# Patient Record
Sex: Male | Born: 1935 | Race: White | Hispanic: No | Marital: Married | Smoking: Former smoker
Health system: Southern US, Community
[De-identification: ages and names within clinical notes are randomized; demographics above are authoritative.]

## PROBLEM LIST (undated history)

## (undated) DIAGNOSIS — K219 Gastro-esophageal reflux disease without esophagitis: Secondary | ICD-10-CM

## (undated) DIAGNOSIS — H3530 Unspecified macular degeneration: Secondary | ICD-10-CM

## (undated) DIAGNOSIS — C3490 Malignant neoplasm of unspecified part of unspecified bronchus or lung: Secondary | ICD-10-CM

## (undated) DIAGNOSIS — F419 Anxiety disorder, unspecified: Secondary | ICD-10-CM

## (undated) DIAGNOSIS — IMO0001 Reserved for inherently not codable concepts without codable children: Secondary | ICD-10-CM

## (undated) DIAGNOSIS — C419 Malignant neoplasm of bone and articular cartilage, unspecified: Secondary | ICD-10-CM

## (undated) DIAGNOSIS — I10 Essential (primary) hypertension: Secondary | ICD-10-CM

## (undated) DIAGNOSIS — E119 Type 2 diabetes mellitus without complications: Secondary | ICD-10-CM

## (undated) HISTORY — DX: Essential (primary) hypertension: I10

---

## 1975-12-19 HISTORY — PX: VEIN LIGATION AND STRIPPING: SHX2653

## 2000-01-19 ENCOUNTER — Encounter: Admission: RE | Admit: 2000-01-19 | Discharge: 2000-01-19 | Payer: Self-pay | Admitting: Internal Medicine

## 2000-01-19 ENCOUNTER — Encounter: Payer: Self-pay | Admitting: Internal Medicine

## 2000-07-31 ENCOUNTER — Encounter: Admission: RE | Admit: 2000-07-31 | Discharge: 2000-07-31 | Payer: Self-pay | Admitting: Internal Medicine

## 2000-07-31 ENCOUNTER — Encounter: Payer: Self-pay | Admitting: Internal Medicine

## 2014-12-15 ENCOUNTER — Ambulatory Visit
Admission: RE | Admit: 2014-12-15 | Discharge: 2014-12-15 | Disposition: A | Discharge status: Home / Self Care | Payer: Medicare Other | Source: Ambulatory Visit | Attending: Family Medicine | Admitting: Family Medicine

## 2014-12-15 ENCOUNTER — Other Ambulatory Visit: Payer: Self-pay | Admitting: Family Medicine

## 2014-12-15 DIAGNOSIS — R918 Other nonspecific abnormal finding of lung field: Secondary | ICD-10-CM

## 2014-12-15 DIAGNOSIS — R042 Hemoptysis: Secondary | ICD-10-CM

## 2014-12-16 ENCOUNTER — Ambulatory Visit
Admission: RE | Admit: 2014-12-16 | Discharge: 2014-12-16 | Disposition: A | Discharge status: Home / Self Care | Payer: Medicare Other | Source: Ambulatory Visit | Attending: Family Medicine | Admitting: Family Medicine

## 2014-12-16 DIAGNOSIS — R918 Other nonspecific abnormal finding of lung field: Secondary | ICD-10-CM

## 2014-12-16 MED ORDER — IOHEXOL 300 MG/ML  SOLN
75.0000 mL | Freq: Once | INTRAMUSCULAR | Status: AC | PRN
Start: 2014-12-16 — End: 2014-12-16
  Administered 2014-12-16: 75 mL via INTRAVENOUS

## 2014-12-17 ENCOUNTER — Telehealth: Payer: Self-pay | Admitting: *Deleted

## 2014-12-17 ENCOUNTER — Encounter: Payer: Self-pay | Admitting: *Deleted

## 2014-12-17 NOTE — [facility] Oncology Navigator Note (Unsigned)
Received referral on Omar Peters today.  I called referring office to request notes and PET scan.  Patient is unaware of referral at this time.  I stated to receptionist that I will call patient on Monday and give their office to call him and order PET scan.  She verbalized understanding and will inform Dr. Rex Kras

## 2014-12-21 ENCOUNTER — Telehealth: Payer: Self-pay | Admitting: *Deleted

## 2014-12-21 ENCOUNTER — Encounter: Payer: Self-pay | Admitting: *Deleted

## 2014-12-21 ENCOUNTER — Other Ambulatory Visit: Payer: Self-pay | Admitting: *Deleted

## 2014-12-21 ENCOUNTER — Other Ambulatory Visit (HOSPITAL_COMMUNITY): Payer: Self-pay | Admitting: Family Medicine

## 2014-12-21 DIAGNOSIS — J984 Other disorders of lung: Secondary | ICD-10-CM | POA: Insufficient documentation

## 2014-12-21 DIAGNOSIS — C3411 Malignant neoplasm of upper lobe, right bronchus or lung: Secondary | ICD-10-CM

## 2014-12-21 NOTE — [facility] Oncology Navigator Note (Unsigned)
Called referring office again today.  I asked if they have spoken with the patient regarding referral, they have not.  I asked if PET scan was ordered.  Apparently, she fax order and percert number to our fax.  I spoke with Tiffany, she is looking for information.

## 2014-12-21 NOTE — Telephone Encounter (Signed)
Received fax from referring office for order for PET scan.  I went to radiology dept to schedule.  I was unable.  I called central scheduling to schedule.  I was able.  I faxed order to [id number] per their request.  I called patient and spoke with both Omar and [name].  I gave them appt time for PET and NPO 6 hours prior instructions.  PET arrival time is 10:45.  I also gave them an appt for the thoracic clinic on 12/31/14 arrival 12:15.  They both verbalized understanding of appt time and place.

## 2014-12-22 ENCOUNTER — Other Ambulatory Visit: Payer: Self-pay | Admitting: *Deleted

## 2014-12-22 DIAGNOSIS — J984 Other disorders of lung: Secondary | ICD-10-CM

## 2014-12-29 ENCOUNTER — Encounter: Payer: Self-pay | Admitting: *Deleted

## 2014-12-29 ENCOUNTER — Ambulatory Visit (HOSPITAL_COMMUNITY)
Admission: RE | Admit: 2014-12-29 | Discharge: 2014-12-29 | Disposition: A | Discharge status: Home / Self Care | Payer: Medicare Other | Source: Ambulatory Visit | Attending: Family Medicine | Admitting: Family Medicine

## 2014-12-29 ENCOUNTER — Telehealth: Payer: Self-pay | Admitting: *Deleted

## 2014-12-29 DIAGNOSIS — R059 Cough, unspecified: Secondary | ICD-10-CM | POA: Insufficient documentation

## 2014-12-29 DIAGNOSIS — C3411 Malignant neoplasm of upper lobe, right bronchus or lung: Secondary | ICD-10-CM

## 2014-12-29 DIAGNOSIS — R05 Cough: Secondary | ICD-10-CM | POA: Insufficient documentation

## 2014-12-29 DIAGNOSIS — C3491 Malignant neoplasm of unspecified part of right bronchus or lung: Secondary | ICD-10-CM | POA: Diagnosis not present

## 2014-12-29 DIAGNOSIS — R0602 Shortness of breath: Secondary | ICD-10-CM | POA: Insufficient documentation

## 2014-12-29 DIAGNOSIS — J90 Pleural effusion, not elsewhere classified: Secondary | ICD-10-CM | POA: Insufficient documentation

## 2014-12-29 DIAGNOSIS — C7951 Secondary malignant neoplasm of bone: Secondary | ICD-10-CM | POA: Insufficient documentation

## 2014-12-29 LAB — GLUCOSE, CAPILLARY: GLUCOSE-CAPILLARY: 214 mg/dL — AB (ref 70–99)

## 2014-12-29 MED ORDER — FLUDEOXYGLUCOSE F - 18 (FDG) INJECTION
8.3700 | Freq: Once | INTRAVENOUS | Status: AC | PRN
  Administered 2014-12-29: 8.37 via INTRAVENOUS

## 2014-12-29 NOTE — Telephone Encounter (Signed)
I had requested records form referring office.  I only obtained one sheet of information.  I called back to referring office and still have not received any more information.  I called patient to get information for abstraction.  He and his wife were very helpful.  I reminded him of appt on Thursday.  Both patient and wife verbalized understanding of appt time and place.

## 2014-12-31 ENCOUNTER — Encounter: Payer: Self-pay | Admitting: Internal Medicine

## 2014-12-31 ENCOUNTER — Encounter: Payer: Self-pay | Admitting: *Deleted

## 2014-12-31 ENCOUNTER — Ambulatory Visit
Admission: RE | Admit: 2014-12-31 | Discharge: 2014-12-31 | Disposition: A | Discharge status: Home / Self Care | Payer: Medicare Other | Source: Ambulatory Visit | Attending: Radiation Oncology | Admitting: Radiation Oncology

## 2014-12-31 ENCOUNTER — Other Ambulatory Visit (HOSPITAL_BASED_OUTPATIENT_CLINIC_OR_DEPARTMENT_OTHER): Payer: Medicare Other

## 2014-12-31 ENCOUNTER — Other Ambulatory Visit: Payer: Self-pay | Admitting: *Deleted

## 2014-12-31 ENCOUNTER — Other Ambulatory Visit: Payer: Self-pay

## 2014-12-31 ENCOUNTER — Ambulatory Visit (HOSPITAL_BASED_OUTPATIENT_CLINIC_OR_DEPARTMENT_OTHER): Payer: Medicare Other | Admitting: Internal Medicine

## 2014-12-31 ENCOUNTER — Ambulatory Visit: Payer: Medicare Other | Admitting: Physical Therapy

## 2014-12-31 ENCOUNTER — Ambulatory Visit (INDEPENDENT_AMBULATORY_CARE_PROVIDER_SITE_OTHER): Payer: Medicare Other | Admitting: Cardiothoracic Surgery

## 2014-12-31 VITALS — BP 143/63 | HR 80 | Temp 97.9°F | Resp 18 | Wt 171.1 lb

## 2014-12-31 VITALS — BP 143/63 | HR 80 | Temp 97.9°F | Resp 18 | Ht 66.0 in | Wt 171.1 lb

## 2014-12-31 DIAGNOSIS — J984 Other disorders of lung: Secondary | ICD-10-CM

## 2014-12-31 DIAGNOSIS — R918 Other nonspecific abnormal finding of lung field: Secondary | ICD-10-CM

## 2014-12-31 DIAGNOSIS — J90 Pleural effusion, not elsewhere classified: Secondary | ICD-10-CM

## 2014-12-31 DIAGNOSIS — C3411 Malignant neoplasm of upper lobe, right bronchus or lung: Secondary | ICD-10-CM

## 2014-12-31 DIAGNOSIS — C7951 Secondary malignant neoplasm of bone: Secondary | ICD-10-CM

## 2014-12-31 DIAGNOSIS — R1909 Other intra-abdominal and pelvic swelling, mass and lump: Secondary | ICD-10-CM

## 2014-12-31 DIAGNOSIS — I10 Essential (primary) hypertension: Secondary | ICD-10-CM

## 2014-12-31 LAB — COMPREHENSIVE METABOLIC PANEL (CC13)
ALT: 35 U/L (ref 0–55)
AST: 23 U/L (ref 5–34)
Albumin: 3.3 g/dL — ABNORMAL LOW (ref 3.5–5.0)
Alkaline Phosphatase: 96 U/L (ref 40–150)
Anion Gap: 9 mEq/L (ref 3–11)
BILIRUBIN TOTAL: 0.66 mg/dL (ref 0.20–1.20)
BUN: 16.4 mg/dL (ref 7.0–26.0)
CO2: 30 meq/L — AB (ref 22–29)
CREATININE: 1.1 mg/dL (ref 0.7–1.3)
Calcium: 9.1 mg/dL (ref 8.4–10.4)
Chloride: 98 mEq/L (ref 98–109)
EGFR: 65 mL/min/{1.73_m2} — ABNORMAL LOW (ref >90)
GLUCOSE: 240 mg/dL — AB (ref 70–140)
Potassium: 3.7 mEq/L (ref 3.5–5.1)
Sodium: 138 mEq/L (ref 136–145)
Total Protein: 6.9 g/dL (ref 6.4–8.3)

## 2014-12-31 LAB — CBC WITH DIFFERENTIAL/PLATELET
BASO%: 0.9 % (ref 0.0–2.0)
Basophils Absolute: 0.1 10*3/uL (ref 0.0–0.1)
EOS%: 1 % (ref 0.0–7.0)
Eosinophils Absolute: 0.1 10*3/uL (ref 0.0–0.5)
HCT: 49.9 % (ref 38.4–49.9)
HGB: 16.6 g/dL (ref 13.0–17.1)
LYMPH#: 2 10*3/uL (ref 0.9–3.3)
LYMPH%: 19 % (ref 14.0–49.0)
MCH: 34.5 pg — ABNORMAL HIGH (ref 27.2–33.4)
MCHC: 33.3 g/dL (ref 32.0–36.0)
MCV: 103.5 fL — ABNORMAL HIGH (ref 79.3–98.0)
MONO#: 1.2 10*3/uL — ABNORMAL HIGH (ref 0.1–0.9)
MONO%: 11.8 % (ref 0.0–14.0)
NEUT%: 67.3 % (ref 39.0–75.0)
NEUTROS ABS: 6.9 10*3/uL — AB (ref 1.5–6.5)
Platelets: 239 10*3/uL (ref 140–400)
RBC: 4.82 10*6/uL (ref 4.20–5.82)
RDW: 12.4 % (ref 11.0–14.6)
WBC: 10.3 10*3/uL (ref 4.0–10.3)

## 2014-12-31 NOTE — Progress Notes (Signed)
[facility] Telephone:(336) [phone]   Fax:(336) [phone] Multidisciplinary thoracic oncology clinic  CONSULT NOTE  REFERRING PHYSICIAN: Dr. Tamsen Roers.  REASON FOR CONSULTATION:  80 years old white male with questionable metastatic lung cancer  HPI Omar Peters is a 80 y.o. male with past medical history significant for hypertension and macular degeneration as well as long history of smoking but quit 3 weeks ago. The patient mentions that he started having cough for spells 3 weeks ago and pain in the chest after the cough as well as blood tinged sputum. He was seen by his primary care physician Dr. Rex Kras and chest x-ray was performed on 12/15/2014. It showed right hilar mass and/or infiltrate with possible mediastinal adenopathy.Right base atelectasis. Prominent right apical pleural thickening. These findings together are worrisome for malignancy and contrast-enhanced chest CT suggested to further evaluate. CT scan of the chest with contrast was performed on 12/16/2014 and it showed Large right upper lobe mass in the perihilar region contiguous with adjacent right hilar lymphadenopathy. This mass is difficult to discretely measure separate from the adjacent postobstructive lung consolidation/collapse, but is estimated to measure approximately 9.9 x 6.2 x 7.8 cm. there was also Right hilar lymphadenopathy contiguous with the right upper lobe mass. Right paratracheal lymphadenopathy measuring up to 12 mm in short axis in the lower right paratracheal station. Subcarinal lymphadenopathy measuring up to 12 mm in short axis. Right-sided superior mediastinal lymph node lateral to the proximal esophagus measuring 11 mm in short axis. AP window lymph node measuring 11 mm in short axis. These findings were highly concerning for primary right upper lobe bronchogenic carcinoma with right hilar and bilateral mediastinal lymphadenopathy as well as probable malignant right-sided pleural  effusion. A PET scan was performed on 12/29/2014 and it showed hypermetabolic right upper lobe mass consistent with primary bronchogenic carcinoma in addition to hypermetabolic mediastinal nodal metastasis and large right pleural effusion in addition to hypermetabolic skeletal metastases involving the right scapula and right iliac bone.  Dr. Rex Kras kindly referred the patient to me today for evaluation and recommendation regarding his condition.  When seen today he continues to have cough productive of whitish sputum as well as shortness of breath at baseline and increased with exertion. He also complains of right-sided chest pain. No significant weight loss or night sweats. The patient denied having any nausea or vomiting, diarrhea or constipation. He denied having any headache or visual changes. Family history significant for mother who died at age 27 with a stroke and father died at age 13 with pancreatic cancer. The patient had to Brother with lung cancer one died at age 85 and the other one at age 76. The patient is married and has 4 children. He was accompanied today by his wife during. 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 The patient has a history of smoking more than one pack per day for around 50 years but quit 3 weeks ago. He also drinks alcohol on daily basis. No history of drug abuse.  HPI  Past Medical History  Diagnosis Date  . Hypertension     History reviewed. No pertinent past surgical history.  Family History  Problem Relation Age of Onset  . Stroke Mother   . Cancer Father   . Cancer Brother     Social History History  Substance Use Topics  . Smoking status: Former Smoker -- 1.00 packs/day for 50 years    Quit date: 12/14/2014  .  Smokeless tobacco: Former Systems developer    Quit date: 12/14/2014  . Alcohol Use: Not on file    No Known Allergies  Current Outpatient Prescriptions  Medication Sig Dispense Refill  . amLODipine (NORVASC) 10 MG  tablet Take 10 mg by mouth daily.    Marland Kitchen aspirin 81 MG tablet Take 81 mg by mouth daily.    . cholecalciferol (VITAMIN D) 1000 UNITS tablet Take 1,000 Units by mouth daily.    Marland Kitchen dextromethorphan (DELSYM) 30 MG/5ML liquid Take 15 mg by mouth as needed for cough.    . doxycycline (ADOXA) 50 MG tablet Take 50 mg by mouth 2 (two) times daily.    Marland Kitchen losartan-hydrochlorothiazide (HYZAAR) 100-25 MG per tablet Take 1 tablet by mouth daily.    . metoprolol (LOPRESSOR) 50 MG tablet Take 50 mg by mouth 2 (two) times daily.    . Multiple Vitamins-Minerals (PRESERVISION AREDS PO) Take 1 tablet by mouth daily.    . ranitidine (ZANTAC) 150 MG tablet Take 150 mg by mouth daily.    . vitamin C (ASCORBIC ACID) 500 MG tablet Take 500 mg by mouth daily.     No current facility-administered medications for this visit.    Review of Systems  Constitutional: positive for fatigue Eyes: negative Ears, nose, mouth, throat, and face: negative Respiratory: positive for cough, dyspnea on exertion and pleurisy/chest pain Cardiovascular: negative Gastrointestinal: negative Genitourinary:negative Integument/breast: negative Hematologic/lymphatic: negative Musculoskeletal:negative Neurological: negative Behavioral/Psych: negative Endocrine: negative Allergic/Immunologic: negative  Physical Exam  [id number], healthy, no distress, well nourished, well developed and anxious SKIN: skin color, texture, turgor are normal, no rashes or significant lesions HEAD: Normocephalic, No masses, lesions, tenderness or abnormalities EYES: normal, PERRLA EARS: External ears normal, Canals clear OROPHARYNX:no exudate, no erythema and lips, buccal mucosa, and tongue normal  NECK: supple, no adenopathy, no JVD LYMPH:  no palpable lymphadenopathy, no hepatosplenomegaly LUNGS: decreased breath sounds, expiratory wheezes bilaterally HEART: regular rate & rhythm, no murmurs and no gallops ABDOMEN:abdomen soft, non-tender, normal bowel  sounds and no masses or organomegaly BACK: Back symmetric, no curvature., No CVA tenderness EXTREMITIES:no joint deformities, effusion, or inflammation, no edema, no skin discoloration  NEURO: alert & oriented x 3 with fluent speech, no focal motor/sensory deficits  PERFORMANCE STATUS: ECOG 1  LABORATORY DATA: Lab Results  Component Value Date   WBC 10.3 12/31/2014   HGB 16.6 12/31/2014   HCT 49.9 12/31/2014   MCV 103.5* 12/31/2014   PLT 239 12/31/2014      Chemistry      Component Value Date/Time   NA 138 12/31/2014 1229   K 3.7 12/31/2014 1229   CO2 30* 12/31/2014 1229   BUN 16.4 12/31/2014 1229   CREATININE 1.1 12/31/2014 1229      Component Value Date/Time   CALCIUM 9.1 12/31/2014 1229   ALKPHOS 96 12/31/2014 1229   AST 23 12/31/2014 1229   ALT 35 12/31/2014 1229   BILITOT 0.66 12/31/2014 1229       RADIOGRAPHIC STUDIES: Dg Chest 2 View  12/15/2014   CLINICAL DATA:  Shortness of breath and cough.  EXAM: CHEST  2 VIEW  COMPARISON:  None.  FINDINGS: Right perihilar mass and/or infiltrate. Associated prominence of the mediastinum suggesting adenopathy. Right base atelectasis. Right apical pleural thickening. These findings together worrisome for malignancy. Contrast-enhanced chest CT suggested to further evaluate. Left lung clear. Heart size normal. No acute bony abnormality.  IMPRESSION: Right perihilar mass and or infiltrate with possible mediastinal adenopathy. Right base atelectasis. Prominent right apical pleural  thickening. These findings together are worrisome for malignancy and contrast-enhanced chest CT suggested to further evaluate. These results will be called to the ordering clinician or representative by the Radiologist Assistant, and communication documented in the PACS or zVision Dashboard.   Electronically Signed   By: Marcello Moores  Register   On: 12/15/2014 13:20   Ct Chest W Contrast  12/16/2014   CLINICAL DATA:  80 year old male with right perihilar mass seen  on chest x-ray. Cough and hemoptysis for the past 10 days. Prior history of smoking, quit 3 weeks ago.  EXAM: CT CHEST WITH CONTRAST  TECHNIQUE: Multidetector CT imaging of the chest was performed during intravenous contrast administration.  CONTRAST:  11mL OMNIPAQUE IOHEXOL 300 MG/ML  SOLN  COMPARISON:  Chest x-ray 12/15/2014.  FINDINGS: BUN and creatinine were obtained on site at [facility] at  [address]  Results:  BUN 14 mg/dL,  Creatinine 0.8 mg/dL.  Mediastinum: Right hilar lymphadenopathy contiguous with the right upper lobe mass. Right paratracheal lymphadenopathy measuring up to 12 mm in short axis in the lower right paratracheal station. Subcarinal lymphadenopathy measuring up to 12 mm in short axis. Right-sided superior mediastinal lymph node lateral to the proximal esophagus measuring 11 mm in short axis. AP window lymph node measuring 11 mm in short axis. No left hilar adenopathy. Heart size is normal. There is no significant pericardial fluid, thickening or pericardial calcification. There is atherosclerosis of the thoracic aorta, the great vessels of the mediastinum and the coronary arteries, including calcified atherosclerotic plaque in the left anterior descending and right coronary arteries.  Lungs/Pleura: Large right upper lobe mass in the perihilar region contiguous with adjacent right hilar lymphadenopathy. This mass is difficult to discretely measure separate from the adjacent postobstructive lung consolidation/collapse, but is estimated to measure approximately 9.9 x 6.2 x 7.8 cm (images 23 of series 2 and coronal image 64). Abnormal nodal tissue is also noted around the right middle lobe bronchi, with some mild postobstructive changes in the right middle lobe and to a lesser extent in the right lower lobe. No definite direct mediastinal invasion noted at this time. Large right-sided pleural effusion, predominantly layering dependently, although there is likely a partially  loculated component in the right apex adjacent to the right upper lobe mass. Background of diffuse bronchial wall thickening with moderate centrilobular and mild paraseptal emphysema.  Upper Abdomen: Shrunken appearance and nodular contour in the liver, with heterogeneous attenuation throughout the parenchyma, compatible with cirrhosis. Atherosclerosis.  Musculoskeletal: There are no aggressive appearing lytic or blastic lesions noted in the visualized portions of the skeleton.  IMPRESSION: 1. Findings, as above, highly concerning for primary right upper lobe bronchogenic carcinoma with right hilar and bilateral mediastinal lymphadenopathy, as well as a probable malignant right-sided pleural effusion. Based on these findings, this is compatible with at least T3, N3, Mx disease (i.e., at least stage IIIB). Correlation with PET-CT and/or biopsy is recommended for further diagnostic and staging purposes. 2. Additional incidental findings, as above. These results will be called to the ordering clinician or representative by the Radiologist Assistant, and communication documented in the PACS or zVision Dashboard.   Electronically Signed   By: Vinnie Langton M.D.   On: 12/16/2014 14:28   Nm Pet Image Initial (pi) Skull Base To Thigh  12/29/2014   CLINICAL DATA:  Initial treatment strategy for right lung mass with mediastinal lymphadenopathy.  EXAM: NUCLEAR MEDICINE PET SKULL BASE TO THIGH  TECHNIQUE: 8.1 mCi F-18 FDG was injected intravenously.  Full-ring PET imaging was performed from the skull base to thigh after the radiotracer. CT data was obtained and used for attenuation correction and anatomic localization.  FASTING BLOOD GLUCOSE:  Value: 214 mg/dl  COMPARISON:  None.  FINDINGS: NECK  No hypermetabolic lymph nodes in the neck.  CHEST  Ill-defined right upper lobe pulmonary mass approximately 5.5 cm with SUV max 4.7. The mass is difficult to define on the background of collapse of the upper lobe. Additionally  there is a large right pleural effusion occupying greater than 50% of the right hemithorax volume.  Hypermetabolic mediastinal lymph nodes involving the right lower paratracheal, subcarinal, and high right paratracheal nodal stations. Example subcarinal lymph node with SUV max 4.8. Small high right paratracheal lymph node with SUV max 3.2. The left lung is clear.  ABDOMEN/PELVIS  No abnormal hypermetabolic activity within the liver, pancreas, adrenal glands, or spleen. No hypermetabolic lymph nodes in the abdomen or pelvis.  SKELETON  There is a expansile lytic lesion within the right posterior iliac bone adjacent the SI joint measuring 3.1 x 2.6 cm and with SUV max 5.4. A second hypermetabolic lytic lesion within the right scapula just posterior to the glenoid fossa (image 55).  IMPRESSION: 1. Hypermetabolic right upper lobe mass consistent with primary bronchogenic carcinoma. 2. Hypermetabolic mediastinal nodal metastasis. 3. Large right pleural effusion. 4. Hypermetabolic skeletal metastasis involving the right scapula and right iliac bone.   Electronically Signed   By: Suzy Bouchard M.D.   On: 12/29/2014 13:11    ASSESSMENT: This is a very pleasant 80 years old white male with highly suspicious metastatic lung cancer, pending tissue diagnosis presented with large right upper lobe lung mass in addition to right hilar and bilateral mediastinal lymphadenopathy as well as a right pleural effusion and metastatic bone disease.   PLAN: I had a lengthy discussion with the patient and his wife today about his current disease stage and possible treatment options. I discussed the option of obtaining tissue diagnosis for this patient including bronchoscopy versus CT-guided core biopsy by interventional radiology. The patient will be seen later today by Dr. Servando Snare for the final decision regarding the best option for his biopsy. He will also be seen by Dr. Servando Snare for management of the right pleural effusion. I  will complete the staging workup by ordering a MRI of the brain to rule out brain metastasis. I will see the patient back for follow-up visit in this in 2 weeks for reevaluation and more detailed discussion of his treatment options based on the final pathology. He will also see [name] later today for evaluation and consideration of palliative radiotherapy to the metastatic bone lesion. The patient was seen during his multidisciplinary thoracic oncology clinic today by medical oncology, radiation oncology, thoracic surgery, thoracic navigator, physical therapist as well as oncology pharmacist. For hypertension he will continue with his current medication with Hyzaar, Norvasc and Lopressor. The patient was advised to call immediately if he has any concerning symptoms in the interval.  The patient voices understanding of current disease status and treatment options and is in agreement with the current care plan.  All questions were answered. The patient knows to call the clinic with any problems, questions or concerns. We can certainly see the patient much sooner if necessary.  Thank you so much for allowing me to participate in the care of Omar Peters. I will continue to follow up the patient with you and assist in his care.  I spent 40  minutes counseling the patient face to face. The total time spent in the appointment was 60 minutes.  Disclaimer: This note was dictated with voice recognition software. Similar sounding words can inadvertently be transcribed and may not be corrected upon review.   Oluwanifemi Petitti K. 12/31/2014, 1:49 PM

## 2014-12-31 NOTE — Progress Notes (Signed)
   Thoracic Treatment Summary Name:Omar Peters Date: 12/31/2014 DOB:[birth year] Your Medical Team Medical Oncologist:Dr. Julien Nordmann Radiation Oncologist:[name] Pulmonologist:  Surgeon:Dr.Gerhardt Type and Stage of Lung Cancer  Unknown histology as of 12/31/2014  Clinical Stage: Stage IV No matching staging information was found for the patient.   Clinical stage is based on radiology exams.  Pathological stage will be determined after surgery.  Staging is based on the size of the tumor, involvement of lymph nodes or not, and whether or not the cancer center has spread. Recommendations Recommendations: Tissue diagnosis, chemo and radiation therapy  These recommendations are based on information available as of today's consult.  This is subject to change depending further testing or exams. Next Steps Next Step: Medical Oncology will set up follow up appointments  Radiation Oncology will call and set up follow up appointments Thoracic Surgery office will call and set up follow up appointments Barriers to Care What do you perceive as a potential barrier that may prevent you from receiving your treatment plan? Education lung cancer information given and explained Support resources at [employer] given and explained Estate manager/land agent spoke with patient to see if patient qualifies for services.  I help patient fill out application and faxed to lung cancer initiative.    Resources Given: NCI Booklet on [employer] at [employer].org [phone] What to expect at [facility] information   Questions Norton Blizzard, RN BSN Thoracic Oncology Nurse Navigator at [facility] is a nurse navigator that is available to assist you through your cancer journey.  She can answer your questions and/or provide resources regarding your treatment plan, emotional support, or financial concerns.

## 2014-12-31 NOTE — Progress Notes (Signed)
Spoke w/ pt with financial concerns.  I informed pt that if his ins doesn't pay 100% for chemo I will contact foundations that offer copay assistance.  I also informed him of the $400 [place] and discussed what the grant will cover.  He will bring me his bank statement to see if he will qualify for that grant.  I gave him my card for any additional billing or insurance questions or concerns.

## 2014-12-31 NOTE — Progress Notes (Signed)
Radiation Oncology         [phone] ________________________________  Multidisciplinary Thoracic Oncology Clinic [facility]) Initial Outpatient Consultation  Name: SARA SELVIDGE MRN: [id number]  Date: 12/31/2014  DOB: 04-30-1935  RS:WNIOEV,OJJKK, MD  Rigoberto Noel, MD   REFERRING PHYSICIAN: Rigoberto Noel, MD  DIAGNOSIS: The primary encounter diagnosis was Bone metastasis. A diagnosis of Cancer of upper lobe of right lung was also pertinent to this visit.    ICD-9-CM ICD-10-CM   1. Bone metastasis 198.5 C79.51   2. Cancer of upper lobe of right lung 162.3 C34.11     HISTORY OF PRESENT ILLNESS::Lamorris D Sheridan is a 80 y.o. male who presented with cough and dyspnea.  Chest x-ray on 12/15/14 showed a right perihilar mass and or infiltrate with possible mediastinal adenopathy     Chest CT on 12/16/14 showed Large right upper lobe mass in the perihilar region contiguous with adjacent right hilar lymphadenopathy. This mass is difficult to discretely measure separate from the adjacent postobstructive lung consolidation/collapse, but is estimated to measure approximately 9.9 x 6.2 x 7.8 cm     PET/CT on 12/29/14 showed a hypermetabolic right upper lobe mass, hypermetabolic mediastinal nodal metastasis, a large right pleural effusion, and hypermetabolic skeletal metastasis involving the right scapula and right iliac bone.    He was referred to the [facility] today for further recommendations.  PREVIOUS RADIATION THERAPY: No  PAST MEDICAL HISTORY:  has a past medical history of Hypertension.    PAST SURGICAL HISTORY:No past surgical history on file.  FAMILY HISTORY: family history includes Cancer in his brother and father; Stroke in his mother.  SOCIAL HISTORY:  reports that he quit smoking about 2 weeks ago. He quit smokeless tobacco use about 2 weeks ago. He reports that he does not use illicit drugs.  ALLERGIES: Review of patient's allergies indicates no known  allergies.  MEDICATIONS:  Current Outpatient Prescriptions  Medication Sig Dispense Refill  . amLODipine (NORVASC) 10 MG tablet Take 10 mg by mouth daily.    Marland Kitchen aspirin 81 MG tablet Take 81 mg by mouth daily.    . cholecalciferol (VITAMIN D) 1000 UNITS tablet Take 1,000 Units by mouth daily.    Marland Kitchen dextromethorphan (DELSYM) 30 MG/5ML liquid Take 15 mg by mouth as needed for cough.    . doxycycline (ADOXA) 50 MG tablet Take 50 mg by mouth 2 (two) times daily.    Marland Kitchen losartan-hydrochlorothiazide (HYZAAR) 100-25 MG per tablet Take 1 tablet by mouth daily.    . metoprolol (LOPRESSOR) 50 MG tablet Take 50 mg by mouth 2 (two) times daily.    . Multiple Vitamins-Minerals (PRESERVISION AREDS PO) Take 1 tablet by mouth daily.    . ranitidine (ZANTAC) 150 MG tablet Take 150 mg by mouth daily.    . vitamin C (ASCORBIC ACID) 500 MG tablet Take 500 mg by mouth daily.     No current facility-administered medications for this encounter.    REVIEW OF SYSTEMS:  A 15 point review of systems is documented in the electronic medical record. This was obtained by the nursing staff. However, I reviewed this with the patient to discuss relevant findings and make appropriate changes.  A comprehensive review of systems was negative.   PHYSICAL EXAM:  BP 143/63 mmHg  Pulse 80  Temp(Src) 97.9 F (36.6 C)  Resp 18  Wt 171 lb 1.6 oz (77.61 kg)  SpO2 94% Per thoracic surgery: General appearance: alert and cooperative Neurologic: intact Heart: regular rate and rhythm,  S1, S2 normal, no murmur, click, rub or gallop Lungs: diminished breath sounds RUL, absent breath sounds at the right base Abdomen: soft, non-tender; bowel sounds normal; no masses, no organomegaly Extremities: extremities normal, atraumatic, no cyanosis or edema and Homans sign is negative, no sign of DVT Patient has no cervical supraclavicular axillary adenopathy He has no carotid bruits , distal pulses 2 DP PT bilaterally are 2+   KPS = 80  100 -  Normal; no complaints; no evidence of disease. 90   - Able to carry on normal activity; minor signs or symptoms of disease. 80   - Normal activity with effort; some signs or symptoms of disease. 33   - Cares for self; unable to carry on normal activity or to do active work. 60   - Requires occasional assistance, but is able to care for most of his personal needs. 50   - Requires considerable assistance and frequent medical care. 49   - Disabled; requires special care and assistance. 24   - Severely disabled; hospital admission is indicated although death not imminent. 92   - Very sick; hospital admission necessary; active supportive treatment necessary. 10   - Moribund; fatal processes progressing rapidly. 0     - Dead  Karnofsky DA, Abelmann Luyando, Craver LS and Burchenal [facility] [phone]) The use of the nitrogen mustards in the palliative treatment of carcinoma: with particular reference to bronchogenic carcinoma Cancer 1 [id number]  LABORATORY DATA:  Lab Results  Component Value Date   WBC 10.3 12/31/2014   HGB 16.6 12/31/2014   HCT 49.9 12/31/2014   MCV 103.5* 12/31/2014   PLT 239 12/31/2014   Lab Results  Component Value Date   NA 138 12/31/2014   K 3.7 12/31/2014   CO2 30* 12/31/2014   Lab Results  Component Value Date   ALT 35 12/31/2014   AST 23 12/31/2014   ALKPHOS 96 12/31/2014   BILITOT 0.66 12/31/2014    PULMONARY FUNCTION TEST:  N/A   RADIOGRAPHY: Dg Chest 1 View  01/01/2015   CLINICAL DATA:  Initial encounter for right thoracentesis  EXAM: CHEST - 1 VIEW  COMPARISON:  12/15/2014  FINDINGS: Right pleural effusion is noted, potentially loculated. No evidence for pneumothorax status post thoracentesis. Collapse/consolidation noted in the right lower lung. Left lung is clear. Imaged bony structures of the thorax are intact.  IMPRESSION: No evidence for pneumothorax   Electronically Signed   By: Misty Stanley M.D.   On: 01/01/2015 13:53   Dg Chest 2 View  12/15/2014   CLINICAL  DATA:  Shortness of breath and cough.  EXAM: CHEST  2 VIEW  COMPARISON:  None.  FINDINGS: Right perihilar mass and/or infiltrate. Associated prominence of the mediastinum suggesting adenopathy. Right base atelectasis. Right apical pleural thickening. These findings together worrisome for malignancy. Contrast-enhanced chest CT suggested to further evaluate. Left lung clear. Heart size normal. No acute bony abnormality.  IMPRESSION: Right perihilar mass and or infiltrate with possible mediastinal adenopathy. Right base atelectasis. Prominent right apical pleural thickening. These findings together are worrisome for malignancy and contrast-enhanced chest CT suggested to further evaluate. These results will be called to the ordering clinician or representative by the Radiologist Assistant, and communication documented in the PACS or zVision Dashboard.   Electronically Signed   By: Marcello Moores  Register   On: 12/15/2014 13:20   Ct Chest W Contrast  12/16/2014   CLINICAL DATA:  80 year old male with right perihilar mass seen on chest x-ray. Cough and hemoptysis  for the past 10 days. Prior history of smoking, quit 3 weeks ago.  EXAM: CT CHEST WITH CONTRAST  TECHNIQUE: Multidetector CT imaging of the chest was performed during intravenous contrast administration.  CONTRAST:  68mL OMNIPAQUE IOHEXOL 300 MG/ML  SOLN  COMPARISON:  Chest x-ray 12/15/2014.  FINDINGS: BUN and creatinine were obtained on site at [facility] at  [address]  Results:  BUN 14 mg/dL,  Creatinine 0.8 mg/dL.  Mediastinum: Right hilar lymphadenopathy contiguous with the right upper lobe mass. Right paratracheal lymphadenopathy measuring up to 12 mm in short axis in the lower right paratracheal station. Subcarinal lymphadenopathy measuring up to 12 mm in short axis. Right-sided superior mediastinal lymph node lateral to the proximal esophagus measuring 11 mm in short axis. AP window lymph node measuring 11 mm in short axis. No left hilar  adenopathy. Heart size is normal. There is no significant pericardial fluid, thickening or pericardial calcification. There is atherosclerosis of the thoracic aorta, the great vessels of the mediastinum and the coronary arteries, including calcified atherosclerotic plaque in the left anterior descending and right coronary arteries.  Lungs/Pleura: Large right upper lobe mass in the perihilar region contiguous with adjacent right hilar lymphadenopathy. This mass is difficult to discretely measure separate from the adjacent postobstructive lung consolidation/collapse, but is estimated to measure approximately 9.9 x 6.2 x 7.8 cm (images 23 of series 2 and coronal image 64). Abnormal nodal tissue is also noted around the right middle lobe bronchi, with some mild postobstructive changes in the right middle lobe and to a lesser extent in the right lower lobe. No definite direct mediastinal invasion noted at this time. Large right-sided pleural effusion, predominantly layering dependently, although there is likely a partially loculated component in the right apex adjacent to the right upper lobe mass. Background of diffuse bronchial wall thickening with moderate centrilobular and mild paraseptal emphysema.  Upper Abdomen: Shrunken appearance and nodular contour in the liver, with heterogeneous attenuation throughout the parenchyma, compatible with cirrhosis. Atherosclerosis.  Musculoskeletal: There are no aggressive appearing lytic or blastic lesions noted in the visualized portions of the skeleton.  IMPRESSION: 1. Findings, as above, highly concerning for primary right upper lobe bronchogenic carcinoma with right hilar and bilateral mediastinal lymphadenopathy, as well as a probable malignant right-sided pleural effusion. Based on these findings, this is compatible with at least T3, N3, Mx disease (i.e., at least stage IIIB). Correlation with PET-CT and/or biopsy is recommended for further diagnostic and staging purposes.  2. Additional incidental findings, as above. These results will be called to the ordering clinician or representative by the Radiologist Assistant, and communication documented in the PACS or zVision Dashboard.   Electronically Signed   By: Vinnie Langton M.D.   On: 12/16/2014 14:28   Nm Pet Image Initial (pi) Skull Base To Thigh  12/29/2014   CLINICAL DATA:  Initial treatment strategy for right lung mass with mediastinal lymphadenopathy.  EXAM: NUCLEAR MEDICINE PET SKULL BASE TO THIGH  TECHNIQUE: 8.1 mCi F-18 FDG was injected intravenously. Full-ring PET imaging was performed from the skull base to thigh after the radiotracer. CT data was obtained and used for attenuation correction and anatomic localization.  FASTING BLOOD GLUCOSE:  Value: 214 mg/dl  COMPARISON:  None.  FINDINGS: NECK  No hypermetabolic lymph nodes in the neck.  CHEST  Ill-defined right upper lobe pulmonary mass approximately 5.5 cm with SUV max 4.7. The mass is difficult to define on the background of collapse of the upper lobe. Additionally  there is a large right pleural effusion occupying greater than 50% of the right hemithorax volume.  Hypermetabolic mediastinal lymph nodes involving the right lower paratracheal, subcarinal, and high right paratracheal nodal stations. Example subcarinal lymph node with SUV max 4.8. Small high right paratracheal lymph node with SUV max 3.2. The left lung is clear.  ABDOMEN/PELVIS  No abnormal hypermetabolic activity within the liver, pancreas, adrenal glands, or spleen. No hypermetabolic lymph nodes in the abdomen or pelvis.  SKELETON  There is a expansile lytic lesion within the right posterior iliac bone adjacent the SI joint measuring 3.1 x 2.6 cm and with SUV max 5.4. A second hypermetabolic lytic lesion within the right scapula just posterior to the glenoid fossa (image 55).  IMPRESSION: 1. Hypermetabolic right upper lobe mass consistent with primary bronchogenic carcinoma. 2. Hypermetabolic  mediastinal nodal metastasis. 3. Large right pleural effusion. 4. Hypermetabolic skeletal metastasis involving the right scapula and right iliac bone.   Electronically Signed   By: Suzy Bouchard M.D.   On: 12/29/2014 13:11   US Thoracentesis Asp Pleural Space W/img Guide  01/01/2015   INDICATION: Symptomatic right sided pleural effusion  EXAM: US THORACENTESIS ASP PLEURAL SPACE W/IMG GUIDE  COMPARISON:  PET 12/29/14.  MEDICATIONS: None  COMPLICATIONS: None immediate  TECHNIQUE: Informed written consent was obtained from the patient after a discussion of the risks, benefits and alternatives to treatment. A timeout was performed prior to the initiation of the procedure.  Initial ultrasound scanning demonstrates a right pleural effusion. The lower chest was prepped and draped in the usual sterile fashion. 1% lidocaine was used for local anesthesia.  Under direct ultrasound guidance, a 19 gauge, 7-cm, Yueh catheter was introduced. An ultrasound image was saved for documentation purposes. The thoracentesis was performed. The catheter was removed and a dressing was applied. The patient tolerated the procedure well without immediate post procedural complication. The patient was escorted to have an upright chest radiograph.  FINDINGS: A total of approximately 900 ml of serous fluid was removed. Requested samples were sent to the laboratory.  IMPRESSION: Successful ultrasound-guided right sided thoracentesis yielding 900 ml of pleural fluid.  Read By:  Tsosie Billing PA-C   Electronically Signed   By: Jacqulynn Cadet M.D.   On: 01/01/2015 14:23      IMPRESSION: This patient is very nice 80 year old gentleman with presumed stage IV primary lung cancer, pending tissue diagnosis. The patient may ultimately benefit from radiotherapy for his postobstructive lung consolidation or for his bone metastases.  PLAN:  The patient will need to proceed with thoracentesis as well as lung biopsy, and brain MRI. I will look  forward to following up with him once these are complete to discuss possible radiation treatment options.  Today, I talked to the patient and family about the findings and work-up thus far.  We discussed the natural history of metastatic lung cancer, in the event that the biopsy confirms this, and general treatment, highlighting the role of radiotherapy in the management.  We discussed the available radiation techniques, and focused on the details of logistics and delivery.  We reviewed the anticipated acute and late sequelae associated with radiation in this setting.  The patient was encouraged to ask questions that I answered to the best of my ability.  I filled out a patient counseling form during our discussion including treatment diagrams.  We retained a copy for our records.  The patient would like to proceed with radiation and will be scheduled for CT simulation.  I spent 30 minutes minutes face to face with the patient and more than 50% of that time was spent in counseling and/or coordination of care.   ------------------------------------------------  Sheral Apley. Tammi Klippel, M.D.

## 2015-01-01 ENCOUNTER — Telehealth: Payer: Self-pay | Admitting: Internal Medicine

## 2015-01-01 ENCOUNTER — Telehealth: Payer: Self-pay | Admitting: *Deleted

## 2015-01-01 ENCOUNTER — Encounter: Payer: Self-pay | Admitting: Cardiothoracic Surgery

## 2015-01-01 ENCOUNTER — Ambulatory Visit (HOSPITAL_COMMUNITY)
Admission: RE | Admit: 2015-01-01 | Discharge: 2015-01-01 | Disposition: A | Discharge status: Home / Self Care | Payer: Medicare Other | Source: Ambulatory Visit | Attending: Radiology | Admitting: Radiology

## 2015-01-01 ENCOUNTER — Ambulatory Visit (HOSPITAL_COMMUNITY)
Admission: RE | Admit: 2015-01-01 | Discharge: 2015-01-01 | Disposition: A | Discharge status: Home / Self Care | Payer: Medicare Other | Source: Ambulatory Visit | Attending: Cardiothoracic Surgery | Admitting: Cardiothoracic Surgery

## 2015-01-01 DIAGNOSIS — J90 Pleural effusion, not elsewhere classified: Secondary | ICD-10-CM | POA: Diagnosis present

## 2015-01-01 DIAGNOSIS — Z9889 Other specified postprocedural states: Secondary | ICD-10-CM

## 2015-01-01 NOTE — Telephone Encounter (Signed)
s.w pt wife and advised on Jan appts....Marland Kitchenok and aware

## 2015-01-01 NOTE — Progress Notes (Signed)
[address]       [place] [address]             [phone]                    Giordano D Hirschman Quentin Medical Record #[id number] Date of Birth: 05-24-1935  Referring: Curt Bears, MD Primary Care: Tamsen Roers, MD  Chief Complaint:   Lung mass   History of Present Illness:    Omar Peters 80 y.o. male is seen in the office  today for for what appears to be advanced stage IV carcinoma the lung but without definitive tissue diagnosis the patient has been a long-term smoker more than 50 years. 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 Several weeks ago he developed increasing cough with hemoptysis. This led to a chest x-ray and CT scan of the chest. A PET scan has been performed.  The patient notes that currently his breathing has improved he's had no further hemoptysis. He denies any bony pain.     Current Activity/ Functional Status:  Patient is independent with mobility/ambulation, transfers, ADL's, IADL's.   Zubrod Score: At the time of surgery this patient's most appropriate activity status/level should be described as: [x]     0    Normal activity, no symptoms []     1    Restricted in physical strenuous activity but ambulatory, able to do out light work []     2    Ambulatory and capable of self care, unable to do work activities, up and about               >50 % of waking hours                              []     3    Only limited self care, in bed greater than 50% of waking hours []     4    Completely disabled, no self care, confined to bed or chair []     5    Moribund   Past Medical History  Diagnosis Date  . Hypertension     No past surgical history on file.  Family History  Problem Relation Age of Onset  . Stroke Mother   . Cancer Father   . Cancer Brother     History   Social History  . Marital Status: Married    Spouse Name:  N/A    Number of Children: N/A  . Years of Education: N/A   Occupational History  .  work maintenance in [place] . Patient notes exposure to asbestos    Social History Main Topics  . Smoking status: Former Smoker -- 1.00 packs/day for 50 years    Quit date: 12/14/2014  . Smokeless tobacco: Former Systems developer    Quit date: 12/14/2014  . Alcohol Use: Not on file  . Drug Use: No  . Sexual Activity: Not on file     History  Smoking status  . Former Smoker -- 1.00 packs/day for 50 years  . Quit date: 12/14/2014  Smokeless tobacco  . Former Systems developer  . Quit date: 12/14/2014    History  Alcohol Use: Not on file     No Known Allergies  Current Outpatient Prescriptions  Medication Sig Dispense Refill  . amLODipine (  NORVASC) 10 MG tablet Take 10 mg by mouth daily.    Marland Kitchen aspirin 81 MG tablet Take 81 mg by mouth daily.    . cholecalciferol (VITAMIN D) 1000 UNITS tablet Take 1,000 Units by mouth daily.    Marland Kitchen dextromethorphan (DELSYM) 30 MG/5ML liquid Take 15 mg by mouth as needed for cough.    . doxycycline (ADOXA) 50 MG tablet Take 50 mg by mouth 2 (two) times daily.    Marland Kitchen losartan-hydrochlorothiazide (HYZAAR) 100-25 MG per tablet Take 1 tablet by mouth daily.    . metoprolol (LOPRESSOR) 50 MG tablet Take 50 mg by mouth 2 (two) times daily.    . Multiple Vitamins-Minerals (PRESERVISION AREDS PO) Take 1 tablet by mouth daily.    . ranitidine (ZANTAC) 150 MG tablet Take 150 mg by mouth daily.    . vitamin C (ASCORBIC ACID) 500 MG tablet Take 500 mg by mouth daily.     No current facility-administered medications for this visit.     Review of Systems:     Cardiac Review of Systems: Y or N  Chest Pain [  n]  Resting SOB [  n ] Exertional SOB  [ y ]  Orthopnea [ n ]   Pedal Edema [n  ]    Palpitations [ n ] Syncope  [  ]   Presyncope [ n  ]  General Review of Systems: [Y] = yes [  ]=no Constitional: recent weight change [n  ];  Wt loss over the last 3 months [   ]  anorexia [  ]; fatigue [ y ]; nausea [  ]; night sweats [  ]; fever [n  ]; or chills [n  ];          Dental: poor dentition[  ]; Last Dentist visit:   Eye : blurred vision [  ]; diplopia [   ]; vision changes [  ];  Amaurosis fugax[  ]; Resp: cough [ y ];  wheezing[ n ];  hemoptysis[y  ]; shortness of breath[y  ]; paroxysmal nocturnal dyspnea[ y ]; dyspnea on exertion[y  ]; or orthopnea[  ];  GI:  gallstones[  ], vomiting[n  ];  dysphagia[ n ]; melena[ n ];  hematochezia [n  ]; heartburn[  ];   Hx of  Colonoscopy[  ]; GU: kidney stones [  ]; hematuria[  ];   dysuria [  ];  nocturia[  ];  history of     obstruction [  ]; urinary frequency [ n ]             Skin: rash, swelling[  ];, hair loss[  ];  peripheral edema[  ];  or itching[  ]; Musculosketetal: myalgias[  ];  joint swelling[n  ];  joint erythema[ n ];  joint pain[ n ];  back pain[ n;  Heme/Lymph: bruising[  ];  bleeding[  ];  anemia[  ];  Neuro: TIA[  ];  headaches[  ];  stroke[  ];  vertigo[  ];  seizures[ n ];   paresthesias[  ];  difficulty walking[n  ];  Psych:depression[  ]; anxiety[  ];  Endocrine: diabetes[  ];  thyroid dysfunction[  ];  Immunizations: Flu up to date [ n]; Pneumococcal up to date [n  ];  Other:  Physical Exam: BP 143/63 mmHg  Pulse 80  Temp(Src) 97.9 F (36.6 C)  Resp 18  Wt 171 lb 1.6 oz (77.61 kg)  SpO2 94%  PHYSICAL EXAMINATION:  General appearance:  alert and cooperative Neurologic: intact Heart: regular rate and rhythm, S1, S2 normal, no murmur, click, rub or gallop Lungs: diminished breath sounds RUL, absent breath sounds at the right base Abdomen: soft, non-tender; bowel sounds normal; no masses,  no organomegaly Extremities: extremities normal, atraumatic, no cyanosis or edema and Homans sign is negative, no sign of DVT Patient has no cervical supraclavicular axillary adenopathy He has no carotid bruits , distal pulses 2 DP PT bilaterally are 2+    Diagnostic Studies & Laboratory data:      Recent Radiology Findings:   Dg Chest 2 View  12/15/2014   CLINICAL DATA:  Shortness of breath and cough.  EXAM: CHEST  2 VIEW  COMPARISON:  None.  FINDINGS: Right perihilar mass and/or infiltrate. Associated prominence of the mediastinum suggesting adenopathy. Right base atelectasis. Right apical pleural thickening. These findings together worrisome for malignancy. Contrast-enhanced chest CT suggested to further evaluate. Left lung clear. Heart size normal. No acute bony abnormality.  IMPRESSION: Right perihilar mass and or infiltrate with possible mediastinal adenopathy. Right base atelectasis. Prominent right apical pleural thickening. These findings together are worrisome for malignancy and contrast-enhanced chest CT suggested to further evaluate. These results will be called to the ordering clinician or representative by the Radiologist Assistant, and communication documented in the PACS or zVision Dashboard.   Electronically Signed   By: Marcello Moores  Register   On: 12/15/2014 13:20   Ct Chest W Contrast  12/16/2014   CLINICAL DATA:  80 year old male with right perihilar mass seen on chest x-ray. Cough and hemoptysis for the past 10 days. Prior history of smoking, quit 3 weeks ago.  EXAM: CT CHEST WITH CONTRAST  TECHNIQUE: Multidetector CT imaging of the chest was performed during intravenous contrast administration.  CONTRAST:  40mL OMNIPAQUE IOHEXOL 300 MG/ML  SOLN  COMPARISON:  Chest x-ray 12/15/2014.  FINDINGS: BUN and creatinine were obtained on site at [facility] [facility] at  [address]  Results:  BUN 14 mg/dL,  Creatinine 0.8 mg/dL.  Mediastinum: Right hilar lymphadenopathy contiguous with the right upper lobe mass. Right paratracheal lymphadenopathy measuring up to 12 mm in short axis in the lower right paratracheal station. Subcarinal lymphadenopathy measuring up to 12 mm in short axis. Right-sided superior mediastinal lymph node lateral to the proximal esophagus measuring 11 mm in short  axis. AP window lymph node measuring 11 mm in short axis. No left hilar adenopathy. Heart size is normal. There is no significant pericardial fluid, thickening or pericardial calcification. There is atherosclerosis of the thoracic aorta, the great vessels of the mediastinum and the coronary arteries, including calcified atherosclerotic plaque in the left anterior descending and right coronary arteries.  Lungs/Pleura: Large right upper lobe mass in the perihilar region contiguous with adjacent right hilar lymphadenopathy. This mass is difficult to discretely measure separate from the adjacent postobstructive lung consolidation/collapse, but is estimated to measure approximately 9.9 x 6.2 x 7.8 cm (images 23 of series 2 and coronal image 64). Abnormal nodal tissue is also noted around the right middle lobe bronchi, with some mild postobstructive changes in the right middle lobe and to a lesser extent in the right lower lobe. No definite direct mediastinal invasion noted at this time. Large right-sided pleural effusion, predominantly layering dependently, although there is likely a partially loculated component in the right apex adjacent to the right upper lobe mass. Background of diffuse bronchial wall thickening with moderate centrilobular and mild paraseptal emphysema.  Upper Abdomen: Shrunken appearance and nodular contour in  the liver, with heterogeneous attenuation throughout the parenchyma, compatible with cirrhosis. Atherosclerosis.  Musculoskeletal: There are no aggressive appearing lytic or blastic lesions noted in the visualized portions of the skeleton.  IMPRESSION: 1. Findings, as above, highly concerning for primary right upper lobe bronchogenic carcinoma with right hilar and bilateral mediastinal lymphadenopathy, as well as a probable malignant right-sided pleural effusion. Based on these findings, this is compatible with at least T3, N3, Mx disease (i.e., at least stage IIIB). Correlation with PET-CT  and/or biopsy is recommended for further diagnostic and staging purposes. 2. Additional incidental findings, as above. These results will be called to the ordering clinician or representative by the Radiologist Assistant, and communication documented in the PACS or zVision Dashboard.   Electronically Signed   By: Vinnie Langton M.D.   On: 12/16/2014 14:28   Nm Pet Image Initial (pi) Skull Base To Thigh  12/29/2014   CLINICAL DATA:  Initial treatment strategy for right lung mass with mediastinal lymphadenopathy.  EXAM: NUCLEAR MEDICINE PET SKULL BASE TO THIGH  TECHNIQUE: 8.1 mCi F-18 FDG was injected intravenously. Full-ring PET imaging was performed from the skull base to thigh after the radiotracer. CT data was obtained and used for attenuation correction and anatomic localization.  FASTING BLOOD GLUCOSE:  Value: 214 mg/dl  COMPARISON:  None.  FINDINGS: NECK  No hypermetabolic lymph nodes in the neck.  CHEST  Ill-defined right upper lobe pulmonary mass approximately 5.5 cm with SUV max 4.7. The mass is difficult to define on the background of collapse of the upper lobe. Additionally there is a large right pleural effusion occupying greater than 50% of the right hemithorax volume.  Hypermetabolic mediastinal lymph nodes involving the right lower paratracheal, subcarinal, and high right paratracheal nodal stations. Example subcarinal lymph node with SUV max 4.8. Small high right paratracheal lymph node with SUV max 3.2. The left lung is clear.  ABDOMEN/PELVIS  No abnormal hypermetabolic activity within the liver, pancreas, adrenal glands, or spleen. No hypermetabolic lymph nodes in the abdomen or pelvis.  SKELETON  There is a expansile lytic lesion within the right posterior iliac bone adjacent the SI joint measuring 3.1 x 2.6 cm and with SUV max 5.4. A second hypermetabolic lytic lesion within the right scapula just posterior to the glenoid fossa (image 55).  IMPRESSION: 1. Hypermetabolic right upper lobe mass  consistent with primary bronchogenic carcinoma. 2. Hypermetabolic mediastinal nodal metastasis. 3. Large right pleural effusion. 4. Hypermetabolic skeletal metastasis involving the right scapula and right iliac bone.   Electronically Signed   By: Suzy Bouchard M.D.   On: 12/29/2014 13:11      Recent Lab Findings: Lab Results  Component Value Date   WBC 10.3 12/31/2014   HGB 16.6 12/31/2014   HCT 49.9 12/31/2014   PLT 239 12/31/2014   GLUCOSE 240* 12/31/2014   ALT 35 12/31/2014   AST 23 12/31/2014   NA 138 12/31/2014   K 3.7 12/31/2014   CREATININE 1.1 12/31/2014   BUN 16.4 12/31/2014   CO2 30* 12/31/2014      Assessment / Plan:   Clinical stage IV carcinoma the right lung primarily involving the right upper lobe but with mediastinal involvement lung, with bony metastasis and a large right pleural effusion  Possibly malignant  I discussed with the patient and his wife the probable clinical diagnosis, I recommended that we proceed with needle core biopsy of the right iliac metastatic lesion which appears on scans to be total soft tissue replacement of the  bone and should not prove to be difficult for pathology and if appropriate genetic testing can be performed.  We'll proceed also with right thoracentesis to drain the right pleural space dry, the fluid will be sent for cytology. Patient be monitored for recurrence, if he has rapid recurrence of the right pleural fluid we can proceed with placement of Pleurx catheter.  Arrangements for needle biopsy and thoracentesis will be performed and I will see the patient back in the office next week with a follow-up chest x-ray, once the path results are confirmed the patient is ready to proceed with chemotherapy treatment as outlined by Dr. Inda Merlin.   I spent 55 minutes counseling the patient face to face. The total time spent in the appointment was 80 minutes.  Grace Isaac MD      [address] [place]  [address] Office [phone]   Beeper [phone]  01/01/2015 10:54 AM

## 2015-01-01 NOTE — Telephone Encounter (Signed)
Called to follow up from thoracic clinic yesterday.  I left my name and phone number to call if needed.

## 2015-01-01 NOTE — Telephone Encounter (Signed)
lvm for pt regarding to Jan appt....mailed pt appt sched/avs and letter

## 2015-01-01 NOTE — Procedures (Signed)
Successful US guided right thoracentesis. Yielded 900 ml of serous fluid. Pt tolerated procedure well. No immediate complications. Procedure stopped with remaining pleural fluid left behind secondary to patient's pain and coughing.  Specimen was sent for labs. CXR ordered.  Tsosie Billing D PA-C 01/01/2015 1:27 PM

## 2015-01-02 DIAGNOSIS — I10 Essential (primary) hypertension: Secondary | ICD-10-CM | POA: Insufficient documentation

## 2015-01-04 ENCOUNTER — Other Ambulatory Visit: Payer: Self-pay | Admitting: *Deleted

## 2015-01-04 ENCOUNTER — Ambulatory Visit (HOSPITAL_COMMUNITY): Payer: Medicare Other

## 2015-01-05 ENCOUNTER — Encounter (HOSPITAL_COMMUNITY): Payer: Self-pay | Admitting: Pharmacy Technician

## 2015-01-06 ENCOUNTER — Other Ambulatory Visit: Payer: Self-pay | Admitting: Radiology

## 2015-01-07 ENCOUNTER — Encounter (HOSPITAL_COMMUNITY): Payer: Self-pay

## 2015-01-07 ENCOUNTER — Ambulatory Visit (HOSPITAL_COMMUNITY)
Admission: RE | Admit: 2015-01-07 | Discharge: 2015-01-07 | Disposition: A | Discharge status: Home / Self Care | Payer: Medicare Other | Source: Ambulatory Visit | Attending: Cardiothoracic Surgery | Admitting: Cardiothoracic Surgery

## 2015-01-07 DIAGNOSIS — R1903 Right lower quadrant abdominal swelling, mass and lump: Secondary | ICD-10-CM | POA: Insufficient documentation

## 2015-01-07 DIAGNOSIS — R1909 Other intra-abdominal and pelvic swelling, mass and lump: Secondary | ICD-10-CM

## 2015-01-07 LAB — APTT: aPTT: 35 seconds (ref 24–37)

## 2015-01-07 LAB — CBC
HEMATOCRIT: 51.3 % (ref 39.0–52.0)
Hemoglobin: 18.2 g/dL — ABNORMAL HIGH (ref 13.0–17.0)
MCH: 35.4 pg — AB (ref 26.0–34.0)
MCHC: 35.5 g/dL (ref 30.0–36.0)
MCV: 99.8 fL (ref 78.0–100.0)
Platelets: 238 10*3/uL (ref 150–400)
RBC: 5.14 MIL/uL (ref 4.22–5.81)
RDW: 12.5 % (ref 11.5–15.5)
WBC: 9.8 10*3/uL (ref 4.0–10.5)

## 2015-01-07 LAB — PROTIME-INR
INR: 1.16 (ref 0.00–1.49)
Prothrombin Time: 15 seconds (ref 11.6–15.2)

## 2015-01-07 MED ORDER — FENTANYL CITRATE 0.05 MG/ML IJ SOLN
INTRAMUSCULAR | Status: AC | PRN
  Administered 2015-01-07: 50 ug via INTRAVENOUS

## 2015-01-07 MED ORDER — MIDAZOLAM HCL 2 MG/2ML IJ SOLN
INTRAMUSCULAR | Status: AC
  Filled 2015-01-07: qty 4

## 2015-01-07 MED ORDER — MIDAZOLAM HCL 2 MG/2ML IJ SOLN
INTRAMUSCULAR | Status: AC | PRN
  Administered 2015-01-07: 1 mg via INTRAVENOUS

## 2015-01-07 MED ORDER — FENTANYL CITRATE 0.05 MG/ML IJ SOLN
INTRAMUSCULAR | Status: AC
  Filled 2015-01-07: qty 4

## 2015-01-07 MED ORDER — SODIUM CHLORIDE 0.9 % IV SOLN
Freq: Once | INTRAVENOUS | Status: AC
  Administered 2015-01-07: 1000 mL via INTRAVENOUS

## 2015-01-07 MED ORDER — LIDOCAINE HCL 1 % IJ SOLN
INTRAMUSCULAR | Status: AC
  Filled 2015-01-07: qty 20

## 2015-01-07 NOTE — H&P (Signed)
Chief Complaint: New lung ca diagnosis R iliac lesion +PET  Referring Physician(s): Gerhardt,Edward B  History of Present Illness: Omar Peters is a 80 y.o. male  Long time hx smoker Developed cough and hemoptysis CXR abnormal CT scan reveals RUL mass +PET RUL mass; mediastinal LAN; R iliac lesion; R scapular lesion Thoracentesis 01/01/15 + adenocarcinoma Now scheduled for R iliac lesion for further genetic testing and tissue diagnosis   Past Medical History  Diagnosis Date  . Hypertension     History reviewed. No pertinent past surgical history.  Allergies: Review of patient's allergies indicates no known allergies.  Medications: Prior to Admission medications   Medication Sig Start Date End Date Taking? Authorizing Provider  amLODipine (NORVASC) 10 MG tablet Take 10 mg by mouth daily.   Yes Historical Provider, MD  aspirin 81 MG tablet Take 81 mg by mouth daily.   Yes Historical Provider, MD  doxycycline (ADOXA) 50 MG tablet Take 50 mg by mouth daily.    Yes Historical Provider, MD  losartan-hydrochlorothiazide (HYZAAR) 100-25 MG per tablet Take 1 tablet by mouth daily.   Yes Historical Provider, MD  metoprolol (LOPRESSOR) 50 MG tablet Take 50 mg by mouth daily.    Yes Historical Provider, MD  ranitidine (ZANTAC) 150 MG tablet Take 150 mg by mouth daily.   Yes Historical Provider, MD    Family History  Problem Relation Age of Onset  . Stroke Mother   . Cancer Father   . Cancer Brother     History   Social History  . Marital Status: Married    Spouse Name: N/A    Number of Children: N/A  . Years of Education: N/A   Social History Main Topics  . Smoking status: Former Smoker -- 1.00 packs/day for 50 years    Quit date: 12/14/2014  . Smokeless tobacco: Former Systems developer    Quit date: 12/14/2014  . Alcohol Use: None  . Drug Use: No  . Sexual Activity: None   Other Topics Concern  . None   Social History Narrative     Review of Systems: A 12 point  ROS discussed and pertinent positives are indicated in the HPI above.  All other systems are negative.  Review of Systems  Constitutional: Positive for fatigue. Negative for activity change.  Respiratory: Positive for cough and shortness of breath.   Cardiovascular: Positive for chest pain.  Gastrointestinal: Negative for abdominal pain.  Genitourinary: Negative for difficulty urinating.  Musculoskeletal:       Rt hip pain  Neurological: Positive for weakness.  Psychiatric/Behavioral: Negative for behavioral problems and confusion.     Vital Signs: BP 143/66 mmHg  Pulse 79  Temp(Src) 97.5 F (36.4 C) (Oral)  Resp 18  Ht 5\' 6"  (1.676 m)  Wt 77.111 kg (170 lb)  BMI 27.45 kg/m2  SpO2 92%  Physical Exam  Constitutional: He is oriented to person, place, and time.  Cardiovascular: Normal rate and regular rhythm.  Exam reveals no friction rub.   Pulmonary/Chest: Effort normal and breath sounds normal. He has no wheezes.  Abdominal: Soft. Bowel sounds are normal.  Musculoskeletal: Normal range of motion.  Neurological: He is alert and oriented to person, place, and time.  Skin: Skin is warm.  Psychiatric: He has a normal mood and affect. His behavior is normal. Judgment and thought content normal.    Imaging: Dg Chest 1 View  01/01/2015   CLINICAL DATA:  Initial encounter for right thoracentesis  EXAM: CHEST - 1  VIEW  COMPARISON:  12/15/2014  FINDINGS: Right pleural effusion is noted, potentially loculated. No evidence for pneumothorax status post thoracentesis. Collapse/consolidation noted in the right lower lung. Left lung is clear. Imaged bony structures of the thorax are intact.  IMPRESSION: No evidence for pneumothorax   Electronically Signed   By: Misty Stanley M.D.   On: 01/01/2015 13:53   Dg Chest 2 View  12/15/2014   CLINICAL DATA:  Shortness of breath and cough.  EXAM: CHEST  2 VIEW  COMPARISON:  None.  FINDINGS: Right perihilar mass and/or infiltrate. Associated  prominence of the mediastinum suggesting adenopathy. Right base atelectasis. Right apical pleural thickening. These findings together worrisome for malignancy. Contrast-enhanced chest CT suggested to further evaluate. Left lung clear. Heart size normal. No acute bony abnormality.  IMPRESSION: Right perihilar mass and or infiltrate with possible mediastinal adenopathy. Right base atelectasis. Prominent right apical pleural thickening. These findings together are worrisome for malignancy and contrast-enhanced chest CT suggested to further evaluate. These results will be called to the ordering clinician or representative by the Radiologist Assistant, and communication documented in the PACS or zVision Dashboard.   Electronically Signed   By: Marcello Moores  Register   On: 12/15/2014 13:20   Ct Chest W Contrast  12/16/2014   CLINICAL DATA:  80 year old male with right perihilar mass seen on chest x-ray. Cough and hemoptysis for the past 10 days. Prior history of smoking, quit 3 weeks ago.  EXAM: CT CHEST WITH CONTRAST  TECHNIQUE: Multidetector CT imaging of the chest was performed during intravenous contrast administration.  CONTRAST:  61mL OMNIPAQUE IOHEXOL 300 MG/ML  SOLN  COMPARISON:  Chest x-ray 12/15/2014.  FINDINGS: BUN and creatinine were obtained on site at [facility] at  [address]  Results:  BUN 14 mg/dL,  Creatinine 0.8 mg/dL.  Mediastinum: Right hilar lymphadenopathy contiguous with the right upper lobe mass. Right paratracheal lymphadenopathy measuring up to 12 mm in short axis in the lower right paratracheal station. Subcarinal lymphadenopathy measuring up to 12 mm in short axis. Right-sided superior mediastinal lymph node lateral to the proximal esophagus measuring 11 mm in short axis. AP window lymph node measuring 11 mm in short axis. No left hilar adenopathy. Heart size is normal. There is no significant pericardial fluid, thickening or pericardial calcification. There is atherosclerosis  of the thoracic aorta, the great vessels of the mediastinum and the coronary arteries, including calcified atherosclerotic plaque in the left anterior descending and right coronary arteries.  Lungs/Pleura: Large right upper lobe mass in the perihilar region contiguous with adjacent right hilar lymphadenopathy. This mass is difficult to discretely measure separate from the adjacent postobstructive lung consolidation/collapse, but is estimated to measure approximately 9.9 x 6.2 x 7.8 cm (images 23 of series 2 and coronal image 64). Abnormal nodal tissue is also noted around the right middle lobe bronchi, with some mild postobstructive changes in the right middle lobe and to a lesser extent in the right lower lobe. No definite direct mediastinal invasion noted at this time. Large right-sided pleural effusion, predominantly layering dependently, although there is likely a partially loculated component in the right apex adjacent to the right upper lobe mass. Background of diffuse bronchial wall thickening with moderate centrilobular and mild paraseptal emphysema.  Upper Abdomen: Shrunken appearance and nodular contour in the liver, with heterogeneous attenuation throughout the parenchyma, compatible with cirrhosis. Atherosclerosis.  Musculoskeletal: There are no aggressive appearing lytic or blastic lesions noted in the visualized portions of the skeleton.  IMPRESSION:  1. Findings, as above, highly concerning for primary right upper lobe bronchogenic carcinoma with right hilar and bilateral mediastinal lymphadenopathy, as well as a probable malignant right-sided pleural effusion. Based on these findings, this is compatible with at least T3, N3, Mx disease (i.e., at least stage IIIB). Correlation with PET-CT and/or biopsy is recommended for further diagnostic and staging purposes. 2. Additional incidental findings, as above. These results will be called to the ordering clinician or representative by the Radiologist  Assistant, and communication documented in the PACS or zVision Dashboard.   Electronically Signed   By: Vinnie Langton M.D.   On: 12/16/2014 14:28   Nm Pet Image Initial (pi) Skull Base To Thigh  12/29/2014   CLINICAL DATA:  Initial treatment strategy for right lung mass with mediastinal lymphadenopathy.  EXAM: NUCLEAR MEDICINE PET SKULL BASE TO THIGH  TECHNIQUE: 8.1 mCi F-18 FDG was injected intravenously. Full-ring PET imaging was performed from the skull base to thigh after the radiotracer. CT data was obtained and used for attenuation correction and anatomic localization.  FASTING BLOOD GLUCOSE:  Value: 214 mg/dl  COMPARISON:  None.  FINDINGS: NECK  No hypermetabolic lymph nodes in the neck.  CHEST  Ill-defined right upper lobe pulmonary mass approximately 5.5 cm with SUV max 4.7. The mass is difficult to define on the background of collapse of the upper lobe. Additionally there is a large right pleural effusion occupying greater than 50% of the right hemithorax volume.  Hypermetabolic mediastinal lymph nodes involving the right lower paratracheal, subcarinal, and high right paratracheal nodal stations. Example subcarinal lymph node with SUV max 4.8. Small high right paratracheal lymph node with SUV max 3.2. The left lung is clear.  ABDOMEN/PELVIS  No abnormal hypermetabolic activity within the liver, pancreas, adrenal glands, or spleen. No hypermetabolic lymph nodes in the abdomen or pelvis.  SKELETON  There is a expansile lytic lesion within the right posterior iliac bone adjacent the SI joint measuring 3.1 x 2.6 cm and with SUV max 5.4. A second hypermetabolic lytic lesion within the right scapula just posterior to the glenoid fossa (image 55).  IMPRESSION: 1. Hypermetabolic right upper lobe mass consistent with primary bronchogenic carcinoma. 2. Hypermetabolic mediastinal nodal metastasis. 3. Large right pleural effusion. 4. Hypermetabolic skeletal metastasis involving the right scapula and right iliac  bone.   Electronically Signed   By: Suzy Bouchard M.D.   On: 12/29/2014 13:11   US Thoracentesis Asp Pleural Space W/img Guide  01/01/2015   INDICATION: Symptomatic right sided pleural effusion  EXAM: US THORACENTESIS ASP PLEURAL SPACE W/IMG GUIDE  COMPARISON:  PET 12/29/14.  MEDICATIONS: None  COMPLICATIONS: None immediate  TECHNIQUE: Informed written consent was obtained from the patient after a discussion of the risks, benefits and alternatives to treatment. A timeout was performed prior to the initiation of the procedure.  Initial ultrasound scanning demonstrates a right pleural effusion. The lower chest was prepped and draped in the usual sterile fashion. 1% lidocaine was used for local anesthesia.  Under direct ultrasound guidance, a 19 gauge, 7-cm, Yueh catheter was introduced. An ultrasound image was saved for documentation purposes. The thoracentesis was performed. The catheter was removed and a dressing was applied. The patient tolerated the procedure well without immediate post procedural complication. The patient was escorted to have an upright chest radiograph.  FINDINGS: A total of approximately 900 ml of serous fluid was removed. Requested samples were sent to the laboratory.  IMPRESSION: Successful ultrasound-guided right sided thoracentesis yielding 900 ml of pleural  fluid.  Read By:  Tsosie Billing PA-C   Electronically Signed   By: Jacqulynn Cadet M.D.   On: 01/01/2015 14:23    Labs:  CBC:  Recent Labs  12/31/14 1229 01/07/15 0724  WBC 10.3 9.8  HGB 16.6 18.2*  HCT 49.9 51.3  PLT 239 238    COAGS:  Recent Labs  01/07/15 0724  INR 1.16  APTT 35    BMP:  Recent Labs  12/31/14 1229  NA 138  K 3.7  CO2 30*  GLUCOSE 240*  BUN 16.4  CALCIUM 9.1  CREATININE 1.1    LIVER FUNCTION TESTS:  Recent Labs  12/31/14 1229  BILITOT 0.66  AST 23  ALT 35  ALKPHOS 96  PROT 6.9  ALBUMIN 3.3*    TUMOR MARKERS: No results for input(s): AFPTM, CEA, CA199,  CHROMGRNA in the last 8760 hours.  Assessment and Plan:  New dx Lung cancer +PET R iliac lesion Now scheduled for Rt iliac lesion bx for tissue and genetic testing Pt aware of procedure benefits and risks and agreeable to proceed Consent signed andin chart   Thank you for this interesting consult.  I greatly enjoyed meeting Omar Peters and look forward to participating in their care.     I spent a total of 20 minutes face to face in clinical consultation, greater than 50% of which was counseling/coordinating care for R iliac lesion bx  Signed: Lorenz Donley A 01/07/2015, 9:01 AM

## 2015-01-07 NOTE — Procedures (Signed)
R iliac bone lesion Bx No comp

## 2015-01-07 NOTE — Discharge Instructions (Signed)
Bone Biopsy, Needle, Care After Read the instructions outlined below and refer to this sheet in the next few weeks. These discharge instructions provide you with general information on caring for yourself after you leave the hospital. Your caregiver may also give you specific instructions. While your treatment has been planned according to the most current medical practices available, unavoidable complications sometimes occur. If you have any problems or questions after discharge, call your caregiver. Finding out the results of your test Not all test results are available during your visit. If your test results are not back during the visit, make an appointment with your caregiver to find out the results. Do not assume everything is normal if you have not heard from your caregiver or the medical facility. It is important for you to follow up on all of your test results.  SEEK MEDICAL CARE IF:   You have redness, swelling, or increasing pain at the site of the biopsy.  You have pus coming from the biopsy site.  You have drainage from the biopsy site lasting longer than 1 day.  You notice a bad smell coming from the biopsy site or dressing.  You develop persistent nausea or vomiting. SEEK IMMEDIATE MEDICAL CARE IF:  You have a fever.  You develop a rash.  You have difficulty breathing.  You develop any reaction or side effects to medicines given. Document Released: 06/23/2005 Document Revised: 09/24/2013 Document Reviewed: 05/11/2009 [facility] Patient Information 2015 [place], [place]. This information is not intended to replace advice given to you by your health care provider. Make sure you discuss any questions you have with your health care provider.

## 2015-01-11 ENCOUNTER — Ambulatory Visit (HOSPITAL_COMMUNITY)
Admission: RE | Admit: 2015-01-11 | Discharge: 2015-01-11 | Disposition: A | Discharge status: Home / Self Care | Payer: Medicare Other | Source: Ambulatory Visit | Attending: Internal Medicine | Admitting: Internal Medicine

## 2015-01-11 DIAGNOSIS — C3411 Malignant neoplasm of upper lobe, right bronchus or lung: Secondary | ICD-10-CM | POA: Insufficient documentation

## 2015-01-11 MED ORDER — GADOBENATE DIMEGLUMINE 529 MG/ML IV SOLN
15.0000 mL | Freq: Once | INTRAVENOUS | Status: AC | PRN
  Administered 2015-01-11: 15 mL via INTRAVENOUS

## 2015-01-12 ENCOUNTER — Telehealth: Payer: Self-pay | Admitting: Internal Medicine

## 2015-01-12 ENCOUNTER — Other Ambulatory Visit: Payer: Medicare Other

## 2015-01-12 ENCOUNTER — Encounter: Payer: Self-pay | Admitting: Physician Assistant

## 2015-01-12 ENCOUNTER — Encounter: Payer: Self-pay | Admitting: *Deleted

## 2015-01-12 ENCOUNTER — Ambulatory Visit (HOSPITAL_BASED_OUTPATIENT_CLINIC_OR_DEPARTMENT_OTHER): Payer: Medicare Other | Admitting: Physician Assistant

## 2015-01-12 VITALS — BP 121/58 | HR 84 | Temp 97.7°F | Resp 18 | Ht 66.0 in | Wt 166.6 lb

## 2015-01-12 DIAGNOSIS — C3411 Malignant neoplasm of upper lobe, right bronchus or lung: Secondary | ICD-10-CM

## 2015-01-12 MED ORDER — CYANOCOBALAMIN 1000 MCG/ML IJ SOLN
1000.0000 ug | Freq: Once | INTRAMUSCULAR | Status: AC
Start: 2015-01-12 — End: 2015-01-12
  Administered 2015-01-12: 1000 ug via INTRAMUSCULAR

## 2015-01-12 MED ORDER — PROCHLORPERAZINE MALEATE 10 MG PO TABS: 10.0000 mg | ORAL_TABLET | Freq: Four times a day (QID) | ORAL | Status: DC | PRN

## 2015-01-12 MED ORDER — FLUCONAZOLE 100 MG PO TABS: ORAL_TABLET | ORAL | Status: DC

## 2015-01-12 MED ORDER — FOLIC ACID 1 MG PO TABS: 1.0000 mg | ORAL_TABLET | Freq: Every day | ORAL | Status: DC

## 2015-01-12 MED ORDER — CYANOCOBALAMIN 1000 MCG/ML IJ SOLN
INTRAMUSCULAR | Status: AC
  Filled 2015-01-12: qty 1

## 2015-01-12 MED ORDER — DEXAMETHASONE 4 MG PO TABS: ORAL_TABLET | ORAL | Status: DC

## 2015-01-12 NOTE — [facility] Oncology Navigator Note (Unsigned)
Spoke with patient today at [facility].  I provided more information on lung cancer.  I also helped patient fill out gas card application to [employer] program.  Patient and wife were thankful for the help.    Patient is meeting with financial advocates after seeing Adrena and Dr. Julien Nordmann to address other financial concerns .

## 2015-01-12 NOTE — Progress Notes (Addendum)
No images are attached to the encounter. No scans are attached to the encounter. No scans are attached to the encounter. [facility] NOTE  [place], [place] [address] [place] [address]  DIAGNOSIS: Cancer of upper lobe of right lung   Staging form: Lung, AJCC 7th Edition     Clinical stage from 12/31/2014: Stage IV (T3, N3, M1b) - Signed by Curt Bears, MD on 01/12/2015       Staging comments: Adenocarcinoma  PRIOR THERAPY: None  CURRENT THERAPY: Systemic chemotherapy with carboplatin for an AUC of 5 and Alimta 500 mg/m given every 3 weeks. First dose expected 01/20/2015  INTERVAL HISTORY: Omar Peters 80 y.o. male returns for a scheduled regular office visit for followup of his recently diagnosed stage IV non-small cell lung cancer adenocarcinoma.  He quit smoking in December 2015. He reports decreased appetite as well as continued shortness of breath. He has completed his staging workup with a PET scan and an MRI of his brain and presents to discuss the results as well as to discuss his treatment plan.  MEDICAL HISTORY: Past Medical History  Diagnosis Date  . Hypertension   . Cancer     ALLERGIES:  has No Known Allergies.  MEDICATIONS:  Current Outpatient Prescriptions  Medication Sig Dispense Refill  . amLODipine (NORVASC) 10 MG tablet Take 10 mg by mouth daily.    Marland Kitchen aspirin 81 MG tablet Take 81 mg by mouth daily.    Marland Kitchen doxycycline (ADOXA) 50 MG tablet Take 50 mg by mouth daily.     Marland Kitchen losartan-hydrochlorothiazide (HYZAAR) 100-25 MG per tablet Take 1 tablet by mouth daily.    . metoprolol (LOPRESSOR) 50 MG tablet Take 50 mg by mouth daily.     . ranitidine (ZANTAC) 150 MG tablet Take 150 mg by mouth daily.    Marland Kitchen dexamethasone (DECADRON) 4 MG tablet Take 1 tablet by mouth twice a day with food, the day before, day of and day after chemotherapy 40 tablet 0  . fluconazole (DIFLUCAN) 100 MG tablet Take 2 tablets by mouth on day one, then  take 1 tablet by mouth daily until completed 16 tablet 0  . folic acid (FOLVITE) 1 MG tablet Take 1 tablet (1 mg total) by mouth daily. 30 tablet 3  . prochlorperazine (COMPAZINE) 10 MG tablet Take 1 tablet (10 mg total) by mouth every 6 (six) hours as needed for nausea or vomiting. 30 tablet 0   No current facility-administered medications for this visit.    SURGICAL HISTORY: History reviewed. No pertinent past surgical history.  REVIEW OF SYSTEMS:  Review of Systems  Constitutional: Negative for fever, chills, weight loss, malaise/fatigue and diaphoresis.       Decreased appetite  HENT: Negative for congestion, ear discharge, ear pain, hearing loss, nosebleeds, sore throat and tinnitus.   Eyes: Negative for blurred vision, double vision, photophobia, pain, discharge and redness.  Respiratory: Positive for shortness of breath. Negative for cough, hemoptysis, sputum production, wheezing and stridor.   Cardiovascular: Negative for chest pain, palpitations, orthopnea, claudication, leg swelling and PND.  Gastrointestinal: Negative for heartburn, nausea, vomiting, abdominal pain, diarrhea, constipation, blood in stool and melena.  Genitourinary: Negative.   Musculoskeletal: Negative.   Skin: Negative.   Neurological: Negative for dizziness, tingling, focal weakness, seizures, weakness and headaches.  Endo/Heme/Allergies: Does not bruise/bleed easily.  Psychiatric/Behavioral: Negative for depression. The patient is not nervous/anxious and does not have insomnia.  PHYSICAL EXAMINATION: Physical Exam  Constitutional: He is oriented to person, place, and time and well-developed, well-nourished, and in no distress.  HENT:  Head: Normocephalic and atraumatic.  Mouth/Throat: Oropharynx is clear and moist. No oropharyngeal exudate.  Mouth reveals moderate thrush affecting tongue and buccal mucosa  Eyes: Pupils are equal, round, and reactive to light.  Neck: Normal range of motion. Neck  supple. No JVD present. No tracheal deviation present. No thyromegaly present.  Cardiovascular: Normal rate, regular rhythm, normal heart sounds and intact distal pulses.  Exam reveals no gallop and no friction rub.   No murmur heard. Pulmonary/Chest: Effort normal and breath sounds normal. No respiratory distress. He has no wheezes. He has no rales.  Dullness to percussion at the right base  Abdominal: Soft. Bowel sounds are normal. He exhibits no distension and no mass. There is no tenderness.  Musculoskeletal: Normal range of motion. He exhibits no edema or tenderness.  Lymphadenopathy:    He has no cervical adenopathy.  Neurological: He is alert and oriented to person, place, and time. He has normal reflexes. Gait normal.  Skin: Skin is warm and dry. No rash noted.    ECOG PERFORMANCE STATUS: 1 - Symptomatic but completely ambulatory  Blood pressure 121/58, pulse 84, temperature 97.7 F (36.5 C), temperature source Oral, resp. rate 18, height 5' 6" (1.676 m), weight 166 lb 9.6 oz (75.569 kg).  LABORATORY DATA: Lab Results  Component Value Date   WBC 9.8 01/07/2015   HGB 18.2* 01/07/2015   HCT 51.3 01/07/2015   MCV 99.8 01/07/2015   PLT 238 01/07/2015      Chemistry      Component Value Date/Time   NA 138 12/31/2014 1229   K 3.7 12/31/2014 1229   CO2 30* 12/31/2014 1229   BUN 16.4 12/31/2014 1229   CREATININE 1.1 12/31/2014 1229      Component Value Date/Time   CALCIUM 9.1 12/31/2014 1229   ALKPHOS 96 12/31/2014 1229   AST 23 12/31/2014 1229   ALT 35 12/31/2014 1229   BILITOT 0.66 12/31/2014 1229       RADIOGRAPHIC STUDIES:  Dg Chest 1 View  01/01/2015   CLINICAL DATA:  Initial encounter for right thoracentesis  EXAM: CHEST - 1 VIEW  COMPARISON:  12/15/2014  FINDINGS: Right pleural effusion is noted, potentially loculated. No evidence for pneumothorax status post thoracentesis. Collapse/consolidation noted in the right lower lung. Left lung is clear. Imaged bony  structures of the thorax are intact.  IMPRESSION: No evidence for pneumothorax   Electronically Signed   By: Misty Stanley M.D.   On: 01/01/2015 13:53   Dg Chest 2 View  12/15/2014   CLINICAL DATA:  Shortness of breath and cough.  EXAM: CHEST  2 VIEW  COMPARISON:  None.  FINDINGS: Right perihilar mass and/or infiltrate. Associated prominence of the mediastinum suggesting adenopathy. Right base atelectasis. Right apical pleural thickening. These findings together worrisome for malignancy. Contrast-enhanced chest CT suggested to further evaluate. Left lung clear. Heart size normal. No acute bony abnormality.  IMPRESSION: Right perihilar mass and or infiltrate with possible mediastinal adenopathy. Right base atelectasis. Prominent right apical pleural thickening. These findings together are worrisome for malignancy and contrast-enhanced chest CT suggested to further evaluate. These results will be called to the ordering clinician or representative by the Radiologist Assistant, and communication documented in the PACS or zVision Dashboard.   Electronically Signed   By: Hemphill   On: 12/15/2014 13:20   Ct Chest W Contrast  12/16/2014   CLINICAL DATA:  80 year old male with right perihilar mass seen on chest x-ray. Cough and hemoptysis for the past 10 days. Prior history of smoking, quit 3 weeks ago.  EXAM: CT CHEST WITH CONTRAST  TECHNIQUE: Multidetector CT imaging of the chest was performed during intravenous contrast administration.  CONTRAST:  37m OMNIPAQUE IOHEXOL 300 MG/ML  SOLN  COMPARISON:  Chest x-ray 12/15/2014.  FINDINGS: BUN and creatinine were obtained on site at [facility]  [address]  Results:  BUN 14 mg/dL,  Creatinine 0.8 mg/dL.  Mediastinum: Right hilar lymphadenopathy contiguous with the right upper lobe mass. Right paratracheal lymphadenopathy measuring up to 12 mm in short axis in the lower right paratracheal station. Subcarinal lymphadenopathy measuring up to 12  mm in short axis. Right-sided superior mediastinal lymph node lateral to the proximal esophagus measuring 11 mm in short axis. AP window lymph node measuring 11 mm in short axis. No left hilar adenopathy. Heart size is normal. There is no significant pericardial fluid, thickening or pericardial calcification. There is atherosclerosis of the thoracic aorta, the great vessels of the mediastinum and the coronary arteries, including calcified atherosclerotic plaque in the left anterior descending and right coronary arteries.  Lungs/Pleura: Large right upper lobe mass in the perihilar region contiguous with adjacent right hilar lymphadenopathy. This mass is difficult to discretely measure separate from the adjacent postobstructive lung consolidation/collapse, but is estimated to measure approximately 9.9 x 6.2 x 7.8 cm (images 23 of series 2 and coronal image 64). Abnormal nodal tissue is also noted around the right middle lobe bronchi, with some mild postobstructive changes in the right middle lobe and to a lesser extent in the right lower lobe. No definite direct mediastinal invasion noted at this time. Large right-sided pleural effusion, predominantly layering dependently, although there is likely a partially loculated component in the right apex adjacent to the right upper lobe mass. Background of diffuse bronchial wall thickening with moderate centrilobular and mild paraseptal emphysema.  Upper Abdomen: Shrunken appearance and nodular contour in the liver, with heterogeneous attenuation throughout the parenchyma, compatible with cirrhosis. Atherosclerosis.  Musculoskeletal: There are no aggressive appearing lytic or blastic lesions noted in the visualized portions of the skeleton.  IMPRESSION: 1. Findings, as above, highly concerning for primary right upper lobe bronchogenic carcinoma with right hilar and bilateral mediastinal lymphadenopathy, as well as a probable malignant right-sided pleural effusion. Based on  these findings, this is compatible with at least T3, N3, Mx disease (i.e., at least stage IIIB). Correlation with PET-CT and/or biopsy is recommended for further diagnostic and staging purposes. 2. Additional incidental findings, as above. These results will be called to the ordering clinician or representative by the Radiologist Assistant, and communication documented in the PACS or zVision Dashboard.   Electronically Signed   By: DVinnie LangtonM.D.   On: 12/16/2014 14:28   Mr BJeri CosWUTContrast  01/11/2015   CLINICAL DATA:  Newly diagnosed right upper lobe lung cancer. Staging.  EXAM: MRI HEAD WITHOUT AND WITH CONTRAST  TECHNIQUE: Multiplanar, multiecho pulse sequences of the brain and surrounding structures were obtained without and with intravenous contrast.  CONTRAST:  156mMULTIHANCE GADOBENATE DIMEGLUMINE 529 MG/ML IV SOLN  COMPARISON:  None.  FINDINGS: There is no evidence of acute infarct, intracranial hemorrhage, mass, midline shift, or extra-axial fluid collection. There is mild generalized cerebral atrophy. Patchy and confluent T2 hyperintensities in the subcortical and deep cerebral white matter are nonspecific but compatible with mild to  moderate chronic small vessel ischemic disease. There is no abnormal enhancement.  Prior right cataract extraction is noted. Focal mucosal thickening/small mucous retention cyst is noted in the left maxillary sinus. Mastoid air cells are clear. Major intracranial vascular flow voids are preserved.  IMPRESSION: 1. No evidence of intracranial metastases. 2. Mild-to-moderate chronic small vessel ischemic disease.   Electronically Signed   By: Logan Bores   On: 01/11/2015 11:32   Nm Pet Image Initial (pi) Skull Base To Thigh  12/29/2014   CLINICAL DATA:  Initial treatment strategy for right lung mass with mediastinal lymphadenopathy.  EXAM: NUCLEAR MEDICINE PET SKULL BASE TO THIGH  TECHNIQUE: 8.1 mCi F-18 FDG was injected intravenously. Full-ring PET imaging was  performed from the skull base to thigh after the radiotracer. CT data was obtained and used for attenuation correction and anatomic localization.  FASTING BLOOD GLUCOSE:  Value: 214 mg/dl  COMPARISON:  None.  FINDINGS: NECK  No hypermetabolic lymph nodes in the neck.  CHEST  Ill-defined right upper lobe pulmonary mass approximately 5.5 cm with SUV max 4.7. The mass is difficult to define on the background of collapse of the upper lobe. Additionally there is a large right pleural effusion occupying greater than 50% of the right hemithorax volume.  Hypermetabolic mediastinal lymph nodes involving the right lower paratracheal, subcarinal, and high right paratracheal nodal stations. Example subcarinal lymph node with SUV max 4.8. Small high right paratracheal lymph node with SUV max 3.2. The left lung is clear.  ABDOMEN/PELVIS  No abnormal hypermetabolic activity within the liver, pancreas, adrenal glands, or spleen. No hypermetabolic lymph nodes in the abdomen or pelvis.  SKELETON  There is a expansile lytic lesion within the right posterior iliac bone adjacent the SI joint measuring 3.1 x 2.6 cm and with SUV max 5.4. A second hypermetabolic lytic lesion within the right scapula just posterior to the glenoid fossa (image 55).  IMPRESSION: 1. Hypermetabolic right upper lobe mass consistent with primary bronchogenic carcinoma. 2. Hypermetabolic mediastinal nodal metastasis. 3. Large right pleural effusion. 4. Hypermetabolic skeletal metastasis involving the right scapula and right iliac bone.   Electronically Signed   By: Suzy Bouchard M.D.   On: 12/29/2014 13:11   Ct Biopsy  01/07/2015   CLINICAL DATA:  Right iliac bone lesion  EXAM: CT-GUIDED BIOPSY OF A RIGHT ILIAC BONE LESION.  CORE.  MEDICATIONS AND MEDICAL HISTORY: Versed 1 mg, Fentanyl 50 mcg.  Additional Medications: None.  ANESTHESIA/SEDATION: Moderate sedation time: 5 minutes  PROCEDURE: The procedure, risks, benefits, and alternatives were explained to  the patient. Questions regarding the procedure were encouraged and answered. The patient understands and consents to the procedure.  The right gluteal region was prepped with Betadine in a sterile fashion, and a sterile drape was applied covering the operative field. A sterile gown and sterile gloves were used for the procedure.  Under CT guidance, a(n) 17 gauge guide needle was advanced into the right iliac bone lesion. Subsequently 3 18 gauge core biopsies were obtained. The guide needle was removed. Final imaging was performed.  Patient tolerated the procedure well without complication. Vital sign monitoring by nursing staff during the procedure will continue as patient is in the special procedures unit for post procedure observation.  FINDINGS: The images document guide needle placement within the right iliac bone. Post biopsy images demonstrate no hemorrhage.  COMPLICATIONS: None.  IMPRESSION: Successful CT-guided core biopsy of a right iliac bone lesion.   Electronically Signed   By: Maryclare Bean  M.D.   On: 01/07/2015 12:45   US Thoracentesis Asp Pleural Space W/img Guide  01/01/2015   INDICATION: Symptomatic right sided pleural effusion  EXAM: US THORACENTESIS ASP PLEURAL SPACE W/IMG GUIDE  COMPARISON:  PET 12/29/14.  MEDICATIONS: None  COMPLICATIONS: None immediate  TECHNIQUE: Informed written consent was obtained from the patient after a discussion of the risks, benefits and alternatives to treatment. A timeout was performed prior to the initiation of the procedure.  Initial ultrasound scanning demonstrates a right pleural effusion. The lower chest was prepped and draped in the usual sterile fashion. 1% lidocaine was used for local anesthesia.  Under direct ultrasound guidance, a 19 gauge, 7-cm, Yueh catheter was introduced. An ultrasound image was saved for documentation purposes. The thoracentesis was performed. The catheter was removed and a dressing was applied. The patient tolerated the procedure well  without immediate post procedural complication. The patient was escorted to have an upright chest radiograph.  FINDINGS: A total of approximately 900 ml of serous fluid was removed. Requested samples were sent to the laboratory.  IMPRESSION: Successful ultrasound-guided right sided thoracentesis yielding 900 ml of pleural fluid.  Read By:  Tsosie Billing PA-C   Electronically Signed   By: Jacqulynn Cadet M.D.   On: 01/01/2015 14:23     ASSESSMENT/PLAN:  No problem-specific assessment & plan notes found for this encounter.  the patient is a pleasant 80 year old Caucasian male recently diagnosed with stage IV non-small cell lung cancer, adenocarcinoma. Both his bone marrow biopsy and the pleural fluid were positive for adenocarcinoma. His PET scan revealed metastatic disease with hypermetabolic  mediastinal nodal metastasis, large right pleural effusion, hypermetabolic skeletal metastasis involving the right scapula and right iliac bone in addition to hypermetabolic right upper lobe mass consistent with primary bronchogenic carcinoma.. The MRI of the brain was negative for brain metastasis. Patient was discussed with and also seen by Dr. Julien Nordmann. For the oral candidiasis a prescription for Diflucan was sent to his pharmacy of record [facility] via E scribe. The results of his PET scan and MRI were discussed with the patient and his wife. We'll plan to treat him with systemic chemotherapy in the form of carboplatin for an AUC of 5 and Alimta 500 mg/m given every 3 weeks with the first cycle of chemotherapy expected on 01/20/2015. He will receive a B-12 injection today. Prescription for folic acid 1 mg by mouth daily, dexamethasone 4 mg by mouth twice daily the day before, the day of, and the day after chemotherapy and for Compazine 10 mg by mouth every 6 hours as needed for nausea was also sent to his pharmacy of record via E scribe. He'll be seen in 2 weeks. For symptom management visit to see how he tolerated his  first cycle of systemic chemotherapy with carboplatin and Alimta.  Awilda Metro E, PA-C 01/12/2015 All questions were answered. The patient knows to call the clinic with any problems, questions or concerns. We can certainly see the patient much sooner if necessary.  ADDENDUM: Hematology/Oncology Attending: I had a face to face encounter with the patient. I recommended his care plan. This is a very pleasant 80 years old white male recently diagnosed with a stage IV non-small cell lung cancer, adenocarcinoma. The patient completed the staging workup with a PET scan that showed several other metastatic lesions including the hypermetabolic right upper lobe lung mass in addition to mediastinal lymphadenopathy and discrete L metastasis in the scapula and hip. His brain MRI showed no evidence for metastatic  disease to the brain. I had a lengthy discussion with the patient today about his current disease status and treatment options. I will send the tissue block to be tested for EGFR mutation as well as ALK gene translocation. I discussed with the patient has treatment options including palliative care and hospice referral versus consideration of systemic chemotherapy was carboplatin for AUC of 5 and Alimta 500 MG/M2 every 3 weeks. The patient would like to proceed with systemic chemotherapy. We discussed the adverse effect of this treatment with the patient and his wife. He will receive vitamin B 12 injection today. We will call his pharmacy with prescription for folic acid, Compazine as well as Decadron. The patient would have a chemotherapy education class before starting the first dose of his treatment. He is expected to start the first cycle of this treatment on 01/20/2015. He would come back for follow-up visit in 2 weeks for reevaluation and management of any adverse effect of his treatment. The patient was advised to call immediately if he has any concerning symptoms in the  interval.  Disclaimer: This note was dictated with voice recognition software. Similar sounding words can inadvertently be transcribed and may be missed upon review. Eilleen Kempf., MD 01/16/2015

## 2015-01-12 NOTE — Telephone Encounter (Signed)
gv and printed appt sched and avs for pt for Feb...s.ed added txs.

## 2015-01-12 NOTE — Patient Instructions (Addendum)
Cyanocobalamin, Vitamin B12 injection What is this medicine? CYANOCOBALAMIN (sye an oh koe BAL a min) is a man made form of vitamin B12. Vitamin B12 is used in the growth of healthy blood cells, nerve cells, and proteins in the body. It also helps with the metabolism of fats and carbohydrates. This medicine is used to treat people who can not absorb vitamin B12. This medicine may be used for other purposes; ask your health care provider or pharmacist if you have questions. COMMON BRAND NAME(S): Cyomin, LA-12, Nutri-Twelve, Primabalt What should I tell my health care provider before I take this medicine? They need to know if you have any of these conditions: -kidney disease -Leber's disease -megaloblastic anemia -an unusual or allergic reaction to cyanocobalamin, cobalt, other medicines, foods, dyes, or preservatives -pregnant or trying to get pregnant -breast-feeding How should I use this medicine? This medicine is injected into a muscle or deeply under the skin. It is usually given by a health care professional in a clinic or doctor's office. However, your doctor may teach you how to inject yourself. Follow all instructions. Talk to your pediatrician regarding the use of this medicine in children. Special care may be needed. Overdosage: If you think you have taken too much of this medicine contact a poison control center or emergency room at once. NOTE: This medicine is only for you. Do not share this medicine with others. What if I miss a dose? If you are given your dose at a clinic or doctor's office, call to reschedule your appointment. If you give your own injections and you miss a dose, take it as soon as you can. If it is almost time for your next dose, take only that dose. Do not take double or extra doses. What may interact with this medicine? -colchicine -heavy alcohol intake This list may not describe all possible interactions. Give your health care provider a list of all the  medicines, herbs, non-prescription drugs, or dietary supplements you use. Also tell them if you smoke, drink alcohol, or use illegal drugs. Some items may interact with your medicine. What should I watch for while using this medicine? Visit your doctor or health care professional regularly. You may need blood work done while you are taking this medicine. You may need to follow a special diet. Talk to your doctor. Limit your alcohol intake and avoid smoking to get the best benefit. What side effects may I notice from receiving this medicine? Side effects that you should report to your doctor or health care professional as soon as possible: -allergic reactions like skin rash, itching or hives, swelling of the face, lips, or tongue -blue tint to skin -chest tightness, pain -difficulty breathing, wheezing -dizziness -red, swollen painful area on the leg Side effects that usually do not require medical attention (report to your doctor or health care professional if they continue or are bothersome): -diarrhea -headache This list may not describe all possible side effects. Call your doctor for medical advice about side effects. You may report side effects to FDA at 1-800-FDA-1088. Where should I keep my medicine? Keep out of the reach of children. Store at room temperature between 15 and 30 degrees C (59 and 85 degrees F). Protect from light. Throw away any unused medicine after the expiration date. NOTE: This sheet is a summary. It may not cover all possible information. If you have questions about this medicine, talk to your doctor, pharmacist, or health care provider.  2015, [employer]vier/[employer] Standard. (2008-03-16 22:10:20)    Take the Diflucan as prescribed to treat the thrush in your mouth. Take the Folic Acid as prescribed daily, take the dexamethasone as prescribed the day before, the day of, and the day after chemotherapy. Take the Compazine as prescribed as needed for nausea or vomiting Return  in one week to start her first cycle of chemotherapy Follow-up in 2 weeks for symptom management visit

## 2015-01-14 ENCOUNTER — Ambulatory Visit
Admission: RE | Admit: 2015-01-14 | Discharge: 2015-01-14 | Disposition: A | Discharge status: Home / Self Care | Payer: Medicare Other | Source: Ambulatory Visit | Attending: Cardiothoracic Surgery | Admitting: Cardiothoracic Surgery

## 2015-01-14 ENCOUNTER — Ambulatory Visit (INDEPENDENT_AMBULATORY_CARE_PROVIDER_SITE_OTHER): Payer: Medicare Other | Admitting: Cardiothoracic Surgery

## 2015-01-14 ENCOUNTER — Encounter: Payer: Self-pay | Admitting: Cardiothoracic Surgery

## 2015-01-14 ENCOUNTER — Encounter (HOSPITAL_COMMUNITY): Payer: Self-pay | Admitting: *Deleted

## 2015-01-14 ENCOUNTER — Other Ambulatory Visit: Payer: Self-pay | Admitting: *Deleted

## 2015-01-14 VITALS — BP 105/69 | HR 88 | Resp 16 | Ht 66.0 in | Wt 166.0 lb

## 2015-01-14 DIAGNOSIS — J910 Malignant pleural effusion: Secondary | ICD-10-CM

## 2015-01-14 DIAGNOSIS — J948 Other specified pleural conditions: Secondary | ICD-10-CM

## 2015-01-14 DIAGNOSIS — C3411 Malignant neoplasm of upper lobe, right bronchus or lung: Secondary | ICD-10-CM

## 2015-01-14 DIAGNOSIS — J90 Pleural effusion, not elsewhere classified: Secondary | ICD-10-CM

## 2015-01-14 NOTE — Progress Notes (Signed)
   01/14/15 1744  OBSTRUCTIVE SLEEP APNEA  Have you ever been diagnosed with sleep apnea through a sleep study? No  Do you snore loudly (loud enough to be heard through closed doors)?  1  Do you often feel tired, fatigued, or sleepy during the daytime? 1  Has anyone observed you stop breathing during your sleep? 1  Do you have, or are you being treated for high blood pressure? 1  BMI more than 35 kg/m2? 0  Age over 78 years old? 1  Neck circumference greater than 40 cm/16 inches? 1 (17)  Gender: 1  Obstructive Sleep Apnea Score 7  Score 4 or greater  Results sent to PCP

## 2015-01-14 NOTE — Progress Notes (Signed)
[address]       [place] [address]             [phone]                    [facility] Medical Record #[id number] Date of Birth: 1935/05/02  Referring: Curt Bears, MD Primary Care: Tamsen Roers, MD  Chief Complaint:   Lung mass   History of Present Illness:    Omar Peters 80 y.o. male is seen in the office  today for for what appears to be advanced stage IV carcinoma the lung but without definitive tissue diagnosis the patient has been a long-term smoker more than 50 years. 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 Several weeks ago he developed increasing cough with hemoptysis. This led to a chest x-ray and CT scan of the chest. A PET scan has been performed.  The patient notes that currently his breathing has improved he's had no further hemoptysis. He denies any bony pain.   Thoracentesis and needle bx of pelvis confirms none small cell cancer.  Patient called the office today and noted he thought pleural fluid was increasing and he noted increased sob. He came to office today with chest xray.  Cancer of upper lobe of right lung   Staging form: Lung, AJCC 7th Edition     Clinical stage from 12/31/2014: Stage IV (T3, N3, M1b) - Signed by Curt Bears, MD on 01/12/2015  Current Activity/ Functional Status:  Patient is independent with mobility/ambulation, transfers, ADL's, IADL's.   Zubrod Score: At the time of surgery this patient's most appropriate activity status/level should be described as: []     0    Normal activity, no symptoms [x]     1    Restricted in physical strenuous activity but ambulatory, able to do out light work []     2    Ambulatory and capable of self care, unable to do work activities, up and about               >50 % of waking hours                              []     3    Only limited self care, in bed greater  than 50% of waking hours []     4    Completely disabled, no self care, confined to bed or chair []     5    Moribund   Past Medical History  Diagnosis Date  . Hypertension   . Cancer     Lung  . GERD (gastroesophageal reflux disease)   . Shortness of breath dyspnea     01/14/15 at night and has to sit up  . Anxiety     due to diagnosis of Stage 4 Lung Cancer  . Macular degeneration of right eye     Past Surgical History  Procedure Laterality Date  . Vein ligation and stripping  1977    Family History  Problem Relation Age of Onset  . Stroke Mother   . Cancer Father   . Cancer Brother     History   Social History  . Marital Status: Married    Spouse Name: N/A    Number of Children: N/A  . Years of Education: N/A  Occupational History  .  work maintenance in [place] . Patient notes exposure to asbestos    Social History Main Topics  . Smoking status: Former Smoker -- 1.00 packs/day for 50 years    Quit date: 12/14/2014  . Smokeless tobacco: Former Systems developer    Quit date: 12/14/2014  . Alcohol Use: Not on file  . Drug Use: No  . Sexual Activity: Not on file     History  Smoking status  . Former Smoker -- 1.00 packs/day for 50 years  . Quit date: 12/14/2014  Smokeless tobacco  . Former Systems developer  . Quit date: 12/14/2014    History  Alcohol Use  . 4.2 oz/week  . 7 Shots of liquor per week     No Known Allergies  Current Outpatient Prescriptions  Medication Sig Dispense Refill  . amLODipine (NORVASC) 10 MG tablet Take 10 mg by mouth daily.    Marland Kitchen aspirin 81 MG tablet Take 81 mg by mouth daily.    Marland Kitchen doxycycline (ADOXA) 50 MG tablet Take 50 mg by mouth daily.     . fluconazole (DIFLUCAN) 100 MG tablet Take 2 tablets by mouth on day one, then take 1 tablet by mouth daily until completed 16 tablet 0  . folic acid (FOLVITE) 1 MG tablet Take 1 tablet (1 mg total) by mouth daily. 30 tablet 3  . losartan-hydrochlorothiazide (HYZAAR) 100-25 MG per  tablet Take 1 tablet by mouth daily.    . metoprolol (LOPRESSOR) 50 MG tablet Take 50 mg by mouth daily.     . ranitidine (ZANTAC) 150 MG tablet Take 150 mg by mouth daily.    Marland Kitchen dexamethasone (DECADRON) 4 MG tablet Take 1 tablet by mouth twice a day with food, the day before, day of and day after chemotherapy 40 tablet 0  . prochlorperazine (COMPAZINE) 10 MG tablet Take 1 tablet (10 mg total) by mouth every 6 (six) hours as needed for nausea or vomiting. 30 tablet 0   No current facility-administered medications for this visit.     Review of Systems:     Cardiac Review of Systems: Y or N  Chest Pain [  n]  Resting SOB [  y ] Exertional SOB  [ y ]  Orthopnea [ n ]   Pedal Edema [n  ]    Palpitations [ n ] Syncope  [  ]   Presyncope [ n  ]  General Review of Systems: [Y] = yes [  ]=no Constitional: recent weight change [n  ];  Wt loss over the last 3 months [   ] anorexia [  ]; fatigue [ y ]; nausea [  ]; night sweats [  ]; fever [n  ]; or chills [n  ];          Dental: poor dentition[  ]; Last Dentist visit:   Eye : blurred vision [  ]; diplopia [   ]; vision changes [  ];  Amaurosis fugax[  ]; Resp: cough [ y ];  wheezing[ n ];  hemoptysis[y  ]; shortness of breath[y  ]; paroxysmal nocturnal dyspnea[ y ]; dyspnea on exertion[y  ]; or orthopnea[  ];  GI:  gallstones[  ], vomiting[n  ];  dysphagia[ n ]; melena[ n ];  hematochezia [n  ]; heartburn[  ];   Hx of  Colonoscopy[  ]; GU: kidney stones [  ]; hematuria[  ];   dysuria [  ];  nocturia[  ];  history of     obstruction [  ]; urinary frequency [ n ]             Skin: rash, swelling[  ];, hair loss[  ];  peripheral edema[  ];  or itching[  ]; Musculosketetal: myalgias[  ];  joint swelling[n  ];  joint erythema[ n ];  joint pain[ n ];  back pain[ n;  Heme/Lymph: bruising[  ];  bleeding[  ];  anemia[  ];  Neuro: TIA[  ];  headaches[  ];  stroke[  ];  vertigo[  ];  seizures[ n ];   paresthesias[  ];  difficulty walking[n  ];  Psych:depression[   ]; anxiety[  ];  Endocrine: diabetes[  ];  thyroid dysfunction[  ];  Immunizations: Flu up to date [ n]; Pneumococcal up to date [n  ];  Other:  Physical Exam: BP 105/69 mmHg  Pulse 88  Resp 16  Ht 5\' 6"  (1.676 m)  Wt 166 lb (75.297 kg)  BMI 26.81 kg/m2  SpO2 95%  PHYSICAL EXAMINATION:  General appearance: alert and cooperative Neurologic: intact Heart: regular rate and rhythm, S1, S2 normal, no murmur, click, rub or gallop Lungs: diminished breath sounds RUL, absent breath sounds at the right base Abdomen: soft, non-tender; bowel sounds normal; no masses,  no organomegaly Extremities: extremities normal, atraumatic, no cyanosis or edema and Homans sign is negative, no sign of DVT Patient has no cervical supraclavicular axillary adenopathy He has no carotid bruits , distal pulses 2 DP PT bilaterally are 2+    Diagnostic Studies & Laboratory data:     Recent Radiology Findings Dg Chest 1 View  01/01/2015   CLINICAL DATA:  Initial encounter for right thoracentesis  EXAM: CHEST - 1 VIEW  COMPARISON:  12/15/2014  FINDINGS: Right pleural effusion is noted, potentially loculated. No evidence for pneumothorax status post thoracentesis. Collapse/consolidation noted in the right lower lung. Left lung is clear. Imaged bony structures of the thorax are intact.  IMPRESSION: No evidence for pneumothorax   Electronically Signed   By: Misty Stanley M.D.   On: 01/01/2015 13:53   Dg Chest 2 View  01/14/2015   CLINICAL DATA:  Shortness of breath for 2 weeks.  EXAM: CHEST  2 VIEW  COMPARISON:  01/01/2015.  PET-CT 12/29/2014  FINDINGS: Opacification of the right hemi thorax is again noted consistent with right pleural effusion and underlying right lung atelectasis. There is mild shift of the mediastinum to the right. The distal right mainstem bronchus is not identified. Left lung is clear. Heart size normal. No acute osseous abnormality.  IMPRESSION: Opacification of the right hemithorax is again noted  consistent right pleural effusion and underlying atelectasis. Right mainstem bronchus appears occluded suggesting a centrally obstructing lesion. Reference made to PET-CT report of 12/29/2014.   Electronically Signed   By: Marcello Moores  Register   On: 01/14/2015 11:07   Ct Chest W Contrast  12/16/2014   CLINICAL DATA:  80 year old male with right perihilar mass seen on chest x-ray. Cough and hemoptysis for the past 10 days. Prior history of smoking, quit 3 weeks ago.  EXAM: CT CHEST WITH CONTRAST  TECHNIQUE: Multidetector CT imaging of the chest was performed during intravenous contrast administration.  CONTRAST:  59mL OMNIPAQUE IOHEXOL 300 MG/ML  SOLN  COMPARISON:  Chest x-ray 12/15/2014.  FINDINGS: BUN and creatinine were obtained on site at [facility] at  [address]  Results:  BUN 14 mg/dL,  Creatinine 0.8 mg/dL.  Mediastinum:  Right hilar lymphadenopathy contiguous with the right upper lobe mass. Right paratracheal lymphadenopathy measuring up to 12 mm in short axis in the lower right paratracheal station. Subcarinal lymphadenopathy measuring up to 12 mm in short axis. Right-sided superior mediastinal lymph node lateral to the proximal esophagus measuring 11 mm in short axis. AP window lymph node measuring 11 mm in short axis. No left hilar adenopathy. Heart size is normal. There is no significant pericardial fluid, thickening or pericardial calcification. There is atherosclerosis of the thoracic aorta, the great vessels of the mediastinum and the coronary arteries, including calcified atherosclerotic plaque in the left anterior descending and right coronary arteries.  Lungs/Pleura: Large right upper lobe mass in the perihilar region contiguous with adjacent right hilar lymphadenopathy. This mass is difficult to discretely measure separate from the adjacent postobstructive lung consolidation/collapse, but is estimated to measure approximately 9.9 x 6.2 x 7.8 cm (images 23 of series 2 and coronal  image 64). Abnormal nodal tissue is also noted around the right middle lobe bronchi, with some mild postobstructive changes in the right middle lobe and to a lesser extent in the right lower lobe. No definite direct mediastinal invasion noted at this time. Large right-sided pleural effusion, predominantly layering dependently, although there is likely a partially loculated component in the right apex adjacent to the right upper lobe mass. Background of diffuse bronchial wall thickening with moderate centrilobular and mild paraseptal emphysema.  Upper Abdomen: Shrunken appearance and nodular contour in the liver, with heterogeneous attenuation throughout the parenchyma, compatible with cirrhosis. Atherosclerosis.  Musculoskeletal: There are no aggressive appearing lytic or blastic lesions noted in the visualized portions of the skeleton.  IMPRESSION: 1. Findings, as above, highly concerning for primary right upper lobe bronchogenic carcinoma with right hilar and bilateral mediastinal lymphadenopathy, as well as a probable malignant right-sided pleural effusion. Based on these findings, this is compatible with at least T3, N3, Mx disease (i.e., at least stage IIIB). Correlation with PET-CT and/or biopsy is recommended for further diagnostic and staging purposes. 2. Additional incidental findings, as above. These results will be called to the ordering clinician or representative by the Radiologist Assistant, and communication documented in the PACS or zVision Dashboard.   Electronically Signed   By: Vinnie Langton M.D.   On: 12/16/2014 14:28   Mr Jeri Cos DG Contrast  01/11/2015   CLINICAL DATA:  Newly diagnosed right upper lobe lung cancer. Staging.  EXAM: MRI HEAD WITHOUT AND WITH CONTRAST  TECHNIQUE: Multiplanar, multiecho pulse sequences of the brain and surrounding structures were obtained without and with intravenous contrast.  CONTRAST:  68mL MULTIHANCE GADOBENATE DIMEGLUMINE 529 MG/ML IV SOLN  COMPARISON:   None.  FINDINGS: There is no evidence of acute infarct, intracranial hemorrhage, mass, midline shift, or extra-axial fluid collection. There is mild generalized cerebral atrophy. Patchy and confluent T2 hyperintensities in the subcortical and deep cerebral white matter are nonspecific but compatible with mild to moderate chronic small vessel ischemic disease. There is no abnormal enhancement.  Prior right cataract extraction is noted. Focal mucosal thickening/small mucous retention cyst is noted in the left maxillary sinus. Mastoid air cells are clear. Major intracranial vascular flow voids are preserved.  IMPRESSION: 1. No evidence of intracranial metastases. 2. Mild-to-moderate chronic small vessel ischemic disease.   Electronically Signed   By: Logan Bores   On: 01/11/2015 11:32   Nm Pet Image Initial (pi) Skull Base To Thigh  12/29/2014   CLINICAL DATA:  Initial treatment strategy for right lung mass  with mediastinal lymphadenopathy.  EXAM: NUCLEAR MEDICINE PET SKULL BASE TO THIGH  TECHNIQUE: 8.1 mCi F-18 FDG was injected intravenously. Full-ring PET imaging was performed from the skull base to thigh after the radiotracer. CT data was obtained and used for attenuation correction and anatomic localization.  FASTING BLOOD GLUCOSE:  Value: 214 mg/dl  COMPARISON:  None.  FINDINGS: NECK  No hypermetabolic lymph nodes in the neck.  CHEST  Ill-defined right upper lobe pulmonary mass approximately 5.5 cm with SUV max 4.7. The mass is difficult to define on the background of collapse of the upper lobe. Additionally there is a large right pleural effusion occupying greater than 50% of the right hemithorax volume.  Hypermetabolic mediastinal lymph nodes involving the right lower paratracheal, subcarinal, and high right paratracheal nodal stations. Example subcarinal lymph node with SUV max 4.8. Small high right paratracheal lymph node with SUV max 3.2. The left lung is clear.  ABDOMEN/PELVIS  No abnormal hypermetabolic  activity within the liver, pancreas, adrenal glands, or spleen. No hypermetabolic lymph nodes in the abdomen or pelvis.  SKELETON  There is a expansile lytic lesion within the right posterior iliac bone adjacent the SI joint measuring 3.1 x 2.6 cm and with SUV max 5.4. A second hypermetabolic lytic lesion within the right scapula just posterior to the glenoid fossa (image 55).  IMPRESSION: 1. Hypermetabolic right upper lobe mass consistent with primary bronchogenic carcinoma. 2. Hypermetabolic mediastinal nodal metastasis. 3. Large right pleural effusion. 4. Hypermetabolic skeletal metastasis involving the right scapula and right iliac bone.   Electronically Signed   By: Suzy Bouchard M.D.   On: 12/29/2014 13:11   Ct Biopsy  01/07/2015   CLINICAL DATA:  Right iliac bone lesion  EXAM: CT-GUIDED BIOPSY OF A RIGHT ILIAC BONE LESION.  CORE.  MEDICATIONS AND MEDICAL HISTORY: Versed 1 mg, Fentanyl 50 mcg.  Additional Medications: None.  ANESTHESIA/SEDATION: Moderate sedation time: 5 minutes  PROCEDURE: The procedure, risks, benefits, and alternatives were explained to the patient. Questions regarding the procedure were encouraged and answered. The patient understands and consents to the procedure.  The right gluteal region was prepped with Betadine in a sterile fashion, and a sterile drape was applied covering the operative field. A sterile gown and sterile gloves were used for the procedure.  Under CT guidance, a(n) 17 gauge guide needle was advanced into the right iliac bone lesion. Subsequently 3 18 gauge core biopsies were obtained. The guide needle was removed. Final imaging was performed.  Patient tolerated the procedure well without complication. Vital sign monitoring by nursing staff during the procedure will continue as patient is in the special procedures unit for post procedure observation.  FINDINGS: The images document guide needle placement within the right iliac bone. Post biopsy images demonstrate no  hemorrhage.  COMPLICATIONS: None.  IMPRESSION: Successful CT-guided core biopsy of a right iliac bone lesion.   Electronically Signed   By: Maryclare Bean M.D.   On: 01/07/2015 12:45   US Thoracentesis Asp Pleural Space W/img Guide  01/01/2015   INDICATION: Symptomatic right sided pleural effusion  EXAM: US THORACENTESIS ASP PLEURAL SPACE W/IMG GUIDE  COMPARISON:  PET 12/29/14.  MEDICATIONS: None  COMPLICATIONS: None immediate  TECHNIQUE: Informed written consent was obtained from the patient after a discussion of the risks, benefits and alternatives to treatment. A timeout was performed prior to the initiation of the procedure.  Initial ultrasound scanning demonstrates a right pleural effusion. The lower chest was prepped and draped in the usual sterile  fashion. 1% lidocaine was used for local anesthesia.  Under direct ultrasound guidance, a 19 gauge, 7-cm, Yueh catheter was introduced. An ultrasound image was saved for documentation purposes. The thoracentesis was performed. The catheter was removed and a dressing was applied. The patient tolerated the procedure well without immediate post procedural complication. The patient was escorted to have an upright chest radiograph.  FINDINGS: A total of approximately 900 ml of serous fluid was removed. Requested samples were sent to the laboratory.  IMPRESSION: Successful ultrasound-guided right sided thoracentesis yielding 900 ml of pleural fluid.  Read By:  Tsosie Billing PA-C   Electronically Signed   By: Jacqulynn Cadet M.D.   On: 01/01/2015 14:23     I have independently reviewed the above radiology studies  and reviewed the findings with the patient.      Recent Lab Findings: Lab Results  Component Value Date   WBC 9.8 01/07/2015   HGB 18.2* 01/07/2015   HCT 51.3 01/07/2015   PLT 238 01/07/2015   GLUCOSE 240* 12/31/2014   ALT 35 12/31/2014   AST 23 12/31/2014   NA 138 12/31/2014   K 3.7 12/31/2014   CREATININE 1.1 12/31/2014   BUN 16.4 12/31/2014     CO2 30* 12/31/2014   INR 1.16 01/07/2015      Assessment / Plan:   Clinical stage IV carcinoma the right lung primarily involving the right upper lobe but with mediastinal involvement lung, with bony metastasis and a large right pleural effusion  Confirmed by histology   With   rapid recurrence of the right pleural fluid I have discussed with the patient placement of right pleural drain  Tomorrow. Risks and options for treatment discussed . Patient agreeable to  Proceeding  with placement of Pleurx catheter tomorrow.   I spent 30 minutes counseling the patient face to face. The total time spent in the appointment was 45 minutes.  Grace Isaac MD      [address] [place] [address] Office [phone]   Beeper [phone]  01/14/2015 7:23 PM

## 2015-01-15 ENCOUNTER — Ambulatory Visit (HOSPITAL_COMMUNITY): Payer: Medicare Other | Admitting: Anesthesiology

## 2015-01-15 ENCOUNTER — Ambulatory Visit (HOSPITAL_COMMUNITY)
Admission: RE | Admit: 2015-01-15 | Discharge: 2015-01-15 | Disposition: A | Discharge status: Home / Self Care | Payer: Medicare Other | Source: Ambulatory Visit | Attending: Cardiothoracic Surgery | Admitting: Cardiothoracic Surgery

## 2015-01-15 ENCOUNTER — Encounter (HOSPITAL_COMMUNITY): Payer: Self-pay | Admitting: *Deleted

## 2015-01-15 ENCOUNTER — Ambulatory Visit (HOSPITAL_COMMUNITY): Payer: Medicare Other

## 2015-01-15 ENCOUNTER — Encounter (HOSPITAL_COMMUNITY)
Admission: RE | Disposition: A | Discharge status: Home / Self Care | Payer: Self-pay | Source: Ambulatory Visit | Attending: Cardiothoracic Surgery

## 2015-01-15 DIAGNOSIS — I10 Essential (primary) hypertension: Secondary | ICD-10-CM | POA: Diagnosis not present

## 2015-01-15 DIAGNOSIS — C3490 Malignant neoplasm of unspecified part of unspecified bronchus or lung: Secondary | ICD-10-CM | POA: Insufficient documentation

## 2015-01-15 DIAGNOSIS — J910 Malignant pleural effusion: Secondary | ICD-10-CM | POA: Insufficient documentation

## 2015-01-15 DIAGNOSIS — F419 Anxiety disorder, unspecified: Secondary | ICD-10-CM | POA: Diagnosis not present

## 2015-01-15 DIAGNOSIS — Z87891 Personal history of nicotine dependence: Secondary | ICD-10-CM | POA: Diagnosis not present

## 2015-01-15 DIAGNOSIS — Z7982 Long term (current) use of aspirin: Secondary | ICD-10-CM | POA: Diagnosis not present

## 2015-01-15 DIAGNOSIS — C7951 Secondary malignant neoplasm of bone: Secondary | ICD-10-CM | POA: Diagnosis not present

## 2015-01-15 DIAGNOSIS — K219 Gastro-esophageal reflux disease without esophagitis: Secondary | ICD-10-CM | POA: Insufficient documentation

## 2015-01-15 DIAGNOSIS — Z85118 Personal history of other malignant neoplasm of bronchus and lung: Secondary | ICD-10-CM | POA: Diagnosis not present

## 2015-01-15 DIAGNOSIS — J90 Pleural effusion, not elsewhere classified: Secondary | ICD-10-CM

## 2015-01-15 HISTORY — PX: CHEST TUBE INSERTION: SHX231

## 2015-01-15 HISTORY — DX: Reserved for inherently not codable concepts without codable children: IMO0001

## 2015-01-15 HISTORY — DX: Gastro-esophageal reflux disease without esophagitis: K21.9

## 2015-01-15 HISTORY — DX: Anxiety disorder, unspecified: F41.9

## 2015-01-15 HISTORY — DX: Unspecified macular degeneration: H35.30

## 2015-01-15 LAB — COMPREHENSIVE METABOLIC PANEL
ALT: 38 U/L (ref 0–53)
AST: 42 U/L — ABNORMAL HIGH (ref 0–37)
Albumin: 3.5 g/dL (ref 3.5–5.2)
Alkaline Phosphatase: 102 U/L (ref 39–117)
Anion gap: 7 (ref 5–15)
BUN: 13 mg/dL (ref 6–23)
CO2: 34 mmol/L — ABNORMAL HIGH (ref 19–32)
Calcium: 9.4 mg/dL (ref 8.4–10.5)
Chloride: 97 mmol/L (ref 96–112)
Creatinine, Ser: 0.96 mg/dL (ref 0.50–1.35)
GFR calc Af Amer: >90 mL/min (ref >90)
GFR calc non Af Amer: 78 mL/min — ABNORMAL LOW (ref >90)
Glucose, Bld: 139 mg/dL — ABNORMAL HIGH (ref 70–99)
Potassium: 3.5 mmol/L (ref 3.5–5.1)
Sodium: 138 mmol/L (ref 135–145)
Total Bilirubin: 0.8 mg/dL (ref 0.3–1.2)
Total Protein: 7.5 g/dL (ref 6.0–8.3)

## 2015-01-15 LAB — CBC
HCT: 50.4 % (ref 39.0–52.0)
Hemoglobin: 17.9 g/dL — ABNORMAL HIGH (ref 13.0–17.0)
MCH: 35.2 pg — ABNORMAL HIGH (ref 26.0–34.0)
MCHC: 35.5 g/dL (ref 30.0–36.0)
MCV: 99 fL (ref 78.0–100.0)
Platelets: 233 10*3/uL (ref 150–400)
RBC: 5.09 MIL/uL (ref 4.22–5.81)
RDW: 12.3 % (ref 11.5–15.5)
WBC: 8.9 10*3/uL (ref 4.0–10.5)

## 2015-01-15 LAB — PROTIME-INR
INR: 1.11 (ref 0.00–1.49)
Prothrombin Time: 14.5 seconds (ref 11.6–15.2)

## 2015-01-15 LAB — APTT: aPTT: 35 seconds (ref 24–37)

## 2015-01-15 SURGERY — INSERTION, PLEURAL DRAINAGE CATHETER
Anesthesia: Monitor Anesthesia Care | Site: Chest | Laterality: Right

## 2015-01-15 MED ORDER — MIDAZOLAM HCL 5 MG/5ML IJ SOLN
INTRAMUSCULAR | Status: DC | PRN
  Administered 2015-01-15: 1 mg via INTRAVENOUS

## 2015-01-15 MED ORDER — HYDROCODONE-ACETAMINOPHEN 5-325 MG PO TABS: 1.0000 | ORAL_TABLET | Freq: Four times a day (QID) | ORAL | Status: AC | PRN

## 2015-01-15 MED ORDER — LACTATED RINGERS IV SOLN
INTRAVENOUS | Status: DC | PRN
  Administered 2015-01-15: 07:00:00 via INTRAVENOUS

## 2015-01-15 MED ORDER — LIDOCAINE HCL 1 % IJ SOLN
INTRAMUSCULAR | Status: DC | PRN
  Administered 2015-01-15: 10 mL

## 2015-01-15 MED ORDER — DEXTROSE 5 % IV SOLN
1.5000 g | INTRAVENOUS | Status: AC
  Administered 2015-01-15: 1.5 g via INTRAVENOUS
  Filled 2015-01-15: qty 1.5

## 2015-01-15 MED ORDER — 0.9 % SODIUM CHLORIDE (POUR BTL) OPTIME
TOPICAL | Status: DC | PRN
  Administered 2015-01-15: 1000 mL

## 2015-01-15 MED ORDER — MIDAZOLAM HCL 2 MG/2ML IJ SOLN
INTRAMUSCULAR | Status: AC
  Filled 2015-01-15: qty 2

## 2015-01-15 MED ORDER — PROPOFOL 10 MG/ML IV BOLUS
INTRAVENOUS | Status: DC | PRN
  Administered 2015-01-15 (×2): 20 mg via INTRAVENOUS

## 2015-01-15 MED ORDER — FENTANYL CITRATE 0.05 MG/ML IJ SOLN
INTRAMUSCULAR | Status: AC
  Filled 2015-01-15: qty 5

## 2015-01-15 MED ORDER — FENTANYL CITRATE 0.05 MG/ML IJ SOLN
INTRAMUSCULAR | Status: DC | PRN
  Administered 2015-01-15: 25 ug via INTRAVENOUS

## 2015-01-15 SURGICAL SUPPLY — 27 items
ADH SKN CLS APL DERMABOND .7 (GAUZE/BANDAGES/DRESSINGS) ×1
BRUSH SCRUB EZ PLAIN DRY (MISCELLANEOUS) ×4 IMPLANT
CANISTER SUCTION 2500CC (MISCELLANEOUS) ×2 IMPLANT
COVER SURGICAL LIGHT HANDLE (MISCELLANEOUS) ×2 IMPLANT
COVER TRANSDUCER ULTRASND GEL (DRAPE) ×2 IMPLANT
DERMABOND ADVANCED (GAUZE/BANDAGES/DRESSINGS) ×1
DERMABOND ADVANCED .7 DNX12 (GAUZE/BANDAGES/DRESSINGS) ×1 IMPLANT
DRAPE C-ARM 42X72 X-RAY (DRAPES) ×2 IMPLANT
DRAPE LAPAROSCOPIC ABDOMINAL (DRAPES) ×2 IMPLANT
GLOVE BIO SURGEON STRL SZ 6.5 (GLOVE) ×3 IMPLANT
GLOVE BIOGEL PI IND STRL 6 (GLOVE) IMPLANT
GLOVE BIOGEL PI INDICATOR 6 (GLOVE) ×2
GOWN STRL REUS W/ TWL LRG LVL3 (GOWN DISPOSABLE) ×2 IMPLANT
GOWN STRL REUS W/TWL LRG LVL3 (GOWN DISPOSABLE) ×4
KIT BASIN OR (CUSTOM PROCEDURE TRAY) ×2 IMPLANT
KIT PLEURX DRAIN CATH 1000ML (MISCELLANEOUS) ×2 IMPLANT
KIT PLEURX DRAIN CATH 15.5FR (DRAIN) ×2 IMPLANT
KIT ROOM TURNOVER OR (KITS) ×2 IMPLANT
NS IRRIG 1000ML POUR BTL (IV SOLUTION) ×2 IMPLANT
PACK GENERAL/GYN (CUSTOM PROCEDURE TRAY) ×2 IMPLANT
PAD ARMBOARD 7.5X6 YLW CONV (MISCELLANEOUS) ×4 IMPLANT
SET DRAINAGE LINE (MISCELLANEOUS) IMPLANT
SUT ETHILON 3 0 FSL (SUTURE) ×2 IMPLANT
SUT VIC AB 3-0 X1 27 (SUTURE) ×2 IMPLANT
TOWEL OR 17X24 6PK STRL BLUE (TOWEL DISPOSABLE) ×2 IMPLANT
TOWEL OR 17X26 10 PK STRL BLUE (TOWEL DISPOSABLE) ×2 IMPLANT
WATER STERILE IRR 1000ML POUR (IV SOLUTION) ×2 IMPLANT

## 2015-01-15 NOTE — Brief Op Note (Signed)
      [address]       [place] [address]             [phone]      01/15/2015  8:11 AM  PATIENT:  Omar Peters  80 y.o. male  PRE-OPERATIVE DIAGNOSIS:  RIGHT PLEURAL EFFUSION- malignanat  POST-OPERATIVE DIAGNOSIS:  same  PROCEDURE:  Procedure(s): INSERTION PLEURAL DRAINAGE CATHETER WITH ULTRASOUND AND FLURO GUIDANCE (Right)  SURGEON:  Surgeon(s) and Role:    * Grace Isaac, MD - Primary    ANESTHESIA:   MAC  EBL:  Total I/O In: 400 [I.V.:400] Out: -  1200 ml pleurqal fluid  BLOOD ADMINISTERED:none  DRAINS: right pleurix cath   LOCAL MEDICATIONS USED:  LIDOCAINE  and Amount: 10 ml  SPECIMEN:  No Specimen  DISPOSITION OF SPECIMEN:  N/A  COUNTS:  YES  DICTATION: .Dragon Dictation  PLAN OF CARE: Discharge to home after PACU  PATIENT DISPOSITION:  PACU - hemodynamically stable.   Delay start of Pharmacological VTE agent (>24hrs) due to surgical blood loss or risk of bleeding: yes

## 2015-01-15 NOTE — Transfer of Care (Signed)
Immediate Anesthesia Transfer of Care Note  Patient: Omar Peters  Procedure(s) Performed: Procedure(s): INSERTION PLEURAL DRAINAGE CATHETER WITH ULTRASOUND AND FLURO GUIDANCE (Right)  Patient Location: PACU  Anesthesia Type:MAC  Level of Consciousness: awake, alert  and oriented  Airway & Oxygen Therapy: Patient Spontanous Breathing and Patient connected to face mask oxygen  Post-op Assessment: Report given to RN, Post -op Vital signs reviewed and stable and Patient moving all extremities  Post vital signs: Reviewed and stable  Last Vitals:  Filed Vitals:   01/15/15 0619  BP: 131/67  Pulse: 71  Temp: 36.9 C  Resp: 16    Complications: No apparent anesthesia complications

## 2015-01-15 NOTE — Anesthesia Preprocedure Evaluation (Addendum)
Anesthesia Evaluation  Patient identified by MRN, date of birth, ID band Patient awake    Reviewed: Allergy & Precautions, NPO status , Patient's Chart, lab work & pertinent test results, reviewed documented beta blocker date and time   Airway Mallampati: II  TM Distance: >3 FB Neck ROM: Full    Dental  (+) Edentulous Upper, Edentulous Lower   Pulmonary shortness of breath and lying, former smoker,  H/O lung cancer         Cardiovascular hypertension, Pt. on home beta blockers     Neuro/Psych    GI/Hepatic GERD-  Controlled and Medicated,  Endo/Other    Renal/GU      Musculoskeletal   Abdominal   Peds  Hematology   Anesthesia Other Findings   Reproductive/Obstetrics                          Anesthesia Physical Anesthesia Plan  ASA: III  Anesthesia Plan: MAC   Post-op Pain Management:    Induction: Intravenous  Airway Management Planned: Natural Airway  Additional Equipment:   Intra-op Plan:   Post-operative Plan:   Informed Consent: I have reviewed the patients History and Physical, chart, labs and discussed the procedure including the risks, benefits and alternatives for the proposed anesthesia with the patient or authorized representative who has indicated his/her understanding and acceptance.     Plan Discussed with: CRNA and Surgeon  Anesthesia Plan Comments:         Anesthesia Quick Evaluation

## 2015-01-15 NOTE — Care Management Note (Signed)
    Page 1 of 2   01/15/2015     10:48:33 AM CARE MANAGEMENT NOTE 01/15/2015  Patient:  Omar Peters, Omar Peters   Account Number:  [id number]  Date Initiated:  01/15/2015  Documentation initiated by:  [facility]  Subjective/Objective Assessment:   80 y.o. male is seen in the office  today for for what appears to be advanced stage IV carcinoma the lung but without definitive tissue DX.  Pt has been a long-term smoker more than 50 years.//Home with spouse     Action/Plan:   Planned surgical procedure placement of right pleural drain.//Home with [place].   Anticipated DC Date:  01/15/2015   Anticipated DC Plan:  [place]  CM consult      [facility] Choice  HOME HEALTH   Choice offered to / List presented to:  C-3 Spouse   DME arranged  CHEST TUBE - PLUEREX        HH arranged  HH-1 RN      [place].   Status of service:  Completed, signed off Medicare Important Message given?   (If response is "NO", the following Medicare IM given date fields will be blank) Date Medicare IM given:   Medicare IM given by:   Date Additional Medicare IM given:   Additional Medicare IM given by:    Discharge Disposition:  [place]  Per UR Regulation:    If discussed at Long Length of Stay Meetings, dates discussed:    Comments:  Payten Beaumier J. Clydene Laming, RN, BSN, [employer] [phone] Spoke with pt at bedside regarding discharge planning for [facility]. Offered pt list of home health agencies to choose from.  Pt chose [facility] to render services. Colletta [place] of [facility] notified. DME needs identified at this time include plureX catheters- case sent home with pt at discharge.

## 2015-01-15 NOTE — Anesthesia Procedure Notes (Signed)
Procedure Name: MAC Date/Time: 01/15/2015 7:33 AM Performed by: Kyung Rudd Pre-anesthesia Checklist: Patient identified, Emergency Drugs available, Suction available, Patient being monitored and Timeout performed Patient Re-evaluated:Patient Re-evaluated prior to inductionOxygen Delivery Method: Simple face mask Intubation Type: IV induction Placement Confirmation: positive ETCO2

## 2015-01-15 NOTE — Anesthesia Postprocedure Evaluation (Signed)
Anesthesia Post Note  Patient: Omar Peters  Procedure(s) Performed: Procedure(s) (LRB): INSERTION PLEURAL DRAINAGE CATHETER WITH ULTRASOUND AND FLURO GUIDANCE (Right)  Anesthesia type: general  Patient location: PACU  Post pain: Pain level controlled  Post assessment: Patient's Cardiovascular Status Stable  Last Vitals:  Filed Vitals:   01/15/15 0918  BP: 111/50  Pulse: 67  Temp:   Resp: 18    Post vital signs: Reviewed and stable  Level of consciousness: sedated  Complications: No apparent anesthesia complications

## 2015-01-15 NOTE — H&P (Signed)
[address]       [place] [address]             [phone]                    [name] [place] Medical Record #[id number] Date of Birth: 1935-07-14  Referring: Dr  Julien Nordmann Primary Care: Tamsen Roers, MD  Chief Complaint:   Lung mass   History of Present Illness:    Omar Peters 80 y.o. male is seen in the office  today for for what appears to be advanced stage IV carcinoma the lung but without definitive tissue diagnosis the patient has been a long-term smoker more than 50 years. 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 Several weeks ago he developed increasing cough with hemoptysis. This led to a chest x-ray and CT scan of the chest. A PET scan has been performed.  The patient notes that currently his breathing has improved he's had no further hemoptysis. He denies any bony pain.   Thoracentesis and needle bx of pelvis confirms none small cell cancer.  Patient called the office today and noted he thought pleural fluid was increasing and he noted increased sob. He came to office today with chest xray.  Cancer of upper lobe of right lung   Staging form: Lung, AJCC 7th Edition     Clinical stage from 12/31/2014: Stage IV (T3, N3, M1b) - Signed by Curt Bears, MD on 01/12/2015  Current Activity/ Functional Status:  Patient is independent with mobility/ambulation, transfers, ADL's, IADL's.   Zubrod Score: At the time of surgery this patient's most appropriate activity status/level should be described as: []     0    Normal activity, no symptoms [x]     1    Restricted in physical strenuous activity but ambulatory, able to do out light work []     2    Ambulatory and capable of self care, unable to do work activities, up and about               >50 % of waking hours                              []     3    Only limited self care, in bed greater than 50%  of waking hours []     4    Completely disabled, no self care, confined to bed or chair []     5    Moribund   Past Medical History  Diagnosis Date  . Hypertension   . Cancer     Lung  . GERD (gastroesophageal reflux disease)   . Shortness of breath dyspnea     01/14/15 at night and has to sit up  . Anxiety     due to diagnosis of Stage 4 Lung Cancer  . Macular degeneration of right eye     Past Surgical History  Procedure Laterality Date  . Vein ligation and stripping  1977    Family History  Problem Relation Age of Onset  . Stroke Mother   . Cancer Father   . Cancer Brother     History   Social History  . Marital Status: Married    Spouse Name: N/A    Number of Children: N/A  . Years of Education: N/A  Occupational History  .  work maintenance in [place] . Patient notes exposure to asbestos    Social History Main Topics  . Smoking status: Former Smoker -- 1.00 packs/day for 50 years    Quit date: 12/14/2014  . Smokeless tobacco: Former Systems developer    Quit date: 12/14/2014  . Alcohol Use: Not on file  . Drug Use: No  . Sexual Activity: Not on file     History  Smoking status  . Former Smoker -- 1.00 packs/day for 50 years  . Quit date: 12/14/2014  Smokeless tobacco  . Former Systems developer  . Quit date: 12/14/2014    History  Alcohol Use  . 4.2 oz/week  . 7 Shots of liquor per week     No Known Allergies  Current Facility-Administered Medications  Medication Dose Route Frequency Provider Last Rate Last Dose  . cefUROXime (ZINACEF) 1.5 g in dextrose 5 % 50 mL IVPB  1.5 g Intravenous 60 min Pre-Op Grace Isaac, MD         Review of Systems:     Cardiac Review of Systems: Y or N  Chest Pain [  n]  Resting SOB [  y ] Exertional SOB  [ y ]  Orthopnea [ n ]   Pedal Edema [n  ]    Palpitations [ n ] Syncope  [  ]   Presyncope [ n  ]  General Review of Systems: [Y] = yes [  ]=no Constitional: recent weight change [n  ];  Wt loss over the  last 3 months [   ] anorexia [  ]; fatigue [ y ]; nausea [  ]; night sweats [  ]; fever [n  ]; or chills [n  ];          Dental: poor dentition[  ]; Last Dentist visit:   Eye : blurred vision [  ]; diplopia [   ]; vision changes [  ];  Amaurosis fugax[  ]; Resp: cough [ y ];  wheezing[ n ];  hemoptysis[y  ]; shortness of breath[y  ]; paroxysmal nocturnal dyspnea[ y ]; dyspnea on exertion[y  ]; or orthopnea[  ];  GI:  gallstones[  ], vomiting[n  ];  dysphagia[ n ]; melena[ n ];  hematochezia [n  ]; heartburn[  ];   Hx of  Colonoscopy[  ]; GU: kidney stones [  ]; hematuria[  ];   dysuria [  ];  nocturia[  ];  history of     obstruction [  ]; urinary frequency [ n ]             Skin: rash, swelling[  ];, hair loss[  ];  peripheral edema[  ];  or itching[  ]; Musculosketetal: myalgias[  ];  joint swelling[n  ];  joint erythema[ n ];  joint pain[ n ];  back pain[ n;  Heme/Lymph: bruising[  ];  bleeding[  ];  anemia[  ];  Neuro: TIA[  ];  headaches[  ];  stroke[  ];  vertigo[  ];  seizures[ n ];   paresthesias[  ];  difficulty walking[n  ];  Psych:depression[  ]; anxiety[  ];  Endocrine: diabetes[  ];  thyroid dysfunction[  ];  Immunizations: Flu up to date [ n]; Pneumococcal up to date [n  ];  Other:  Physical Exam: BP 131/67 mmHg  Pulse 71  Temp(Src) 98.5 F (36.9 C) (Oral)  Resp 16  Ht 5\' 6"  (1.676 m)  Wt 166 lb (75.297 kg)  BMI 26.81 kg/m2  SpO2 96%  PHYSICAL EXAMINATION:  General appearance: alert and cooperative Neurologic: intact Heart: regular rate and rhythm, S1, S2 normal, no murmur, click, rub or gallop Lungs: diminished breath sounds RUL and RLL, absent breath sounds at the right base Abdomen: soft, non-tender; bowel sounds normal; no masses,  no organomegaly Extremities: extremities normal, atraumatic, no cyanosis or edema and Homans sign is negative, no sign of DVT Patient has no cervical supraclavicular axillary adenopathy He has no carotid bruits , distal pulses 2 DP PT  bilaterally are 2+    Diagnostic Studies & Laboratory data:     Recent Radiology Findings Dg Chest 1 View  01/01/2015   CLINICAL DATA:  Initial encounter for right thoracentesis  EXAM: CHEST - 1 VIEW  COMPARISON:  12/15/2014  FINDINGS: Right pleural effusion is noted, potentially loculated. No evidence for pneumothorax status post thoracentesis. Collapse/consolidation noted in the right lower lung. Left lung is clear. Imaged bony structures of the thorax are intact.  IMPRESSION: No evidence for pneumothorax   Electronically Signed   By: Misty Stanley M.D.   On: 01/01/2015 13:53   Dg Chest 2 View  01/14/2015   CLINICAL DATA:  Shortness of breath for 2 weeks.  EXAM: CHEST  2 VIEW  COMPARISON:  01/01/2015.  PET-CT 12/29/2014  FINDINGS: Opacification of the right hemi thorax is again noted consistent with right pleural effusion and underlying right lung atelectasis. There is mild shift of the mediastinum to the right. The distal right mainstem bronchus is not identified. Left lung is clear. Heart size normal. No acute osseous abnormality.  IMPRESSION: Opacification of the right hemithorax is again noted consistent right pleural effusion and underlying atelectasis. Right mainstem bronchus appears occluded suggesting a centrally obstructing lesion. Reference made to PET-CT report of 12/29/2014.   Electronically Signed   By: Marcello Moores  Register   On: 01/14/2015 11:07   Ct Chest W Contrast  12/16/2014   CLINICAL DATA:  80 year old male with right perihilar mass seen on chest x-ray. Cough and hemoptysis for the past 10 days. Prior history of smoking, quit 3 weeks ago.  EXAM: CT CHEST WITH CONTRAST  TECHNIQUE: Multidetector CT imaging of the chest was performed during intravenous contrast administration.  CONTRAST:  1mL OMNIPAQUE IOHEXOL 300 MG/ML  SOLN  COMPARISON:  Chest x-ray 12/15/2014.  FINDINGS: BUN and creatinine were obtained on site at [facility] at  [address]  Results:  BUN 14 mg/dL,   Creatinine 0.8 mg/dL.  Mediastinum: Right hilar lymphadenopathy contiguous with the right upper lobe mass. Right paratracheal lymphadenopathy measuring up to 12 mm in short axis in the lower right paratracheal station. Subcarinal lymphadenopathy measuring up to 12 mm in short axis. Right-sided superior mediastinal lymph node lateral to the proximal esophagus measuring 11 mm in short axis. AP window lymph node measuring 11 mm in short axis. No left hilar adenopathy. Heart size is normal. There is no significant pericardial fluid, thickening or pericardial calcification. There is atherosclerosis of the thoracic aorta, the great vessels of the mediastinum and the coronary arteries, including calcified atherosclerotic plaque in the left anterior descending and right coronary arteries.  Lungs/Pleura: Large right upper lobe mass in the perihilar region contiguous with adjacent right hilar lymphadenopathy. This mass is difficult to discretely measure separate from the adjacent postobstructive lung consolidation/collapse, but is estimated to measure approximately 9.9 x 6.2 x 7.8 cm (images 23 of series 2 and coronal image 64). Abnormal  nodal tissue is also noted around the right middle lobe bronchi, with some mild postobstructive changes in the right middle lobe and to a lesser extent in the right lower lobe. No definite direct mediastinal invasion noted at this time. Large right-sided pleural effusion, predominantly layering dependently, although there is likely a partially loculated component in the right apex adjacent to the right upper lobe mass. Background of diffuse bronchial wall thickening with moderate centrilobular and mild paraseptal emphysema.  Upper Abdomen: Shrunken appearance and nodular contour in the liver, with heterogeneous attenuation throughout the parenchyma, compatible with cirrhosis. Atherosclerosis.  Musculoskeletal: There are no aggressive appearing lytic or blastic lesions noted in the visualized  portions of the skeleton.  IMPRESSION: 1. Findings, as above, highly concerning for primary right upper lobe bronchogenic carcinoma with right hilar and bilateral mediastinal lymphadenopathy, as well as a probable malignant right-sided pleural effusion. Based on these findings, this is compatible with at least T3, N3, Mx disease (i.e., at least stage IIIB). Correlation with PET-CT and/or biopsy is recommended for further diagnostic and staging purposes. 2. Additional incidental findings, as above. These results will be called to the ordering clinician or representative by the Radiologist Assistant, and communication documented in the PACS or zVision Dashboard.   Electronically Signed   By: Vinnie Langton M.D.   On: 12/16/2014 14:28   Mr Jeri Cos FB Contrast  01/11/2015   CLINICAL DATA:  Newly diagnosed right upper lobe lung cancer. Staging.  EXAM: MRI HEAD WITHOUT AND WITH CONTRAST  TECHNIQUE: Multiplanar, multiecho pulse sequences of the brain and surrounding structures were obtained without and with intravenous contrast.  CONTRAST:  43mL MULTIHANCE GADOBENATE DIMEGLUMINE 529 MG/ML IV SOLN  COMPARISON:  None.  FINDINGS: There is no evidence of acute infarct, intracranial hemorrhage, mass, midline shift, or extra-axial fluid collection. There is mild generalized cerebral atrophy. Patchy and confluent T2 hyperintensities in the subcortical and deep cerebral white matter are nonspecific but compatible with mild to moderate chronic small vessel ischemic disease. There is no abnormal enhancement.  Prior right cataract extraction is noted. Focal mucosal thickening/small mucous retention cyst is noted in the left maxillary sinus. Mastoid air cells are clear. Major intracranial vascular flow voids are preserved.  IMPRESSION: 1. No evidence of intracranial metastases. 2. Mild-to-moderate chronic small vessel ischemic disease.   Electronically Signed   By: Logan Bores   On: 01/11/2015 11:32   Nm Pet Image Initial (pi)  Skull Base To Thigh  12/29/2014   CLINICAL DATA:  Initial treatment strategy for right lung mass with mediastinal lymphadenopathy.  EXAM: NUCLEAR MEDICINE PET SKULL BASE TO THIGH  TECHNIQUE: 8.1 mCi F-18 FDG was injected intravenously. Full-ring PET imaging was performed from the skull base to thigh after the radiotracer. CT data was obtained and used for attenuation correction and anatomic localization.  FASTING BLOOD GLUCOSE:  Value: 214 mg/dl  COMPARISON:  None.  FINDINGS: NECK  No hypermetabolic lymph nodes in the neck.  CHEST  Ill-defined right upper lobe pulmonary mass approximately 5.5 cm with SUV max 4.7. The mass is difficult to define on the background of collapse of the upper lobe. Additionally there is a large right pleural effusion occupying greater than 50% of the right hemithorax volume.  Hypermetabolic mediastinal lymph nodes involving the right lower paratracheal, subcarinal, and high right paratracheal nodal stations. Example subcarinal lymph node with SUV max 4.8. Small high right paratracheal lymph node with SUV max 3.2. The left lung is clear.  ABDOMEN/PELVIS  No abnormal hypermetabolic  activity within the liver, pancreas, adrenal glands, or spleen. No hypermetabolic lymph nodes in the abdomen or pelvis.  SKELETON  There is a expansile lytic lesion within the right posterior iliac bone adjacent the SI joint measuring 3.1 x 2.6 cm and with SUV max 5.4. A second hypermetabolic lytic lesion within the right scapula just posterior to the glenoid fossa (image 55).  IMPRESSION: 1. Hypermetabolic right upper lobe mass consistent with primary bronchogenic carcinoma. 2. Hypermetabolic mediastinal nodal metastasis. 3. Large right pleural effusion. 4. Hypermetabolic skeletal metastasis involving the right scapula and right iliac bone.   Electronically Signed   By: Suzy Bouchard M.D.   On: 12/29/2014 13:11   Ct Biopsy  01/07/2015   CLINICAL DATA:  Right iliac bone lesion  EXAM: CT-GUIDED BIOPSY OF A  RIGHT ILIAC BONE LESION.  CORE.  MEDICATIONS AND MEDICAL HISTORY: Versed 1 mg, Fentanyl 50 mcg.  Additional Medications: None.  ANESTHESIA/SEDATION: Moderate sedation time: 5 minutes  PROCEDURE: The procedure, risks, benefits, and alternatives were explained to the patient. Questions regarding the procedure were encouraged and answered. The patient understands and consents to the procedure.  The right gluteal region was prepped with Betadine in a sterile fashion, and a sterile drape was applied covering the operative field. A sterile gown and sterile gloves were used for the procedure.  Under CT guidance, a(n) 17 gauge guide needle was advanced into the right iliac bone lesion. Subsequently 3 18 gauge core biopsies were obtained. The guide needle was removed. Final imaging was performed.  Patient tolerated the procedure well without complication. Vital sign monitoring by nursing staff during the procedure will continue as patient is in the special procedures unit for post procedure observation.  FINDINGS: The images document guide needle placement within the right iliac bone. Post biopsy images demonstrate no hemorrhage.  COMPLICATIONS: None.  IMPRESSION: Successful CT-guided core biopsy of a right iliac bone lesion.   Electronically Signed   By: Maryclare Bean M.D.   On: 01/07/2015 12:45   US Thoracentesis Asp Pleural Space W/img Guide  01/01/2015   INDICATION: Symptomatic right sided pleural effusion  EXAM: US THORACENTESIS ASP PLEURAL SPACE W/IMG GUIDE  COMPARISON:  PET 12/29/14.  MEDICATIONS: None  COMPLICATIONS: None immediate  TECHNIQUE: Informed written consent was obtained from the patient after a discussion of the risks, benefits and alternatives to treatment. A timeout was performed prior to the initiation of the procedure.  Initial ultrasound scanning demonstrates a right pleural effusion. The lower chest was prepped and draped in the usual sterile fashion. 1% lidocaine was used for local anesthesia.  Under  direct ultrasound guidance, a 19 gauge, 7-cm, Yueh catheter was introduced. An ultrasound image was saved for documentation purposes. The thoracentesis was performed. The catheter was removed and a dressing was applied. The patient tolerated the procedure well without immediate post procedural complication. The patient was escorted to have an upright chest radiograph.  FINDINGS: A total of approximately 900 ml of serous fluid was removed. Requested samples were sent to the laboratory.  IMPRESSION: Successful ultrasound-guided right sided thoracentesis yielding 900 ml of pleural fluid.  Read By:  Tsosie Billing PA-C   Electronically Signed   By: Jacqulynn Cadet M.D.   On: 01/01/2015 14:23     I have independently reviewed the above radiology studies  and reviewed the findings with the patient.      Recent Lab Findings: Lab Results  Component Value Date   WBC 8.9 01/15/2015   HGB 17.9* 01/15/2015  HCT 50.4 01/15/2015   PLT 233 01/15/2015   GLUCOSE 240* 12/31/2014   ALT 35 12/31/2014   AST 23 12/31/2014   NA 138 12/31/2014   K 3.7 12/31/2014   CREATININE 1.1 12/31/2014   BUN 16.4 12/31/2014   CO2 30* 12/31/2014   INR 1.11 01/15/2015      Assessment / Plan:   Clinical stage IV carcinoma the right lung primarily involving the right upper lobe but with mediastinal involvement lung, with bony metastasis and a large right pleural effusion  Confirmed by histology   With   rapid recurrence of the right pleural fluid I have discussed with the patient placement of right pleural drain  Tomorrow. Risks and options for treatment discussed . Patient agreeable to  Proceeding  with placement of Pleurx catheter tomorrow.  The goals risks and alternatives of the planned surgical procedure placement of right pleural drain have been discussed with the patient in detail. The risks of the procedure including death, infection, stroke, myocardial infarction, bleeding, blood transfusion have all been  discussed specifically.  I have quoted Clint Guy a 2% of perioperative mortality and a complication rate as high as 20 %. The patient's questions have been answered.OLSEN MCCUTCHAN is willing  to proceed with the planned procedure.   Grace Isaac MD      [address] [place] [address] Office [phone]   Beeper [phone]  01/15/2015 7:06 AM

## 2015-01-18 ENCOUNTER — Encounter (HOSPITAL_COMMUNITY): Payer: Self-pay | Admitting: Cardiothoracic Surgery

## 2015-01-18 NOTE — Op Note (Signed)
Omar Peters, Omar Peters NO.:  [id number]  MEDICAL RECORD NO.:  [id number]  LOCATION:  [facility]                         FACILITY:  [facility]  PHYSICIAN:  Lanelle Bal, MD    DATE OF BIRTH:  [birth year]  DATE OF PROCEDURE:  01/15/2015 DATE OF DISCHARGE:  01/15/2015                              OPERATIVE REPORT  PREOPERATIVE DIAGNOSIS:  Recurrent large right pleural effusion with right hilar lung mass biopsy proven to be carcinoma of the lung with known malignant pleural effusion.  POSTOPERATIVE DIAGNOSIS:  Recurrent large right pleural effusion with right hilar lung mass biopsy proven to be carcinoma of the lung with known malignant pleural effusion.  SURGICAL PROCEDURE:  Placement of right PleurX catheter with ultrasound and fluro guidance.  SURGEON:  Lanelle Bal, MD  BRIEF HISTORY:  The patient is a 80 year old male with a right hilar lung mass who presented with a moderate-size right pleural effusion and right hilar lung mass.  The mass was previously biopsied and a thoracentesis had been performed confirming malignant pleural effusion. The patient had been referred to the [facility] and is to start treatment, however, on followup after his initial course he had reaccumulation of fluid with widening of his right lung.  Because of this, it was recommended to him that we proceed with placement of PleurX catheter rather than repeat thoracentesis.  The patient agreed and signed informed consent.  DESCRIPTION OF PROCEDURE:  With MAC anesthesia and under light intravenous sedation, the patient's right chest was prepped with Betadine and draped in sterile manner.  Appropriate timeout was performed and the previously marked side was confirmed.  Using SonoSite ultrasound, the fluid area was easily identified and 1% lidocaine was infiltrated and a 16-gauge needle Angiocath was introduced into the right chest with easy removal of straw-colored fluid.  The needle  was removed.  Fluoroscopically, the guidewire was placed into the right chest.  More anterior counter incision was made and the PleurX catheter was tunneled between the 2 incisions.  A three-way sheath was introduced over the guidewire, and through this, the PleurX catheter was placed. The incisions were then closed with interrupted 3-0 Vicryl and 4-0 subcuticular stitch.  Dermabond was applied.  We drained approximately 1200-1400 mL of straw-colored fluid, and then rather than draining the patient completely dry had arranged for daily drainage for the next week and monitor output.  The patient tolerated the procedure without difficulty, and was transferred to the recovery room for further postoperative care.     Lanelle Bal, MD     EG/MEDQ  D:  01/18/2015  T:  01/18/2015  Job:  [id number]

## 2015-01-19 ENCOUNTER — Encounter (HOSPITAL_COMMUNITY): Payer: Self-pay

## 2015-01-19 ENCOUNTER — Other Ambulatory Visit: Payer: Self-pay | Admitting: *Deleted

## 2015-01-19 DIAGNOSIS — J984 Other disorders of lung: Secondary | ICD-10-CM

## 2015-01-20 ENCOUNTER — Encounter: Payer: Self-pay | Admitting: *Deleted

## 2015-01-20 ENCOUNTER — Other Ambulatory Visit (HOSPITAL_BASED_OUTPATIENT_CLINIC_OR_DEPARTMENT_OTHER): Payer: Medicare Other

## 2015-01-20 ENCOUNTER — Ambulatory Visit (HOSPITAL_BASED_OUTPATIENT_CLINIC_OR_DEPARTMENT_OTHER): Payer: Medicare Other

## 2015-01-20 ENCOUNTER — Other Ambulatory Visit: Payer: Medicare Other

## 2015-01-20 DIAGNOSIS — C7951 Secondary malignant neoplasm of bone: Secondary | ICD-10-CM

## 2015-01-20 DIAGNOSIS — C3411 Malignant neoplasm of upper lobe, right bronchus or lung: Secondary | ICD-10-CM

## 2015-01-20 DIAGNOSIS — Z5111 Encounter for antineoplastic chemotherapy: Secondary | ICD-10-CM

## 2015-01-20 DIAGNOSIS — J984 Other disorders of lung: Secondary | ICD-10-CM

## 2015-01-20 LAB — COMPREHENSIVE METABOLIC PANEL (CC13)
ALBUMIN: 2.9 g/dL — AB (ref 3.5–5.0)
ALT: 30 U/L (ref 0–55)
AST: 24 U/L (ref 5–34)
Alkaline Phosphatase: 97 U/L (ref 40–150)
Anion Gap: 10 mEq/L (ref 3–11)
BUN: 24 mg/dL (ref 7.0–26.0)
CO2: 24 meq/L (ref 22–29)
CREATININE: 1.1 mg/dL (ref 0.7–1.3)
Calcium: 9.3 mg/dL (ref 8.4–10.4)
Chloride: 104 mEq/L (ref 98–109)
EGFR: 62 mL/min/{1.73_m2} — ABNORMAL LOW (ref >90)
GLUCOSE: 192 mg/dL — AB (ref 70–140)
Potassium: 3.9 mEq/L (ref 3.5–5.1)
Sodium: 138 mEq/L (ref 136–145)
TOTAL PROTEIN: 6.8 g/dL (ref 6.4–8.3)
Total Bilirubin: 0.44 mg/dL (ref 0.20–1.20)

## 2015-01-20 LAB — CBC WITH DIFFERENTIAL/PLATELET
BASO%: 0.8 % (ref 0.0–2.0)
Basophils Absolute: 0.1 10*3/uL (ref 0.0–0.1)
EOS%: 0.4 % (ref 0.0–7.0)
Eosinophils Absolute: 0.1 10*3/uL (ref 0.0–0.5)
HCT: 50.4 % — ABNORMAL HIGH (ref 38.4–49.9)
HGB: 16.6 g/dL (ref 13.0–17.1)
LYMPH%: 6.3 % — ABNORMAL LOW (ref 14.0–49.0)
MCH: 33.3 pg (ref 27.2–33.4)
MCHC: 33 g/dL (ref 32.0–36.0)
MCV: 100.7 fL — ABNORMAL HIGH (ref 79.3–98.0)
MONO#: 0.7 10*3/uL (ref 0.1–0.9)
MONO%: 3.8 % (ref 0.0–14.0)
NEUT#: 16.9 10*3/uL — ABNORMAL HIGH (ref 1.5–6.5)
NEUT%: 88.7 % — AB (ref 39.0–75.0)
PLATELETS: 291 10*3/uL (ref 140–400)
RBC: 5 10*6/uL (ref 4.20–5.82)
RDW: 12 % (ref 11.0–14.6)
WBC: 19.1 10*3/uL — ABNORMAL HIGH (ref 4.0–10.3)
lymph#: 1.2 10*3/uL (ref 0.9–3.3)

## 2015-01-20 MED ORDER — DEXAMETHASONE SODIUM PHOSPHATE 20 MG/5ML IJ SOLN
INTRAMUSCULAR | Status: AC
  Filled 2015-01-20: qty 5

## 2015-01-20 MED ORDER — PEMETREXED DISODIUM CHEMO INJECTION 500 MG
500.0000 mg/m2 | Freq: Once | INTRAVENOUS | Status: AC
  Administered 2015-01-20: 950 mg via INTRAVENOUS
  Filled 2015-01-20: qty 38

## 2015-01-20 MED ORDER — CARBOPLATIN CHEMO INJECTION 600 MG/60ML
425.5000 mg | Freq: Once | INTRAVENOUS | Status: AC
  Administered 2015-01-20: 430 mg via INTRAVENOUS
  Filled 2015-01-20: qty 43

## 2015-01-20 MED ORDER — DEXAMETHASONE SODIUM PHOSPHATE 20 MG/5ML IJ SOLN
20.0000 mg | Freq: Once | INTRAMUSCULAR | Status: AC
  Administered 2015-01-20: 20 mg via INTRAVENOUS

## 2015-01-20 MED ORDER — ONDANSETRON 16 MG/50ML IVPB (CHCC)
16.0000 mg | Freq: Once | INTRAVENOUS | Status: AC
  Administered 2015-01-20: 16 mg via INTRAVENOUS

## 2015-01-20 MED ORDER — ONDANSETRON 16 MG/50ML IVPB (CHCC)
INTRAVENOUS | Status: AC
  Filled 2015-01-20: qty 16

## 2015-01-20 MED ORDER — SODIUM CHLORIDE 0.9 % IV SOLN
Freq: Once | INTRAVENOUS | Status: AC
  Administered 2015-01-20: 14:00:00 via INTRAVENOUS

## 2015-01-20 NOTE — Progress Notes (Signed)
[facility] Psychosocial Distress Screening Clinical Social Work  Clinical Social Work was referred by distress screening protocol.  The patient scored a 9 on the Psychosocial Distress Thermometer which indicates severe distress. Clinical Social Worker met with patient in [place] office to assess for distress and other psychosocial needs. Mr. Vandeberg does not anticipate any needs at this time.  Screen may possibly been completed by patient's spouse.  Patient's spouse reports feeling overwhelmed regarding not knowing what to expect before his first treatment, but feels the issues previously checked to do not apply at this time.  CSW strongly encouraged patient/spouse to follow up with CSW for support.  ONCBCN DISTRESS SCREENING 01/12/2015  Screening Type Initial Screening  Distress experienced in past week (1-10) 9  Practical problem type Housing;Transportation  Emotional problem type Depression;Adjusting to illness  Spiritual/Religous concerns type Relating to God;Facing my mortality  Information Concerns Type Lack of info about diagnosis;Lack of info about treatment;Lack of info about complementary therapy choices;Lack of info about maintaining fitness  Physical Problem type Breathing;Loss of appetitie;Constipation/diarrhea  Physician notified of physical symptoms Yes  Referral to clinical psychology No  Referral to clinical social work Yes  Referral to dietition Yes  Referral to financial advocate No  Referral to support programs Yes  Referral to palliative care No     Clinical Social Work met with patient/spouse  to review and complete healthcare advance directives.  The patient designated spouse Caryl Asp as their primary healthcare agent and daughter Olin Hauser as their secondary agent.  Patient also completed healthcare living will.    Clinical Social Worker notarized documents and made copies for patient/family. Clinical Social Worker will send documents to medical records to be scanned into patient's  chart. Clinical Social Worker encouraged patient/family to contact with any additional questions or concerns.  Polo Riley, MSW, [place] Worker [facility] [phone]

## 2015-01-20 NOTE — Patient Instructions (Signed)
[facility] Discharge Instructions for Patients Receiving Chemotherapy  Today you received the following chemotherapy agents Alimta/Carboplatin.   To help prevent nausea and vomiting after your treatment, we encourage you to take your nausea medication as directed.    If you develop nausea and vomiting that is not controlled by your nausea medication, call the clinic.   BELOW ARE SYMPTOMS THAT SHOULD BE REPORTED IMMEDIATELY:  *FEVER GREATER THAN 100.5 F  *CHILLS WITH OR WITHOUT FEVER  NAUSEA AND VOMITING THAT IS NOT CONTROLLED WITH YOUR NAUSEA MEDICATION  *UNUSUAL SHORTNESS OF BREATH  *UNUSUAL BRUISING OR BLEEDING  TENDERNESS IN MOUTH AND THROAT WITH OR WITHOUT PRESENCE OF ULCERS  *URINARY PROBLEMS  *BOWEL PROBLEMS  UNUSUAL RASH Items with * indicate a potential emergency and should be followed up as soon as possible.  Feel free to call the clinic you have any questions or concerns. The clinic phone number is [phone].

## 2015-01-20 NOTE — Progress Notes (Signed)
Discharged at 1545 in no distress, ambulatory with wife.

## 2015-01-21 ENCOUNTER — Telehealth: Payer: Self-pay | Admitting: *Deleted

## 2015-01-21 NOTE — Telephone Encounter (Signed)
Pt contacted for f/u after 1st Alimta tx: pt reports "I feel good, no problems at all"  Denies N/V/fever; eating/drinking well and verbalized understanding to call [phone] if any problems.

## 2015-01-27 ENCOUNTER — Other Ambulatory Visit (HOSPITAL_BASED_OUTPATIENT_CLINIC_OR_DEPARTMENT_OTHER): Payer: Medicare Other

## 2015-01-27 ENCOUNTER — Telehealth: Payer: Self-pay | Admitting: Internal Medicine

## 2015-01-27 ENCOUNTER — Ambulatory Visit: Payer: Medicare Other | Admitting: Physician Assistant

## 2015-01-27 ENCOUNTER — Ambulatory Visit (HOSPITAL_BASED_OUTPATIENT_CLINIC_OR_DEPARTMENT_OTHER): Payer: Medicare Other | Admitting: Internal Medicine

## 2015-01-27 ENCOUNTER — Encounter: Payer: Self-pay | Admitting: Internal Medicine

## 2015-01-27 VITALS — BP 144/71 | HR 124 | Temp 98.1°F | Resp 18 | Ht 66.0 in | Wt 158.8 lb

## 2015-01-27 DIAGNOSIS — C781 Secondary malignant neoplasm of mediastinum: Secondary | ICD-10-CM

## 2015-01-27 DIAGNOSIS — C7951 Secondary malignant neoplasm of bone: Secondary | ICD-10-CM

## 2015-01-27 DIAGNOSIS — J918 Pleural effusion in other conditions classified elsewhere: Secondary | ICD-10-CM

## 2015-01-27 DIAGNOSIS — C3411 Malignant neoplasm of upper lobe, right bronchus or lung: Secondary | ICD-10-CM

## 2015-01-27 LAB — CBC WITH DIFFERENTIAL/PLATELET
BASO%: 0.6 % (ref 0.0–2.0)
BASOS ABS: 0 10*3/uL (ref 0.0–0.1)
EOS%: 6.3 % (ref 0.0–7.0)
Eosinophils Absolute: 0.4 10*3/uL (ref 0.0–0.5)
HCT: 48.3 % (ref 38.4–49.9)
HEMOGLOBIN: 16.4 g/dL (ref 13.0–17.1)
LYMPH#: 1.8 10*3/uL (ref 0.9–3.3)
LYMPH%: 32.5 % (ref 14.0–49.0)
MCH: 34.2 pg — ABNORMAL HIGH (ref 27.2–33.4)
MCHC: 33.9 g/dL (ref 32.0–36.0)
MCV: 100.7 fL — AB (ref 79.3–98.0)
MONO#: 0.5 10*3/uL (ref 0.1–0.9)
MONO%: 8.6 % (ref 0.0–14.0)
NEUT#: 2.9 10*3/uL (ref 1.5–6.5)
NEUT%: 52 % (ref 39.0–75.0)
Platelets: 147 10*3/uL (ref 140–400)
RBC: 4.8 10*6/uL (ref 4.20–5.82)
RDW: 11.8 % (ref 11.0–14.6)
WBC: 5.6 10*3/uL (ref 4.0–10.3)

## 2015-01-27 LAB — COMPREHENSIVE METABOLIC PANEL (CC13)
ALBUMIN: 2.7 g/dL — AB (ref 3.5–5.0)
ALT: 69 U/L — AB (ref 0–55)
ANION GAP: 12 meq/L — AB (ref 3–11)
AST: 38 U/L — ABNORMAL HIGH (ref 5–34)
Alkaline Phosphatase: 99 U/L (ref 40–150)
BUN: 21.6 mg/dL (ref 7.0–26.0)
CO2: 23 mEq/L (ref 22–29)
Calcium: 8.9 mg/dL (ref 8.4–10.4)
Chloride: 103 mEq/L (ref 98–109)
Creatinine: 0.7 mg/dL (ref 0.7–1.3)
EGFR: 90 mL/min/{1.73_m2} — AB (ref >90)
GLUCOSE: 125 mg/dL (ref 70–140)
Potassium: 3.8 mEq/L (ref 3.5–5.1)
SODIUM: 137 meq/L (ref 136–145)
TOTAL PROTEIN: 6.1 g/dL — AB (ref 6.4–8.3)
Total Bilirubin: 0.62 mg/dL (ref 0.20–1.20)

## 2015-01-27 NOTE — Telephone Encounter (Signed)
Gave avs & calendar for February & March. Sent message to verify appointment/ Sent message to schedule treatmeant

## 2015-01-27 NOTE — Progress Notes (Signed)
[facility] [phone]   Fax:([phone]) [place], [place] [address] [place] [address]  DIAGNOSIS: Stage IV (T3, N3, M1b) non-small cell lung cancer, adenocarcinoma diagnosed in January 2016 presented with right upper lobe lung mass in addition to mediastinal lymphadenopathy and large right pleural effusion as well as bone metastasis.  PRIOR THERAPY: Status post Pleurx catheter placement by Dr. Servando Snare for drainage of the right pleural effusion  CURRENT THERAPY: Systemic chemotherapy with carboplatin for AUC of 5 and Alimta 500 MG/M2 every 3 weeks. Status post one cycle.  INTERVAL HISTORY: Omar Peters 80 y.o. male returns to the clinic today for follow-up visit accompanied by his wife. The patient is feeling fine today was no specific complaints. He tolerated the first week of his chemotherapy fairly well. He denied having any significant nausea or vomiting, no fever or chills. He denied having any significant chest pain but has shortness breath with exertion with no cough or hemoptysis. He had Pleurx catheter placed by Dr. Madolyn Frieze and he currently drain around 300 ML of pleural fluid every other day. The patient is here today for evaluation and management any adverse effect of his treatment.  MEDICAL HISTORY: Past Medical History  Diagnosis Date  . Hypertension   . Cancer     Lung  . GERD (gastroesophageal reflux disease)   . Shortness of breath dyspnea     01/14/15 at night and has to sit up  . Anxiety     due to diagnosis of Stage 4 Lung Cancer  . Macular degeneration of right eye     ALLERGIES:  has No Known Allergies.  MEDICATIONS:  Current Outpatient Prescriptions  Medication Sig Dispense Refill  . amLODipine (NORVASC) 10 MG tablet Take 10 mg by mouth daily.    Marland Kitchen aspirin 81 MG tablet Take 81 mg by mouth daily.    Marland Kitchen dexamethasone (DECADRON) 4 MG tablet Take 1 tablet by mouth twice a day with food, the day  before, day of and day after chemotherapy 40 tablet 0  . doxycycline (ADOXA) 50 MG tablet Take 50 mg by mouth daily.     . fluconazole (DIFLUCAN) 100 MG tablet Take 2 tablets by mouth on day one, then take 1 tablet by mouth daily until completed 16 tablet 0  . folic acid (FOLVITE) 1 MG tablet Take 1 tablet (1 mg total) by mouth daily. 30 tablet 3  . HYDROcodone-acetaminophen (NORCO) 5-325 MG per tablet Take 1 tablet by mouth every 6 (six) hours as needed for moderate pain. 30 tablet 0  . losartan-hydrochlorothiazide (HYZAAR) 100-25 MG per tablet Take 1 tablet by mouth daily.    . metoprolol (LOPRESSOR) 50 MG tablet Take 50 mg by mouth daily.     . ranitidine (ZANTAC) 150 MG tablet Take 150 mg by mouth daily.    . prochlorperazine (COMPAZINE) 10 MG tablet Take 1 tablet (10 mg total) by mouth every 6 (six) hours as needed for nausea or vomiting. (Patient not taking: Reported on 01/27/2015) 30 tablet 0   No current facility-administered medications for this visit.    SURGICAL HISTORY:  Past Surgical History  Procedure Laterality Date  . Vein ligation and stripping  1977  . Chest tube insertion Right 01/15/2015    Procedure: INSERTION PLEURAL DRAINAGE CATHETER WITH ULTRASOUND AND FLURO GUIDANCE;  Surgeon: Grace Isaac, MD;  Location: [facility];  Service: Thoracic;  Laterality: Right;  REVIEW OF SYSTEMS:  A comprehensive review of systems was negative except for: Respiratory: positive for dyspnea on exertion   PHYSICAL EXAMINATION: General appearance: alert, cooperative and no distress Head: Normocephalic, without obvious abnormality, atraumatic Neck: no adenopathy, no JVD, supple, symmetrical, trachea midline and thyroid not enlarged, symmetric, no tenderness/mass/nodules Lymph nodes: Cervical, supraclavicular, and axillary nodes normal. Resp: clear to auscultation bilaterally Back: symmetric, no curvature. ROM normal. No CVA tenderness. Cardio: regular rate and rhythm, S1, S2 normal, no  murmur, click, rub or gallop GI: soft, non-tender; bowel sounds normal; no masses,  no organomegaly Extremities: extremities normal, atraumatic, no cyanosis or edema  ECOG PERFORMANCE STATUS: 1 - Symptomatic but completely ambulatory  Blood pressure 144/71, pulse 124, temperature 98.1 F (36.7 C), temperature source Oral, resp. rate 18, height 5\' 6"  (1.676 m), weight 158 lb 12.8 oz (72.031 kg), SpO2 98 %.  LABORATORY DATA: Lab Results  Component Value Date   WBC 5.6 01/27/2015   HGB 16.4 01/27/2015   HCT 48.3 01/27/2015   MCV 100.7* 01/27/2015   PLT 147 01/27/2015      Chemistry      Component Value Date/Time   NA 137 01/27/2015 1322   NA 138 01/15/2015 0608   K 3.8 01/27/2015 1322   K 3.5 01/15/2015 0608   CL 97 01/15/2015 0608   CO2 23 01/27/2015 1322   CO2 34* 01/15/2015 0608   BUN 21.6 01/27/2015 1322   BUN 13 01/15/2015 0608   CREATININE 0.7 01/27/2015 1322   CREATININE 0.96 01/15/2015 0608      Component Value Date/Time   CALCIUM 8.9 01/27/2015 1322   CALCIUM 9.4 01/15/2015 0608   ALKPHOS 99 01/27/2015 1322   ALKPHOS 102 01/15/2015 0608   AST 38* 01/27/2015 1322   AST 42* 01/15/2015 0608   ALT 69* 01/27/2015 1322   ALT 38 01/15/2015 0608   BILITOT 0.62 01/27/2015 1322   BILITOT 0.8 01/15/2015 0608       RADIOGRAPHIC STUDIES: Dg Chest 1 View  01/01/2015   CLINICAL DATA:  Initial encounter for right thoracentesis  EXAM: CHEST - 1 VIEW  COMPARISON:  12/15/2014  FINDINGS: Right pleural effusion is noted, potentially loculated. No evidence for pneumothorax status post thoracentesis. Collapse/consolidation noted in the right lower lung. Left lung is clear. Imaged bony structures of the thorax are intact.  IMPRESSION: No evidence for pneumothorax   Electronically Signed   By: Misty Stanley M.D.   On: 01/01/2015 13:53   Dg Chest 2 View  01/14/2015   CLINICAL DATA:  Shortness of breath for 2 weeks.  EXAM: CHEST  2 VIEW  COMPARISON:  01/01/2015.  PET-CT 12/29/2014   FINDINGS: Opacification of the right hemi thorax is again noted consistent with right pleural effusion and underlying right lung atelectasis. There is mild shift of the mediastinum to the right. The distal right mainstem bronchus is not identified. Left lung is clear. Heart size normal. No acute osseous abnormality.  IMPRESSION: Opacification of the right hemithorax is again noted consistent right pleural effusion and underlying atelectasis. Right mainstem bronchus appears occluded suggesting a centrally obstructing lesion. Reference made to PET-CT report of 12/29/2014.   Electronically Signed   By: Marcello Moores  Register   On: 01/14/2015 11:07   Mr Jeri Cos Wo Contrast  01/11/2015   CLINICAL DATA:  Newly diagnosed right upper lobe lung cancer. Staging.  EXAM: MRI HEAD WITHOUT AND WITH CONTRAST  TECHNIQUE: Multiplanar, multiecho pulse sequences of the brain and surrounding structures were obtained without  and with intravenous contrast.  CONTRAST:  23mL MULTIHANCE GADOBENATE DIMEGLUMINE 529 MG/ML IV SOLN  COMPARISON:  None.  FINDINGS: There is no evidence of acute infarct, intracranial hemorrhage, mass, midline shift, or extra-axial fluid collection. There is mild generalized cerebral atrophy. Patchy and confluent T2 hyperintensities in the subcortical and deep cerebral white matter are nonspecific but compatible with mild to moderate chronic small vessel ischemic disease. There is no abnormal enhancement.  Prior right cataract extraction is noted. Focal mucosal thickening/small mucous retention cyst is noted in the left maxillary sinus. Mastoid air cells are clear. Major intracranial vascular flow voids are preserved.  IMPRESSION: 1. No evidence of intracranial metastases. 2. Mild-to-moderate chronic small vessel ischemic disease.   Electronically Signed   By: Logan Bores   On: 01/11/2015 11:32   Nm Pet Image Initial (pi) Skull Base To Thigh  12/29/2014   CLINICAL DATA:  Initial treatment strategy for right lung  mass with mediastinal lymphadenopathy.  EXAM: NUCLEAR MEDICINE PET SKULL BASE TO THIGH  TECHNIQUE: 8.1 mCi F-18 FDG was injected intravenously. Full-ring PET imaging was performed from the skull base to thigh after the radiotracer. CT data was obtained and used for attenuation correction and anatomic localization.  FASTING BLOOD GLUCOSE:  Value: 214 mg/dl  COMPARISON:  None.  FINDINGS: NECK  No hypermetabolic lymph nodes in the neck.  CHEST  Ill-defined right upper lobe pulmonary mass approximately 5.5 cm with SUV max 4.7. The mass is difficult to define on the background of collapse of the upper lobe. Additionally there is a large right pleural effusion occupying greater than 50% of the right hemithorax volume.  Hypermetabolic mediastinal lymph nodes involving the right lower paratracheal, subcarinal, and high right paratracheal nodal stations. Example subcarinal lymph node with SUV max 4.8. Small high right paratracheal lymph node with SUV max 3.2. The left lung is clear.  ABDOMEN/PELVIS  No abnormal hypermetabolic activity within the liver, pancreas, adrenal glands, or spleen. No hypermetabolic lymph nodes in the abdomen or pelvis.  SKELETON  There is a expansile lytic lesion within the right posterior iliac bone adjacent the SI joint measuring 3.1 x 2.6 cm and with SUV max 5.4. A second hypermetabolic lytic lesion within the right scapula just posterior to the glenoid fossa (image 55).  IMPRESSION: 1. Hypermetabolic right upper lobe mass consistent with primary bronchogenic carcinoma. 2. Hypermetabolic mediastinal nodal metastasis. 3. Large right pleural effusion. 4. Hypermetabolic skeletal metastasis involving the right scapula and right iliac bone.   Electronically Signed   By: Suzy Bouchard M.D.   On: 12/29/2014 13:11   Ct Biopsy  01/07/2015   CLINICAL DATA:  Right iliac bone lesion  EXAM: CT-GUIDED BIOPSY OF A RIGHT ILIAC BONE LESION.  CORE.  MEDICATIONS AND MEDICAL HISTORY: Versed 1 mg, Fentanyl 50  mcg.  Additional Medications: None.  ANESTHESIA/SEDATION: Moderate sedation time: 5 minutes  PROCEDURE: The procedure, risks, benefits, and alternatives were explained to the patient. Questions regarding the procedure were encouraged and answered. The patient understands and consents to the procedure.  The right gluteal region was prepped with Betadine in a sterile fashion, and a sterile drape was applied covering the operative field. A sterile gown and sterile gloves were used for the procedure.  Under CT guidance, a(n) 17 gauge guide needle was advanced into the right iliac bone lesion. Subsequently 3 18 gauge core biopsies were obtained. The guide needle was removed. Final imaging was performed.  Patient tolerated the procedure well without complication. Vital sign monitoring by  nursing staff during the procedure will continue as patient is in the special procedures unit for post procedure observation.  FINDINGS: The images document guide needle placement within the right iliac bone. Post biopsy images demonstrate no hemorrhage.  COMPLICATIONS: None.  IMPRESSION: Successful CT-guided core biopsy of a right iliac bone lesion.   Electronically Signed   By: Maryclare Bean M.D.   On: 01/07/2015 12:45   Dg Chest Port 1 View  01/15/2015   CLINICAL DATA:  History of lung cancer and placement of pleural drainage catheter.  EXAM: PORTABLE CHEST - 1 VIEW  COMPARISON:  01/14/2017  FINDINGS: There continues to be complete opacification of the right hemithorax. A pleural drain was placed in the right lower chest. No significant pleural air. Trachea remains midline. Heart size is grossly stable. Left lung is clear.  IMPRESSION: Persistent opacification of the right hemithorax related to a large pleural effusion and volume loss.  Interval placement of a right pleural drain.   Electronically Signed   By: Markus Daft M.D.   On: 01/15/2015 09:06   Dg C-arm 1-60 Min-no Report  01/21/2015   : Fluoroscopy was utilized by the  requesting physician. No radiographic interpretation.   Electronically Signed   By: Porfirio Mylar   On: 01/21/2015 07:57   US Thoracentesis Asp Pleural Space W/img Guide  01/01/2015   INDICATION: Symptomatic right sided pleural effusion  EXAM: US THORACENTESIS ASP PLEURAL SPACE W/IMG GUIDE  COMPARISON:  PET 12/29/14.  MEDICATIONS: None  COMPLICATIONS: None immediate  TECHNIQUE: Informed written consent was obtained from the patient after a discussion of the risks, benefits and alternatives to treatment. A timeout was performed prior to the initiation of the procedure.  Initial ultrasound scanning demonstrates a right pleural effusion. The lower chest was prepped and draped in the usual sterile fashion. 1% lidocaine was used for local anesthesia.  Under direct ultrasound guidance, a 19 gauge, 7-cm, Yueh catheter was introduced. An ultrasound image was saved for documentation purposes. The thoracentesis was performed. The catheter was removed and a dressing was applied. The patient tolerated the procedure well without immediate post procedural complication. The patient was escorted to have an upright chest radiograph.  FINDINGS: A total of approximately 900 ml of serous fluid was removed. Requested samples were sent to the laboratory.  IMPRESSION: Successful ultrasound-guided right sided thoracentesis yielding 900 ml of pleural fluid.  Read By:  Tsosie Billing PA-C   Electronically Signed   By: Jacqulynn Cadet M.D.   On: 01/01/2015 14:23    ASSESSMENT AND PLAN: This is a very pleasant 80 years old white male recently diagnosed with a stage IV non-small cell lung cancer, adenocarcinoma and currently undergoing systemic chemotherapy with carboplatin and Alimta status post 1 cycle. He is tolerating his treatment fairly well with no significant adverse effects. I recommended for the patient to continue his chemotherapy as scheduled. He would come back for follow-up visit in 2 weeks with the next cycle of his  chemotherapy. For the right sided pleural effusion, he will continue with drainage of the pleural fluid via the Pleurx catheter. The patient was advised to call immediately if he has any concerning symptoms in the interval. The patient voices understanding of current disease status and treatment options and is in agreement with the current care plan.  All questions were answered. The patient knows to call the clinic with any problems, questions or concerns. We can certainly see the patient much sooner if necessary.  Disclaimer: This note  was dictated with voice recognition software. Similar sounding words can inadvertently be transcribed and may not be corrected upon review.

## 2015-01-29 ENCOUNTER — Telehealth: Payer: Self-pay | Admitting: Oncology

## 2015-01-29 NOTE — Telephone Encounter (Signed)
Confirm appointment for 02/22 & 02/24

## 2015-02-02 DIAGNOSIS — J910 Malignant pleural effusion: Secondary | ICD-10-CM

## 2015-02-02 DIAGNOSIS — C3490 Malignant neoplasm of unspecified part of unspecified bronchus or lung: Secondary | ICD-10-CM

## 2015-02-03 ENCOUNTER — Other Ambulatory Visit (HOSPITAL_BASED_OUTPATIENT_CLINIC_OR_DEPARTMENT_OTHER): Payer: Medicare Other

## 2015-02-03 DIAGNOSIS — C3411 Malignant neoplasm of upper lobe, right bronchus or lung: Secondary | ICD-10-CM

## 2015-02-03 LAB — COMPREHENSIVE METABOLIC PANEL (CC13)
ALBUMIN: 2.8 g/dL — AB (ref 3.5–5.0)
ALK PHOS: 113 U/L (ref 40–150)
ALT: 69 U/L — ABNORMAL HIGH (ref 0–55)
ANION GAP: 9 meq/L (ref 3–11)
AST: 38 U/L — AB (ref 5–34)
BUN: 12.9 mg/dL (ref 7.0–26.0)
CO2: 27 mEq/L (ref 22–29)
CREATININE: 0.8 mg/dL (ref 0.7–1.3)
Calcium: 8.8 mg/dL (ref 8.4–10.4)
Chloride: 104 mEq/L (ref 98–109)
EGFR: 86 mL/min/{1.73_m2} — AB (ref >90)
Glucose: 221 mg/dl — ABNORMAL HIGH (ref 70–140)
Potassium: 3.6 mEq/L (ref 3.5–5.1)
Sodium: 140 mEq/L (ref 136–145)
Total Bilirubin: 0.48 mg/dL (ref 0.20–1.20)
Total Protein: 6 g/dL — ABNORMAL LOW (ref 6.4–8.3)

## 2015-02-03 LAB — CBC WITH DIFFERENTIAL/PLATELET
BASO%: 0.9 % (ref 0.0–2.0)
Basophils Absolute: 0 10*3/uL (ref 0.0–0.1)
EOS%: 5.4 % (ref 0.0–7.0)
Eosinophils Absolute: 0.3 10*3/uL (ref 0.0–0.5)
HCT: 47.1 % (ref 38.4–49.9)
HGB: 15.6 g/dL (ref 13.0–17.1)
LYMPH#: 1.6 10*3/uL (ref 0.9–3.3)
LYMPH%: 31.5 % (ref 14.0–49.0)
MCH: 33.2 pg (ref 27.2–33.4)
MCHC: 33.1 g/dL (ref 32.0–36.0)
MCV: 100.3 fL — ABNORMAL HIGH (ref 79.3–98.0)
MONO#: 0.8 10*3/uL (ref 0.1–0.9)
MONO%: 16 % — ABNORMAL HIGH (ref 0.0–14.0)
NEUT%: 46.2 % (ref 39.0–75.0)
NEUTROS ABS: 2.4 10*3/uL (ref 1.5–6.5)
Platelets: 167 10*3/uL (ref 140–400)
RBC: 4.7 10*6/uL (ref 4.20–5.82)
RDW: 11.8 % (ref 11.0–14.6)
WBC: 5.2 10*3/uL (ref 4.0–10.3)

## 2015-02-08 ENCOUNTER — Other Ambulatory Visit (HOSPITAL_BASED_OUTPATIENT_CLINIC_OR_DEPARTMENT_OTHER): Payer: Medicare Other

## 2015-02-08 ENCOUNTER — Ambulatory Visit (HOSPITAL_BASED_OUTPATIENT_CLINIC_OR_DEPARTMENT_OTHER): Payer: Medicare Other | Admitting: Oncology

## 2015-02-08 ENCOUNTER — Encounter: Payer: Self-pay | Admitting: Oncology

## 2015-02-08 VITALS — BP 106/47 | HR 78 | Temp 98.5°F | Resp 18 | Ht 66.0 in | Wt 158.5 lb

## 2015-02-08 DIAGNOSIS — C3411 Malignant neoplasm of upper lobe, right bronchus or lung: Secondary | ICD-10-CM | POA: Diagnosis not present

## 2015-02-08 DIAGNOSIS — C7951 Secondary malignant neoplasm of bone: Secondary | ICD-10-CM | POA: Diagnosis not present

## 2015-02-08 DIAGNOSIS — J90 Pleural effusion, not elsewhere classified: Secondary | ICD-10-CM | POA: Diagnosis not present

## 2015-02-08 LAB — COMPREHENSIVE METABOLIC PANEL (CC13)
ALT: 49 U/L (ref 0–55)
AST: 32 U/L (ref 5–34)
Albumin: 2.7 g/dL — ABNORMAL LOW (ref 3.5–5.0)
Alkaline Phosphatase: 112 U/L (ref 40–150)
Anion Gap: 8 mEq/L (ref 3–11)
BUN: 12.8 mg/dL (ref 7.0–26.0)
CO2: 29 mEq/L (ref 22–29)
Calcium: 9 mg/dL (ref 8.4–10.4)
Chloride: 102 mEq/L (ref 98–109)
Creatinine: 0.9 mg/dL (ref 0.7–1.3)
EGFR: 84 mL/min/{1.73_m2} — AB (ref >90)
GLUCOSE: 170 mg/dL — AB (ref 70–140)
Potassium: 3.7 mEq/L (ref 3.5–5.1)
Sodium: 139 mEq/L (ref 136–145)
Total Bilirubin: 0.37 mg/dL (ref 0.20–1.20)
Total Protein: 6 g/dL — ABNORMAL LOW (ref 6.4–8.3)

## 2015-02-08 LAB — CBC WITH DIFFERENTIAL/PLATELET
BASO%: 0.4 % (ref 0.0–2.0)
Basophils Absolute: 0 10*3/uL (ref 0.0–0.1)
EOS ABS: 0.3 10*3/uL (ref 0.0–0.5)
EOS%: 6.3 % (ref 0.0–7.0)
HCT: 43.3 % (ref 38.4–49.9)
HGB: 15.1 g/dL (ref 13.0–17.1)
LYMPH%: 38.3 % (ref 14.0–49.0)
MCH: 34.5 pg — ABNORMAL HIGH (ref 27.2–33.4)
MCHC: 34.9 g/dL (ref 32.0–36.0)
MCV: 98.9 fL — ABNORMAL HIGH (ref 79.3–98.0)
MONO#: 1 10*3/uL — ABNORMAL HIGH (ref 0.1–0.9)
MONO%: 20.5 % — AB (ref 0.0–14.0)
NEUT#: 1.7 10*3/uL (ref 1.5–6.5)
NEUT%: 34.5 % — ABNORMAL LOW (ref 39.0–75.0)
Platelets: 238 10*3/uL (ref 140–400)
RBC: 4.38 10*6/uL (ref 4.20–5.82)
RDW: 12.3 % (ref 11.0–14.6)
WBC: 4.8 10*3/uL (ref 4.0–10.3)
lymph#: 1.8 10*3/uL (ref 0.9–3.3)

## 2015-02-08 NOTE — Progress Notes (Signed)
[facility] [phone]   Fax:(336) [place], MD [address] [place] [address]  DIAGNOSIS: Stage IV (T3, N3, M1b) non-small cell lung cancer, adenocarcinoma diagnosed in January 2016 presented with right upper lobe lung mass in addition to mediastinal lymphadenopathy and large right pleural effusion as well as bone metastasis.  PRIOR THERAPY: Status post Pleurx catheter placement by Dr. Servando Snare for drainage of the right pleural effusion  CURRENT THERAPY: Systemic chemotherapy with carboplatin for AUC of 5 and Alimta 500 MG/M2 every 3 weeks. Status post one cycle.  INTERVAL HISTORY: Omar Peters 80 y.o. male returns to the clinic today for follow-up visit accompanied by his wife. The patient is feeling fine today. His only complaint is of constipation. Started taking stool softeners yesterday. He tolerated the first cycle of his chemotherapy fairly well. He denied having any significant nausea or vomiting, no fever or chills. He denied having any significant chest pain but has shortness breath with exertion with no cough or hemoptysis. He had Pleurx catheter placed by Dr. Madolyn Frieze and he currently drain around 450-500 ML of pleural fluid every other day. He is due for cycle 2 on 02/10/2015.  MEDICAL HISTORY: Past Medical History  Diagnosis Date  . Hypertension   . Cancer     Lung  . GERD (gastroesophageal reflux disease)   . Shortness of breath dyspnea     01/14/15 at night and has to sit up  . Anxiety     due to diagnosis of Stage 4 Lung Cancer  . Macular degeneration of right eye     ALLERGIES:  has No Known Allergies.  MEDICATIONS:  Current Outpatient Prescriptions  Medication Sig Dispense Refill  . amLODipine (NORVASC) 10 MG tablet Take 10 mg by mouth daily.    Marland Kitchen aspirin 81 MG tablet Take 81 mg by mouth daily.    Marland Kitchen dexamethasone (DECADRON) 4 MG tablet Take 1 tablet by mouth twice a day with food, the  day before, day of and day after chemotherapy 40 tablet 0  . folic acid (FOLVITE) 1 MG tablet Take 1 tablet (1 mg total) by mouth daily. 30 tablet 3  . HYDROcodone-acetaminophen (NORCO) 5-325 MG per tablet Take 1 tablet by mouth every 6 (six) hours as needed for moderate pain. 30 tablet 0  . losartan-hydrochlorothiazide (HYZAAR) 100-25 MG per tablet Take 1 tablet by mouth daily.    . metoprolol (LOPRESSOR) 50 MG tablet Take 50 mg by mouth daily.     . prochlorperazine (COMPAZINE) 10 MG tablet Take 1 tablet (10 mg total) by mouth every 6 (six) hours as needed for nausea or vomiting. (Patient not taking: Reported on 01/27/2015) 30 tablet 0  . ranitidine (ZANTAC) 150 MG tablet Take 150 mg by mouth daily.     No current facility-administered medications for this visit.    SURGICAL HISTORY:  Past Surgical History  Procedure Laterality Date  . Vein ligation and stripping  1977  . Chest tube insertion Right 01/15/2015    Procedure: INSERTION PLEURAL DRAINAGE CATHETER WITH ULTRASOUND AND FLURO GUIDANCE;  Surgeon: Grace Isaac, MD;  Location: [facility];  Service: Thoracic;  Laterality: Right;    REVIEW OF SYSTEMS:  A comprehensive review of systems was negative except for: Respiratory: positive for dyspnea on exertion   PHYSICAL EXAMINATION: General appearance: alert, cooperative and no distress Head: Normocephalic, without obvious abnormality, atraumatic Neck: no adenopathy, no JVD, supple, symmetrical,  trachea midline and thyroid not enlarged, symmetric, no tenderness/mass/nodules Lymph nodes: Cervical, supraclavicular, and axillary nodes normal. Resp: clear to auscultation bilaterally Back: symmetric, no curvature. ROM normal. No CVA tenderness. Cardio: regular rate and rhythm, S1, S2 normal, no murmur, click, rub or gallop GI: soft, non-tender; bowel sounds normal; no masses,  no organomegaly Extremities: extremities normal, atraumatic, no cyanosis or edema  ECOG PERFORMANCE STATUS: 1 -  Symptomatic but completely ambulatory  Blood pressure 106/47, pulse 78, temperature 98.5 F (36.9 C), temperature source Oral, resp. rate 18, height 5\' 6"  (1.676 m), weight 158 lb 8 oz (71.895 kg), SpO2 97 %.  LABORATORY DATA: Lab Results  Component Value Date   WBC 4.8 02/08/2015   HGB 15.1 02/08/2015   HCT 43.3 02/08/2015   MCV 98.9* 02/08/2015   PLT 238 02/08/2015      Chemistry      Component Value Date/Time   NA 139 02/08/2015 1421   NA 138 01/15/2015 0608   K 3.7 02/08/2015 1421   K 3.5 01/15/2015 0608   CL 97 01/15/2015 0608   CO2 29 02/08/2015 1421   CO2 34* 01/15/2015 0608   BUN 12.8 02/08/2015 1421   BUN 13 01/15/2015 0608   CREATININE 0.9 02/08/2015 1421   CREATININE 0.96 01/15/2015 0608      Component Value Date/Time   CALCIUM 9.0 02/08/2015 1421   CALCIUM 9.4 01/15/2015 0608   ALKPHOS 112 02/08/2015 1421   ALKPHOS 102 01/15/2015 0608   AST 32 02/08/2015 1421   AST 42* 01/15/2015 0608   ALT 49 02/08/2015 1421   ALT 38 01/15/2015 0608   BILITOT 0.37 02/08/2015 1421   BILITOT 0.8 01/15/2015 0608       RADIOGRAPHIC STUDIES: Dg Chest 2 View  01/14/2015   CLINICAL DATA:  Shortness of breath for 2 weeks.  EXAM: CHEST  2 VIEW  COMPARISON:  01/01/2015.  PET-CT 12/29/2014  FINDINGS: Opacification of the right hemi thorax is again noted consistent with right pleural effusion and underlying right lung atelectasis. There is mild shift of the mediastinum to the right. The distal right mainstem bronchus is not identified. Left lung is clear. Heart size normal. No acute osseous abnormality.  IMPRESSION: Opacification of the right hemithorax is again noted consistent right pleural effusion and underlying atelectasis. Right mainstem bronchus appears occluded suggesting a centrally obstructing lesion. Reference made to PET-CT report of 12/29/2014.   Electronically Signed   By: Marcello Moores  Register   On: 01/14/2015 11:07   Mr Jeri Cos Wo Contrast  01/11/2015   CLINICAL DATA:   Newly diagnosed right upper lobe lung cancer. Staging.  EXAM: MRI HEAD WITHOUT AND WITH CONTRAST  TECHNIQUE: Multiplanar, multiecho pulse sequences of the brain and surrounding structures were obtained without and with intravenous contrast.  CONTRAST:  59mL MULTIHANCE GADOBENATE DIMEGLUMINE 529 MG/ML IV SOLN  COMPARISON:  None.  FINDINGS: There is no evidence of acute infarct, intracranial hemorrhage, mass, midline shift, or extra-axial fluid collection. There is mild generalized cerebral atrophy. Patchy and confluent T2 hyperintensities in the subcortical and deep cerebral white matter are nonspecific but compatible with mild to moderate chronic small vessel ischemic disease. There is no abnormal enhancement.  Prior right cataract extraction is noted. Focal mucosal thickening/small mucous retention cyst is noted in the left maxillary sinus. Mastoid air cells are clear. Major intracranial vascular flow voids are preserved.  IMPRESSION: 1. No evidence of intracranial metastases. 2. Mild-to-moderate chronic small vessel ischemic disease.   Electronically Signed   By:  Logan Bores   On: 01/11/2015 11:32   Dg Chest Port 1 View  01/15/2015   CLINICAL DATA:  History of lung cancer and placement of pleural drainage catheter.  EXAM: PORTABLE CHEST - 1 VIEW  COMPARISON:  01/14/2017  FINDINGS: There continues to be complete opacification of the right hemithorax. A pleural drain was placed in the right lower chest. No significant pleural air. Trachea remains midline. Heart size is grossly stable. Left lung is clear.  IMPRESSION: Persistent opacification of the right hemithorax related to a large pleural effusion and volume loss.  Interval placement of a right pleural drain.   Electronically Signed   By: Markus Daft M.D.   On: 01/15/2015 09:06   Dg C-arm 1-60 Min-no Report  01/21/2015   : Fluoroscopy was utilized by the requesting physician. No radiographic interpretation.   Electronically Signed   By: Porfirio Mylar   On:  01/21/2015 07:57    ASSESSMENT AND PLAN: This is a very pleasant 80 years old white male recently diagnosed with a stage IV non-small cell lung cancer, adenocarcinoma and currently undergoing systemic chemotherapy with carboplatin and Alimta status post 1 cycle.  He is tolerating his treatment fairly well with no significant adverse effects. Recommend that he proceed with cycle 2 of his chemotherapy on 02/10/2015 as scheduled. He will continue to have weekly labs. He will have a return visit in 3 weeks.  For the right sided pleural effusion, he will continue with drainage of the pleural fluid via the Pleurx catheter.  He was instructed to use a stool softener twice a day and add MiraLAX daily for his constipation.  Of note, his blood pressure is on the low side today at 106/47. He is on a number of blood pressure medications but is asymptomatic with this low blood pressure. We have advised him to follow-up with his primary care provider to discuss blood pressure medication management.  The patient was advised to call immediately if he has any concerning symptoms in the interval. The patient voices understanding of current disease status and treatment options and is in agreement with the current care plan.  All questions were answered. The patient knows to call the clinic with any problems, questions or concerns. We can certainly see the patient much sooner if necessary.  The patient was seen and examined with Dr. Julien Nordmann.  Mikey Bussing, DNP, AGPCNP-BC, AOCNP  ADDENDUM: Hematology/Oncology Attending: I had a face to face encounter with the patient. I recommended his care plan. This is a very pleasant 80 years old white male with a stage IV non-small cell lung cancer, adenocarcinoma. He is currently undergoing systemic chemotherapy was carboplatin and Alimta status post 1 cycle. The patient tolerated the first cycle of his treatment fairly well was no significant adverse effects. He continues  to have drainage of the right pleural effusion around 500 ML every other day. He denied having any significant shortness of breath except with exertion. We will proceed with cycle #2 as a scheduled in 2 days. The patient would come back for follow-up visit in 3 weeks with the start of cycle #3. He was advised to call immediately if he has any concerning symptoms in the interval.  Disclaimer: This note was dictated with voice recognition software. Similar sounding words can inadvertently be transcribed and may be missed upon review. Eilleen Kempf., MD 02/08/2015

## 2015-02-08 NOTE — Patient Instructions (Signed)
Constipation:  Use a stool softener twice a day. Add MiraLax 1 capful mixed in water or juice daily.

## 2015-02-10 ENCOUNTER — Other Ambulatory Visit: Payer: Self-pay | Admitting: Internal Medicine

## 2015-02-10 ENCOUNTER — Ambulatory Visit: Payer: Medicare Other | Admitting: Nutrition

## 2015-02-10 ENCOUNTER — Ambulatory Visit (HOSPITAL_BASED_OUTPATIENT_CLINIC_OR_DEPARTMENT_OTHER): Payer: Medicare Other

## 2015-02-10 ENCOUNTER — Other Ambulatory Visit: Payer: Medicare Other

## 2015-02-10 DIAGNOSIS — C7951 Secondary malignant neoplasm of bone: Secondary | ICD-10-CM

## 2015-02-10 DIAGNOSIS — Z5111 Encounter for antineoplastic chemotherapy: Secondary | ICD-10-CM

## 2015-02-10 DIAGNOSIS — C3411 Malignant neoplasm of upper lobe, right bronchus or lung: Secondary | ICD-10-CM

## 2015-02-10 MED ORDER — PEMETREXED DISODIUM CHEMO INJECTION 500 MG
500.0000 mg/m2 | Freq: Once | INTRAVENOUS | Status: AC
  Administered 2015-02-10: 950 mg via INTRAVENOUS
  Filled 2015-02-10: qty 38

## 2015-02-10 MED ORDER — ONDANSETRON 16 MG/50ML IVPB (CHCC)
16.0000 mg | Freq: Once | INTRAVENOUS | Status: AC
  Administered 2015-02-10: 16 mg via INTRAVENOUS

## 2015-02-10 MED ORDER — DEXAMETHASONE SODIUM PHOSPHATE 20 MG/5ML IJ SOLN
20.0000 mg | Freq: Once | INTRAMUSCULAR | Status: AC
Start: 2015-02-10 — End: 2015-02-10
  Administered 2015-02-10: 20 mg via INTRAVENOUS

## 2015-02-10 MED ORDER — CARBOPLATIN CHEMO INJECTION 600 MG/60ML
456.0000 mg | Freq: Once | INTRAVENOUS | Status: AC
  Administered 2015-02-10: 460 mg via INTRAVENOUS
  Filled 2015-02-10: qty 46

## 2015-02-10 MED ORDER — DEXAMETHASONE SODIUM PHOSPHATE 20 MG/5ML IJ SOLN
INTRAMUSCULAR | Status: AC
  Filled 2015-02-10: qty 5

## 2015-02-10 MED ORDER — SODIUM CHLORIDE 0.9 % IV SOLN
Freq: Once | INTRAVENOUS | Status: AC
  Administered 2015-02-10: 11:00:00 via INTRAVENOUS

## 2015-02-10 MED ORDER — ONDANSETRON 16 MG/50ML IVPB (CHCC)
INTRAVENOUS | Status: AC
  Filled 2015-02-10: qty 16

## 2015-02-10 NOTE — Patient Instructions (Signed)
[facility] Discharge Instructions for Patients Receiving Chemotherapy  Today you received the following chemotherapy agents Alimta/Carboplatin.   To help prevent nausea and vomiting after your treatment, we encourage you to take your nausea medication as directed.    If you develop nausea and vomiting that is not controlled by your nausea medication, call the clinic.   BELOW ARE SYMPTOMS THAT SHOULD BE REPORTED IMMEDIATELY:  *FEVER GREATER THAN 100.5 F  *CHILLS WITH OR WITHOUT FEVER  NAUSEA AND VOMITING THAT IS NOT CONTROLLED WITH YOUR NAUSEA MEDICATION  *UNUSUAL SHORTNESS OF BREATH  *UNUSUAL BRUISING OR BLEEDING  TENDERNESS IN MOUTH AND THROAT WITH OR WITHOUT PRESENCE OF ULCERS  *URINARY PROBLEMS  *BOWEL PROBLEMS  UNUSUAL RASH Items with * indicate a potential emergency and should be followed up as soon as possible.  Feel free to call the clinic you have any questions or concerns. The clinic phone number is [phone].

## 2015-02-10 NOTE — Progress Notes (Signed)
Patient identified to be at risk for malnutrition on the MST secondary to poor appetite and weight loss.  80 year old male diagnosed with non-small cell lung cancer.  He is a patient of Dr. Earlie Server.  Past medical history includes hypertension, GERD, anxiety, and macular degeneration.  Medications include Decadron, Diflucan, and Compazine.  Labs include glucose 221, and albumin 2.8 on February 17.  Height: 66 inches. Weight: 158.8 pounds. Usual body weight: 171 pounds January 2016. BMI: 25.64.  Patient reports appetite is fair to good. Patient has increased oral intake over the last several weeks. Patient does endorse weight loss recently. He is drinking one bottle of Premier protein daily.   Patient denies nutrition impact symptoms.  Nutrition diagnosis: Inadequate oral intake related to diagnosis of lung cancer as evidenced by 7% weight loss from usual body weight.  Intervention: Patient was educated to consume small meals and snacks containing high-protein foods. Educated patient on sources of protein.  Provided fact sheet on increasing calories and protein. Patient should continue to drink one premier protein daily. Questions were answered.  Teach back method used.  Monitoring, evaluation, goals: Patient will tolerate adequate calories and protein to minimize further weight loss.  Next visit: To be followed as needed.  **Disclaimer: This note was dictated with voice recognition software. Similar sounding words can inadvertently be transcribed and this note may contain transcription errors which may not have been corrected upon publication of note.**

## 2015-02-17 ENCOUNTER — Other Ambulatory Visit (HOSPITAL_BASED_OUTPATIENT_CLINIC_OR_DEPARTMENT_OTHER): Payer: Medicare Other

## 2015-02-17 DIAGNOSIS — C3411 Malignant neoplasm of upper lobe, right bronchus or lung: Secondary | ICD-10-CM

## 2015-02-17 DIAGNOSIS — C7951 Secondary malignant neoplasm of bone: Secondary | ICD-10-CM

## 2015-02-17 LAB — COMPREHENSIVE METABOLIC PANEL (CC13)
ALBUMIN: 2.7 g/dL — AB (ref 3.5–5.0)
ALT: 56 U/L — AB (ref 0–55)
ANION GAP: 11 meq/L (ref 3–11)
AST: 38 U/L — AB (ref 5–34)
Alkaline Phosphatase: 111 U/L (ref 40–150)
BUN: 13.9 mg/dL (ref 7.0–26.0)
CALCIUM: 9.2 mg/dL (ref 8.4–10.4)
CO2: 28 meq/L (ref 22–29)
Chloride: 100 mEq/L (ref 98–109)
Creatinine: 0.8 mg/dL (ref 0.7–1.3)
EGFR: 86 mL/min/{1.73_m2} — ABNORMAL LOW (ref >90)
Glucose: 202 mg/dl — ABNORMAL HIGH (ref 70–140)
POTASSIUM: 4.3 meq/L (ref 3.5–5.1)
Sodium: 139 mEq/L (ref 136–145)
Total Bilirubin: 0.87 mg/dL (ref 0.20–1.20)
Total Protein: 6.3 g/dL — ABNORMAL LOW (ref 6.4–8.3)

## 2015-02-17 LAB — CBC WITH DIFFERENTIAL/PLATELET
BASO%: 0.3 % (ref 0.0–2.0)
BASOS ABS: 0 10*3/uL (ref 0.0–0.1)
EOS ABS: 0 10*3/uL (ref 0.0–0.5)
EOS%: 1.3 % (ref 0.0–7.0)
HEMATOCRIT: 43.8 % (ref 38.4–49.9)
HEMOGLOBIN: 15.2 g/dL (ref 13.0–17.1)
LYMPH#: 1.6 10*3/uL (ref 0.9–3.3)
LYMPH%: 51.7 % — ABNORMAL HIGH (ref 14.0–49.0)
MCH: 34.5 pg — AB (ref 27.2–33.4)
MCHC: 34.7 g/dL (ref 32.0–36.0)
MCV: 99.3 fL — ABNORMAL HIGH (ref 79.3–98.0)
MONO#: 0.5 10*3/uL (ref 0.1–0.9)
MONO%: 14.8 % — ABNORMAL HIGH (ref 0.0–14.0)
NEUT%: 31.9 % — AB (ref 39.0–75.0)
NEUTROS ABS: 1 10*3/uL — AB (ref 1.5–6.5)
Platelets: 136 10*3/uL — ABNORMAL LOW (ref 140–400)
RBC: 4.41 10*6/uL (ref 4.20–5.82)
RDW: 12.4 % (ref 11.0–14.6)
WBC: 3.2 10*3/uL — ABNORMAL LOW (ref 4.0–10.3)

## 2015-02-24 ENCOUNTER — Other Ambulatory Visit (HOSPITAL_BASED_OUTPATIENT_CLINIC_OR_DEPARTMENT_OTHER): Payer: Medicare Other

## 2015-02-24 DIAGNOSIS — C3411 Malignant neoplasm of upper lobe, right bronchus or lung: Secondary | ICD-10-CM

## 2015-02-24 LAB — COMPREHENSIVE METABOLIC PANEL (CC13)
ALT: 51 U/L (ref 0–55)
AST: 46 U/L — ABNORMAL HIGH (ref 5–34)
Albumin: 2.7 g/dL — ABNORMAL LOW (ref 3.5–5.0)
Alkaline Phosphatase: 119 U/L (ref 40–150)
Anion Gap: 12 mEq/L — ABNORMAL HIGH (ref 3–11)
BUN: 8.7 mg/dL (ref 7.0–26.0)
CALCIUM: 8.9 mg/dL (ref 8.4–10.4)
CO2: 27 mEq/L (ref 22–29)
Chloride: 102 mEq/L (ref 98–109)
Creatinine: 0.8 mg/dL (ref 0.7–1.3)
EGFR: 87 mL/min/{1.73_m2} — AB (ref >90)
GLUCOSE: 223 mg/dL — AB (ref 70–140)
POTASSIUM: 3.8 meq/L (ref 3.5–5.1)
Sodium: 140 mEq/L (ref 136–145)
TOTAL PROTEIN: 6.2 g/dL — AB (ref 6.4–8.3)
Total Bilirubin: 0.33 mg/dL (ref 0.20–1.20)

## 2015-02-24 LAB — CBC WITH DIFFERENTIAL/PLATELET
BASO%: 0.2 % (ref 0.0–2.0)
Basophils Absolute: 0 10*3/uL (ref 0.0–0.1)
EOS ABS: 0 10*3/uL (ref 0.0–0.5)
EOS%: 1 % (ref 0.0–7.0)
HCT: 38.7 % (ref 38.4–49.9)
HEMOGLOBIN: 13.8 g/dL (ref 13.0–17.1)
LYMPH%: 34.8 % (ref 14.0–49.0)
MCH: 34.8 pg — AB (ref 27.2–33.4)
MCHC: 35.7 g/dL (ref 32.0–36.0)
MCV: 97.7 fL (ref 79.3–98.0)
MONO#: 0.7 10*3/uL (ref 0.1–0.9)
MONO%: 18.3 % — ABNORMAL HIGH (ref 0.0–14.0)
NEUT#: 1.9 10*3/uL (ref 1.5–6.5)
NEUT%: 45.7 % (ref 39.0–75.0)
Platelets: 137 10*3/uL — ABNORMAL LOW (ref 140–400)
RBC: 3.96 10*6/uL — ABNORMAL LOW (ref 4.20–5.82)
RDW: 12.4 % (ref 11.0–14.6)
WBC: 4.1 10*3/uL (ref 4.0–10.3)
lymph#: 1.4 10*3/uL (ref 0.9–3.3)

## 2015-03-03 ENCOUNTER — Other Ambulatory Visit: Payer: Self-pay | Admitting: *Deleted

## 2015-03-03 ENCOUNTER — Ambulatory Visit (HOSPITAL_BASED_OUTPATIENT_CLINIC_OR_DEPARTMENT_OTHER): Payer: Medicare Other | Admitting: Nurse Practitioner

## 2015-03-03 ENCOUNTER — Encounter: Payer: Self-pay | Admitting: Nurse Practitioner

## 2015-03-03 ENCOUNTER — Ambulatory Visit (HOSPITAL_BASED_OUTPATIENT_CLINIC_OR_DEPARTMENT_OTHER): Payer: Medicare Other

## 2015-03-03 ENCOUNTER — Ambulatory Visit: Payer: Medicare Other

## 2015-03-03 ENCOUNTER — Telehealth: Payer: Self-pay | Admitting: Nurse Practitioner

## 2015-03-03 VITALS — BP 128/52 | HR 103 | Temp 98.3°F | Resp 18 | Ht 66.0 in | Wt 162.5 lb

## 2015-03-03 DIAGNOSIS — C3411 Malignant neoplasm of upper lobe, right bronchus or lung: Secondary | ICD-10-CM

## 2015-03-03 DIAGNOSIS — R739 Hyperglycemia, unspecified: Secondary | ICD-10-CM

## 2015-03-03 DIAGNOSIS — C7951 Secondary malignant neoplasm of bone: Secondary | ICD-10-CM

## 2015-03-03 DIAGNOSIS — Z5111 Encounter for antineoplastic chemotherapy: Secondary | ICD-10-CM

## 2015-03-03 LAB — COMPREHENSIVE METABOLIC PANEL (CC13)
ALBUMIN: 2.6 g/dL — AB (ref 3.5–5.0)
ALK PHOS: 115 U/L (ref 40–150)
ALT: 29 U/L (ref 0–55)
AST: 16 U/L (ref 5–34)
Anion Gap: 12 mEq/L — ABNORMAL HIGH (ref 3–11)
BILIRUBIN TOTAL: 0.28 mg/dL (ref 0.20–1.20)
BUN: 13.1 mg/dL (ref 7.0–26.0)
CO2: 26 mEq/L (ref 22–29)
Calcium: 9.3 mg/dL (ref 8.4–10.4)
Chloride: 99 mEq/L (ref 98–109)
Creatinine: 1 mg/dL (ref 0.7–1.3)
EGFR: 71 mL/min/{1.73_m2} — AB (ref >90)
Glucose: 470 mg/dl — ABNORMAL HIGH (ref 70–140)
POTASSIUM: 4.1 meq/L (ref 3.5–5.1)
Sodium: 136 mEq/L (ref 136–145)
Total Protein: 6.3 g/dL — ABNORMAL LOW (ref 6.4–8.3)

## 2015-03-03 LAB — CBC WITH DIFFERENTIAL/PLATELET
BASO%: 0.1 % (ref 0.0–2.0)
BASOS ABS: 0 10*3/uL (ref 0.0–0.1)
EOS%: 0.1 % (ref 0.0–7.0)
Eosinophils Absolute: 0 10*3/uL (ref 0.0–0.5)
HCT: 38 % — ABNORMAL LOW (ref 38.4–49.9)
HGB: 13.4 g/dL (ref 13.0–17.1)
LYMPH#: 1.4 10*3/uL (ref 0.9–3.3)
LYMPH%: 16.4 % (ref 14.0–49.0)
MCH: 34.6 pg — AB (ref 27.2–33.4)
MCHC: 35.3 g/dL (ref 32.0–36.0)
MCV: 98.2 fL — ABNORMAL HIGH (ref 79.3–98.0)
MONO#: 1.3 10*3/uL — ABNORMAL HIGH (ref 0.1–0.9)
MONO%: 15.4 % — ABNORMAL HIGH (ref 0.0–14.0)
NEUT#: 5.9 10*3/uL (ref 1.5–6.5)
NEUT%: 68 % (ref 39.0–75.0)
Platelets: 330 10*3/uL (ref 140–400)
RBC: 3.87 10*6/uL — ABNORMAL LOW (ref 4.20–5.82)
RDW: 13.6 % (ref 11.0–14.6)
WBC: 8.7 10*3/uL (ref 4.0–10.3)

## 2015-03-03 MED ORDER — SODIUM CHLORIDE 0.9 % IV SOLN
Freq: Once | INTRAVENOUS | Status: AC
  Administered 2015-03-03: 12:00:00 via INTRAVENOUS
  Filled 2015-03-03: qty 8

## 2015-03-03 MED ORDER — CYANOCOBALAMIN 1000 MCG/ML IJ SOLN
INTRAMUSCULAR | Status: AC
  Filled 2015-03-03: qty 1

## 2015-03-03 MED ORDER — SODIUM CHLORIDE 0.9 % IV SOLN: Freq: Once | INTRAVENOUS | Status: DC

## 2015-03-03 MED ORDER — SODIUM CHLORIDE 0.9 % IJ SOLN
10.0000 mL | INTRAMUSCULAR | Status: DC | PRN
  Filled 2015-03-03: qty 10

## 2015-03-03 MED ORDER — CYANOCOBALAMIN 1000 MCG/ML IJ SOLN
1000.0000 ug | Freq: Once | INTRAMUSCULAR | Status: AC
  Administered 2015-03-03: 1000 ug via INTRAMUSCULAR

## 2015-03-03 MED ORDER — SODIUM CHLORIDE 0.9 % IV SOLN
Freq: Once | INTRAVENOUS | Status: AC
  Administered 2015-03-03: 12:00:00 via INTRAVENOUS

## 2015-03-03 MED ORDER — SODIUM CHLORIDE 0.9 % IV SOLN
485.0000 mg/m2 | Freq: Once | INTRAVENOUS | Status: AC
  Administered 2015-03-03: 900 mg via INTRAVENOUS
  Filled 2015-03-03: qty 36

## 2015-03-03 MED ORDER — SODIUM CHLORIDE 0.9 % IV SOLN
456.0000 mg | Freq: Once | INTRAVENOUS | Status: AC
  Administered 2015-03-03: 460 mg via INTRAVENOUS
  Filled 2015-03-03: qty 46

## 2015-03-03 MED ORDER — HEPARIN SOD (PORK) LOCK FLUSH 100 UNIT/ML IV SOLN
500.0000 [IU] | Freq: Once | INTRAVENOUS | Status: DC | PRN
  Filled 2015-03-03: qty 5

## 2015-03-03 MED ORDER — INSULIN REGULAR HUMAN 100 UNIT/ML IJ SOLN
10.0000 [IU] | Freq: Once | INTRAMUSCULAR | Status: AC
  Administered 2015-03-03: 10 [IU] via SUBCUTANEOUS
  Filled 2015-03-03: qty 0.1

## 2015-03-03 NOTE — Patient Instructions (Signed)
[facility] Discharge Instructions for Patients Receiving Chemotherapy  Today you received the following chemotherapy agents Alimta and Carboplatin.  To help prevent nausea and vomiting after your treatment, we encourage you to take your nausea medication as prescribed.   If you develop nausea and vomiting that is not controlled by your nausea medication, call the clinic.   BELOW ARE SYMPTOMS THAT SHOULD BE REPORTED IMMEDIATELY:  *FEVER GREATER THAN 100.5 F  *CHILLS WITH OR WITHOUT FEVER  NAUSEA AND VOMITING THAT IS NOT CONTROLLED WITH YOUR NAUSEA MEDICATION  *UNUSUAL SHORTNESS OF BREATH  *UNUSUAL BRUISING OR BLEEDING  TENDERNESS IN MOUTH AND THROAT WITH OR WITHOUT PRESENCE OF ULCERS  *URINARY PROBLEMS  *BOWEL PROBLEMS  UNUSUAL RASH Items with * indicate a potential emergency and should be followed up as soon as possible.  Feel free to call the clinic you have any questions or concerns. The clinic phone number is [phone].  Please show the [facility] at check to the Emergency Department and triage nurse.

## 2015-03-03 NOTE — Progress Notes (Signed)
[facility] [phone]   Fax:(336) [place], MD [address] [place] [address]  DIAGNOSIS: Stage IV (T3, N3, M1b) non-small cell lung cancer, adenocarcinoma diagnosed in January 2016 presented with right upper lobe lung mass in addition to mediastinal lymphadenopathy and large right pleural effusion as well as bone metastasis.  PRIOR THERAPY: Status post Pleurx catheter placement by Dr. Servando Snare for drainage of the right pleural effusion  CURRENT THERAPY: Systemic chemotherapy with carboplatin for AUC of 5 and Alimta 500 MG/M2 every 3 weeks. Status post two cycles.  INTERVAL HISTORY: Omar Peters 80 y.o. male returns to the clinic today for follow-up visit accompanied by his wife. He is due for cycle 3 of treatment today. Overall he is tolerating treatments well. His main complaint is his lack of appetite. He is using Boost and Premier Protein shakes to supplement his diet. He has also tried splitting his day into 4-5 small meals daily. His constipation has improved with miralax and stool softeners. He denies fevers, chills, nausea, or vomiting. He has some shortness of breath with exertion but denies cough or hemoptysis. He has no pain. He continues to have his pleurex drained every other day. This week there has been about 400cc drains every time.    MEDICAL HISTORY: Past Medical History  Diagnosis Date  . Hypertension   . Cancer     Lung  . GERD (gastroesophageal reflux disease)   . Shortness of breath dyspnea     01/14/15 at night and has to sit up  . Anxiety     due to diagnosis of Stage 4 Lung Cancer  . Macular degeneration of right eye     ALLERGIES:  has No Known Allergies.  MEDICATIONS:  Current Outpatient Prescriptions  Medication Sig Dispense Refill  . amLODipine (NORVASC) 10 MG tablet Take 10 mg by mouth daily.    Marland Kitchen aspirin 81 MG tablet Take 81 mg by mouth daily.    Marland Kitchen dexamethasone  (DECADRON) 4 MG tablet Take 1 tablet by mouth twice a day with food, the day before, day of and day after chemotherapy 40 tablet 0  . folic acid (FOLVITE) 1 MG tablet Take 1 tablet (1 mg total) by mouth daily. 30 tablet 3  . losartan-hydrochlorothiazide (HYZAAR) 100-25 MG per tablet Take 1 tablet by mouth daily.    . metoprolol (LOPRESSOR) 50 MG tablet Take 50 mg by mouth daily.     . ranitidine (ZANTAC) 150 MG tablet Take 150 mg by mouth daily.    Marland Kitchen HYDROcodone-acetaminophen (NORCO) 5-325 MG per tablet Take 1 tablet by mouth every 6 (six) hours as needed for moderate pain. (Patient not taking: Reported on 03/03/2015) 30 tablet 0  . prochlorperazine (COMPAZINE) 10 MG tablet Take 1 tablet (10 mg total) by mouth every 6 (six) hours as needed for nausea or vomiting. (Patient not taking: Reported on 01/27/2015) 30 tablet 0   No current facility-administered medications for this visit.    SURGICAL HISTORY:  Past Surgical History  Procedure Laterality Date  . Vein ligation and stripping  1977  . Chest tube insertion Right 01/15/2015    Procedure: INSERTION PLEURAL DRAINAGE CATHETER WITH ULTRASOUND AND FLURO GUIDANCE;  Surgeon: Grace Isaac, MD;  Location: [facility];  Service: Thoracic;  Laterality: Right;    REVIEW OF SYSTEMS:  A comprehensive review of systems was negative except for: Respiratory: positive for dyspnea on exertion Gastrointestinal: positive  for constipation and lack of appetite.    PHYSICAL EXAMINATION: General appearance: alert, cooperative and no distress Head: Normocephalic, without obvious abnormality, atraumatic Neck: no adenopathy, no JVD, supple, symmetrical, trachea midline and thyroid not enlarged, symmetric, no tenderness/mass/nodules Lymph nodes: Cervical, supraclavicular, and axillary nodes normal. Resp: clear to auscultation bilaterally Back: symmetric, no curvature. ROM normal. No CVA tenderness. Cardio: regular rate and rhythm, S1, S2 normal, no murmur, click, rub  or gallop GI: soft, non-tender; bowel sounds normal; no masses,  no organomegaly Extremities: extremities normal, atraumatic, no cyanosis or edema  ECOG PERFORMANCE STATUS: 1 - Symptomatic but completely ambulatory  Blood pressure 128/52, pulse 103, temperature 98.3 F (36.8 C), temperature source Oral, resp. rate 18, height 5\' 6"  (1.676 m), weight 162 lb 8 oz (73.71 kg).  LABORATORY DATA: Lab Results  Component Value Date   WBC 8.7 03/03/2015   HGB 13.4 03/03/2015   HCT 38.0* 03/03/2015   MCV 98.2* 03/03/2015   PLT 330 03/03/2015      Chemistry      Component Value Date/Time   NA 136 03/03/2015 1006   NA 138 01/15/2015 0608   K 4.1 03/03/2015 1006   K 3.5 01/15/2015 0608   CL 97 01/15/2015 0608   CO2 26 03/03/2015 1006   CO2 34* 01/15/2015 0608   BUN 13.1 03/03/2015 1006   BUN 13 01/15/2015 0608   CREATININE 1.0 03/03/2015 1006   CREATININE 0.96 01/15/2015 0608      Component Value Date/Time   CALCIUM 9.3 03/03/2015 1006   CALCIUM 9.4 01/15/2015 0608   ALKPHOS 115 03/03/2015 1006   ALKPHOS 102 01/15/2015 0608   AST 16 03/03/2015 1006   AST 42* 01/15/2015 0608   ALT 29 03/03/2015 1006   ALT 38 01/15/2015 0608   BILITOT 0.28 03/03/2015 1006   BILITOT 0.8 01/15/2015 0608       RADIOGRAPHIC STUDIES: No results found.  ASSESSMENT AND PLAN: This is a very pleasant 80 years old white male recently diagnosed with a stage IV non-small cell lung cancer, adenocarcinoma and currently undergoing systemic chemotherapy with carboplatin and Alimta status post 2 cycles.  Omar Peters is doing well today. He will proceed with cycle 3 of treatment as planned today.   The labs were reviewed in detail and his blood sugar is elevated to 470 today. I consulted with Dr. Mckinley Jewel who shared the visit.  He advised the patient to avoid high carb/high sugar foods, and to eliminate the oral dexamethasone from his regimen today and tomorrow, and the IV dex contained in his premeds.  The  patient should also plan to visit his PCP, Dr. Rex Kras to address his hyperglycemia, as his sugars are elevated even when off of the steroids.  I advised the patient to stick to the Premier Protein shakes as they have significantly less added sugar than the Boost supplements. I have also placed orders for 10 units humolog to be given when the patient arrives in the treatment room.   Therron will have a repeat CT of the chest, abdomen, and pelvis 2 days before his next visit here.  He will continue weekly labs. His next office visit will be in 3 weeks, prior to the start of cycle 4. At this time he will review the results of his scans.   He understands and agrees with this plan. He has been encouraged to call with any issues that might arise before his next visit here.   Laurie Panda, NP 03/03/2015  ADDENDUM: Hematology/Oncology Attending:  I had a face to face encounter with the patient. I recommended his care plan. This is a very pleasant 80 years old white male with metastatic non-small cell lung cancer, adenocarcinoma was currently undergoing systemic chemotherapy with carboplatin and Alimta status post 2 cycles. The patient is doing fine and tolerating his treatment fairly well. He continues to have drainage from the right Pleurx catheter around 400 mL every other day.  I recommended for the patient to proceed with cycle #3 of his chemotherapy today as scheduled. He has hyperglycemia with blood sugar of 470 today. I will give the patient a dose of 10 units regular insulin today. He was also advised to contact his primary care physician for evaluation and management of his hyperglycemia. I also advised the patient to discontinue the Decadron premedication. He would come back for follow-up visit in 3 weeks for evaluation after repeating CT scan of the chest, abdomen and pelvis for restaging of his disease. He was advised to call immediately if he has any concerning symptoms in the  interval.  Disclaimer: This note was dictated with voice recognition software. Similar sounding words can inadvertently be transcribed and may be missed upon review. Eilleen Kempf., MD 03/03/2015

## 2015-03-03 NOTE — Telephone Encounter (Signed)
appts made and avs printed for pt,contrast given

## 2015-03-10 ENCOUNTER — Other Ambulatory Visit (HOSPITAL_BASED_OUTPATIENT_CLINIC_OR_DEPARTMENT_OTHER): Payer: Medicare Other

## 2015-03-10 ENCOUNTER — Ambulatory Visit (HOSPITAL_BASED_OUTPATIENT_CLINIC_OR_DEPARTMENT_OTHER): Payer: Medicare Other | Admitting: Nurse Practitioner

## 2015-03-10 ENCOUNTER — Telehealth: Payer: Self-pay | Admitting: *Deleted

## 2015-03-10 ENCOUNTER — Encounter: Payer: Self-pay | Admitting: Nurse Practitioner

## 2015-03-10 ENCOUNTER — Ambulatory Visit: Payer: Medicare Other

## 2015-03-10 DIAGNOSIS — C7951 Secondary malignant neoplasm of bone: Secondary | ICD-10-CM

## 2015-03-10 DIAGNOSIS — K5900 Constipation, unspecified: Secondary | ICD-10-CM

## 2015-03-10 DIAGNOSIS — R432 Parageusia: Secondary | ICD-10-CM

## 2015-03-10 DIAGNOSIS — R110 Nausea: Secondary | ICD-10-CM | POA: Diagnosis not present

## 2015-03-10 DIAGNOSIS — R531 Weakness: Secondary | ICD-10-CM

## 2015-03-10 DIAGNOSIS — C3411 Malignant neoplasm of upper lobe, right bronchus or lung: Secondary | ICD-10-CM

## 2015-03-10 DIAGNOSIS — W19XXXA Unspecified fall, initial encounter: Secondary | ICD-10-CM | POA: Insufficient documentation

## 2015-03-10 DIAGNOSIS — E860 Dehydration: Secondary | ICD-10-CM

## 2015-03-10 DIAGNOSIS — E8809 Other disorders of plasma-protein metabolism, not elsewhere classified: Secondary | ICD-10-CM

## 2015-03-10 DIAGNOSIS — J90 Pleural effusion, not elsewhere classified: Secondary | ICD-10-CM | POA: Diagnosis not present

## 2015-03-10 DIAGNOSIS — R739 Hyperglycemia, unspecified: Secondary | ICD-10-CM | POA: Diagnosis not present

## 2015-03-10 DIAGNOSIS — R630 Anorexia: Secondary | ICD-10-CM

## 2015-03-10 DIAGNOSIS — R112 Nausea with vomiting, unspecified: Secondary | ICD-10-CM

## 2015-03-10 LAB — COMPREHENSIVE METABOLIC PANEL (CC13)
ALBUMIN: 2.4 g/dL — AB (ref 3.5–5.0)
ALK PHOS: 79 U/L (ref 40–150)
ALT: 44 U/L (ref 0–55)
AST: 24 U/L (ref 5–34)
Anion Gap: 8 mEq/L (ref 3–11)
BUN: 22.1 mg/dL (ref 7.0–26.0)
CALCIUM: 9.4 mg/dL (ref 8.4–10.4)
CO2: 27 mEq/L (ref 22–29)
CREATININE: 1 mg/dL (ref 0.7–1.3)
Chloride: 95 mEq/L — ABNORMAL LOW (ref 98–109)
EGFR: 73 mL/min/{1.73_m2} — ABNORMAL LOW (ref >90)
Glucose: 434 mg/dl — ABNORMAL HIGH (ref 70–140)
POTASSIUM: 4.3 meq/L (ref 3.5–5.1)
Sodium: 131 mEq/L — ABNORMAL LOW (ref 136–145)
Total Bilirubin: 0.86 mg/dL (ref 0.20–1.20)
Total Protein: 5.8 g/dL — ABNORMAL LOW (ref 6.4–8.3)

## 2015-03-10 LAB — CBC WITH DIFFERENTIAL/PLATELET
BASO%: 0.2 % (ref 0.0–2.0)
BASOS ABS: 0 10*3/uL (ref 0.0–0.1)
EOS ABS: 0 10*3/uL (ref 0.0–0.5)
EOS%: 0 % (ref 0.0–7.0)
HCT: 36.8 % — ABNORMAL LOW (ref 38.4–49.9)
HGB: 12.9 g/dL — ABNORMAL LOW (ref 13.0–17.1)
LYMPH%: 20.1 % (ref 14.0–49.0)
MCH: 34.2 pg — ABNORMAL HIGH (ref 27.2–33.4)
MCHC: 35.1 g/dL (ref 32.0–36.0)
MCV: 97.6 fL (ref 79.3–98.0)
MONO#: 0.9 10*3/uL (ref 0.1–0.9)
MONO%: 19.5 % — AB (ref 0.0–14.0)
NEUT%: 60.2 % (ref 39.0–75.0)
NEUTROS ABS: 2.8 10*3/uL (ref 1.5–6.5)
NRBC: 0 % (ref 0–0)
PLATELETS: 130 10*3/uL — AB (ref 140–400)
RBC: 3.77 10*6/uL — AB (ref 4.20–5.82)
RDW: 13.7 % (ref 11.0–14.6)
WBC: 4.7 10*3/uL (ref 4.0–10.3)
lymph#: 0.9 10*3/uL (ref 0.9–3.3)

## 2015-03-10 LAB — WHOLE BLOOD GLUCOSE
Glucose: 231 mg/dL — ABNORMAL HIGH (ref 70–100)
HRS PC: 1 h

## 2015-03-10 MED ORDER — ONDANSETRON HCL 40 MG/20ML IJ SOLN
Freq: Once | INTRAMUSCULAR | Status: AC
  Administered 2015-03-10: 13:00:00 via INTRAVENOUS
  Filled 2015-03-10: qty 4

## 2015-03-10 MED ORDER — SODIUM CHLORIDE 0.9 % IV SOLN
INTRAVENOUS | Status: AC
  Administered 2015-03-10: 13:00:00 via INTRAVENOUS

## 2015-03-10 MED ORDER — INSULIN REGULAR HUMAN 100 UNIT/ML IJ SOLN
20.0000 [IU] | Freq: Once | INTRAMUSCULAR | Status: AC
  Administered 2015-03-10: 20 [IU] via SUBCUTANEOUS
  Filled 2015-03-10: qty 0.2

## 2015-03-10 NOTE — Assessment & Plan Note (Signed)
C/o increased fatigue and weakness. Most likely, secondary to both chemo and dehydration. Pt received IV fluid rehydration today;and stated that he felt much better.

## 2015-03-10 NOTE — Assessment & Plan Note (Signed)
Pt c/o nausea and 1 episode of vomiting last nite. Took compazine with only minimal effectiveness. Feels dehydrated today. Pt given zofran iv and iv fluid rehydration while at cancer center today. He was also encouraged to ps uh fluids as much as possible. Pt was able to drink and eat a small snack prior to discharge today.

## 2015-03-10 NOTE — Patient Instructions (Signed)
Dehydration, Adult Dehydration means your body does not have as much fluid as it needs. Your kidneys, brain, and heart will not work properly without the right amount of fluids and salt.  HOME CARE  Ask your doctor how to replace body fluid losses (rehydrate).  Drink enough fluids to keep your pee (urine) clear or pale yellow.  Drink small amounts of fluids often if you feel sick to your stomach (nauseous) or throw up (vomit).  Eat like you normally do.  Avoid:  Foods or drinks high in sugar.  Bubbly (carbonated) drinks.  Juice.  Very hot or cold fluids.  Drinks with caffeine.  Fatty, greasy foods.  Alcohol.  Tobacco.  Eating too much.  Gelatin desserts.  Wash your hands to avoid spreading germs (bacteria, viruses).  Only take medicine as told by your doctor.  Keep all doctor visits as told. GET HELP RIGHT AWAY IF:   You cannot drink something without throwing up.  You get worse even with treatment.  Your vomit has blood in it or looks greenish.  Your poop (stool) has blood in it or looks black and tarry.  You have not peed in 6 to 8 hours.  You pee a small amount of very dark pee.  You have a fever.  You pass out (faint).  You have belly (abdominal) pain that gets worse or stays in one spot (localizes).  You have a rash, stiff neck, or bad headache.  You get easily annoyed, sleepy, or are hard to wake up.  You feel weak, dizzy, or very thirsty. MAKE SURE YOU:   Understand these instructions.  Will watch your condition.  Will get help right away if you are not doing well or get worse. Document Released: 09/30/2009 Document Revised: 02/26/2012 Document Reviewed: 07/24/2011 [facility] Patient Information 2015 [place], [place]. This information is not intended to replace advice given to you by your health care provider. Make sure you discuss any questions you have with your health care provider.   Hold Losartan-HCTZ Decrease Metoprolol to 25mg   (1/2 tablet) per day Continue Amlodipine as before Remember to take Compazine for nausea as needed

## 2015-03-10 NOTE — Assessment & Plan Note (Signed)
Pt c/o loss of taste; and very poor appetite secondary to chemo. He feels dehydrated today.  On exam- pt appears fatigued and slightly weak. Pt will receive 1 liter normal saline IV fluids today.   He was also encouraged to push fluids as much as possible. Marland Kitchen

## 2015-03-10 NOTE — Telephone Encounter (Signed)
Patient wife called to say Mr. Rondon was very sick during the night.  She reported he had nausea all day yesterday, vomiting X 2, dizziness and chills.  They have a lab appointment at 10:45 and would like to see someone today.  Will schedule with [facility] for 11:15.  POF done.  He is having a CBC and CMET done.

## 2015-03-10 NOTE — Assessment & Plan Note (Signed)
Pt has a hx of right pleural effusion; and has right pleurex cath intact.  Pt drains catheter every other day. Denies any resp issues today.

## 2015-03-10 NOTE — Assessment & Plan Note (Signed)
Albumin down to 2.4.  Pt encouraged to push protein as much as possible.

## 2015-03-10 NOTE — Assessment & Plan Note (Signed)
Pt c/o loss of taste; and very poor appetite secondary to chemo. Advised pt to eat multiple small meals throughout the day; and push fluids.

## 2015-03-10 NOTE — Progress Notes (Signed)
SYMPTOM MANAGEMENT CLINIC   HPI: Omar Peters 80 y.o. male diagnosed with lung cancer; with bone metastasis.  Currently undergoing carboplatin/Alimta chemotherapy regimen.  Patient got the cancer Center today requesting urgent care visit.  Patient received cycle 3 of his chemotherapy last week on 03/03/2015.  Since that time-he has been suffering with chronic nausea.  He vomited one time last night.  He is also had some chronic constipation as well; with his last bowel movement approximately 3 days ago.  He has been taking some intermittent stool softeners and Leda Gauze X-but not on a regular basis.  He has had poor appetite; and states that he has altered taste sensation now.  He feels fairly dehydrated and weak today.  He denies any recent fevers or chills.  Also, patient has a right chronic pleural effusion; and has a Pleurx catheter intact which he drinks on an every other day basis.  Patient has developed hyperglycemia/diabetes since initiating chemotherapy; and takes metformin extended release 500 mg tablet every evening.  However, last night he vomited right after taking his metformin.  Today's blood sugar was elevated to 434.  Patient states that he has been instructed recently to hold all oral steroids due to issues with hyperglycemia.  Patient also reports that he became dizzy while in the bathroom and fell last night.  He denies hitting his head or any loss of consciousness.  He denies any other injuries from fall.  He states that he has held all of his blood pressure medications to just today.   HPI  ROS  Past Medical History  Diagnosis Date  . Hypertension   . Cancer     Lung  . GERD (gastroesophageal reflux disease)   . Shortness of breath dyspnea     01/14/15 at night and has to sit up  . Anxiety     due to diagnosis of Stage 4 Lung Cancer  . Macular degeneration of right eye     Past Surgical History  Procedure Laterality Date  . Vein ligation and stripping  1977   . Chest tube insertion Right 01/15/2015    Procedure: INSERTION PLEURAL DRAINAGE CATHETER WITH ULTRASOUND AND FLURO GUIDANCE;  Surgeon: Grace Isaac, MD;  Location: [facility];  Service: Thoracic;  Laterality: Right;    has Cavitating mass in right upper lung lobe; Cough; Shortness of breath; Bone metastasis; Cancer of upper lobe of right lung; Hypertension; Hyperglycemia; Anorexia; Dysgeusia; Nausea with vomiting; Constipation; Pleural effusion; Fall; Dehydration; Weakness; and Hypoalbuminemia on his problem list.    has No Known Allergies.    Medication List       This list is accurate as of: 03/10/15  3:51 PM.  Always use your most recent med list.               amLODipine 10 MG tablet  Commonly known as:  NORVASC  Take 10 mg by mouth daily.     aspirin 81 MG tablet  Take 81 mg by mouth daily.     folic acid 1 MG tablet  Commonly known as:  FOLVITE  Take 1 tablet (1 mg total) by mouth daily.     HYDROcodone-acetaminophen 5-325 MG per tablet  Commonly known as:  NORCO  Take 1 tablet by mouth every 6 (six) hours as needed for moderate pain.     losartan-hydrochlorothiazide 100-25 MG per tablet  Commonly known as:  HYZAAR  Take 1 tablet by mouth daily.     metFORMIN 500 MG  24 hr tablet  Commonly known as:  GLUCOPHAGE-XR  Take 500 mg by mouth daily with breakfast.     metoprolol 50 MG tablet  Commonly known as:  LOPRESSOR  Take 25 mg by mouth daily.     prochlorperazine 10 MG tablet  Commonly known as:  COMPAZINE  Take 1 tablet (10 mg total) by mouth every 6 (six) hours as needed for nausea or vomiting.     ranitidine 150 MG tablet  Commonly known as:  ZANTAC  Take 150 mg by mouth daily.         PHYSICAL EXAMINATION  Oncology Vitals 03/03/2015 02/10/2015 02/08/2015 01/27/2015 01/20/2015 01/15/2015 01/15/2015  Height 168 cm - 168 cm 168 cm - - -  Weight 73.71 kg - 71.895 kg 72.031 kg - - -  Weight (lbs) 162 lbs 8 oz - 158 lbs 8 oz 158 lbs 13 oz - - -  BMI (kg/m2)  26.23 kg/m2 - 25.58 kg/m2 25.63 kg/m2 - - -  Temp 98.3 97.9 98.5 98.1 97.7 - -  Pulse 103 100 78 124 77 70 63  Resp 18 20 18 18 18  - 14  SpO2 - 100 97 98 99 93 96  BSA (m2) 1.85 m2 - 1.83 m2 1.83 m2 - - -   BP Readings from Last 3 Encounters:  03/03/15 128/52  02/10/15 146/68  02/08/15 106/47    Physical Exam  Constitutional: He is oriented to person, place, and time. Vital signs are normal. He appears dehydrated. He appears unhealthy.  HENT:  Head: Normocephalic and atraumatic.  Mouth/Throat: Oropharynx is clear and moist.  Eyes: Conjunctivae and EOM are normal. Pupils are equal, round, and reactive to light. Right eye exhibits no discharge. Left eye exhibits no discharge. No scleral icterus.  Neck: Normal range of motion. Neck supple. No JVD present. No tracheal deviation present. No thyromegaly present.  Cardiovascular: Normal rate, regular rhythm, normal heart sounds and intact distal pulses.   Pulmonary/Chest: Effort normal and breath sounds normal. No respiratory distress. He has no wheezes. He has no rales. He exhibits no tenderness.  Right chest Pleurx catheter intact.  Abdominal: Soft. Bowel sounds are normal. He exhibits no distension and no mass. There is no tenderness. There is no rebound and no guarding.  Musculoskeletal: Normal range of motion. He exhibits no edema or tenderness.  Lymphadenopathy:    He has no cervical adenopathy.  Neurological: He is alert and oriented to person, place, and time. Gait normal.  Skin: Skin is warm and dry. No rash noted. No erythema. No pallor.  Psychiatric: Affect normal.  Nursing note and vitals reviewed.   LABORATORY DATA:. Appointment on 03/10/2015  Component Date Value Ref Range Status  . WBC 03/10/2015 4.7  4.0 - 10.3 10e3/uL Final  . NEUT# 03/10/2015 2.8  1.5 - 6.5 10e3/uL Final  . HGB 03/10/2015 12.9* 13.0 - 17.1 g/dL Final  . HCT 03/10/2015 36.8* 38.4 - 49.9 % Final  . Platelets 03/10/2015 130* 140 - 400 10e3/uL Final  .  MCV 03/10/2015 97.6  79.3 - 98.0 fL Final  . MCH 03/10/2015 34.2* 27.2 - 33.4 pg Final  . MCHC 03/10/2015 35.1  32.0 - 36.0 g/dL Final  . RBC 03/10/2015 3.77* 4.20 - 5.82 10e6/uL Final  . RDW 03/10/2015 13.7  11.0 - 14.6 % Final  . lymph# 03/10/2015 0.9  0.9 - 3.3 10e3/uL Final  . MONO# 03/10/2015 0.9  0.1 - 0.9 10e3/uL Final  . Eosinophils Absolute 03/10/2015 0.0  0.0 -  0.5 10e3/uL Final  . Basophils Absolute 03/10/2015 0.0  0.0 - 0.1 10e3/uL Final  . NEUT% 03/10/2015 60.2  39.0 - 75.0 % Final  . LYMPH% 03/10/2015 20.1  14.0 - 49.0 % Final  . MONO% 03/10/2015 19.5* 0.0 - 14.0 % Final  . EOS% 03/10/2015 0.0  0.0 - 7.0 % Final  . BASO% 03/10/2015 0.2  0.0 - 2.0 % Final  . nRBC 03/10/2015 0  0 - 0 % Final  . Sodium 03/10/2015 131* 136 - 145 mEq/L Final  . Potassium 03/10/2015 4.3  3.5 - 5.1 mEq/L Final  . Chloride 03/10/2015 95* 98 - 109 mEq/L Final  . CO2 03/10/2015 27  22 - 29 mEq/L Final  . Glucose 03/10/2015 434* 70 - 140 mg/dl Final  . BUN 03/10/2015 22.1  7.0 - 26.0 mg/dL Final  . Creatinine 03/10/2015 1.0  0.7 - 1.3 mg/dL Final  . Total Bilirubin 03/10/2015 0.86  0.20 - 1.20 mg/dL Final  . Alkaline Phosphatase 03/10/2015 79  40 - 150 U/L Final  . AST 03/10/2015 24  5 - 34 U/L Final  . ALT 03/10/2015 44  0 - 55 U/L Final  . Total Protein 03/10/2015 5.8* 6.4 - 8.3 g/dL Final  . Albumin 03/10/2015 2.4* 3.5 - 5.0 g/dL Final  . Calcium 03/10/2015 9.4  8.4 - 10.4 mg/dL Final  . Anion Gap 03/10/2015 8  3 - 11 mEq/L Final  . EGFR 03/10/2015 73* >90 ml/min/1.73 m2 Final   eGFR is calculated using the CKD-EPI Creatinine Equation (2009)     RADIOGRAPHIC STUDIES: No results found.  ASSESSMENT/PLAN:    Anorexia Pt c/o loss of taste; and very poor appetite secondary to chemo. Advised pt to eat multiple small meals throughout the day; and push fluids.    Cancer of upper lobe of right lung Pt received cycle 3 of his carbo/alimta chemo regimen on 03/03/15.  He is scheduled for a  restaging CT and his next chemo on 03/24/15.    Of note- pt has been advised to hold all further oral dexamethasone due to chronic issues with hyperglycemia.    Constipation C/o chronic constipation.  Has tried occasional stool softeners; and Miralax- but not consistently. On exam- abd soft and nontender. Bowel sounds +.  Advised that pt use Miralax every 4-6 hours until constipation clears.    Dehydration Pt c/o loss of taste; and very poor appetite secondary to chemo. He feels dehydrated today.  On exam- pt appears fatigued and slightly weak. Pt will receive 1 liter normal saline IV fluids today.   He was also encouraged to push fluids as much as possible. .    Dysgeusia Pt c/o loss of taste; and very poor appetite secondary to chemo. Advised pt to eat multiple small meals throughout the day; and push fluids.    Fall Pt reports a fall last nite; but denies hitting his head or LOC. Denies any other injuries from fall.  Pt c/o mild dizziness prior to fall.  Denies any specific dizziness assoc with sudden position changes.  However- pt currently is taking 3 different BP meds; and has been holding all 3 since yesterday.  BP today was 133/68.    Fall could also be related to recent dehydration as well.    Pt instructed to hold Losartan completely, decrease metoprolol to 25 mg QD; and continue amlodipine as before. Cleared this medication plan with pt's PCP as well today. Pt has appt with PCP for follow up on Monday 03/15/15.  Hyperglycemia Pt with new onset hyperglycemia since pt began chemo.  Pt typically takes Metformin ER 500 QD. He vomited right after taking his Metformin last nite. Blood sugar today was 434. Pt was given regular insulin 20 units SQ while in the cancer center.   Recheck of blood sugar prior to discharge was 231.    Hypoalbuminemia Albumin down to 2.4.  Pt encouraged to push protein as much as possible.    Nausea with vomiting Pt c/o nausea and 1 episode of  vomiting last nite. Took compazine with only minimal effectiveness. Feels dehydrated today. Pt given zofran iv and iv fluid rehydration while at cancer center today. He was also encouraged to ps uh fluids as much as possible. Pt was able to drink and eat a small snack prior to discharge today.    Pleural effusion Pt has a hx of right pleural effusion; and has right pleurex cath intact.  Pt drains catheter every other day. Denies any resp issues today.    Weakness C/o increased fatigue and weakness. Most likely, secondary to both chemo and dehydration. Pt received IV fluid rehydration today;and stated that he felt much better.    Patient stated understanding of all instructions; and was in agreement with this plan of care. The patient knows to call the clinic with any problems, questions or concerns.   Review/collaboration with Dr. Julien Nordmann regarding all aspects of patient's visit today.   Total time spent with patient was 40 minutes;  with greater than 75 percent of that time spent in face to face counseling regarding patient's symptoms,  and coordination of care and follow up.  Disclaimer: This note was dictated with voice recognition software. Similar sounding words can inadvertently be transcribed and may not be corrected upon review.   Drue Second, NP 03/10/2015

## 2015-03-10 NOTE — Assessment & Plan Note (Addendum)
Pt reports a fall last nite; but denies hitting his head or LOC. Denies any other injuries from fall.  Pt c/o mild dizziness prior to fall.  Denies any specific dizziness assoc with sudden position changes.  However- pt currently is taking 3 different BP meds; and has been holding all 3 since yesterday.  BP today was 133/68.    Fall could also be related to recent dehydration as well.    Pt instructed to hold Losartan completely, decrease metoprolol to 25 mg QD; and continue amlodipine as before. Cleared this medication plan with pt's PCP as well today. Pt has appt with PCP for follow up on Monday 03/15/15.

## 2015-03-10 NOTE — Assessment & Plan Note (Addendum)
Pt with new onset hyperglycemia since pt began chemo.  Pt typically takes Metformin ER 500 QD. He vomited right after taking his Metformin last nite. Blood sugar today was 434. Pt was given regular insulin 20 units SQ while in the cancer center.   Recheck of blood sugar prior to discharge was 231.

## 2015-03-10 NOTE — Assessment & Plan Note (Signed)
Pt received cycle 3 of his carbo/alimta chemo regimen on 03/03/15.  He is scheduled for a restaging CT and his next chemo on 03/24/15.    Of note- pt has been advised to hold all further oral dexamethasone due to chronic issues with hyperglycemia.

## 2015-03-10 NOTE — Assessment & Plan Note (Signed)
C/o chronic constipation.  Has tried occasional stool softeners; and Miralax- but not consistently. On exam- abd soft and nontender. Bowel sounds +.  Advised that pt use Miralax every 4-6 hours until constipation clears.

## 2015-03-12 ENCOUNTER — Telehealth: Payer: Self-pay | Admitting: *Deleted

## 2015-03-12 NOTE — Telephone Encounter (Signed)
Called to follow up with patient.  Patient was seen by symptom management of this week and I wanted to see if there was anything I could do to help.  I spoke with wife due to patient taking a nap. I listened as she explained what was going on with patient.  She stated he is doing much better today than he was on Wednesday.  She was thankful he seemed to be doing better and that home health is coming to see patient today and check on him.  She stated there is nothing I could do to help at this time.  I asked that she call the cancer center if she needed.

## 2015-03-17 ENCOUNTER — Other Ambulatory Visit (HOSPITAL_BASED_OUTPATIENT_CLINIC_OR_DEPARTMENT_OTHER): Payer: Medicare Other

## 2015-03-17 DIAGNOSIS — C3411 Malignant neoplasm of upper lobe, right bronchus or lung: Secondary | ICD-10-CM | POA: Diagnosis not present

## 2015-03-17 LAB — CBC WITH DIFFERENTIAL/PLATELET
BASO%: 1.2 % (ref 0.0–2.0)
BASOS ABS: 0.1 10*3/uL (ref 0.0–0.1)
EOS ABS: 0 10*3/uL (ref 0.0–0.5)
EOS%: 0.3 % (ref 0.0–7.0)
HCT: 30.3 % — ABNORMAL LOW (ref 38.4–49.9)
HGB: 10.3 g/dL — ABNORMAL LOW (ref 13.0–17.1)
LYMPH%: 18.9 % (ref 14.0–49.0)
MCH: 34.4 pg — ABNORMAL HIGH (ref 27.2–33.4)
MCHC: 33.8 g/dL (ref 32.0–36.0)
MCV: 101.6 fL — ABNORMAL HIGH (ref 79.3–98.0)
MONO#: 1.3 10*3/uL — AB (ref 0.1–0.9)
MONO%: 28.1 % — ABNORMAL HIGH (ref 0.0–14.0)
NEUT#: 2.3 10*3/uL (ref 1.5–6.5)
NEUT%: 51.5 % (ref 39.0–75.0)
Platelets: 229 10*3/uL (ref 140–400)
RBC: 2.98 10*6/uL — ABNORMAL LOW (ref 4.20–5.82)
RDW: 14.2 % (ref 11.0–14.6)
WBC: 4.5 10*3/uL (ref 4.0–10.3)
lymph#: 0.8 10*3/uL — ABNORMAL LOW (ref 0.9–3.3)

## 2015-03-17 LAB — COMPREHENSIVE METABOLIC PANEL (CC13)
ALT: 26 U/L (ref 0–55)
AST: 21 U/L (ref 5–34)
Albumin: 1.8 g/dL — ABNORMAL LOW (ref 3.5–5.0)
Alkaline Phosphatase: 105 U/L (ref 40–150)
Anion Gap: 12 mEq/L — ABNORMAL HIGH (ref 3–11)
BUN: 13 mg/dL (ref 7.0–26.0)
CO2: 26 meq/L (ref 22–29)
Calcium: 8.4 mg/dL (ref 8.4–10.4)
Chloride: 98 mEq/L (ref 98–109)
Creatinine: 0.7 mg/dL (ref 0.7–1.3)
GLUCOSE: 168 mg/dL — AB (ref 70–140)
POTASSIUM: 3.6 meq/L (ref 3.5–5.1)
Sodium: 136 mEq/L (ref 136–145)
Total Bilirubin: 0.77 mg/dL (ref 0.20–1.20)
Total Protein: 5.9 g/dL — ABNORMAL LOW (ref 6.4–8.3)

## 2015-03-18 ENCOUNTER — Telehealth: Payer: Self-pay | Admitting: *Deleted

## 2015-03-18 NOTE — Telephone Encounter (Signed)
Patient's wife called to say he was given two bottle contrast but instructions on how to take it.  Advised her of NPO x 4 hour prior, one bottle 2 hours before and one bottle 1 hour before exam.

## 2015-03-24 ENCOUNTER — Ambulatory Visit (HOSPITAL_BASED_OUTPATIENT_CLINIC_OR_DEPARTMENT_OTHER): Payer: Medicare Other | Admitting: Physician Assistant

## 2015-03-24 ENCOUNTER — Encounter: Payer: Self-pay | Admitting: Physician Assistant

## 2015-03-24 ENCOUNTER — Ambulatory Visit (HOSPITAL_COMMUNITY)
Admission: RE | Admit: 2015-03-24 | Discharge: 2015-03-24 | Disposition: A | Discharge status: Home / Self Care | Payer: Medicare Other | Source: Ambulatory Visit | Attending: Nurse Practitioner | Admitting: Nurse Practitioner

## 2015-03-24 ENCOUNTER — Other Ambulatory Visit (HOSPITAL_BASED_OUTPATIENT_CLINIC_OR_DEPARTMENT_OTHER): Payer: Medicare Other

## 2015-03-24 ENCOUNTER — Telehealth: Payer: Self-pay | Admitting: Internal Medicine

## 2015-03-24 ENCOUNTER — Encounter (HOSPITAL_COMMUNITY): Payer: Self-pay

## 2015-03-24 ENCOUNTER — Ambulatory Visit (HOSPITAL_BASED_OUTPATIENT_CLINIC_OR_DEPARTMENT_OTHER): Payer: Medicare Other

## 2015-03-24 VITALS — BP 132/66 | HR 129 | Temp 97.6°F | Resp 18 | Ht 66.0 in | Wt 155.1 lb

## 2015-03-24 DIAGNOSIS — C7951 Secondary malignant neoplasm of bone: Secondary | ICD-10-CM | POA: Diagnosis not present

## 2015-03-24 DIAGNOSIS — J439 Emphysema, unspecified: Secondary | ICD-10-CM | POA: Insufficient documentation

## 2015-03-24 DIAGNOSIS — R0602 Shortness of breath: Secondary | ICD-10-CM

## 2015-03-24 DIAGNOSIS — E041 Nontoxic single thyroid nodule: Secondary | ICD-10-CM | POA: Diagnosis not present

## 2015-03-24 DIAGNOSIS — J90 Pleural effusion, not elsewhere classified: Secondary | ICD-10-CM

## 2015-03-24 DIAGNOSIS — C3411 Malignant neoplasm of upper lobe, right bronchus or lung: Secondary | ICD-10-CM | POA: Diagnosis not present

## 2015-03-24 DIAGNOSIS — Z5111 Encounter for antineoplastic chemotherapy: Secondary | ICD-10-CM

## 2015-03-24 DIAGNOSIS — R630 Anorexia: Secondary | ICD-10-CM

## 2015-03-24 LAB — COMPREHENSIVE METABOLIC PANEL (CC13)
ALT: 24 U/L (ref 0–55)
AST: 23 U/L (ref 5–34)
Albumin: 2.2 g/dL — ABNORMAL LOW (ref 3.5–5.0)
Alkaline Phosphatase: 110 U/L (ref 40–150)
Anion Gap: 11 mEq/L (ref 3–11)
BUN: 9 mg/dL (ref 7.0–26.0)
CO2: 26 mEq/L (ref 22–29)
Calcium: 8.7 mg/dL (ref 8.4–10.4)
Chloride: 98 mEq/L (ref 98–109)
Creatinine: 0.7 mg/dL (ref 0.7–1.3)
EGFR: >90 mL/min/{1.73_m2} (ref >90)
Glucose: 128 mg/dl (ref 70–140)
Potassium: 3.2 mEq/L — ABNORMAL LOW (ref 3.5–5.1)
Sodium: 136 mEq/L (ref 136–145)
Total Bilirubin: 0.49 mg/dL (ref 0.20–1.20)
Total Protein: 7 g/dL (ref 6.4–8.3)

## 2015-03-24 LAB — CBC WITH DIFFERENTIAL/PLATELET
BASO%: 1 % (ref 0.0–2.0)
Basophils Absolute: 0.1 10*3/uL (ref 0.0–0.1)
EOS%: 0.5 % (ref 0.0–7.0)
Eosinophils Absolute: 0 10*3/uL (ref 0.0–0.5)
HCT: 34.8 % — ABNORMAL LOW (ref 38.4–49.9)
HGB: 11.6 g/dL — ABNORMAL LOW (ref 13.0–17.1)
LYMPH#: 1.7 10*3/uL (ref 0.9–3.3)
LYMPH%: 19 % (ref 14.0–49.0)
MCH: 33.8 pg — ABNORMAL HIGH (ref 27.2–33.4)
MCHC: 33.3 g/dL (ref 32.0–36.0)
MCV: 101.6 fL — ABNORMAL HIGH (ref 79.3–98.0)
MONO#: 1.6 10*3/uL — AB (ref 0.1–0.9)
MONO%: 17.3 % — AB (ref 0.0–14.0)
NEUT#: 5.7 10*3/uL (ref 1.5–6.5)
NEUT%: 62.2 % (ref 39.0–75.0)
Platelets: 467 10*3/uL — ABNORMAL HIGH (ref 140–400)
RBC: 3.42 10*6/uL — AB (ref 4.20–5.82)
RDW: 14.9 % — ABNORMAL HIGH (ref 11.0–14.6)
WBC: 9.2 10*3/uL (ref 4.0–10.3)

## 2015-03-24 MED ORDER — SODIUM CHLORIDE 0.9 % IV SOLN
480.0000 mg/m2 | Freq: Once | INTRAVENOUS | Status: AC
  Administered 2015-03-24: 900 mg via INTRAVENOUS
  Filled 2015-03-24: qty 36

## 2015-03-24 MED ORDER — SODIUM CHLORIDE 0.9 % IV SOLN
Freq: Once | INTRAVENOUS | Status: AC
  Administered 2015-03-24: 13:00:00 via INTRAVENOUS

## 2015-03-24 MED ORDER — CARBOPLATIN CHEMO INJECTION 600 MG/60ML
456.0000 mg | Freq: Once | INTRAVENOUS | Status: AC
  Administered 2015-03-24: 460 mg via INTRAVENOUS
  Filled 2015-03-24: qty 46

## 2015-03-24 MED ORDER — SODIUM CHLORIDE 0.9 % IV SOLN
Freq: Once | INTRAVENOUS | Status: AC
  Administered 2015-03-24: 13:00:00 via INTRAVENOUS
  Filled 2015-03-24: qty 8

## 2015-03-24 MED ORDER — IOHEXOL 300 MG/ML  SOLN
100.0000 mL | Freq: Once | INTRAMUSCULAR | Status: AC | PRN
  Administered 2015-03-24: 100 mL via INTRAVENOUS

## 2015-03-24 NOTE — Progress Notes (Signed)
Quick Note:  Call patient with the result and encourage potassium-rich diet ______

## 2015-03-24 NOTE — Progress Notes (Addendum)
Omar Peters [phone]   Fax:([phone]) [place], [place] [address] [place] [address]  DIAGNOSIS: Stage IV (T3, N3, M1b) non-small cell lung cancer, adenocarcinoma diagnosed in January 2016 presented with right upper lobe lung mass in addition to mediastinal lymphadenopathy and large right pleural effusion as well as bone metastasis.  PRIOR THERAPY: Status post Pleurx catheter placement by Dr. Servando Snare for drainage of the right pleural effusion  CURRENT THERAPY: Systemic chemotherapy with carboplatin for AUC of 5 and Alimta 500 MG/M2 every 3 weeks. Status post 3 cycles.  INTERVAL HISTORY: Omar Peters 80 y.o. male returns to the clinic today for follow-up visit accompanied by his wife. He is due for cycle 4 of treatment today. Overall he is tolerating treatments well. His appetite remains decreased and he has lost a few more pounds. He is drinking 1 - 2 Boost supplements daily.  His constipation has improved with miralax and stool softeners. He denies fevers, chills, nausea, or vomiting. He has some shortness of breath with exertion but denies cough or hemoptysis. He has no pain. He continues to have his pleurex drained every other day. He continues to have less than 500 ccs each time. He recently had a restaging CT scan of his chest, abdomen and pelvis and presents to discuss the results.   MEDICAL HISTORY: Past Medical History  Diagnosis Date  . Hypertension   . Cancer     Lung  . GERD (gastroesophageal reflux disease)   . Shortness of breath dyspnea     01/14/15 at night and has to sit up  . Anxiety     due to diagnosis of Stage 4 Lung Cancer  . Macular degeneration of right eye     ALLERGIES:  has No Known Allergies.  MEDICATIONS:  Current Outpatient Prescriptions  Medication Sig Dispense Refill  . amLODipine (NORVASC) 10 MG tablet Take 10 mg by mouth daily.    Marland Kitchen aspirin 81 MG tablet Take 81 mg by mouth  daily.    . folic acid (FOLVITE) 1 MG tablet Take 1 tablet (1 mg total) by mouth daily. 30 tablet 3  . HYDROcodone-acetaminophen (NORCO) 5-325 MG per tablet Take 1 tablet by mouth every 6 (six) hours as needed for moderate pain. 30 tablet 0  . losartan-hydrochlorothiazide (HYZAAR) 100-25 MG per tablet Take 1 tablet by mouth daily.    . metFORMIN (GLUCOPHAGE-XR) 500 MG 24 hr tablet Take 500 mg by mouth daily with breakfast.    . metoprolol (LOPRESSOR) 50 MG tablet Take 25 mg by mouth daily.     . prochlorperazine (COMPAZINE) 10 MG tablet Take 1 tablet (10 mg total) by mouth every 6 (six) hours as needed for nausea or vomiting. 30 tablet 0  . ranitidine (ZANTAC) 150 MG tablet Take 150 mg by mouth daily.     No current facility-administered medications for this visit.    SURGICAL HISTORY:  Past Surgical History  Procedure Laterality Date  . Vein ligation and stripping  1977  . Chest tube insertion Right 01/15/2015    Procedure: INSERTION PLEURAL DRAINAGE CATHETER WITH ULTRASOUND AND FLURO GUIDANCE;  Surgeon: Grace Isaac, MD;  Location: [facility];  Service: Thoracic;  Laterality: Right;    REVIEW OF SYSTEMS:  A comprehensive review of systems was negative except for: Respiratory: positive for dyspnea on exertion Gastrointestinal: positive for constipation and lack of appetite.    PHYSICAL EXAMINATION: General appearance: alert,  cooperative and no distress Head: Normocephalic, without obvious abnormality, atraumatic Neck: no adenopathy, no JVD, supple, symmetrical, trachea midline and thyroid not enlarged, symmetric, no tenderness/mass/nodules Lymph nodes: Cervical, supraclavicular, and axillary nodes normal. Resp: clear to auscultation bilaterally Back: symmetric, no curvature. ROM normal. No CVA tenderness. Cardio: regular rate and rhythm, S1, S2 normal, no murmur, click, rub or gallop GI: soft, non-tender; bowel sounds normal; no masses,  no organomegaly Extremities: extremities normal,  atraumatic, no cyanosis or edema  ECOG PERFORMANCE STATUS: 1 - Symptomatic but completely ambulatory  Blood pressure 132/66, pulse 129, temperature 97.6 F (36.4 C), temperature source Oral, resp. rate 18, height 5\' 6"  (1.676 m), weight 155 lb 1.6 oz (70.353 kg), SpO2 100 %.  LABORATORY DATA: Lab Results  Component Value Date   WBC 9.2 03/24/2015   HGB 11.6* 03/24/2015   HCT 34.8* 03/24/2015   MCV 101.6* 03/24/2015   PLT 467* 03/24/2015      Chemistry      Component Value Date/Time   NA 136 03/24/2015 1207   NA 138 01/15/2015 0608   K 3.2* 03/24/2015 1207   K 3.5 01/15/2015 0608   CL 97 01/15/2015 0608   CO2 26 03/24/2015 1207   CO2 34* 01/15/2015 0608   BUN 9.0 03/24/2015 1207   BUN 13 01/15/2015 0608   CREATININE 0.7 03/24/2015 1207   CREATININE 0.96 01/15/2015 0608   GLU 231* 03/10/2015 1539      Component Value Date/Time   CALCIUM 8.7 03/24/2015 1207   CALCIUM 9.4 01/15/2015 0608   ALKPHOS 110 03/24/2015 1207   ALKPHOS 102 01/15/2015 0608   AST 23 03/24/2015 1207   AST 42* 01/15/2015 0608   ALT 24 03/24/2015 1207   ALT 38 01/15/2015 0608   BILITOT 0.49 03/24/2015 1207   BILITOT 0.8 01/15/2015 0608       RADIOGRAPHIC STUDIES: Ct Chest W Contrast  03/24/2015   CLINICAL DATA:  Restaging lung cancer diagnosed in January 2016. Undergoing chemotherapy treatment.  EXAM: CT CHEST, ABDOMEN, AND PELVIS WITH CONTRAST  TECHNIQUE: Multidetector CT imaging of the chest, abdomen and pelvis was performed following the standard protocol during bolus administration of intravenous contrast.  CONTRAST:  145mL OMNIPAQUE IOHEXOL 300 MG/ML  SOLN  COMPARISON:  Chest CT 12/16/2014 and PET-CT 12/29/2014  FINDINGS: CT CHEST FINDINGS  Chest wall: No chest wall mass, supraclavicular or axillary adenopathy. There are few small stable supraclavicular lymph nodes bilaterally. A small stable left thyroid nodule is noted. The bony thorax is intact. Stable right scapular lesion. There is a small  sclerotic lesion involving T11 which I do not identify on the prior study and could represent a new metastasis.  Mediastinum: The heart is normal in size. No pericardial effusion. The aorta is normal in caliber. Stable atherosclerotic calcifications. Stable coronary artery calcifications. The mediastinal lymph nodes are slightly smaller.  The right paratracheal node on image 13 measures 9 mm and previously measured 11 mm.  Precarinal lymph node on image number 23 measures 13 mm and previously measured 16 mm.  Aorticopulmonary window node on image number 24 measures 10 mm and previously measured 11 mm.  Subcarinal lymph node on image number 28 measures 7 mm and previously measured 10 mm.  Lungs/ pleura: The right upper lung mass measures approximately 7 x 5 cm and previously measured 10 x 6 cm. It is difficult to assess tumor versus drowned/obstructed lung. There is a rim enhancing pleural fluid collection around the mass at the lung apex and a  right basilar pleural effusion with overlying atelectasis and a PleurX drainage catheter in place. Overall early pleural effusion is much smaller. There is moderate interstitial thickening in the lungs and around the mass. Could not exclude interstitial spread of tumor.  No new pulmonary nodules to suggest metastatic disease.  Stable emphysematous changes in the left lung. No left lung nodules.  CT ABDOMEN AND PELVIS FINDINGS  Hepatobiliary: Suspect cirrhotic changes involving the liver with an irregular contour and heterogeneous attenuation. No focal hepatic lesions or intrahepatic biliary dilatation. The gallbladder wall all demonstrates mild enhancement and possible mild wall thickening. This could be due to the cirrhosis. The common bile duct is within normal limits for age.  Pancreas:  Normal.  Spleen:  Normal.  Adrenal glands/kidneys:  No significant findings.  Stomach and bowel: The stomach, duodenum, small bowel and colon are unremarkable. No inflammatory changes, mass  lesions or obstructive findings. The terminal ileum is normal. The appendix is normal.  Vascular/lymphatic: Stable advanced atherosclerotic changes involving the aorta and branch vessel ostia. No focal aneurysm or dissection. Small scattered mesenteric and retroperitoneal lymph nodes but no mass or adenopathy.  Pelvis: The bladder, prostate gland and seminal vesicles are unremarkable. No pelvic mass or adenopathy. No free pelvic fluid collections. No inguinal mass or adenopathy.  Musculoskeletal: Stable large lytic lesion involving the right iliac bone which was previously biopsy. New small rounded sclerotic lesion near the S1 nerve foramen is likely an new metastasis.  IMPRESSION: 1. Slight interval decrease in size of the right upper lobe lung mass although it is difficult to tell tumor from drowned/obstructed lung. 2. PleurX drainage catheter in place on the right side with decrease in right-sided pleural effusion. 3. Slightly smaller mediastinal lymph nodes as detailed above. 4. Stable bone lesions involving the right scapula and right iliac crest. Suspect new small sclerotic metastasis involving T11 and S1. 5. No findings for metastatic disease involving the abdomen/pelvis.   Electronically Signed   By: Marijo Sanes M.D.   On: 03/24/2015 14:16   Ct Abdomen Pelvis W Contrast  03/24/2015   CLINICAL DATA:  Restaging lung cancer diagnosed in January 2016. Undergoing chemotherapy treatment.  EXAM: CT CHEST, ABDOMEN, AND PELVIS WITH CONTRAST  TECHNIQUE: Multidetector CT imaging of the chest, abdomen and pelvis was performed following the standard protocol during bolus administration of intravenous contrast.  CONTRAST:  173mL OMNIPAQUE IOHEXOL 300 MG/ML  SOLN  COMPARISON:  Chest CT 12/16/2014 and PET-CT 12/29/2014  FINDINGS: CT CHEST FINDINGS  Chest wall: No chest wall mass, supraclavicular or axillary adenopathy. There are few small stable supraclavicular lymph nodes bilaterally. A small stable left thyroid nodule  is noted. The bony thorax is intact. Stable right scapular lesion. There is a small sclerotic lesion involving T11 which I do not identify on the prior study and could represent a new metastasis.  Mediastinum: The heart is normal in size. No pericardial effusion. The aorta is normal in caliber. Stable atherosclerotic calcifications. Stable coronary artery calcifications. The mediastinal lymph nodes are slightly smaller.  The right paratracheal node on image 13 measures 9 mm and previously measured 11 mm.  Precarinal lymph node on image number 23 measures 13 mm and previously measured 16 mm.  Aorticopulmonary window node on image number 24 measures 10 mm and previously measured 11 mm.  Subcarinal lymph node on image number 28 measures 7 mm and previously measured 10 mm.  Lungs/ pleura: The right upper lung mass measures approximately 7 x 5 cm and previously measured  10 x 6 cm. It is difficult to assess tumor versus drowned/obstructed lung. There is a rim enhancing pleural fluid collection around the mass at the lung apex and a right basilar pleural effusion with overlying atelectasis and a PleurX drainage catheter in place. Overall early pleural effusion is much smaller. There is moderate interstitial thickening in the lungs and around the mass. Could not exclude interstitial spread of tumor.  No new pulmonary nodules to suggest metastatic disease.  Stable emphysematous changes in the left lung. No left lung nodules.  CT ABDOMEN AND PELVIS FINDINGS  Hepatobiliary: Suspect cirrhotic changes involving the liver with an irregular contour and heterogeneous attenuation. No focal hepatic lesions or intrahepatic biliary dilatation. The gallbladder wall all demonstrates mild enhancement and possible mild wall thickening. This could be due to the cirrhosis. The common bile duct is within normal limits for age.  Pancreas:  Normal.  Spleen:  Normal.  Adrenal glands/kidneys:  No significant findings.  Stomach and bowel: The  stomach, duodenum, small bowel and colon are unremarkable. No inflammatory changes, mass lesions or obstructive findings. The terminal ileum is normal. The appendix is normal.  Vascular/lymphatic: Stable advanced atherosclerotic changes involving the aorta and branch vessel ostia. No focal aneurysm or dissection. Small scattered mesenteric and retroperitoneal lymph nodes but no mass or adenopathy.  Pelvis: The bladder, prostate gland and seminal vesicles are unremarkable. No pelvic mass or adenopathy. No free pelvic fluid collections. No inguinal mass or adenopathy.  Musculoskeletal: Stable large lytic lesion involving the right iliac bone which was previously biopsy. New small rounded sclerotic lesion near the S1 nerve foramen is likely an new metastasis.  IMPRESSION: 1. Slight interval decrease in size of the right upper lobe lung mass although it is difficult to tell tumor from drowned/obstructed lung. 2. PleurX drainage catheter in place on the right side with decrease in right-sided pleural effusion. 3. Slightly smaller mediastinal lymph nodes as detailed above. 4. Stable bone lesions involving the right scapula and right iliac crest. Suspect new small sclerotic metastasis involving T11 and S1. 5. No findings for metastatic disease involving the abdomen/pelvis.   Electronically Signed   By: Marijo Sanes M.D.   On: 03/24/2015 14:16    ASSESSMENT AND PLAN: This is a very pleasant 80 years old white male recently diagnosed with a stage IV non-small cell lung cancer, adenocarcinoma and currently undergoing systemic chemotherapy with carboplatin and Alimta status post 3 cycles.  Overall Omar Peters is tolerating his chemotherapy relatively well . He is encouraged to increase his by mouth intake of food and fluids. The patient was discussed with and also seen by Dr. Julien Nordmann. His recent restaging CT scan showed improvement in his disease. He will proceed with cycle #4 today as scheduled. He will continue with  weekly labs as scheduled. He will follow up in 3 weeks prior to the start of cycle #5.  He understands and agrees with this plan. He has been encouraged to call with any issues that might arise before his next visit here.   Carlton Adam, PA-C 03/24/2015   ADDENDUM: Hematology/Oncology Attending: I had a face to face encounter with the patient. I recommended his care plan. This is a very pleasant 80 years old white male with a stage IV non-small cell lung cancer, status adenocarcinoma and currently undergoing systemic chemotherapy was carboplatin and Alimta status post 3 cycles. He is tolerating his treatment fairly well except for lack of appetite and he lost a few more pounds. The patient  continues to have drainage from the right Pleurx catheter and around 500 ML few times a week. His recent CT scan of the chest, abdomen and pelvis showed improvement in his disease. I discussed the scan results with the patient and his wife. I recommended for him to continue his current systemic chemotherapy with carbo platinum and Alimta as a scheduled. The patient will proceed with cycle #4 today. He would come back for follow-up visit in 3 weeks with the next cycle of his treatment. He was advised to call immediately if he has any concerning symptoms in the interval.  Disclaimer: This note was dictated with voice recognition software. Similar sounding words can inadvertently be transcribed and may be missed upon review. Eilleen Kempf., MD 03/29/2015

## 2015-03-24 NOTE — Patient Instructions (Signed)
[facility] Discharge Instructions for Patients Receiving Chemotherapy  Today you received the following chemotherapy agents: Alimta, Carboplatin  To help prevent nausea and vomiting after your treatment, we encourage you to take your nausea medication as prescribed by your physician.   If you develop nausea and vomiting that is not controlled by your nausea medication, call the clinic.   BELOW ARE SYMPTOMS THAT SHOULD BE REPORTED IMMEDIATELY:  *FEVER GREATER THAN 100.5 F  *CHILLS WITH OR WITHOUT FEVER  NAUSEA AND VOMITING THAT IS NOT CONTROLLED WITH YOUR NAUSEA MEDICATION  *UNUSUAL SHORTNESS OF BREATH  *UNUSUAL BRUISING OR BLEEDING  TENDERNESS IN MOUTH AND THROAT WITH OR WITHOUT PRESENCE OF ULCERS  *URINARY PROBLEMS  *BOWEL PROBLEMS  UNUSUAL RASH Items with * indicate a potential emergency and should be followed up as soon as possible.  Feel free to call the clinic you have any questions or concerns. The clinic phone number is [phone].  Please show the [facility] at check-in to the Emergency Department and triage nurse.

## 2015-03-24 NOTE — Telephone Encounter (Signed)
gave and printed appt sched and avs fo rpt for April and May....sed added tx.

## 2015-03-25 ENCOUNTER — Other Ambulatory Visit: Payer: Self-pay | Admitting: *Deleted

## 2015-03-25 ENCOUNTER — Telehealth: Payer: Self-pay | Admitting: *Deleted

## 2015-03-25 DIAGNOSIS — C3411 Malignant neoplasm of upper lobe, right bronchus or lung: Secondary | ICD-10-CM

## 2015-03-25 DIAGNOSIS — J90 Pleural effusion, not elsewhere classified: Secondary | ICD-10-CM

## 2015-03-25 DIAGNOSIS — C7951 Secondary malignant neoplasm of bone: Secondary | ICD-10-CM

## 2015-03-25 DIAGNOSIS — J984 Other disorders of lung: Secondary | ICD-10-CM

## 2015-03-25 DIAGNOSIS — R630 Anorexia: Secondary | ICD-10-CM

## 2015-03-25 MED ORDER — FOLIC ACID 1 MG PO TABS: 1.0000 mg | ORAL_TABLET | Freq: Every day | ORAL | Status: DC

## 2015-03-25 NOTE — Telephone Encounter (Signed)
Per MD notified pt Potassium 3.2 encouraged potassium rich diet. Gave pt some exampled of such foods. No further concerns.

## 2015-03-25 NOTE — Telephone Encounter (Signed)
-----   Message from Curt Bears, MD sent at 03/24/2015  7:00 PM EDT ----- Call patient with the result and encourage potassium-rich diet

## 2015-03-26 ENCOUNTER — Encounter (HOSPITAL_COMMUNITY)
Admission: RE | Disposition: A | Discharge status: Home / Self Care | Payer: Self-pay | Source: Ambulatory Visit | Attending: Cardiothoracic Surgery

## 2015-03-26 ENCOUNTER — Ambulatory Visit (HOSPITAL_COMMUNITY)
Admission: RE | Admit: 2015-03-26 | Discharge: 2015-03-26 | Disposition: A | Discharge status: Home / Self Care | Payer: Medicare Other | Source: Ambulatory Visit | Attending: Cardiothoracic Surgery | Admitting: Cardiothoracic Surgery

## 2015-03-26 DIAGNOSIS — Z85118 Personal history of other malignant neoplasm of bronchus and lung: Secondary | ICD-10-CM | POA: Insufficient documentation

## 2015-03-26 DIAGNOSIS — C7951 Secondary malignant neoplasm of bone: Secondary | ICD-10-CM | POA: Diagnosis not present

## 2015-03-26 DIAGNOSIS — Z4589 Encounter for adjustment and management of other implanted devices: Secondary | ICD-10-CM | POA: Insufficient documentation

## 2015-03-26 DIAGNOSIS — J90 Pleural effusion, not elsewhere classified: Secondary | ICD-10-CM

## 2015-03-26 HISTORY — PX: REMOVAL OF PLEURAL DRAINAGE CATHETER: SHX5080

## 2015-03-26 SURGERY — REMOVAL, CLOSED DRAINAGE CATHETER SYSTEM, PLEURAL
Anesthesia: Monitor Anesthesia Care | Laterality: Right

## 2015-03-26 MED ORDER — LIDOCAINE HCL (PF) 1 % IJ SOLN
INTRAMUSCULAR | Status: AC
  Filled 2015-03-26: qty 5

## 2015-03-26 NOTE — Progress Notes (Signed)
Patient presents today for right Pleur X removal. According to the patient, there has been no drainage from it since Easter. The right Pleur X catheter and surrounding area was prepped and draped in a sterile fashion using Betadine. 1 % xylocaine was used to achieve local anesthetic. Suture was removed. Pleur X cuff was easy to located. It was carefully dissected out. Right Pleur X catheter was removed. There was a strand of tissue and it was clogged. 2 Nylon sutures were placed with 4x4's and tape. Patient tolerated procedure well.

## 2015-03-26 NOTE — Discharge Instructions (Signed)
Pt. & wife handed written instructions with f/u appt. Instructions

## 2015-03-28 NOTE — Patient Instructions (Signed)
Your recent CT scan showed some improvement in your disease Continue weekly labs as scheduled Follow up in 3 weeks

## 2015-03-30 ENCOUNTER — Encounter (HOSPITAL_COMMUNITY): Payer: Self-pay | Admitting: Cardiothoracic Surgery

## 2015-03-31 ENCOUNTER — Other Ambulatory Visit (HOSPITAL_BASED_OUTPATIENT_CLINIC_OR_DEPARTMENT_OTHER): Payer: Medicare Other

## 2015-03-31 DIAGNOSIS — C3411 Malignant neoplasm of upper lobe, right bronchus or lung: Secondary | ICD-10-CM | POA: Diagnosis not present

## 2015-03-31 DIAGNOSIS — C7951 Secondary malignant neoplasm of bone: Secondary | ICD-10-CM

## 2015-03-31 LAB — CBC WITH DIFFERENTIAL/PLATELET
BASO%: 1 % (ref 0.0–2.0)
Basophils Absolute: 0 10*3/uL (ref 0.0–0.1)
EOS%: 0.3 % (ref 0.0–7.0)
Eosinophils Absolute: 0 10*3/uL (ref 0.0–0.5)
HEMATOCRIT: 31.5 % — AB (ref 38.4–49.9)
HGB: 10.5 g/dL — ABNORMAL LOW (ref 13.0–17.1)
LYMPH%: 32.1 % (ref 14.0–49.0)
MCH: 33.7 pg — AB (ref 27.2–33.4)
MCHC: 33.3 g/dL (ref 32.0–36.0)
MCV: 101.2 fL — ABNORMAL HIGH (ref 79.3–98.0)
MONO#: 0.6 10*3/uL (ref 0.1–0.9)
MONO%: 14.9 % — AB (ref 0.0–14.0)
NEUT#: 2.1 10*3/uL (ref 1.5–6.5)
NEUT%: 51.7 % (ref 39.0–75.0)
PLATELETS: 260 10*3/uL (ref 140–400)
RBC: 3.11 10*6/uL — ABNORMAL LOW (ref 4.20–5.82)
RDW: 15 % — ABNORMAL HIGH (ref 11.0–14.6)
WBC: 4 10*3/uL (ref 4.0–10.3)
lymph#: 1.3 10*3/uL (ref 0.9–3.3)

## 2015-03-31 LAB — COMPREHENSIVE METABOLIC PANEL (CC13)
ALK PHOS: 98 U/L (ref 40–150)
ALT: 29 U/L (ref 0–55)
ANION GAP: 10 meq/L (ref 3–11)
AST: 19 U/L (ref 5–34)
Albumin: 2.1 g/dL — ABNORMAL LOW (ref 3.5–5.0)
BUN: 15.3 mg/dL (ref 7.0–26.0)
CALCIUM: 8.7 mg/dL (ref 8.4–10.4)
CHLORIDE: 99 meq/L (ref 98–109)
CO2: 29 meq/L (ref 22–29)
Creatinine: 0.8 mg/dL (ref 0.7–1.3)
EGFR: 88 mL/min/{1.73_m2} — ABNORMAL LOW (ref >90)
Glucose: 258 mg/dl — ABNORMAL HIGH (ref 70–140)
POTASSIUM: 3.6 meq/L (ref 3.5–5.1)
SODIUM: 139 meq/L (ref 136–145)
TOTAL PROTEIN: 6.5 g/dL (ref 6.4–8.3)
Total Bilirubin: 0.47 mg/dL (ref 0.20–1.20)

## 2015-04-02 ENCOUNTER — Encounter: Payer: Self-pay | Admitting: Internal Medicine

## 2015-04-02 NOTE — Progress Notes (Signed)
I placed the physician form on the desk of nurse for dr. Julien Nordmann

## 2015-04-05 ENCOUNTER — Telehealth: Payer: Self-pay | Admitting: *Deleted

## 2015-04-05 ENCOUNTER — Telehealth: Payer: Self-pay | Admitting: Internal Medicine

## 2015-04-05 NOTE — Telephone Encounter (Signed)
TC from pt's wife Joy. She states her husband is feeling increasingly weak, fatigued to the point where he does not want to get up. Oral intake has decrerased. Also c/o some constipation for which he is taking sennakot and miralax. He had not had a bowel movement in 5 days until this am. He had a medium soft stool this am.  Wife thinks he may be dehydrated. Last chemo (Alimta and carboplatin) was on 03/24/15. Wife states he has lab appt. On Wednesday (04/07/15) and would like him to be seen in clinic that day. Offered earlier visit but wife and pt prefer to wait until Wednesday.  Will schedule appt. With Selena Lesser NP for 04/07/15 after labs. Instructed Mrs. Klemmer to push fluids with her husband and to call back sooner if he feels worse in the next day or two.  Mrs. Norgaard voiced understanding.

## 2015-04-05 NOTE — Telephone Encounter (Signed)
pt wife called she thinks her husband is dehydrated....transferred to triage

## 2015-04-07 ENCOUNTER — Ambulatory Visit (HOSPITAL_BASED_OUTPATIENT_CLINIC_OR_DEPARTMENT_OTHER): Payer: Medicare Other | Admitting: Nurse Practitioner

## 2015-04-07 ENCOUNTER — Encounter: Payer: Self-pay | Admitting: Nurse Practitioner

## 2015-04-07 ENCOUNTER — Encounter: Payer: Self-pay | Admitting: Internal Medicine

## 2015-04-07 ENCOUNTER — Ambulatory Visit: Payer: Medicare Other

## 2015-04-07 ENCOUNTER — Other Ambulatory Visit (HOSPITAL_BASED_OUTPATIENT_CLINIC_OR_DEPARTMENT_OTHER): Payer: Medicare Other

## 2015-04-07 ENCOUNTER — Ambulatory Visit (HOSPITAL_COMMUNITY)
Admission: RE | Admit: 2015-04-07 | Discharge: 2015-04-07 | Disposition: A | Discharge status: Home / Self Care | Payer: Medicare Other | Source: Ambulatory Visit | Attending: Nurse Practitioner | Admitting: Nurse Practitioner

## 2015-04-07 ENCOUNTER — Telehealth: Payer: Self-pay | Admitting: Nurse Practitioner

## 2015-04-07 VITALS — BP 97/50 | HR 124 | Temp 98.1°F | Resp 20 | Wt 150.8 lb

## 2015-04-07 DIAGNOSIS — D6481 Anemia due to antineoplastic chemotherapy: Secondary | ICD-10-CM

## 2015-04-07 DIAGNOSIS — R404 Transient alteration of awareness: Secondary | ICD-10-CM | POA: Diagnosis not present

## 2015-04-07 DIAGNOSIS — C7951 Secondary malignant neoplasm of bone: Secondary | ICD-10-CM

## 2015-04-07 DIAGNOSIS — E860 Dehydration: Secondary | ICD-10-CM | POA: Diagnosis not present

## 2015-04-07 DIAGNOSIS — C3411 Malignant neoplasm of upper lobe, right bronchus or lung: Secondary | ICD-10-CM

## 2015-04-07 DIAGNOSIS — J90 Pleural effusion, not elsewhere classified: Secondary | ICD-10-CM

## 2015-04-07 DIAGNOSIS — T451X5A Adverse effect of antineoplastic and immunosuppressive drugs, initial encounter: Secondary | ICD-10-CM

## 2015-04-07 DIAGNOSIS — R531 Weakness: Secondary | ICD-10-CM

## 2015-04-07 DIAGNOSIS — R630 Anorexia: Secondary | ICD-10-CM

## 2015-04-07 DIAGNOSIS — E8809 Other disorders of plasma-protein metabolism, not elsewhere classified: Secondary | ICD-10-CM

## 2015-04-07 DIAGNOSIS — R410 Disorientation, unspecified: Secondary | ICD-10-CM

## 2015-04-07 DIAGNOSIS — I951 Orthostatic hypotension: Secondary | ICD-10-CM

## 2015-04-07 DIAGNOSIS — R634 Abnormal weight loss: Secondary | ICD-10-CM

## 2015-04-07 DIAGNOSIS — R432 Parageusia: Secondary | ICD-10-CM

## 2015-04-07 DIAGNOSIS — K5900 Constipation, unspecified: Secondary | ICD-10-CM

## 2015-04-07 LAB — CBC WITH DIFFERENTIAL/PLATELET
BASO%: 0.4 % (ref 0.0–2.0)
BASOS ABS: 0 10*3/uL (ref 0.0–0.1)
EOS%: 0.2 % (ref 0.0–7.0)
Eosinophils Absolute: 0 10*3/uL (ref 0.0–0.5)
HEMATOCRIT: 27 % — AB (ref 38.4–49.9)
HEMOGLOBIN: 9 g/dL — AB (ref 13.0–17.1)
LYMPH%: 16.4 % (ref 14.0–49.0)
MCH: 33.9 pg — AB (ref 27.2–33.4)
MCHC: 33.2 g/dL (ref 32.0–36.0)
MCV: 102 fL — AB (ref 79.3–98.0)
MONO#: 0.8 10*3/uL (ref 0.1–0.9)
MONO%: 14.4 % — ABNORMAL HIGH (ref 0.0–14.0)
NEUT%: 68.6 % (ref 39.0–75.0)
NEUTROS ABS: 3.8 10*3/uL (ref 1.5–6.5)
Platelets: 168 10*3/uL (ref 140–400)
RBC: 2.65 10*6/uL — ABNORMAL LOW (ref 4.20–5.82)
RDW: 15.9 % — AB (ref 11.0–14.6)
WBC: 5.5 10*3/uL (ref 4.0–10.3)
lymph#: 0.9 10*3/uL (ref 0.9–3.3)

## 2015-04-07 LAB — COMPREHENSIVE METABOLIC PANEL (CC13)
ALK PHOS: 101 U/L (ref 40–150)
ALT: 19 U/L (ref 0–55)
AST: 17 U/L (ref 5–34)
Albumin: 2.1 g/dL — ABNORMAL LOW (ref 3.5–5.0)
Anion Gap: 15 mEq/L — ABNORMAL HIGH (ref 3–11)
BILIRUBIN TOTAL: 0.48 mg/dL (ref 0.20–1.20)
BUN: 8 mg/dL (ref 7.0–26.0)
CO2: 26 mEq/L (ref 22–29)
Calcium: 8.4 mg/dL (ref 8.4–10.4)
Chloride: 96 mEq/L — ABNORMAL LOW (ref 98–109)
Creatinine: 0.8 mg/dL (ref 0.7–1.3)
EGFR: 88 mL/min/{1.73_m2} — AB (ref >90)
GLUCOSE: 282 mg/dL — AB (ref 70–140)
Potassium: 3.6 mEq/L (ref 3.5–5.1)
Sodium: 137 mEq/L (ref 136–145)
Total Protein: 6.1 g/dL — ABNORMAL LOW (ref 6.4–8.3)

## 2015-04-07 MED ORDER — SODIUM CHLORIDE 0.9 % IV SOLN
INTRAVENOUS | Status: AC
  Administered 2015-04-07: 1500 mL via INTRAVENOUS

## 2015-04-07 MED ORDER — DRONABINOL 2.5 MG PO CAPS: 2.5000 mg | ORAL_CAPSULE | Freq: Two times a day (BID) | ORAL | Status: DC

## 2015-04-07 MED ORDER — GADOBENATE DIMEGLUMINE 529 MG/ML IV SOLN
14.0000 mL | Freq: Once | INTRAVENOUS | Status: AC | PRN
  Administered 2015-04-07: 14 mL via INTRAVENOUS

## 2015-04-07 NOTE — Assessment & Plan Note (Signed)
Albumin remains low at 2.1.  Patient was encouraged to push protein in his diet is much as possible.

## 2015-04-07 NOTE — Assessment & Plan Note (Signed)
Patient is complaining of minimal appetite and poor oral intake for the past week.  He has lost approximately 5 pounds since last weight check.  He feels dehydrated today.  He is complaining of increased fatigue and generalized weakness as well.  Patient will receive IV fluid rehydration today.  Most likely, increased fatigue/weakness is secondary to chemotherapy side effect; as well as dehydration.

## 2015-04-07 NOTE — Assessment & Plan Note (Signed)
Patient is complaining of minimal appetite and poor oral intake for the past week.  He has lost approximately 5 pounds since last weight check.  He feels dehydrated today.  Patient was encouraged to eat multiple small meals throughout the day; and to push fluids.  Also, patient will receive IV fluid rehydration today as well.

## 2015-04-07 NOTE — Assessment & Plan Note (Addendum)
Patient is complaining of minimal appetite and poor oral intake for the past week.  He has lost approximately 5 pounds since last weight check.  He feels dehydrated today.  Patient was encouraged to eat multiple small meals throughout the day; and to push fluids.  Also, patient will receive IV fluid rehydration today as well.  Will prescribe Marinol for the patient to try as an appetite stimulant as well.

## 2015-04-07 NOTE — Assessment & Plan Note (Signed)
Patient with a resolving right pleural effusion.  Patient had his Pleurx catheter pulled per Dr. Servando Snare recently; and still has the sutures intact from the previous Pleurx catheter insertion site.  Patient's wife states that patient is scheduled to return to Dr. Everrett Coombe office this coming Monday, 04/12/2015 to have his sutures removed.  Suture site appears to be slowly healing; with some very mild, trace erythema surrounding the site.  There is no edema, warmth, or tenderness to the site.  Also, no red streaks noted.

## 2015-04-07 NOTE — Telephone Encounter (Signed)
Mri appointment made and calendar printed for patient

## 2015-04-07 NOTE — Assessment & Plan Note (Signed)
Patient is complaining of minimal appetite, poor oral intake, weight loss, and dehydration.  He denies any specific dizziness with position changes.  Orthostatic blood pressures revealed a lying blood pressure of 124/67 with a heart rate of 122, a sitting blood pressure 116/67 with a heart rate of 121, and a standing blood pressure of 97/56 with a heart rate of 124.  Patient has a history of hypertension; and was previously on 3 different blood pressure medications.  Patient's wife reports the patient has been holding his losartan/HCTZ altogether; and has decreased his metoprolol from 50 mg down to 25 mg daily.  He continues to take the Norvasc 10 mg as previously directed.  Patient's orthostatic hypotension could be secondary to dehydration; but will advise patient to discontinue the metoprolol altogether for the time being as well.  Patient will continue to take the Norvasc 10 mg on a daily basis as previously directed.  Advised both patient and his wife to check his blood pressure at home; and let us know if he blood pressure is either too low or too high.  Also advised patient/wife to follow-up with his primary care provider as well regarding orthostatic hypotension and blood pressure medication management.

## 2015-04-07 NOTE — Assessment & Plan Note (Addendum)
Patient received his last cycle of carboplatin/Alimta chemotherapy on 03/24/2015.  He is scheduled for his next cycle of chemotherapy on 04/15/2015.  Restaging MRI of the brain obtained later this afternoon was negative for brain metastasis.

## 2015-04-07 NOTE — Assessment & Plan Note (Addendum)
Patient wife reports the patient has appeared confused at times recently.  He has been asking questions that are out of character for him; and appears to be hallucinating-speaking to someone who isn't actually there on occasion.  On exam-patient appears intact; and will answer all questions and follow all commands appropriately.  Patient's last brain MRI for restaging purposes was obtained January 2016 with no evidence of metastasis.  Restaging brain MRI obtained at this afternoon was negative for brain metastasis.  Confusion could be secondary to dehydration.

## 2015-04-07 NOTE — Assessment & Plan Note (Signed)
Patient is also complaining of some constipation issues.  Patient states he has been using stool softeners and may relax on a regular basis.  He states he had a very large bowel movement on Monday, 04/05/2015.    On exam-abdomen soft, bowel sounds positive in all 4 quads, and nontender.  Patient was encouraged to continue with stool softeners twice daily; and he use the Mira lax as often as every 6 hours to keep bowels regular.

## 2015-04-07 NOTE — Assessment & Plan Note (Signed)
Chemotherapy-induced anemia with hemoglobin down from 10.5-9.0 today.  Patient is complaining of increased fatigue/weakness in the colon but denies any worsening shortness of breath with exertion.  Will continue to monitor closely.

## 2015-04-07 NOTE — Progress Notes (Signed)
SYMPTOM MANAGEMENT CLINIC   HPI: Omar Peters 80 y.o. male diagnosed with lung cancer.  Currently undergoing carboplatin/Alimta chemotherapy regimen.  Patient is complaining of minimal appetite, poor oral intake, weight loss, and dehydration.  He denies any specific dizziness with position changes.  Orthostatic blood pressures revealed a lying blood pressure of 124/67 with a heart rate of 122, a sitting blood pressure 116/67 with a heart rate of 121, and a standing blood pressure of 97/56 with a heart rate of 124.  Patient has a history of hypertension; and was previously on 3 different blood pressure medications.  Patient's wife reports the patient has been holding his losartan/HCTZ altogether; and has decreased his metoprolol from 50 mg down to 25 mg daily.  He continues to take the Norvasc 10 mg as previously directed.  Patient's wife reports the patient has been acting strangely recently.  He is saying things out of character for him; and acting confused at times.  He also appears to be hallucinating-speaking to people that aren't actually there.  Patient has a history of a right pleural effusion; and recently had his right Pleurx catheter pulled.  He has the sutures intact from the previous Pleurx catheter insertion site.  Patient has an appointment with Dr. Servando Snare this coming Monday, 04/12/2015 to have the sutures removed.  Patient denies any recent fevers or chills.  HPI  ROS  Past Medical History  Diagnosis Date  . Hypertension   . Cancer     Lung  . GERD (gastroesophageal reflux disease)   . Shortness of breath dyspnea     01/14/15 at night and has to sit up  . Anxiety     due to diagnosis of Stage 4 Lung Cancer  . Macular degeneration of right eye     Past Surgical History  Procedure Laterality Date  . Vein ligation and stripping  1977  . Chest tube insertion Right 01/15/2015    Procedure: INSERTION PLEURAL DRAINAGE CATHETER WITH ULTRASOUND AND FLURO GUIDANCE;   Surgeon: Grace Isaac, MD;  Location: [facility] [facility];  Service: Thoracic;  Laterality: Right;  . Removal of pleural drainage catheter Right 03/26/2015    Procedure: REMOVAL OF PLEURAL DRAINAGE CATHETER;  Surgeon: Grace Isaac, MD;  Location: [place];  Service: Thoracic;  Laterality: Right;    has Cavitating mass in right upper lung lobe; Cough; Shortness of breath; Bone metastasis; Cancer of upper lobe of right lung; Hypertension; Hyperglycemia; Anorexia; Nausea with vomiting; Constipation; Pleural effusion; Fall; Dehydration; Weakness; Hypoalbuminemia; Weight loss; Altered level of consciousness; Orthostatic hypotension; and Antineoplastic chemotherapy induced anemia on his problem list.    has No Known Allergies.    Medication List       This list is accurate as of: 04/07/15  7:05 PM.  Always use your most recent med list.               amitriptyline 10 MG tablet  Commonly known as:  ELAVIL  Take 10 mg by mouth at bedtime.     amLODipine 10 MG tablet  Commonly known as:  NORVASC  Take 10 mg by mouth daily.     aspirin 81 MG tablet  Take 81 mg by mouth daily.     dronabinol 2.5 MG capsule  Commonly known as:  MARINOL  Take 1 capsule (2.5 mg total) by mouth 2 (two) times daily before a meal.     folic acid 1 MG tablet  Commonly known as:  FOLVITE  Take  1 tablet (1 mg total) by mouth daily.     HYDROcodone-acetaminophen 5-325 MG per tablet  Commonly known as:  NORCO  Take 1 tablet by mouth every 6 (six) hours as needed for moderate pain.     metFORMIN 500 MG 24 hr tablet  Commonly known as:  GLUCOPHAGE-XR  Take 500 mg by mouth daily with breakfast.     prochlorperazine 10 MG tablet  Commonly known as:  COMPAZINE  Take 1 tablet (10 mg total) by mouth every 6 (six) hours as needed for nausea or vomiting.     ranitidine 150 MG tablet  Commonly known as:  ZANTAC  Take 150 mg by mouth daily.     senna 8.6 MG Tabs tablet  Commonly known as:  SENOKOT  Take 1 tablet by  mouth at bedtime.         PHYSICAL EXAMINATION  Oncology Vitals 04/07/2015 04/07/2015 04/07/2015 04/07/2015 03/26/2015 03/24/2015 03/03/2015  Height - - - - 168 cm 168 cm 168 cm  Weight - - - 68.402 kg 70.308 kg 70.353 kg 73.71 kg  Weight (lbs) - - - 150 lbs 13 oz 155 lbs 155 lbs 2 oz 162 lbs 8 oz  BMI (kg/m2) - - - - 25.02 kg/m2 25.03 kg/m2 26.23 kg/m2  Temp - - - 98.1 97 97.6 98.3  Pulse 124 121 122 124 83 129 103  Resp 20 20 20 22  - 18 18  SpO2 - - - 98 98 100 -  BSA (m2) - - - - 1.81 m2 1.81 m2 1.85 m2   BP Readings from Last 3 Encounters:  04/07/15 97/50  03/26/15 115/51  03/24/15 132/66    Physical Exam  Constitutional: He is oriented to person, place, and time. He appears dehydrated. He appears unhealthy.  Patient appears fatigued, weak, and chronically ill.  HENT:  Head: Normocephalic and atraumatic.  Mouth/Throat: Oropharynx is clear and moist.  Eyes: Conjunctivae and EOM are normal. Pupils are equal, round, and reactive to light. Right eye exhibits no discharge. Left eye exhibits no discharge. No scleral icterus.  Neck: Normal range of motion. Neck supple. No JVD present. No tracheal deviation present. No thyromegaly present.  Cardiovascular: Normal heart sounds and intact distal pulses.   Tachycardic rate  Pulmonary/Chest: Effort normal and breath sounds normal. No respiratory distress. He has no wheezes. He has no rales. He exhibits no tenderness.  Abdominal: Soft. Bowel sounds are normal. He exhibits no distension and no mass. There is no tenderness. There is no rebound and no guarding.  Musculoskeletal: Normal range of motion. He exhibits no edema or tenderness.  Lymphadenopathy:    He has no cervical adenopathy.  Neurological: He is alert and oriented to person, place, and time.  Skin: Skin is warm and dry. No rash noted. No erythema. There is pallor.  Psychiatric: Affect normal.  Nursing note and vitals reviewed.   LABORATORY DATA:. Appointment on 04/07/2015    Component Date Value Ref Range Status  . WBC 04/07/2015 5.5  4.0 - 10.3 10e3/uL Final  . NEUT# 04/07/2015 3.8  1.5 - 6.5 10e3/uL Final  . HGB 04/07/2015 9.0* 13.0 - 17.1 g/dL Final  . HCT 04/07/2015 27.0* 38.4 - 49.9 % Final  . Platelets 04/07/2015 168  140 - 400 10e3/uL Final  . MCV 04/07/2015 102.0* 79.3 - 98.0 fL Final  . MCH 04/07/2015 33.9* 27.2 - 33.4 pg Final  . MCHC 04/07/2015 33.2  32.0 - 36.0 g/dL Final  . RBC 04/07/2015 2.65* 4.20 -  5.82 10e6/uL Final  . RDW 2015/04/13 15.9* 11.0 - 14.6 % Final  . lymph# April 13, 2015 0.9  0.9 - 3.3 10e3/uL Final  . MONO# 04-13-15 0.8  0.1 - 0.9 10e3/uL Final  . Eosinophils Absolute 04-13-2015 0.0  0.0 - 0.5 10e3/uL Final  . Basophils Absolute 13-Apr-2015 0.0  0.0 - 0.1 10e3/uL Final  . NEUT% 2015/04/13 68.6  39.0 - 75.0 % Final  . LYMPH% Apr 13, 2015 16.4  14.0 - 49.0 % Final  . MONO% 2015-04-13 14.4* 0.0 - 14.0 % Final  . EOS% Apr 13, 2015 0.2  0.0 - 7.0 % Final  . BASO% 2015/04/13 0.4  0.0 - 2.0 % Final  . Sodium April 13, 2015 137  136 - 145 mEq/L Final  . Potassium Apr 13, 2015 3.6  3.5 - 5.1 mEq/L Final  . Chloride Apr 13, 2015 96* 98 - 109 mEq/L Final  . CO2 April 13, 2015 26  22 - 29 mEq/L Final  . Glucose 2015/04/13 282* 70 - 140 mg/dl Final  . BUN 13-Apr-2015 8.0  7.0 - 26.0 mg/dL Final  . Creatinine 04-13-2015 0.8  0.7 - 1.3 mg/dL Final  . Total Bilirubin 04/13/2015 0.48  0.20 - 1.20 mg/dL Final  . Alkaline Phosphatase 04-13-2015 101  40 - 150 U/L Final  . AST 04-13-2015 17  5 - 34 U/L Final  . ALT 04-13-15 19  0 - 55 U/L Final  . Total Protein Apr 13, 2015 6.1* 6.4 - 8.3 g/dL Final  . Albumin April 13, 2015 2.1* 3.5 - 5.0 g/dL Final  . Calcium 04/13/2015 8.4  8.4 - 10.4 mg/dL Final  . Anion Gap 13-Apr-2015 15* 3 - 11 mEq/L Final  . EGFR 04/13/2015 88* >90 ml/min/1.73 m2 Final   eGFR is calculated using the CKD-EPI Creatinine Equation (2009)     RADIOGRAPHIC STUDIES: Mr Jeri Cos Wo Contrast  2015/04/13   CLINICAL DATA:  Right upper lobe lung  cancer. Altered level of consciousness. Mental status changes.  EXAM: MRI HEAD WITHOUT AND WITH CONTRAST  TECHNIQUE: Multiplanar, multiecho pulse sequences of the brain and surrounding structures were obtained without and with intravenous contrast.  CONTRAST:  101m MULTIHANCE GADOBENATE DIMEGLUMINE 529 MG/ML IV SOLN  COMPARISON:  MRI brain 01/11/2015  FINDINGS: Moderate atrophy and diffuse white matter disease is similar to the prior exam. The postcontrast images demonstrate no pathologic enhancement to suggest metastatic disease of the brain or meninges.  No acute infarct or hemorrhage is present.  The ventricles are proportionate to the degree of atrophy. Insert pass fluid  Flow is present in the major intracranial arteries. A right lens replacement is noted. The globes and orbits are otherwise intact. The paranasal sinuses and mastoid air cells are clear.  IMPRESSION: 1. Stable atrophy and white matter disease. This likely reflects the sequela of chronic microvascular ischemia. 2. No pathologic enhancement or evidence for metastatic disease to the brain.   Electronically Signed   By: CSan MorelleM.D.   On: [id number]    ASSESSMENT/PLAN:    Cancer of upper lobe of right lung Patient received his last cycle of carboplatin/Alimta chemotherapy on 03/24/2015.  He is scheduled for his next cycle of chemotherapy on 04/15/2015.  Restaging MRI of the brain obtained later this afternoon was negative for brain metastasis.   Anorexia Patient is complaining of minimal appetite and poor oral intake for the past week.  He has lost approximately 5 pounds since last weight check.  He feels dehydrated today.  Patient was encouraged to eat multiple small meals throughout the day; and to push  fluids.  Also, patient will receive IV fluid rehydration today as well.  Will prescribe Marinol for the patient to try as an appetite stimulant as well.   Constipation Patient is also complaining of some  constipation issues.  Patient states he has been using stool softeners and may relax on a regular basis.  He states he had a very large bowel movement on Monday, 04/05/2015.    On exam-abdomen soft, bowel sounds positive in all 4 quads, and nontender.  Patient was encouraged to continue with stool softeners twice daily; and he use the Mira lax as often as every 6 hours to keep bowels regular.   Pleural effusion Patient with a resolving right pleural effusion.  Patient had his Pleurx catheter pulled per Dr. Servando Snare recently; and still has the sutures intact from the previous Pleurx catheter insertion site.  Patient's wife states that patient is scheduled to return to Dr. Everrett Coombe office this coming Monday, 04/12/2015 to have his sutures removed.  Suture site appears to be slowly healing; with some very mild, trace erythema surrounding the site.  There is no edema, warmth, or tenderness to the site.  Also, no red streaks noted.   Dehydration Patient is complaining of minimal appetite and poor oral intake for the past week.  He has lost approximately 5 pounds since last weight check.  He feels dehydrated today.  Patient was encouraged to eat multiple small meals throughout the day; and to push fluids.  Also, patient will receive IV fluid rehydration today as well.    Weakness Patient is complaining of minimal appetite and poor oral intake for the past week.  He has lost approximately 5 pounds since last weight check.  He feels dehydrated today.  He is complaining of increased fatigue and generalized weakness as well.  Patient will receive IV fluid rehydration today.  Most likely, increased fatigue/weakness is secondary to chemotherapy side effect; as well as dehydration.      Hypoalbuminemia Albumin remains low at 2.1.  Patient was encouraged to push protein in his diet is much as possible.   Weight loss Patient is complaining of minimal appetite and poor oral intake for the past week.   He has lost approximately 5 pounds since last weight check.  He feels dehydrated today.  Patient was encouraged to eat multiple small meals throughout the day; and to push fluids.  Also, patient will receive IV fluid rehydration today as well.    Altered level of consciousness Patient wife reports the patient has appeared confused at times recently.  He has been asking questions that are out of character for him; and appears to be hallucinating-speaking to someone who isn't actually there on occasion.  On exam-patient appears intact; and will answer all questions and follow all commands appropriately.  Patient's last brain MRI for restaging purposes was obtained January 2016 with no evidence of metastasis.  Restaging brain MRI obtained at this afternoon was negative for brain metastasis.  Confusion could be secondary to dehydration.   Orthostatic hypotension Patient is complaining of minimal appetite, poor oral intake, weight loss, and dehydration.  He denies any specific dizziness with position changes.  Orthostatic blood pressures revealed a lying blood pressure of 124/67 with a heart rate of 122, a sitting blood pressure 116/67 with a heart rate of 121, and a standing blood pressure of 97/56 with a heart rate of 124.  Patient has a history of hypertension; and was previously on 3 different blood pressure medications.  Patient's wife  reports the patient has been holding his losartan/HCTZ altogether; and has decreased his metoprolol from 50 mg down to 25 mg daily.  He continues to take the Norvasc 10 mg as previously directed.  Patient's orthostatic hypotension could be secondary to dehydration; but will advise patient to discontinue the metoprolol altogether for the time being as well.  Patient will continue to take the Norvasc 10 mg on a daily basis as previously directed.  Advised both patient and his wife to check his blood pressure at home; and let us know if he blood pressure is either too  low or too high.  Also advised patient/wife to follow-up with his primary care provider as well regarding orthostatic hypotension and blood pressure medication management.   Antineoplastic chemotherapy induced anemia Chemotherapy-induced anemia with hemoglobin down from 10.5-9.0 today.  Patient is complaining of increased fatigue/weakness in the colon but denies any worsening shortness of breath with exertion.  Will continue to monitor closely.    Patient stated understanding of all instructions; and was in agreement with this plan of care. The patient knows to call the clinic with any problems, questions or concerns.   This was a shared visit with Dr. Julien Nordmann today.  Total time spent with patient was 40 minutes;  with greater than 75 percent of that time spent in face to face counseling regarding patient's symptoms,  and coordination of care and follow up.  Disclaimer: This note was dictated with voice recognition software. Similar sounding words can inadvertently be transcribed and may not be corrected upon review.   Drue Second, NP 04/07/2015   ADDENDUM: Hematology/Oncology Attending: I had a face to face encounter with the patient. I recommended her care plan. This is a very pleasant 80 years old white male with metastatic non-small cell lung cancer, adenocarcinoma is currently undergoing systemic chemotherapy with carboplatin and Alimta status post 4 cycles and was tolerating his treatment fairly well. Over the last few days the patient is not feeling well and he is not drinking or eating enough. He also has moments of confusion and generalized weakness. He was orthostatic today and the clinic. His antihypertensive medication were decreased recently because of his condition. I recommended for the patient to have repeat MRI of the brain to rule out any metastatic disease to the brain at this point. We will arrange for the patient to receive IV hydration today. The patient was also  encouraged to increase his by mouth intake. He would come back for follow-up visit as scheduled next week for evaluation before starting cycle #5 of his systemic chemotherapy. The patient was advised to call immediately if he has any concerning symptoms in the interval.  Disclaimer: This note was dictated with voice recognition software. Similar sounding words can inadvertently be transcribed and may be missed upon review. Eilleen Kempf., MD 04/10/2015

## 2015-04-07 NOTE — Progress Notes (Signed)
I will place completed form up front with ms. Wilma for patient to pick up today. This will be the original.

## 2015-04-08 ENCOUNTER — Other Ambulatory Visit: Payer: Self-pay | Admitting: Cardiothoracic Surgery

## 2015-04-08 ENCOUNTER — Encounter: Payer: Self-pay | Admitting: Internal Medicine

## 2015-04-08 ENCOUNTER — Telehealth: Payer: Self-pay | Admitting: *Deleted

## 2015-04-08 DIAGNOSIS — J984 Other disorders of lung: Secondary | ICD-10-CM

## 2015-04-08 NOTE — Progress Notes (Signed)
I called and left a message for Omar Peters  [phone] to let me know if she wants me to mail or come here and pick up form --physician stmt.

## 2015-04-08 NOTE — Telephone Encounter (Signed)
Spoke to pt and pt wife. Concern that insurance does not cover new medication. They did purchase it with a coupon but concerned about refills.  Pt slept well last night and is doing better today than in the recent past. Understands to call if any concerns arise or has any questions.

## 2015-04-09 ENCOUNTER — Encounter: Payer: Self-pay | Admitting: Internal Medicine

## 2015-04-09 NOTE — Progress Notes (Signed)
Wife joyce left message they will pick up form at visit on 4.28.16. I have left at front desk with ms. Wilma for the patient/wife to pick up.

## 2015-04-12 ENCOUNTER — Ambulatory Visit (INDEPENDENT_AMBULATORY_CARE_PROVIDER_SITE_OTHER): Payer: Medicare Other | Admitting: Surgical

## 2015-04-12 ENCOUNTER — Ambulatory Visit
Admission: RE | Admit: 2015-04-12 | Discharge: 2015-04-12 | Disposition: A | Discharge status: Home / Self Care | Payer: Medicare Other | Source: Ambulatory Visit | Attending: Cardiothoracic Surgery | Admitting: Cardiothoracic Surgery

## 2015-04-12 VITALS — BP 105/64 | HR 125 | Resp 20 | Ht 66.0 in | Wt 150.0 lb

## 2015-04-12 DIAGNOSIS — J948 Other specified pleural conditions: Secondary | ICD-10-CM | POA: Diagnosis not present

## 2015-04-12 DIAGNOSIS — J984 Other disorders of lung: Secondary | ICD-10-CM

## 2015-04-12 DIAGNOSIS — J90 Pleural effusion, not elsewhere classified: Secondary | ICD-10-CM

## 2015-04-12 DIAGNOSIS — C3411 Malignant neoplasm of upper lobe, right bronchus or lung: Secondary | ICD-10-CM

## 2015-04-12 NOTE — Progress Notes (Signed)
      [address]       [place] [address]             [phone]       Here for suture removal from previous pleurx catheter Tolerated well CXR is stable in appearance  GOLD,WAYNE E, PA-C

## 2015-04-12 NOTE — Patient Instructions (Signed)
none

## 2015-04-15 ENCOUNTER — Telehealth: Payer: Self-pay | Admitting: Physician Assistant

## 2015-04-15 ENCOUNTER — Ambulatory Visit (HOSPITAL_BASED_OUTPATIENT_CLINIC_OR_DEPARTMENT_OTHER): Payer: Medicare Other | Admitting: Physician Assistant

## 2015-04-15 ENCOUNTER — Encounter: Payer: Self-pay | Admitting: Physician Assistant

## 2015-04-15 ENCOUNTER — Encounter: Payer: Self-pay | Admitting: Internal Medicine

## 2015-04-15 ENCOUNTER — Other Ambulatory Visit (HOSPITAL_BASED_OUTPATIENT_CLINIC_OR_DEPARTMENT_OTHER): Payer: Medicare Other

## 2015-04-15 ENCOUNTER — Ambulatory Visit (HOSPITAL_BASED_OUTPATIENT_CLINIC_OR_DEPARTMENT_OTHER): Payer: Medicare Other

## 2015-04-15 VITALS — BP 131/64 | HR 116 | Temp 98.0°F | Resp 19 | Ht 66.0 in | Wt 147.8 lb

## 2015-04-15 DIAGNOSIS — C7951 Secondary malignant neoplasm of bone: Secondary | ICD-10-CM | POA: Diagnosis not present

## 2015-04-15 DIAGNOSIS — C3411 Malignant neoplasm of upper lobe, right bronchus or lung: Secondary | ICD-10-CM

## 2015-04-15 DIAGNOSIS — Z5111 Encounter for antineoplastic chemotherapy: Secondary | ICD-10-CM

## 2015-04-15 LAB — COMPREHENSIVE METABOLIC PANEL (CC13)
ALK PHOS: 96 U/L (ref 40–150)
ALT: 16 U/L (ref 0–55)
ANION GAP: 14 meq/L — AB (ref 3–11)
AST: 19 U/L (ref 5–34)
Albumin: 2.3 g/dL — ABNORMAL LOW (ref 3.5–5.0)
BILIRUBIN TOTAL: 0.34 mg/dL (ref 0.20–1.20)
BUN: 7.4 mg/dL (ref 7.0–26.0)
CO2: 25 meq/L (ref 22–29)
Calcium: 8.8 mg/dL (ref 8.4–10.4)
Chloride: 98 mEq/L (ref 98–109)
Creatinine: 0.9 mg/dL (ref 0.7–1.3)
EGFR: 84 mL/min/{1.73_m2} — ABNORMAL LOW (ref >90)
Glucose: 247 mg/dl — ABNORMAL HIGH (ref 70–140)
Potassium: 3.3 mEq/L — ABNORMAL LOW (ref 3.5–5.1)
SODIUM: 137 meq/L (ref 136–145)
TOTAL PROTEIN: 7.1 g/dL (ref 6.4–8.3)

## 2015-04-15 LAB — CBC WITH DIFFERENTIAL/PLATELET
BASO%: 0.3 % (ref 0.0–2.0)
BASOS ABS: 0 10*3/uL (ref 0.0–0.1)
EOS ABS: 0.1 10*3/uL (ref 0.0–0.5)
EOS%: 0.8 % (ref 0.0–7.0)
HCT: 31.6 % — ABNORMAL LOW (ref 38.4–49.9)
HEMOGLOBIN: 10.7 g/dL — AB (ref 13.0–17.1)
LYMPH#: 1.3 10*3/uL (ref 0.9–3.3)
LYMPH%: 20.8 % (ref 14.0–49.0)
MCH: 34.6 pg — ABNORMAL HIGH (ref 27.2–33.4)
MCHC: 33.9 g/dL (ref 32.0–36.0)
MCV: 102.3 fL — AB (ref 79.3–98.0)
MONO#: 1.2 10*3/uL — ABNORMAL HIGH (ref 0.1–0.9)
MONO%: 20.3 % — ABNORMAL HIGH (ref 0.0–14.0)
NEUT#: 3.5 10*3/uL (ref 1.5–6.5)
NEUT%: 57.8 % (ref 39.0–75.0)
Platelets: 360 10*3/uL (ref 140–400)
RBC: 3.09 10*6/uL — ABNORMAL LOW (ref 4.20–5.82)
RDW: 15.6 % — AB (ref 11.0–14.6)
WBC: 6.1 10*3/uL (ref 4.0–10.3)

## 2015-04-15 MED ORDER — SODIUM CHLORIDE 0.9 % IV SOLN
475.0000 mg/m2 | Freq: Once | INTRAVENOUS | Status: AC
  Administered 2015-04-15: 900 mg via INTRAVENOUS
  Filled 2015-04-15: qty 36

## 2015-04-15 MED ORDER — SODIUM CHLORIDE 0.9 % IV SOLN
Freq: Once | INTRAVENOUS | Status: AC
  Administered 2015-04-15: 14:00:00 via INTRAVENOUS
  Filled 2015-04-15: qty 8

## 2015-04-15 MED ORDER — SODIUM CHLORIDE 0.9 % IV SOLN
456.0000 mg | Freq: Once | INTRAVENOUS | Status: AC
  Administered 2015-04-15: 460 mg via INTRAVENOUS
  Filled 2015-04-15: qty 46

## 2015-04-15 MED ORDER — SODIUM CHLORIDE 0.9 % IV SOLN
Freq: Once | INTRAVENOUS | Status: AC
  Administered 2015-04-15: 13:00:00 via INTRAVENOUS

## 2015-04-15 NOTE — Telephone Encounter (Signed)
Gave avs & calendar for May. Sent message to schedule treatment. °

## 2015-04-15 NOTE — Progress Notes (Signed)
[facility] [phone]   Fax:([phone]) [place], MD [address] [place] [address]  DIAGNOSIS: Stage IV (T3, N3, M1b) non-small cell lung cancer, adenocarcinoma diagnosed in January 2016 presented with right upper lobe lung mass in addition to mediastinal lymphadenopathy and large right pleural effusion as well as bone metastasis.  PRIOR THERAPY: Status post Pleurx catheter placement by Dr. Servando Snare for drainage of the right pleural effusion  CURRENT THERAPY: Systemic chemotherapy with carboplatin for AUC of 5 and Alimta 500 MG/M2 every 3 weeks. Status post 4 cycles.  INTERVAL HISTORY: Omar Peters 80 y.o. male returns to the clinic today for follow-up visit accompanied by his wife. He is due for cycle 4 of treatment today. Overall he is tolerating treatments well. His appetite remains decreased and he has lost a few more pounds. He is drinking 1 - 2 Boost supplements daily.  His constipation has improved with miralax and stool softeners. He denies fevers, chills, nausea, or vomiting. He has some shortness of breath with exertion but denies cough or hemoptysis. He has no pain. He continues to have his pleurex drained every other day. He continues to have less than 500 ccs each time. He reports some issues with constipation but this has improved with the use of Senokot S and stool softeners.   MEDICAL HISTORY: Past Medical History  Diagnosis Date  . Hypertension   . Cancer     Lung  . GERD (gastroesophageal reflux disease)   . Shortness of breath dyspnea     01/14/15 at night and has to sit up  . Anxiety     due to diagnosis of Stage 4 Lung Cancer  . Macular degeneration of right eye     ALLERGIES:  has No Known Allergies.  MEDICATIONS:  Current Outpatient Prescriptions  Medication Sig Dispense Refill  . amitriptyline (ELAVIL) 10 MG tablet Take 10 mg by mouth at bedtime.    Marland Kitchen amLODipine (NORVASC) 10 MG tablet  Take 10 mg by mouth daily.    Marland Kitchen aspirin 81 MG tablet Take 81 mg by mouth daily.    Marland Kitchen dronabinol (MARINOL) 2.5 MG capsule Take 1 capsule (2.5 mg total) by mouth 2 (two) times daily before a meal. 30 capsule 0  . folic acid (FOLVITE) 1 MG tablet Take 1 tablet (1 mg total) by mouth daily. 90 tablet 0  . metFORMIN (GLUCOPHAGE-XR) 500 MG 24 hr tablet Take 500 mg by mouth daily with breakfast.    . prochlorperazine (COMPAZINE) 10 MG tablet Take 1 tablet (10 mg total) by mouth every 6 (six) hours as needed for nausea or vomiting. 30 tablet 0  . ranitidine (ZANTAC) 150 MG tablet Take 150 mg by mouth daily.    Marland Kitchen senna (SENOKOT) 8.6 MG TABS tablet Take 1 tablet by mouth at bedtime.    Marland Kitchen HYDROcodone-acetaminophen (NORCO) 5-325 MG per tablet Take 1 tablet by mouth every 6 (six) hours as needed for moderate pain. (Patient not taking: Reported on 04/15/2015) 30 tablet 0   No current facility-administered medications for this visit.    SURGICAL HISTORY:  Past Surgical History  Procedure Laterality Date  . Vein ligation and stripping  1977  . Chest tube insertion Right 01/15/2015    Procedure: INSERTION PLEURAL DRAINAGE CATHETER WITH ULTRASOUND AND FLURO GUIDANCE;  Surgeon: Grace Isaac, MD;  Location: [facility];  Service: Thoracic;  Laterality: Right;  . Removal of pleural drainage  catheter Right 03/26/2015    Procedure: REMOVAL OF PLEURAL DRAINAGE CATHETER;  Surgeon: Grace Isaac, MD;  Location: [facility];  Service: Thoracic;  Laterality: Right;    REVIEW OF SYSTEMS:  A comprehensive review of systems was negative except for: Respiratory: positive for dyspnea on exertion Gastrointestinal: positive for constipation and lack of appetite.    PHYSICAL EXAMINATION: General appearance: alert, cooperative and no distress Head: Normocephalic, without obvious abnormality, atraumatic Neck: no adenopathy, no JVD, supple, symmetrical, trachea midline and thyroid not enlarged, symmetric, no  tenderness/mass/nodules Lymph nodes: Cervical, supraclavicular, and axillary nodes normal. Resp: clear to auscultation bilaterally Back: symmetric, no curvature. ROM normal. No CVA tenderness. Cardio: regular rate and rhythm, S1, S2 normal, no murmur, click, rub or gallop GI: soft, non-tender; bowel sounds normal; no masses,  no organomegaly Extremities: extremities normal, atraumatic, no cyanosis or edema  ECOG PERFORMANCE STATUS: 1 - Symptomatic but completely ambulatory  Blood pressure 131/64, pulse 116, temperature 98 F (36.7 C), temperature source Oral, resp. rate 19, height '5\' 6"'$  (1.676 m), weight 147 lb 12.8 oz (67.042 kg), SpO2 100 %.  LABORATORY DATA: Lab Results  Component Value Date   WBC 6.1 04/15/2015   HGB 10.7* 04/15/2015   HCT 31.6* 04/15/2015   MCV 102.3* 04/15/2015   PLT 360 04/15/2015      Chemistry      Component Value Date/Time   NA 137 04/15/2015 1018   NA 138 01/15/2015 0608   K 3.3* 04/15/2015 1018   K 3.5 01/15/2015 0608   CL 97 01/15/2015 0608   CO2 25 04/15/2015 1018   CO2 34* 01/15/2015 0608   BUN 7.4 04/15/2015 1018   BUN 13 01/15/2015 0608   CREATININE 0.9 04/15/2015 1018   CREATININE 0.96 01/15/2015 0608   GLU 231* 03/10/2015 1539      Component Value Date/Time   CALCIUM 8.8 04/15/2015 1018   CALCIUM 9.4 01/15/2015 0608   ALKPHOS 96 04/15/2015 1018   ALKPHOS 102 01/15/2015 0608   AST 19 04/15/2015 1018   AST 42* 01/15/2015 0608   ALT 16 04/15/2015 1018   ALT 38 01/15/2015 0608   BILITOT 0.34 04/15/2015 1018   BILITOT 0.8 01/15/2015 0608       RADIOGRAPHIC STUDIES: Dg Chest 2 View  04/12/2015   CLINICAL DATA:  Lung cancer.  Pleural effusion.  EXAM: CHEST  2 VIEW  COMPARISON:  CT 03/23/2016.  Chest x-ray 01/16/2016.  FINDINGS: Interval removal of right PleurX catheter. Mediastinum hilar structures are stable. Persist atelectasis and consolidation right upper lobe. Stable right pleural effusion. Heart size stable. No acute bony  abnormality .  IMPRESSION: 1. Interim removal right PleurX catheter. 2. Persistent atelectasis and consolidation right upper lobe. Persistent right pleural effusion and pleural thickening.   Electronically Signed   By: Marcello Moores  Register   On: 04/12/2015 13:24   Ct Chest W Contrast  03/24/2015   CLINICAL DATA:  Restaging lung cancer diagnosed in January 2016. Undergoing chemotherapy treatment.  EXAM: CT CHEST, ABDOMEN, AND PELVIS WITH CONTRAST  TECHNIQUE: Multidetector CT imaging of the chest, abdomen and pelvis was performed following the standard protocol during bolus administration of intravenous contrast.  CONTRAST:  142m OMNIPAQUE IOHEXOL 300 MG/ML  SOLN  COMPARISON:  Chest CT 12/16/2014 and PET-CT 12/29/2014  FINDINGS: CT CHEST FINDINGS  Chest wall: No chest wall mass, supraclavicular or axillary adenopathy. There are few small stable supraclavicular lymph nodes bilaterally. A small stable left thyroid nodule is noted. The bony thorax is intact.  Stable right scapular lesion. There is a small sclerotic lesion involving T11 which I do not identify on the prior study and could represent a new metastasis.  Mediastinum: The heart is normal in size. No pericardial effusion. The aorta is normal in caliber. Stable atherosclerotic calcifications. Stable coronary artery calcifications. The mediastinal lymph nodes are slightly smaller.  The right paratracheal node on image 13 measures 9 mm and previously measured 11 mm.  Precarinal lymph node on image number 23 measures 13 mm and previously measured 16 mm.  Aorticopulmonary window node on image number 24 measures 10 mm and previously measured 11 mm.  Subcarinal lymph node on image number 28 measures 7 mm and previously measured 10 mm.  Lungs/ pleura: The right upper lung mass measures approximately 7 x 5 cm and previously measured 10 x 6 cm. It is difficult to assess tumor versus drowned/obstructed lung. There is a rim enhancing pleural fluid collection around the mass  at the lung apex and a right basilar pleural effusion with overlying atelectasis and a PleurX drainage catheter in place. Overall early pleural effusion is much smaller. There is moderate interstitial thickening in the lungs and around the mass. Could not exclude interstitial spread of tumor.  No new pulmonary nodules to suggest metastatic disease.  Stable emphysematous changes in the left lung. No left lung nodules.  CT ABDOMEN AND PELVIS FINDINGS  Hepatobiliary: Suspect cirrhotic changes involving the liver with an irregular contour and heterogeneous attenuation. No focal hepatic lesions or intrahepatic biliary dilatation. The gallbladder wall all demonstrates mild enhancement and possible mild wall thickening. This could be due to the cirrhosis. The common bile duct is within normal limits for age.  Pancreas:  Normal.  Spleen:  Normal.  Adrenal glands/kidneys:  No significant findings.  Stomach and bowel: The stomach, duodenum, small bowel and colon are unremarkable. No inflammatory changes, mass lesions or obstructive findings. The terminal ileum is normal. The appendix is normal.  Vascular/lymphatic: Stable advanced atherosclerotic changes involving the aorta and branch vessel ostia. No focal aneurysm or dissection. Small scattered mesenteric and retroperitoneal lymph nodes but no mass or adenopathy.  Pelvis: The bladder, prostate gland and seminal vesicles are unremarkable. No pelvic mass or adenopathy. No free pelvic fluid collections. No inguinal mass or adenopathy.  Musculoskeletal: Stable large lytic lesion involving the right iliac bone which was previously biopsy. New small rounded sclerotic lesion near the S1 nerve foramen is likely an new metastasis.  IMPRESSION: 1. Slight interval decrease in size of the right upper lobe lung mass although it is difficult to tell tumor from drowned/obstructed lung. 2. PleurX drainage catheter in place on the right side with decrease in right-sided pleural effusion. 3.  Slightly smaller mediastinal lymph nodes as detailed above. 4. Stable bone lesions involving the right scapula and right iliac crest. Suspect new small sclerotic metastasis involving T11 and S1. 5. No findings for metastatic disease involving the abdomen/pelvis.   Electronically Signed   By: Marijo Sanes M.D.   On: 03/24/2015 14:16   Mr Jeri Cos AT Contrast  04/07/2015   CLINICAL DATA:  Right upper lobe lung cancer. Altered level of consciousness. Mental status changes.  EXAM: MRI HEAD WITHOUT AND WITH CONTRAST  TECHNIQUE: Multiplanar, multiecho pulse sequences of the brain and surrounding structures were obtained without and with intravenous contrast.  CONTRAST:  57m MULTIHANCE GADOBENATE DIMEGLUMINE 529 MG/ML IV SOLN  COMPARISON:  MRI brain 01/11/2015  FINDINGS: Moderate atrophy and diffuse white matter disease is similar to the  prior exam. The postcontrast images demonstrate no pathologic enhancement to suggest metastatic disease of the brain or meninges.  No acute infarct or hemorrhage is present.  The ventricles are proportionate to the degree of atrophy. Insert pass fluid  Flow is present in the major intracranial arteries. A right lens replacement is noted. The globes and orbits are otherwise intact. The paranasal sinuses and mastoid air cells are clear.  IMPRESSION: 1. Stable atrophy and white matter disease. This likely reflects the sequela of chronic microvascular ischemia. 2. No pathologic enhancement or evidence for metastatic disease to the brain.   Electronically Signed   By: San Morelle M.D.   On: 04/07/2015 16:18   Ct Abdomen Pelvis W Contrast  03/24/2015   CLINICAL DATA:  Restaging lung cancer diagnosed in January 2016. Undergoing chemotherapy treatment.  EXAM: CT CHEST, ABDOMEN, AND PELVIS WITH CONTRAST  TECHNIQUE: Multidetector CT imaging of the chest, abdomen and pelvis was performed following the standard protocol during bolus administration of intravenous contrast.  CONTRAST:   168m OMNIPAQUE IOHEXOL 300 MG/ML  SOLN  COMPARISON:  Chest CT 12/16/2014 and PET-CT 12/29/2014  FINDINGS: CT CHEST FINDINGS  Chest wall: No chest wall mass, supraclavicular or axillary adenopathy. There are few small stable supraclavicular lymph nodes bilaterally. A small stable left thyroid nodule is noted. The bony thorax is intact. Stable right scapular lesion. There is a small sclerotic lesion involving T11 which I do not identify on the prior study and could represent a new metastasis.  Mediastinum: The heart is normal in size. No pericardial effusion. The aorta is normal in caliber. Stable atherosclerotic calcifications. Stable coronary artery calcifications. The mediastinal lymph nodes are slightly smaller.  The right paratracheal node on image 13 measures 9 mm and previously measured 11 mm.  Precarinal lymph node on image number 23 measures 13 mm and previously measured 16 mm.  Aorticopulmonary window node on image number 24 measures 10 mm and previously measured 11 mm.  Subcarinal lymph node on image number 28 measures 7 mm and previously measured 10 mm.  Lungs/ pleura: The right upper lung mass measures approximately 7 x 5 cm and previously measured 10 x 6 cm. It is difficult to assess tumor versus drowned/obstructed lung. There is a rim enhancing pleural fluid collection around the mass at the lung apex and a right basilar pleural effusion with overlying atelectasis and a PleurX drainage catheter in place. Overall early pleural effusion is much smaller. There is moderate interstitial thickening in the lungs and around the mass. Could not exclude interstitial spread of tumor.  No new pulmonary nodules to suggest metastatic disease.  Stable emphysematous changes in the left lung. No left lung nodules.  CT ABDOMEN AND PELVIS FINDINGS  Hepatobiliary: Suspect cirrhotic changes involving the liver with an irregular contour and heterogeneous attenuation. No focal hepatic lesions or intrahepatic biliary  dilatation. The gallbladder wall all demonstrates mild enhancement and possible mild wall thickening. This could be due to the cirrhosis. The common bile duct is within normal limits for age.  Pancreas:  Normal.  Spleen:  Normal.  Adrenal glands/kidneys:  No significant findings.  Stomach and bowel: The stomach, duodenum, small bowel and colon are unremarkable. No inflammatory changes, mass lesions or obstructive findings. The terminal ileum is normal. The appendix is normal.  Vascular/lymphatic: Stable advanced atherosclerotic changes involving the aorta and branch vessel ostia. No focal aneurysm or dissection. Small scattered mesenteric and retroperitoneal lymph nodes but no mass or adenopathy.  Pelvis: The bladder, prostate gland  and seminal vesicles are unremarkable. No pelvic mass or adenopathy. No free pelvic fluid collections. No inguinal mass or adenopathy.  Musculoskeletal: Stable large lytic lesion involving the right iliac bone which was previously biopsy. New small rounded sclerotic lesion near the S1 nerve foramen is likely an new metastasis.  IMPRESSION: 1. Slight interval decrease in size of the right upper lobe lung mass although it is difficult to tell tumor from drowned/obstructed lung. 2. PleurX drainage catheter in place on the right side with decrease in right-sided pleural effusion. 3. Slightly smaller mediastinal lymph nodes as detailed above. 4. Stable bone lesions involving the right scapula and right iliac crest. Suspect new small sclerotic metastasis involving T11 and S1. 5. No findings for metastatic disease involving the abdomen/pelvis.   Electronically Signed   By: Marijo Sanes M.D.   On: 03/24/2015 14:16    ASSESSMENT AND PLAN: This is a very pleasant 80 years old white male recently diagnosed with a stage IV non-small cell lung cancer, adenocarcinoma and currently undergoing systemic chemotherapy with carboplatin and Alimta status post 4 cycles.  Overall Mr. Reish is tolerating  his chemotherapy relatively well . He is again encouraged to increase his by mouth intake of food and fluids. The patient was discussed with and also seen by Dr. Julien Nordmann. His recent restaging CT scan showed improvement in his disease. He will proceed with cycle #5 today as scheduled. He will continue with weekly labs as scheduled. He will follow up in 3 weeks prior to the start of cycle #6.  He understands and agrees with this plan. He has been encouraged to call with any issues that might arise before his next visit here.   Carlton Adam, PA-C 04/15/2015

## 2015-04-15 NOTE — Progress Notes (Signed)
Pt's spouse inquired about finding assistance for Alimta.  I reached out to [employer] and as of right now they are not accepting applications for pt's Dx.  I will revisit the site the 1st week in May to see if funds have opened up.

## 2015-04-15 NOTE — Patient Instructions (Signed)
[facility] Discharge Instructions for Patients Receiving Chemotherapy  Today you received the following chemotherapy agents carboplatin, alimta  To help prevent nausea and vomiting after your treatment, we encourage you to take your nausea medication as prescribed   If you develop nausea and vomiting that is not controlled by your nausea medication, call the clinic.   BELOW ARE SYMPTOMS THAT SHOULD BE REPORTED IMMEDIATELY:  *FEVER GREATER THAN 100.5 F  *CHILLS WITH OR WITHOUT FEVER  NAUSEA AND VOMITING THAT IS NOT CONTROLLED WITH YOUR NAUSEA MEDICATION  *UNUSUAL SHORTNESS OF BREATH  *UNUSUAL BRUISING OR BLEEDING  TENDERNESS IN MOUTH AND THROAT WITH OR WITHOUT PRESENCE OF ULCERS  *URINARY PROBLEMS  *BOWEL PROBLEMS  UNUSUAL RASH Items with * indicate a potential emergency and should be followed up as soon as possible.  Feel free to call the clinic you have any questions or concerns. The clinic phone number is [phone].  Please show the [facility] at check-in to the Emergency Department and triage nurse.

## 2015-04-17 NOTE — Patient Instructions (Signed)
Continue weekly labs as scheduled. Follow up in 3 weeks, prior to your next scheduled cycle of chemotherapy 

## 2015-04-21 ENCOUNTER — Other Ambulatory Visit (HOSPITAL_BASED_OUTPATIENT_CLINIC_OR_DEPARTMENT_OTHER): Payer: Medicare Other

## 2015-04-21 DIAGNOSIS — C7951 Secondary malignant neoplasm of bone: Secondary | ICD-10-CM

## 2015-04-21 DIAGNOSIS — C3411 Malignant neoplasm of upper lobe, right bronchus or lung: Secondary | ICD-10-CM | POA: Diagnosis not present

## 2015-04-21 LAB — COMPREHENSIVE METABOLIC PANEL (CC13)
ALK PHOS: 100 U/L (ref 40–150)
ALT: 45 U/L (ref 0–55)
ANION GAP: 13 meq/L — AB (ref 3–11)
AST: 31 U/L (ref 5–34)
Albumin: 2.5 g/dL — ABNORMAL LOW (ref 3.5–5.0)
BUN: 14.4 mg/dL (ref 7.0–26.0)
CO2: 27 meq/L (ref 22–29)
CREATININE: 0.8 mg/dL (ref 0.7–1.3)
Calcium: 9.2 mg/dL (ref 8.4–10.4)
Chloride: 98 mEq/L (ref 98–109)
EGFR: 85 mL/min/{1.73_m2} — AB (ref >90)
Glucose: 257 mg/dl — ABNORMAL HIGH (ref 70–140)
Potassium: 3.5 mEq/L (ref 3.5–5.1)
Sodium: 138 mEq/L (ref 136–145)
Total Bilirubin: 0.6 mg/dL (ref 0.20–1.20)
Total Protein: 6.9 g/dL (ref 6.4–8.3)

## 2015-04-21 LAB — CBC WITH DIFFERENTIAL/PLATELET
BASO%: 1.4 % (ref 0.0–2.0)
BASOS ABS: 0.1 10*3/uL (ref 0.0–0.1)
EOS ABS: 0 10*3/uL (ref 0.0–0.5)
EOS%: 0.3 % (ref 0.0–7.0)
HEMATOCRIT: 30.5 % — AB (ref 38.4–49.9)
HGB: 10.3 g/dL — ABNORMAL LOW (ref 13.0–17.1)
LYMPH#: 1 10*3/uL (ref 0.9–3.3)
LYMPH%: 25.2 % (ref 14.0–49.0)
MCH: 34.4 pg — AB (ref 27.2–33.4)
MCHC: 33.9 g/dL (ref 32.0–36.0)
MCV: 101.4 fL — ABNORMAL HIGH (ref 79.3–98.0)
MONO#: 0.1 10*3/uL (ref 0.1–0.9)
MONO%: 3.3 % (ref 0.0–14.0)
NEUT#: 2.8 10*3/uL (ref 1.5–6.5)
NEUT%: 69.8 % (ref 39.0–75.0)
Platelets: 242 10*3/uL (ref 140–400)
RBC: 3.01 10*6/uL — ABNORMAL LOW (ref 4.20–5.82)
RDW: 15.8 % — ABNORMAL HIGH (ref 11.0–14.6)
WBC: 4 10*3/uL (ref 4.0–10.3)

## 2015-04-28 ENCOUNTER — Ambulatory Visit (HOSPITAL_BASED_OUTPATIENT_CLINIC_OR_DEPARTMENT_OTHER): Payer: Medicare Other | Admitting: Nurse Practitioner

## 2015-04-28 ENCOUNTER — Other Ambulatory Visit (HOSPITAL_BASED_OUTPATIENT_CLINIC_OR_DEPARTMENT_OTHER): Payer: Medicare Other

## 2015-04-28 ENCOUNTER — Encounter: Payer: Self-pay | Admitting: Nurse Practitioner

## 2015-04-28 ENCOUNTER — Other Ambulatory Visit: Payer: Self-pay

## 2015-04-28 VITALS — BP 89/49 | HR 110 | Temp 97.8°F | Resp 16 | Wt 146.1 lb

## 2015-04-28 DIAGNOSIS — E860 Dehydration: Secondary | ICD-10-CM

## 2015-04-28 DIAGNOSIS — E8809 Other disorders of plasma-protein metabolism, not elsewhere classified: Secondary | ICD-10-CM

## 2015-04-28 DIAGNOSIS — C3411 Malignant neoplasm of upper lobe, right bronchus or lung: Secondary | ICD-10-CM

## 2015-04-28 DIAGNOSIS — K5900 Constipation, unspecified: Secondary | ICD-10-CM

## 2015-04-28 DIAGNOSIS — R630 Anorexia: Secondary | ICD-10-CM

## 2015-04-28 DIAGNOSIS — R531 Weakness: Secondary | ICD-10-CM

## 2015-04-28 DIAGNOSIS — R634 Abnormal weight loss: Secondary | ICD-10-CM

## 2015-04-28 DIAGNOSIS — D6481 Anemia due to antineoplastic chemotherapy: Secondary | ICD-10-CM

## 2015-04-28 DIAGNOSIS — I951 Orthostatic hypotension: Secondary | ICD-10-CM

## 2015-04-28 DIAGNOSIS — T451X5A Adverse effect of antineoplastic and immunosuppressive drugs, initial encounter: Secondary | ICD-10-CM

## 2015-04-28 LAB — CBC WITH DIFFERENTIAL/PLATELET
BASO%: 0.2 % (ref 0.0–2.0)
BASOS ABS: 0 10*3/uL (ref 0.0–0.1)
EOS%: 0.1 % (ref 0.0–7.0)
Eosinophils Absolute: 0 10*3/uL (ref 0.0–0.5)
HEMATOCRIT: 28.3 % — AB (ref 38.4–49.9)
HEMOGLOBIN: 9.6 g/dL — AB (ref 13.0–17.1)
LYMPH#: 0.7 10*3/uL — AB (ref 0.9–3.3)
LYMPH%: 17 % (ref 14.0–49.0)
MCH: 34.5 pg — ABNORMAL HIGH (ref 27.2–33.4)
MCHC: 34 g/dL (ref 32.0–36.0)
MCV: 101.5 fL — ABNORMAL HIGH (ref 79.3–98.0)
MONO#: 0.5 10*3/uL (ref 0.1–0.9)
MONO%: 12.4 % (ref 0.0–14.0)
NEUT#: 2.9 10*3/uL (ref 1.5–6.5)
NEUT%: 70.3 % (ref 39.0–75.0)
Platelets: 113 10*3/uL — ABNORMAL LOW (ref 140–400)
RBC: 2.79 10*6/uL — ABNORMAL LOW (ref 4.20–5.82)
RDW: 15.3 % — ABNORMAL HIGH (ref 11.0–14.6)
WBC: 4.1 10*3/uL (ref 4.0–10.3)

## 2015-04-28 LAB — COMPREHENSIVE METABOLIC PANEL (CC13)
ALBUMIN: 2.6 g/dL — AB (ref 3.5–5.0)
ALT: 30 U/L (ref 0–55)
ANION GAP: 14 meq/L — AB (ref 3–11)
AST: 22 U/L (ref 5–34)
Alkaline Phosphatase: 111 U/L (ref 40–150)
BUN: 9.6 mg/dL (ref 7.0–26.0)
CALCIUM: 9.3 mg/dL (ref 8.4–10.4)
CHLORIDE: 96 meq/L — AB (ref 98–109)
CO2: 28 meq/L (ref 22–29)
Creatinine: 0.9 mg/dL (ref 0.7–1.3)
EGFR: 80 mL/min/{1.73_m2} — ABNORMAL LOW (ref >90)
Glucose: 224 mg/dl — ABNORMAL HIGH (ref 70–140)
POTASSIUM: 3.5 meq/L (ref 3.5–5.1)
Sodium: 138 mEq/L (ref 136–145)
Total Bilirubin: 0.53 mg/dL (ref 0.20–1.20)
Total Protein: 7.2 g/dL (ref 6.4–8.3)

## 2015-04-28 MED ORDER — SODIUM CHLORIDE 0.9 % IV SOLN
INTRAVENOUS | Status: AC
  Administered 2015-04-28: 12:00:00 via INTRAVENOUS

## 2015-04-28 MED ORDER — SODIUM CHLORIDE 0.9 % IV SOLN
Freq: Once | INTRAVENOUS | Status: AC
  Administered 2015-04-28: 12:00:00 via INTRAVENOUS
  Filled 2015-04-28: qty 4

## 2015-04-28 NOTE — Assessment & Plan Note (Addendum)
Patient has history of chronic constipation.  He states that he uses stool softeners on a daily basis; as well as milk of magnesia.  Has also tried milk of magnesia recently with fairly good results.

## 2015-04-28 NOTE — Patient Instructions (Signed)

## 2015-04-28 NOTE — Assessment & Plan Note (Signed)
Patient is complaining of minimal appetite, poor oral intake, dehydration, and progressive weakness.  Patient was also orthostatic; with standing blood pressure 89/49.  Patient states that he took his Norvasc 10 mg earlier today.  As directed.  Advised patient to hold all blood pressure medication for the time being.  Patient was given IV fluid rehydration while cancer Center today.  He was also encouraged to multiple small meals throughout the day and to push fluids.  If at all possible.

## 2015-04-28 NOTE — Assessment & Plan Note (Signed)
Patient continues to complain of minimal appetite and very poor oral intake.  He feels dehydrated today.  Patient will receive 1 L normal saline IV fluid rehydration while at the cancer Center today.  He was also encouraged to push fluids as much as possible.

## 2015-04-28 NOTE — Assessment & Plan Note (Signed)
Lying blood pressure was 119/59, sitting blood pressure was 115/62, and standing blood pressure was 89/49 today.  Confirmed the patient did take his Norvasc 10 mg this morning as previously directed.    Most likely, orthostatic hypotension is secondary to both chemotherapy and dehydration.    Patient was advised to hold all blood pressure medication for the time being.  He was also given IV fluid rehydration while cancer Center today.  He was encouraged to push fluids is much as possible at home.  Also, patient was advised to follow-up with his primary care physician to let him know that we are currently holding all blood pressure medication due to orthostatic hypotension.

## 2015-04-28 NOTE — Assessment & Plan Note (Signed)
Chemotherapy therapy-induced anemia; hemoglobin 9.6  .  Will continue to monitor closely.

## 2015-04-28 NOTE — Progress Notes (Signed)
SYMPTOM MANAGEMENT CLINIC   HPI: Omar Peters 80 y.o. male diagnosed with lung cancer.  Currently undergoing carboplatin/Alimta chemotherapy regimen.  Patient received his last cycle of chemotherapy on 04/07/2015.  He presented to the [facility] today with complaint of minimal appetite, poor oral intake, dehydration, and progressive weakness.  He reports feeling dizzy when he makes sudden position changes.  He complains of some chronic nausea; but no vomiting.  He denies any diarrhea.  He has had some chronic issues with constipation; but states he uses stool softeners, [place], and occasional milk of magnesia.  He denies any recent fevers or chills.  Lying blood pressure was 119/59, sitting blood pressure was 115/62, and standing blood pressure was 89/49 today.  Confirmed the patient did take his Norvasc 10 mg this morning as previously directed.   HPI  ROS  Past Medical History  Diagnosis Date  . Hypertension   . Cancer     Lung  . GERD (gastroesophageal reflux disease)   . Shortness of breath dyspnea     01/14/15 at night and has to sit up  . Anxiety     due to diagnosis of Stage 4 Lung Cancer  . Macular degeneration of right eye     Past Surgical History  Procedure Laterality Date  . Vein ligation and stripping  1977  . Chest tube insertion Right 01/15/2015    Procedure: INSERTION PLEURAL DRAINAGE CATHETER WITH ULTRASOUND AND FLURO GUIDANCE;  Surgeon: Grace Isaac, MD;  Location: [facility];  Service: Thoracic;  Laterality: Right;  . Removal of pleural drainage catheter Right 03/26/2015    Procedure: REMOVAL OF PLEURAL DRAINAGE CATHETER;  Surgeon: Grace Isaac, MD;  Location: [facility];  Service: Thoracic;  Laterality: Right;    has Cavitating mass in right upper lung lobe; Cough; Shortness of breath; Bone metastasis; Cancer of upper lobe of right lung; Hypertension; Hyperglycemia; Anorexia; Nausea with vomiting; Constipation; Pleural effusion; Fall; Dehydration;  Weakness; Hypoalbuminemia; Weight loss; Altered level of consciousness; Orthostatic hypotension; and Antineoplastic chemotherapy induced anemia on his problem list.    is allergic to marinol.    Medication List       This list is accurate as of: 04/28/15  5:16 PM.  Always use your most recent med list.               amitriptyline 10 MG tablet  Commonly known as:  ELAVIL  Take 10 mg by mouth at bedtime.     aspirin 81 MG tablet  Take 81 mg by mouth daily.     folic acid 1 MG tablet  Commonly known as:  FOLVITE  Take 1 tablet (1 mg total) by mouth daily.     HYDROcodone-acetaminophen 5-325 MG per tablet  Commonly known as:  NORCO  Take 1 tablet by mouth every 6 (six) hours as needed for moderate pain.     metFORMIN 500 MG 24 hr tablet  Commonly known as:  GLUCOPHAGE-XR  Take 500 mg by mouth daily with breakfast.     prochlorperazine 10 MG tablet  Commonly known as:  COMPAZINE  Take 1 tablet (10 mg total) by mouth every 6 (six) hours as needed for nausea or vomiting.     ranitidine 150 MG tablet  Commonly known as:  ZANTAC  Take 150 mg by mouth daily.     senna 8.6 MG Tabs tablet  Commonly known as:  SENOKOT  Take 1 tablet by mouth at bedtime.  PHYSICAL EXAMINATION  Oncology Vitals 04/28/2015 04/28/2015 04/28/2015 04/28/2015 04/15/2015 04/15/2015 04/12/2015  Height - - - - - 168 cm 168 cm  Weight - - - 66.271 kg - 67.042 kg 68.04 kg  Weight (lbs) - - - 146 lbs 2 oz - 147 lbs 13 oz 150 lbs  BMI (kg/m2) - - - - - 23.86 kg/m2 24.21 kg/m2  Temp - - - 97.8 - 98 -  Pulse 110 103 101 90 116 120 125  Resp - - - 16 - 19 20  SpO2 - - - - 100 99 94  BSA (m2) - - - - - 1.77 m2 1.78 m2   BP Readings from Last 3 Encounters:  04/28/15 89/49  04/15/15 131/64  04/12/15 105/64    Physical Exam  Constitutional: He is oriented to person, place, and time.  Patient appears fatigued, weak, frail, and chronically ill.  HENT:  Head: Normocephalic and atraumatic.    Mouth/Throat: Oropharynx is clear and moist.  Eyes: Conjunctivae and EOM are normal. Pupils are equal, round, and reactive to light. Right eye exhibits no discharge. Left eye exhibits no discharge. No scleral icterus.  Neck: Normal range of motion. Neck supple. No JVD present. No tracheal deviation present. No thyromegaly present.  Cardiovascular: Normal rate, regular rhythm, normal heart sounds and intact distal pulses.   Pulmonary/Chest: Effort normal and breath sounds normal. No respiratory distress. He has no wheezes. He has no rales. He exhibits no tenderness.  Abdominal: Soft. Bowel sounds are normal. He exhibits no distension and no mass. There is no tenderness. There is no rebound and no guarding.  Musculoskeletal: Normal range of motion. He exhibits no edema or tenderness.  Lymphadenopathy:    He has no cervical adenopathy.  Neurological: He is alert and oriented to person, place, and time.  Patient appears progressively weak; and needed assistance ambulating to and from wheelchair.  Skin: Skin is warm and dry. No rash noted. No erythema. There is pallor.  Psychiatric: Affect normal.  Nursing note and vitals reviewed.   LABORATORY DATA:. Appointment on 04/28/2015  Component Date Value Ref Range Status  . WBC 04/28/2015 4.1  4.0 - 10.3 10e3/uL Final  . NEUT# 04/28/2015 2.9  1.5 - 6.5 10e3/uL Final  . HGB 04/28/2015 9.6* 13.0 - 17.1 g/dL Final  . HCT 04/28/2015 28.3* 38.4 - 49.9 % Final  . Platelets 04/28/2015 113* 140 - 400 10e3/uL Final  . MCV 04/28/2015 101.5* 79.3 - 98.0 fL Final  . MCH 04/28/2015 34.5* 27.2 - 33.4 pg Final  . MCHC 04/28/2015 34.0  32.0 - 36.0 g/dL Final  . RBC 04/28/2015 2.79* 4.20 - 5.82 10e6/uL Final  . RDW 04/28/2015 15.3* 11.0 - 14.6 % Final  . lymph# 04/28/2015 0.7* 0.9 - 3.3 10e3/uL Final  . MONO# 04/28/2015 0.5  0.1 - 0.9 10e3/uL Final  . Eosinophils Absolute 04/28/2015 0.0  0.0 - 0.5 10e3/uL Final  . Basophils Absolute 04/28/2015 0.0  0.0 - 0.1  10e3/uL Final  . NEUT% 04/28/2015 70.3  39.0 - 75.0 % Final  . LYMPH% 04/28/2015 17.0  14.0 - 49.0 % Final  . MONO% 04/28/2015 12.4  0.0 - 14.0 % Final  . EOS% 04/28/2015 0.1  0.0 - 7.0 % Final  . BASO% 04/28/2015 0.2  0.0 - 2.0 % Final  . Sodium 04/28/2015 138  136 - 145 mEq/L Final  . Potassium 04/28/2015 3.5  3.5 - 5.1 mEq/L Final  . Chloride 04/28/2015 96* 98 - 109 mEq/L Final  .  CO2 04/28/2015 28  22 - 29 mEq/L Final  . Glucose 04/28/2015 224* 70 - 140 mg/dl Final  . BUN 04/28/2015 9.6  7.0 - 26.0 mg/dL Final  . Creatinine 04/28/2015 0.9  0.7 - 1.3 mg/dL Final  . Total Bilirubin 04/28/2015 0.53  0.20 - 1.20 mg/dL Final  . Alkaline Phosphatase 04/28/2015 111  40 - 150 U/L Final  . AST 04/28/2015 22  5 - 34 U/L Final  . ALT 04/28/2015 30  0 - 55 U/L Final  . Total Protein 04/28/2015 7.2  6.4 - 8.3 g/dL Final  . Albumin 04/28/2015 2.6* 3.5 - 5.0 g/dL Final  . Calcium 04/28/2015 9.3  8.4 - 10.4 mg/dL Final  . Anion Gap 04/28/2015 14* 3 - 11 mEq/L Final  . EGFR 04/28/2015 80* >90 ml/min/1.73 m2 Final   eGFR is calculated using the CKD-EPI Creatinine Equation (2009)     RADIOGRAPHIC STUDIES: No results found.  ASSESSMENT/PLAN:    Anorexia Patient reports chronic loss of appetite; he continues to lose weight.  Most likely, this is a chemotherapy side effect.  Patient was encouraged to eat multiple small meals throughout the day; and to continue pushing fluids.   Constipation Patient has history of chronic constipation.  He states that he uses stool softeners on a daily basis; as well as milk of magnesia.  Has also tried milk of magnesia recently with fairly good results.   Dehydration Patient continues to complain of minimal appetite and very poor oral intake.  He feels dehydrated today.  Patient will receive 1 L normal saline IV fluid rehydration while at the cancer Center today.  He was also encouraged to push fluids as much as possible.   Weakness Patient is complaining of  minimal appetite, poor oral intake, dehydration, and progressive weakness.  Patient was also orthostatic; with standing blood pressure 89/49.  Patient states that he took his Norvasc 10 mg earlier today.  As directed.  Advised patient to hold all blood pressure medication for the time being.  Patient was given IV fluid rehydration while cancer Center today.  He was also encouraged to multiple small meals throughout the day and to push fluids.  If at all possible.   Hypoalbuminemia Albumin low at 2.6.  Patient was encouraged to push protein in his diet in the form of protein shakes or protein bars if at all possible.   Weight loss Patient continues with very poor appetite and minimal oral intake.  Patient has lost a good deal of weight recently.  Patient was encouraged to push protein in his diet and eat multiple small meals throughout the day.  Possible.   Orthostatic hypotension Lying blood pressure was 119/59, sitting blood pressure was 115/62, and standing blood pressure was 89/49 today.  Confirmed the patient did take his Norvasc 10 mg this morning as previously directed.    Most likely, orthostatic hypotension is secondary to both chemotherapy and dehydration.    Patient was advised to hold all blood pressure medication for the time being.  He was also given IV fluid rehydration while cancer Center today.  He was encouraged to push fluids is much as possible at home.  Also, patient was advised to follow-up with his primary care physician to let him know that we are currently holding all blood pressure medication due to orthostatic hypotension.   Antineoplastic chemotherapy induced anemia Chemotherapy therapy-induced anemia; hemoglobin 9.6  .  Will continue to monitor closely.   Cancer of upper lobe of right lung  Patient received his last cycle of carboplatin/Alimta chemotherapy on 04/07/2015.  Patient is scheduled to return for his last and final chemotherapy on 05/06/2015.  Will  most likely need to consider reducing the dose of his chemotherapy due to poor tolerability with this last cycle.    Patient stated understanding of all instructions; and was in agreement with this plan of care. The patient knows to call the clinic with any problems, questions or concerns.   This was a shared visit with Dr. Julien Nordmann today.  Total time spent with patient was 25 minutes;  with greater than 75 percent of that time spent in face to face counseling regarding patient's symptoms,  and coordination of care and follow up.  Disclaimer: This note was dictated with voice recognition software. Similar sounding words can inadvertently be transcribed and may not be corrected upon review.   Drue Second, NP 04/28/2015   ADDENDUM: Hematology/Oncology Attending: I had a face to face encounter with the patient. I recommended his care plan. This is a very pleasant 80 years old white male with a stage IV non-small cell lung cancer who is currently undergoing systemic chemotherapy was carboplatin and Alimta status post 5 cycles. The patient is tolerating the treatment well except for increasing fatigue and weakness as well as lack of appetite with the last 2 cycles of his chemotherapy. He did not have enough hydration over the last few days. Is feeling weak and fatigue and presented to the symptom management clinic for evaluation. We will arrange for the patient to receive 1 L of normal saline with IV Zofran at the chemotherapy treatment area today. He would come back for follow-up visit next week as a scheduled for evaluation before starting cycle #6. I may consider reducing his dose of carboplatin and Alimta with cycle #6 because of the intolerance. He was advised to call immediately if he has any concerning symptoms in the interval.  Disclaimer: This note was dictated with voice recognition software. Similar sounding words can inadvertently be transcribed and may be missed upon  review. Eilleen Kempf., MD 04/28/2015

## 2015-04-28 NOTE — Assessment & Plan Note (Signed)
Albumin low at 2.6.  Patient was encouraged to push protein in his diet in the form of protein shakes or protein bars if at all possible.

## 2015-04-28 NOTE — Assessment & Plan Note (Signed)
Patient received his last cycle of carboplatin/Alimta chemotherapy on 04/07/2015.  Patient is scheduled to return for his last and final chemotherapy on 05/06/2015.  Will most likely need to consider reducing the dose of his chemotherapy due to poor tolerability with this last cycle.

## 2015-04-28 NOTE — Assessment & Plan Note (Signed)
Patient continues with very poor appetite and minimal oral intake.  Patient has lost a good deal of weight recently.  Patient was encouraged to push protein in his diet and eat multiple small meals throughout the day.  Possible.

## 2015-04-28 NOTE — Assessment & Plan Note (Signed)
Patient reports chronic loss of appetite; he continues to lose weight.  Most likely, this is a chemotherapy side effect.  Patient was encouraged to eat multiple small meals throughout the day; and to continue pushing fluids.

## 2015-05-05 ENCOUNTER — Ambulatory Visit: Payer: Medicare Other

## 2015-05-05 ENCOUNTER — Other Ambulatory Visit: Payer: Medicare Other

## 2015-05-05 ENCOUNTER — Telehealth: Payer: Self-pay | Admitting: Internal Medicine

## 2015-05-05 NOTE — Telephone Encounter (Signed)
s.w pt wife and confirmed appts

## 2015-05-06 ENCOUNTER — Encounter: Payer: Self-pay | Admitting: Physician Assistant

## 2015-05-06 ENCOUNTER — Other Ambulatory Visit (HOSPITAL_BASED_OUTPATIENT_CLINIC_OR_DEPARTMENT_OTHER): Payer: Medicare Other

## 2015-05-06 ENCOUNTER — Ambulatory Visit (HOSPITAL_BASED_OUTPATIENT_CLINIC_OR_DEPARTMENT_OTHER): Payer: Medicare Other

## 2015-05-06 ENCOUNTER — Ambulatory Visit (HOSPITAL_BASED_OUTPATIENT_CLINIC_OR_DEPARTMENT_OTHER): Payer: Medicare Other | Admitting: Physician Assistant

## 2015-05-06 ENCOUNTER — Telehealth: Payer: Self-pay | Admitting: Internal Medicine

## 2015-05-06 VITALS — BP 140/78 | HR 107 | Temp 97.7°F | Resp 18 | Ht 66.0 in | Wt 145.5 lb

## 2015-05-06 DIAGNOSIS — Z5111 Encounter for antineoplastic chemotherapy: Secondary | ICD-10-CM

## 2015-05-06 DIAGNOSIS — C3411 Malignant neoplasm of upper lobe, right bronchus or lung: Secondary | ICD-10-CM

## 2015-05-06 DIAGNOSIS — C7951 Secondary malignant neoplasm of bone: Secondary | ICD-10-CM | POA: Diagnosis not present

## 2015-05-06 DIAGNOSIS — K5900 Constipation, unspecified: Secondary | ICD-10-CM

## 2015-05-06 LAB — COMPREHENSIVE METABOLIC PANEL (CC13)
ALT: 17 U/L (ref 0–55)
AST: 18 U/L (ref 5–34)
Albumin: 2.5 g/dL — ABNORMAL LOW (ref 3.5–5.0)
Alkaline Phosphatase: 103 U/L (ref 40–150)
Anion Gap: 14 mEq/L — ABNORMAL HIGH (ref 3–11)
BILIRUBIN TOTAL: 0.27 mg/dL (ref 0.20–1.20)
BUN: 8.1 mg/dL (ref 7.0–26.0)
CALCIUM: 9 mg/dL (ref 8.4–10.4)
CHLORIDE: 98 meq/L (ref 98–109)
CO2: 28 mEq/L (ref 22–29)
CREATININE: 0.9 mg/dL (ref 0.7–1.3)
EGFR: 81 mL/min/{1.73_m2} — ABNORMAL LOW (ref >90)
Glucose: 230 mg/dl — ABNORMAL HIGH (ref 70–140)
Potassium: 3.3 mEq/L — ABNORMAL LOW (ref 3.5–5.1)
Sodium: 139 mEq/L (ref 136–145)
Total Protein: 7.2 g/dL (ref 6.4–8.3)

## 2015-05-06 LAB — CBC WITH DIFFERENTIAL/PLATELET
BASO%: 1 % (ref 0.0–2.0)
BASOS ABS: 0 10*3/uL (ref 0.0–0.1)
EOS ABS: 0 10*3/uL (ref 0.0–0.5)
EOS%: 0.8 % (ref 0.0–7.0)
HCT: 31.7 % — ABNORMAL LOW (ref 38.4–49.9)
HEMOGLOBIN: 10.5 g/dL — AB (ref 13.0–17.1)
LYMPH%: 24.4 % (ref 14.0–49.0)
MCH: 33.9 pg — ABNORMAL HIGH (ref 27.2–33.4)
MCHC: 33 g/dL (ref 32.0–36.0)
MCV: 102.7 fL — AB (ref 79.3–98.0)
MONO#: 1 10*3/uL — ABNORMAL HIGH (ref 0.1–0.9)
MONO%: 21.5 % — ABNORMAL HIGH (ref 0.0–14.0)
NEUT%: 52.3 % (ref 39.0–75.0)
NEUTROS ABS: 2.5 10*3/uL (ref 1.5–6.5)
Platelets: 414 10*3/uL — ABNORMAL HIGH (ref 140–400)
RBC: 3.09 10*6/uL — ABNORMAL LOW (ref 4.20–5.82)
RDW: 16.6 % — ABNORMAL HIGH (ref 11.0–14.6)
WBC: 4.8 10*3/uL (ref 4.0–10.3)
lymph#: 1.2 10*3/uL (ref 0.9–3.3)

## 2015-05-06 MED ORDER — SODIUM CHLORIDE 0.9 % IV SOLN
364.8000 mg | Freq: Once | INTRAVENOUS | Status: AC
  Administered 2015-05-06: 360 mg via INTRAVENOUS
  Filled 2015-05-06: qty 36

## 2015-05-06 MED ORDER — CYANOCOBALAMIN 1000 MCG/ML IJ SOLN
1000.0000 ug | Freq: Once | INTRAMUSCULAR | Status: AC
  Administered 2015-05-06: 1000 ug via INTRAMUSCULAR

## 2015-05-06 MED ORDER — HEPARIN SOD (PORK) LOCK FLUSH 100 UNIT/ML IV SOLN
500.0000 [IU] | Freq: Once | INTRAVENOUS | Status: DC | PRN
  Filled 2015-05-06: qty 5

## 2015-05-06 MED ORDER — CYANOCOBALAMIN 1000 MCG/ML IJ SOLN
INTRAMUSCULAR | Status: AC
  Filled 2015-05-06: qty 1

## 2015-05-06 MED ORDER — SODIUM CHLORIDE 0.9 % IV SOLN
Freq: Once | INTRAVENOUS | Status: AC
  Administered 2015-05-06: 13:00:00 via INTRAVENOUS
  Filled 2015-05-06: qty 8

## 2015-05-06 MED ORDER — SODIUM CHLORIDE 0.9 % IJ SOLN
10.0000 mL | INTRAMUSCULAR | Status: DC | PRN
  Filled 2015-05-06: qty 10

## 2015-05-06 MED ORDER — SODIUM CHLORIDE 0.9 % IV SOLN
700.0000 mg | Freq: Once | INTRAVENOUS | Status: AC
  Administered 2015-05-06: 700 mg via INTRAVENOUS
  Filled 2015-05-06: qty 28

## 2015-05-06 MED ORDER — SODIUM CHLORIDE 0.9 % IV SOLN
Freq: Once | INTRAVENOUS | Status: AC
  Administered 2015-05-06: 13:00:00 via INTRAVENOUS

## 2015-05-06 NOTE — Patient Instructions (Signed)
[facility] Discharge Instructions for Patients Receiving Chemotherapy  Today you received the following chemotherapy agents carboplatin, alimta  To help prevent nausea and vomiting after your treatment, we encourage you to take your nausea medication as prescribed   If you develop nausea and vomiting that is not controlled by your nausea medication, call the clinic.   BELOW ARE SYMPTOMS THAT SHOULD BE REPORTED IMMEDIATELY:  *FEVER GREATER THAN 100.5 F  *CHILLS WITH OR WITHOUT FEVER  NAUSEA AND VOMITING THAT IS NOT CONTROLLED WITH YOUR NAUSEA MEDICATION  *UNUSUAL SHORTNESS OF BREATH  *UNUSUAL BRUISING OR BLEEDING  TENDERNESS IN MOUTH AND THROAT WITH OR WITHOUT PRESENCE OF ULCERS  *URINARY PROBLEMS  *BOWEL PROBLEMS  UNUSUAL RASH Items with * indicate a potential emergency and should be followed up as soon as possible.  Feel free to call the clinic you have any questions or concerns. The clinic phone number is [phone].  Please show the [facility] at check-in to the Emergency Department and triage nurse.

## 2015-05-06 NOTE — Telephone Encounter (Signed)
Gave patient/wife avs report and appointments for June. [facility] will call re ct - patient/wife aware.

## 2015-05-06 NOTE — Progress Notes (Signed)
[facility] Telephone:([phone]) [phone]   Fax:([phone]) [place], MD [address] [place] [address]  DIAGNOSIS: Stage IV (T3, N3, M1b) non-small cell lung cancer, adenocarcinoma diagnosed in January 2016 presented with right upper lobe lung mass in addition to mediastinal lymphadenopathy and large right pleural effusion as well as bone metastasis.  PRIOR THERAPY: Status post Pleurx catheter placement by Dr. Servando Snare for drainage of the right pleural effusion  CURRENT THERAPY: Systemic chemotherapy with carboplatin for AUC of 5 and Alimta 500 MG/M2 every 3 weeks. Status post 4 cycles.  INTERVAL HISTORY: Omar Peters 80 y.o. male returns to the clinic today for follow-up visit accompanied by his wife. He is due for cycle 4 of treatment today. Overall he is tolerating treatments well. His appetite remains decreased and he reports increased weakness as well as continued weight loss. Complains of nausea and constipation. He reports that he is having a bowel movement approximately once every 5 days. He has tried several over-the-counter preparations for constipation without significant relief. He denies fevers, chills, nausea, or vomiting. He has some shortness of breath with exertion but denies cough or hemoptysis. He has no pain. His Pleurx catheter has been removed.   MEDICAL HISTORY: Past Medical History  Diagnosis Date  . Hypertension   . Cancer     Lung  . GERD (gastroesophageal reflux disease)   . Shortness of breath dyspnea     01/14/15 at night and has to sit up  . Anxiety     due to diagnosis of Stage 4 Lung Cancer  . Macular degeneration of right eye     ALLERGIES:  is allergic to marinol.  MEDICATIONS:  Current Outpatient Prescriptions  Medication Sig Dispense Refill  . amitriptyline (ELAVIL) 10 MG tablet Take 10 mg by mouth at bedtime.    Marland Kitchen aspirin 81 MG tablet Take 81 mg by mouth daily.    . folic acid (FOLVITE) 1 MG  tablet Take 1 tablet (1 mg total) by mouth daily. 90 tablet 0  . metFORMIN (GLUCOPHAGE-XR) 500 MG 24 hr tablet Take 500 mg by mouth daily with breakfast.    . prochlorperazine (COMPAZINE) 10 MG tablet Take 1 tablet (10 mg total) by mouth every 6 (six) hours as needed for nausea or vomiting. 30 tablet 0  . ranitidine (ZANTAC) 150 MG tablet Take 150 mg by mouth daily.    Marland Kitchen senna (SENOKOT) 8.6 MG TABS tablet Take 1 tablet by mouth at bedtime.    Marland Kitchen HYDROcodone-acetaminophen (NORCO) 5-325 MG per tablet Take 1 tablet by mouth every 6 (six) hours as needed for moderate pain. (Patient not taking: Reported on 05/06/2015) 30 tablet 0   No current facility-administered medications for this visit.   Facility-Administered Medications Ordered in Other Visits  Medication Dose Route Frequency Provider Last Rate Last Dose  . heparin lock flush 100 unit/mL  500 Units Intracatheter Once PRN Curt Bears, MD      . sodium chloride 0.9 % injection 10 mL  10 mL Intracatheter PRN Curt Bears, MD        SURGICAL HISTORY:  Past Surgical History  Procedure Laterality Date  . Vein ligation and stripping  1977  . Chest tube insertion Right 01/15/2015    Procedure: INSERTION PLEURAL DRAINAGE CATHETER WITH ULTRASOUND AND FLURO GUIDANCE;  Surgeon: Grace Isaac, MD;  Location: [facility];  Service: Thoracic;  Laterality: Right;  . Removal of pleural drainage  catheter Right 03/26/2015    Procedure: REMOVAL OF PLEURAL DRAINAGE CATHETER;  Surgeon: Grace Isaac, MD;  Location: [facility];  Service: Thoracic;  Laterality: Right;    REVIEW OF SYSTEMS:  A comprehensive review of systems was negative except for: Constitutional: positive for malaise and Generalized weakness Respiratory: positive for dyspnea on exertion Gastrointestinal: positive for constipation and lack of appetite.    PHYSICAL EXAMINATION: General appearance: alert, cooperative and no distress Head: Normocephalic, without obvious abnormality,  atraumatic Neck: no adenopathy, no JVD, supple, symmetrical, trachea midline and thyroid not enlarged, symmetric, no tenderness/mass/nodules Lymph nodes: Cervical, supraclavicular, and axillary nodes normal. Resp: clear to auscultation bilaterally Back: symmetric, no curvature. ROM normal. No CVA tenderness. Cardio: regular rate and rhythm, S1, S2 normal, no murmur, click, rub or gallop GI: soft, non-tender; bowel sounds normal; no masses,  no organomegaly Extremities: extremities normal, atraumatic, no cyanosis or edema  ECOG PERFORMANCE STATUS: 1 - Symptomatic but completely ambulatory  Blood pressure 140/78, pulse 107, temperature 97.7 F (36.5 C), temperature source Oral, resp. rate 18, height '5\' 6"'$  (1.676 m), weight 145 lb 8 oz (65.998 kg), SpO2 99 %.  LABORATORY DATA: Lab Results  Component Value Date   WBC 4.8 05/06/2015   HGB 10.5* 05/06/2015   HCT 31.7* 05/06/2015   MCV 102.7* 05/06/2015   PLT 414* 05/06/2015      Chemistry      Component Value Date/Time   NA 139 05/06/2015 1051   NA 138 01/15/2015 0608   K 3.3* 05/06/2015 1051   K 3.5 01/15/2015 0608   CL 97 01/15/2015 0608   CO2 28 05/06/2015 1051   CO2 34* 01/15/2015 0608   BUN 8.1 05/06/2015 1051   BUN 13 01/15/2015 0608   CREATININE 0.9 05/06/2015 1051   CREATININE 0.96 01/15/2015 0608   GLU 231* 03/10/2015 1539      Component Value Date/Time   CALCIUM 9.0 05/06/2015 1051   CALCIUM 9.4 01/15/2015 0608   ALKPHOS 103 05/06/2015 1051   ALKPHOS 102 01/15/2015 0608   AST 18 05/06/2015 1051   AST 42* 01/15/2015 0608   ALT 17 05/06/2015 1051   ALT 38 01/15/2015 0608   BILITOT 0.27 05/06/2015 1051   BILITOT 0.8 01/15/2015 0608       RADIOGRAPHIC STUDIES: Dg Chest 2 View  04/12/2015   CLINICAL DATA:  Lung cancer.  Pleural effusion.  EXAM: CHEST  2 VIEW  COMPARISON:  CT 03/23/2016.  Chest x-ray 01/16/2016.  FINDINGS: Interval removal of right PleurX catheter. Mediastinum hilar structures are stable.  Persist atelectasis and consolidation right upper lobe. Stable right pleural effusion. Heart size stable. No acute bony abnormality .  IMPRESSION: 1. Interim removal right PleurX catheter. 2. Persistent atelectasis and consolidation right upper lobe. Persistent right pleural effusion and pleural thickening.   Electronically Signed   By: Marcello Moores  Register   On: 04/12/2015 13:24   Mr Jeri Cos Wo Contrast  04/07/2015   CLINICAL DATA:  Right upper lobe lung cancer. Altered level of consciousness. Mental status changes.  EXAM: MRI HEAD WITHOUT AND WITH CONTRAST  TECHNIQUE: Multiplanar, multiecho pulse sequences of the brain and surrounding structures were obtained without and with intravenous contrast.  CONTRAST:  53m MULTIHANCE GADOBENATE DIMEGLUMINE 529 MG/ML IV SOLN  COMPARISON:  MRI brain 01/11/2015  FINDINGS: Moderate atrophy and diffuse white matter disease is similar to the prior exam. The postcontrast images demonstrate no pathologic enhancement to suggest metastatic disease of the brain or meninges.  No acute infarct  or hemorrhage is present.  The ventricles are proportionate to the degree of atrophy. Insert pass fluid  Flow is present in the major intracranial arteries. A right lens replacement is noted. The globes and orbits are otherwise intact. The paranasal sinuses and mastoid air cells are clear.  IMPRESSION: 1. Stable atrophy and white matter disease. This likely reflects the sequela of chronic microvascular ischemia. 2. No pathologic enhancement or evidence for metastatic disease to the brain.   Electronically Signed   By: San Morelle M.D.   On: 04/07/2015 16:18    ASSESSMENT AND PLAN: This is a very pleasant 80 years old white male recently diagnosed with a stage IV non-small cell lung cancer, adenocarcinoma and currently undergoing systemic chemotherapy with carboplatin and Alimta status post 5 cycles.  Overall Mr. Straka is tolerating his chemotherapy relatively well . He is again  encouraged to increase his by mouth intake of food and fluids. The patient was discussed with and also seen by Dr. Julien Nordmann. His last restaging CT scan showed improvement in his disease. He will proceed with cycle #5 today as scheduled. Chris constipation patient was advised to drink a bottle of magnesium citrate. Once he is "cleaned out" he is to take merely asked the morning and 2 Senokot S before bedtime. Patient and his wife voiced understanding. He'll proceed with cycle #6 today except with a dose reduction with the carboplatin for an AUC of 4 and Alimta at 400 mg/m. He will continue with weekly labs as previously scheduled. He'll follow-up in 3 weeks with a restaging CT scan of his chest, abdomen and pelvis with contrast to reevaluate his disease. He returns in 3 weeks pending the outcome of the restaging CT scan further treatment options including taking a break versus maintenance chemotherapy will be discussed.   He understands and agrees with this plan. He has been encouraged to call with any issues that might arise before his next visit here.   Carlton Adam, PA-C 05/06/2015   ADDENDUM: Hematology/Oncology Attending: I had a face to face encounter with the patient. I recommended his care plan. This is a very pleasant 80 years old white male with metastatic non-small cell lung cancer, currently undergoing systemic chemotherapy with carboplatin and Alimta status post 5 cycles. The patient is tolerating his treatment well except for increasing fatigue recently. I recommended for him to proceed with cycle #6 today as a scheduled. I will reduce the dose of his carboplatin to AUC of 4 and Alimta for 100 MG/M2. The patient would come back for follow-up visit in 3 weeks for reevaluation after repeating CT scan of the chest, abdomen and pelvis for restaging of his disease. He was advised to call immediately if he has any concerning symptoms in the interval.  Disclaimer: This note was dictated with  voice recognition software. Similar sounding words can inadvertently be transcribed and may be missed upon review. Eilleen Kempf., MD 05/10/2015

## 2015-05-10 NOTE — Patient Instructions (Signed)
Drink the bottle of magnesium citrate as instructed and then begin merely asked the morning and 2 Senokot-S at bedtime to deal with her constipation Continue weekly labs as previously scheduled Follow-up in 3 weeks with a restaging CT scan of your chest, abdomen and pelvis to reevaluate your disease

## 2015-05-24 ENCOUNTER — Ambulatory Visit (HOSPITAL_COMMUNITY)
Admission: RE | Admit: 2015-05-24 | Discharge: 2015-05-24 | Disposition: A | Discharge status: Home / Self Care | Payer: Medicare Other | Source: Ambulatory Visit | Attending: Physician Assistant | Admitting: Physician Assistant

## 2015-05-24 ENCOUNTER — Encounter (HOSPITAL_COMMUNITY): Payer: Self-pay

## 2015-05-24 DIAGNOSIS — C7951 Secondary malignant neoplasm of bone: Secondary | ICD-10-CM | POA: Insufficient documentation

## 2015-05-24 DIAGNOSIS — R110 Nausea: Secondary | ICD-10-CM | POA: Diagnosis not present

## 2015-05-24 DIAGNOSIS — C3411 Malignant neoplasm of upper lobe, right bronchus or lung: Secondary | ICD-10-CM

## 2015-05-24 DIAGNOSIS — R05 Cough: Secondary | ICD-10-CM | POA: Diagnosis not present

## 2015-05-24 DIAGNOSIS — K7460 Unspecified cirrhosis of liver: Secondary | ICD-10-CM | POA: Insufficient documentation

## 2015-05-24 DIAGNOSIS — C3490 Malignant neoplasm of unspecified part of unspecified bronchus or lung: Secondary | ICD-10-CM | POA: Diagnosis present

## 2015-05-24 DIAGNOSIS — K5900 Constipation, unspecified: Secondary | ICD-10-CM | POA: Insufficient documentation

## 2015-05-24 DIAGNOSIS — Z87891 Personal history of nicotine dependence: Secondary | ICD-10-CM | POA: Insufficient documentation

## 2015-05-24 HISTORY — DX: Type 2 diabetes mellitus without complications: E11.9

## 2015-05-24 MED ORDER — IOHEXOL 300 MG/ML  SOLN
100.0000 mL | Freq: Once | INTRAMUSCULAR | Status: AC | PRN
Start: 2015-05-24 — End: 2015-05-24
  Administered 2015-05-24: 100 mL via INTRAVENOUS

## 2015-05-27 ENCOUNTER — Ambulatory Visit: Payer: Medicare Other

## 2015-05-27 ENCOUNTER — Ambulatory Visit (HOSPITAL_BASED_OUTPATIENT_CLINIC_OR_DEPARTMENT_OTHER): Payer: Medicare Other | Admitting: Physician Assistant

## 2015-05-27 ENCOUNTER — Encounter: Payer: Self-pay | Admitting: Physician Assistant

## 2015-05-27 ENCOUNTER — Other Ambulatory Visit (HOSPITAL_BASED_OUTPATIENT_CLINIC_OR_DEPARTMENT_OTHER): Payer: Medicare Other

## 2015-05-27 ENCOUNTER — Encounter: Payer: Self-pay | Admitting: *Deleted

## 2015-05-27 VITALS — BP 163/87 | HR 107 | Temp 97.5°F | Resp 18 | Ht 66.0 in | Wt 144.6 lb

## 2015-05-27 DIAGNOSIS — C7951 Secondary malignant neoplasm of bone: Secondary | ICD-10-CM | POA: Diagnosis not present

## 2015-05-27 DIAGNOSIS — R634 Abnormal weight loss: Secondary | ICD-10-CM

## 2015-05-27 DIAGNOSIS — K5900 Constipation, unspecified: Secondary | ICD-10-CM | POA: Diagnosis not present

## 2015-05-27 DIAGNOSIS — C3411 Malignant neoplasm of upper lobe, right bronchus or lung: Secondary | ICD-10-CM

## 2015-05-27 LAB — CBC WITH DIFFERENTIAL/PLATELET
BASO%: 0.4 % (ref 0.0–2.0)
BASOS ABS: 0 10*3/uL (ref 0.0–0.1)
EOS%: 0.8 % (ref 0.0–7.0)
Eosinophils Absolute: 0 10*3/uL (ref 0.0–0.5)
HEMATOCRIT: 30.7 % — AB (ref 38.4–49.9)
HEMOGLOBIN: 10 g/dL — AB (ref 13.0–17.1)
LYMPH%: 22.3 % (ref 14.0–49.0)
MCH: 33.6 pg — AB (ref 27.2–33.4)
MCHC: 32.6 g/dL (ref 32.0–36.0)
MCV: 103 fL — ABNORMAL HIGH (ref 79.3–98.0)
MONO#: 1.2 10*3/uL — ABNORMAL HIGH (ref 0.1–0.9)
MONO%: 25.7 % — ABNORMAL HIGH (ref 0.0–14.0)
NEUT#: 2.4 10*3/uL (ref 1.5–6.5)
NEUT%: 50.8 % (ref 39.0–75.0)
NRBC: 0 % (ref 0–0)
Platelets: 327 10*3/uL (ref 140–400)
RBC: 2.98 10*6/uL — AB (ref 4.20–5.82)
RDW: 16.1 % — ABNORMAL HIGH (ref 11.0–14.6)
WBC: 4.8 10*3/uL (ref 4.0–10.3)
lymph#: 1.1 10*3/uL (ref 0.9–3.3)

## 2015-05-27 LAB — COMPREHENSIVE METABOLIC PANEL (CC13)
ALT: 13 U/L (ref 0–55)
AST: 17 U/L (ref 5–34)
Albumin: 2.6 g/dL — ABNORMAL LOW (ref 3.5–5.0)
Alkaline Phosphatase: 99 U/L (ref 40–150)
Anion Gap: 10 mEq/L (ref 3–11)
BILIRUBIN TOTAL: 0.27 mg/dL (ref 0.20–1.20)
BUN: 8.4 mg/dL (ref 7.0–26.0)
CHLORIDE: 99 meq/L (ref 98–109)
CO2: 28 meq/L (ref 22–29)
Calcium: 9.3 mg/dL (ref 8.4–10.4)
Creatinine: 0.9 mg/dL (ref 0.7–1.3)
EGFR: 79 mL/min/{1.73_m2} — AB (ref >90)
Glucose: 223 mg/dl — ABNORMAL HIGH (ref 70–140)
Potassium: 3.4 mEq/L — ABNORMAL LOW (ref 3.5–5.1)
Sodium: 137 mEq/L (ref 136–145)
TOTAL PROTEIN: 7.2 g/dL (ref 6.4–8.3)

## 2015-05-27 NOTE — Progress Notes (Addendum)
[facility] [phone]   Fax:([phone]) [place], [place] [address] [place] [address]  DIAGNOSIS: Stage IV (T3, N3, M1b) non-small cell lung cancer, adenocarcinoma diagnosed in January 2016 presented with right upper lobe lung mass in addition to mediastinal lymphadenopathy and large right pleural effusion as well as bone metastasis.  PRIOR THERAPY: Status post Pleurx catheter placement by Dr. Servando Snare for drainage of Omar right pleural effusion  CURRENT THERAPY: Systemic chemotherapy with carboplatin for AUC of 5 and Alimta 500 MG/M2 every 3 weeks. Status post 6 cycles.  INTERVAL HISTORY: Omar Peters 80 y.o. male returns to Omar clinic today for follow-up visit accompanied by his wife. Overall he tolerated his treatments well. He did note that it took longer to recover after each subsequent cycle. He notes some increase shortness of breath with exertion of his activities of daily living. His appetite remains decreased and he reports increased weakness as well as continued weight loss. Complains of constipation.  He is currently taking Senokot S with some improvement. He also reports occasional episodes of nausea that are well managed with his current anti-medic.  He denies fevers, chills, nausea, or vomiting. He has some shortness of breath with exertion but denies cough or hemoptysis. He has no pain. His Pleurx catheter has been removed. He recently had a restaging CT scan of Omar chest, abdomen and pelvis and he presents to discuss Omar results as well as his treatment options. He is in his wife report that they have a large [place] trip to [place] in [place] 9 through 07/03/2015. There are 50-60 family members that attend this event and they have already paid their deposits.  MEDICAL HISTORY: Past Medical History  Diagnosis Date  . Hypertension   . GERD (gastroesophageal reflux disease)   . Shortness of breath dyspnea    01/14/15 at night and has to sit up  . Anxiety     due to diagnosis of Stage 4 Lung Cancer  . Macular degeneration of right eye   . Cancer     Lung  . Diabetes mellitus without complication     ALLERGIES:  is allergic to marinol.  MEDICATIONS:  Current Outpatient Prescriptions  Medication Sig Dispense Refill  . amitriptyline (ELAVIL) 10 MG tablet Take 10 mg by mouth at bedtime.    Marland Kitchen aspirin 81 MG tablet Take 81 mg by mouth daily.    . folic acid (FOLVITE) 1 MG tablet Take 1 tablet (1 mg total) by mouth daily. 90 tablet 0  . metFORMIN (GLUCOPHAGE-XR) 500 MG 24 hr tablet Take 500 mg by mouth daily with breakfast.    . prochlorperazine (COMPAZINE) 10 MG tablet Take 1 tablet (10 mg total) by mouth every 6 (six) hours as needed for nausea or vomiting. 30 tablet 0  . ranitidine (ZANTAC) 150 MG tablet Take 150 mg by mouth daily.    Marland Kitchen senna (SENOKOT) 8.6 MG TABS tablet Take 1 tablet by mouth at bedtime.    Marland Kitchen HYDROcodone-acetaminophen (NORCO) 5-325 MG per tablet Take 1 tablet by mouth every 6 (six) hours as needed for moderate pain. (Peters not taking: Reported on 05/27/2015) 30 tablet 0   No current facility-administered medications for this visit.    SURGICAL HISTORY:  Past Surgical History  Procedure Laterality Date  . Vein ligation and stripping  1977  . Chest tube insertion Right 01/15/2015    Procedure: INSERTION PLEURAL DRAINAGE CATHETER WITH  ULTRASOUND AND FLURO GUIDANCE;  Surgeon: Grace Isaac, MD;  Location: [facility];  Service: Thoracic;  Laterality: Right;  . Removal of pleural drainage catheter Right 03/26/2015    Procedure: REMOVAL OF PLEURAL DRAINAGE CATHETER;  Surgeon: Grace Isaac, MD;  Location: [facility];  Service: Thoracic;  Laterality: Right;    REVIEW OF SYSTEMS:  A comprehensive review of systems was negative except for: Constitutional: positive for malaise and Generalized weakness Respiratory: positive for dyspnea on exertion Gastrointestinal: positive for  constipation and lack of appetite.    PHYSICAL EXAMINATION: General appearance: alert, cooperative and no distress Head: Normocephalic, without obvious abnormality, atraumatic Neck: no adenopathy, no JVD, supple, symmetrical, trachea midline and thyroid not enlarged, symmetric, no tenderness/mass/nodules Lymph nodes: Cervical, supraclavicular, and axillary nodes normal. Resp: clear to auscultation bilaterally Back: symmetric, no curvature. ROM normal. No CVA tenderness. Cardio: regular rate and rhythm, S1, S2 normal, no murmur, click, rub or gallop GI: soft, non-tender; bowel sounds normal; no masses,  no organomegaly Extremities: extremities normal, atraumatic, no cyanosis or edema  ECOG PERFORMANCE STATUS: 1 - Symptomatic but completely ambulatory  Blood pressure 163/87, pulse 107, temperature 97.5 F (36.4 C), temperature source Oral, resp. rate 18, height '5\' 6"'$  (1.676 m), weight 144 lb 9.6 oz (65.59 kg), SpO2 98 %.  LABORATORY DATA: Lab Results  Component Value Date   WBC 4.8 05/27/2015   HGB 10.0* 05/27/2015   HCT 30.7* 05/27/2015   MCV 103.0* 05/27/2015   PLT 327 05/27/2015      Chemistry      Component Value Date/Time   NA 137 05/27/2015 1112   NA 138 01/15/2015 0608   K 3.4* 05/27/2015 1112   K 3.5 01/15/2015 0608   CL 97 01/15/2015 0608   CO2 28 05/27/2015 1112   CO2 34* 01/15/2015 0608   BUN 8.4 05/27/2015 1112   BUN 13 01/15/2015 0608   CREATININE 0.9 05/27/2015 1112   CREATININE 0.96 01/15/2015 0608   GLU 231* 03/10/2015 1539      Component Value Date/Time   CALCIUM 9.3 05/27/2015 1112   CALCIUM 9.4 01/15/2015 0608   ALKPHOS 99 05/27/2015 1112   ALKPHOS 102 01/15/2015 0608   AST 17 05/27/2015 1112   AST 42* 01/15/2015 0608   ALT 13 05/27/2015 1112   ALT 38 01/15/2015 0608   BILITOT 0.27 05/27/2015 1112   BILITOT 0.8 01/15/2015 0608       RADIOGRAPHIC STUDIES: Ct Chest W Contrast  05/24/2015   CLINICAL DATA:  Lung cancer diagnosed 1/16.  Chemotherapy in progress. Cough. Ex-smoker. Nausea. Constipation. Bone metastasis. Restaging.  EXAM: CT CHEST, ABDOMEN, AND PELVIS WITH CONTRAST  TECHNIQUE: Multidetector CT imaging of Omar chest, abdomen and pelvis was performed following Omar standard protocol during bolus administration of intravenous contrast.  CONTRAST:  14m OMNIPAQUE IOHEXOL 300 MG/ML  SOLN  COMPARISON:  03/24/2015.  FINDINGS: CT CHEST FINDINGS  Mediastinum/Nodes: No supraclavicular adenopathy. Subcentimeter low-density left thyroid nodule is unchanged and of doubtful clinical significance.  Aortic and branch vessel atherosclerosis. Normal heart size, without pericardial effusion. Multivessel coronary artery atherosclerosis. No central pulmonary embolism, on this non-dedicated study. Right paratracheal node measures 9 mm on image 23 and is similar to on Omar prior exam (when remeasured). An 8 mm subcarinal node is similar. No hilar adenopathy. 11 mm AP window node is not significantly changed.  6 mm high right paratracheal node on image 13 is decreased in size from 9 mm on Omar prior.  Lungs/Pleura: Right-sided pleural thickening  with trace right pleural fluid, similar. Removal of right-sided pleural drain. There is trace right apical pleural air on image 13, of indeterminate etiology.  Secretions along Omar right-sided Omar trachea. Mild centrilobular emphysema. Right-sided septal thickening which is likely radiation induced. Clear left lung. This right apical lung mass is difficult to differentiate from surrounding radiation change. 6.1 x 4.2 cm on image 19. Decreased from 6.3 x 4.6 cm on Omar prior exam.  Musculoskeletal: Similar sclerotic lesion within Omar T11 vertebral body. Make sclerosis and increased density within Omar right scapula is similar.  CT ABDOMEN AND PELVIS FINDINGS  Hepatobiliary: Mild to moderate cirrhosis, without focal liver lesion. No calcified gallstones. Mucosal hyper enhancement within Omar gallbladder is increased and  nonspecific. Common duct dilatation at 1.3 cm is not significantly changed, including back to 12/16/2014. No intrahepatic ductal dilatation. No choledocholithiasis.  Pancreas: Pancreatic atrophy is mild. No duct dilatation or acute inflammation.  Spleen: Normal  Adrenals/Urinary Tract: Normal adrenal glands. Too small to characterize right renal lesions. Normal left kidney. Normal urinary bladder.  Stomach/Bowel: Normal stomach, without wall thickening. Normal terminal ileum and appendix. Normal small bowel.  Vascular/Lymphatic: Advanced aortic and branch vessel atherosclerosis. No abdominopelvic adenopathy.  Reproductive: Moderate prostatomegaly. Tiny fat containing right inguinal hernia.  Other: No significant free fluid.  Musculoskeletal: Right iliac lytic lesion with cortical destruction and healing pathologic fracture. Grossly similar to on Omar prior. There may be slight interval increase in surrounding sclerosis indicative of healing. Subcentimeter left sacral lesion is not significantly changed.  IMPRESSION: 1. Slight decrease in size of a right upper lobe lung mass. This is difficult to differentiate from surrounding radiation change. 2. Similar to decreased size of thoracic nodes. 3. Similar osseous metastasis.  No new sites of disease identified. 4. Similar small right pleural effusion and pleural thickening. Interval removal of pleural drain with trace right apical pleural air of indeterminate etiology. 5.  Atherosclerosis, including within Omar coronary arteries. 6. Cirrhosis without focal liver lesion. 7. Chronic common duct dilatation, without cause identified. Correlate with bilirubin levels. 8. Nonspecific mucosal hyper enhancement involving Omar gallbladder, increased.   Electronically Signed   By: Abigail Miyamoto M.D.   On: 05/24/2015 14:26   Ct Abdomen Pelvis W Contrast  05/24/2015   CLINICAL DATA:  Lung cancer diagnosed 1/16. Chemotherapy in progress. Cough. Ex-smoker. Nausea. Constipation. Bone  metastasis. Restaging.  EXAM: CT CHEST, ABDOMEN, AND PELVIS WITH CONTRAST  TECHNIQUE: Multidetector CT imaging of Omar chest, abdomen and pelvis was performed following Omar standard protocol during bolus administration of intravenous contrast.  CONTRAST:  147m OMNIPAQUE IOHEXOL 300 MG/ML  SOLN  COMPARISON:  03/24/2015.  FINDINGS: CT CHEST FINDINGS  Mediastinum/Nodes: No supraclavicular adenopathy. Subcentimeter low-density left thyroid nodule is unchanged and of doubtful clinical significance.  Aortic and branch vessel atherosclerosis. Normal heart size, without pericardial effusion. Multivessel coronary artery atherosclerosis. No central pulmonary embolism, on this non-dedicated study. Right paratracheal node measures 9 mm on image 23 and is similar to on Omar prior exam (when remeasured). An 8 mm subcarinal node is similar. No hilar adenopathy. 11 mm AP window node is not significantly changed.  6 mm high right paratracheal node on image 13 is decreased in size from 9 mm on Omar prior.  Lungs/Pleura: Right-sided pleural thickening with trace right pleural fluid, similar. Removal of right-sided pleural drain. There is trace right apical pleural air on image 13, of indeterminate etiology.  Secretions along Omar right-sided Omar trachea. Mild centrilobular emphysema. Right-sided septal thickening which is  likely radiation induced. Clear left lung. This right apical lung mass is difficult to differentiate from surrounding radiation change. 6.1 x 4.2 cm on image 19. Decreased from 6.3 x 4.6 cm on Omar prior exam.  Musculoskeletal: Similar sclerotic lesion within Omar T11 vertebral body. Make sclerosis and increased density within Omar right scapula is similar.  CT ABDOMEN AND PELVIS FINDINGS  Hepatobiliary: Mild to moderate cirrhosis, without focal liver lesion. No calcified gallstones. Mucosal hyper enhancement within Omar gallbladder is increased and nonspecific. Common duct dilatation at 1.3 cm is not significantly changed,  including back to 12/16/2014. No intrahepatic ductal dilatation. No choledocholithiasis.  Pancreas: Pancreatic atrophy is mild. No duct dilatation or acute inflammation.  Spleen: Normal  Adrenals/Urinary Tract: Normal adrenal glands. Too small to characterize right renal lesions. Normal left kidney. Normal urinary bladder.  Stomach/Bowel: Normal stomach, without wall thickening. Normal terminal ileum and appendix. Normal small bowel.  Vascular/Lymphatic: Advanced aortic and branch vessel atherosclerosis. No abdominopelvic adenopathy.  Reproductive: Moderate prostatomegaly. Tiny fat containing right inguinal hernia.  Other: No significant free fluid.  Musculoskeletal: Right iliac lytic lesion with cortical destruction and healing pathologic fracture. Grossly similar to on Omar prior. There may be slight interval increase in surrounding sclerosis indicative of healing. Subcentimeter left sacral lesion is not significantly changed.  IMPRESSION: 1. Slight decrease in size of a right upper lobe lung mass. This is difficult to differentiate from surrounding radiation change. 2. Similar to decreased size of thoracic nodes. 3. Similar osseous metastasis.  No new sites of disease identified. 4. Similar small right pleural effusion and pleural thickening. Interval removal of pleural drain with trace right apical pleural air of indeterminate etiology. 5.  Atherosclerosis, including within Omar coronary arteries. 6. Cirrhosis without focal liver lesion. 7. Chronic common duct dilatation, without cause identified. Correlate with bilirubin levels. 8. Nonspecific mucosal hyper enhancement involving Omar gallbladder, increased.   Electronically Signed   By: Abigail Miyamoto M.D.   On: 05/24/2015 14:26    ASSESSMENT AND PLAN: This is a very pleasant 80 years old white male recently diagnosed with a stage IV non-small cell lung cancer, adenocarcinoma and currently undergoing systemic chemotherapy with carboplatin and Alimta status post 6  cycles.  Overall Omar Peters tolerated his chemotherapy relatively well. He is again encouraged to increase his by mouth intake of food and fluids. Omar Peters was discussed with and also seen by Dr. Julien Nordmann. His recent restaging CT scan showed slight decrease in Omar size of Omar right upper lobe lung mass and overall stable disease. Omar Peters was given several options regarding treatment including: Observation, maintenance chemotherapy, or participation and clinical trial,specifically BMS Checkmate 370. Omar Peters was interested in purchase abating in Omar clinical trial and went for a clinical research nurses discuss Omar protocol and consent form process with him. If he is eligible he will be randomized and will start treatment according to that protocol. Should he not be eligible, he will proceed with maintenance Alimta.   He understands and agrees with this plan. He has been encouraged to call with any issues that might arise before his next visit here.   Carlton Adam, PA-C 05/27/2015   ADDENDUM: Hematology/Oncology Attending: I had a face to face encounter with Omar Peters. I recommended his care plan. This is a very pleasant 80 years old white male with metastatic non-small cell lung cancer, adenocarcinoma status post induction chemotherapy with carboplatin and Alimta for 6 cycles with no evidence for disease progression after cycle #6. I  discussed Omar recent CT scan with Omar Peters and his wife. I gave him several options for treatment of his condition including observation, maintenance chemotherapy with single agent Alimta versus enrollment in Omar BMS Checkmate 370 clinical trial where Omar Peters would be randomized to maintenance treatment with either Alimta, Nivolumab or a combination of Alimta and Nivolumab. Omar Peters and his wife are interested in Omar clinical trial. He will be seen by Omar clinical research nurse later today for evaluation. We will arrange follow-up visit for Omar  Peters based on his eligibility for Omar clinical trial. Omar Peters was advised to call immediately if he has any concerning symptoms in Omar interval.  Disclaimer: This note was dictated with voice recognition software. Similar sounding words can inadvertently be transcribed and may be missed upon review. Eilleen Kempf., MD 05/29/2015

## 2015-05-28 NOTE — Patient Instructions (Signed)
Your recent restaging CT scan showed interval improvement and overall stable disease You have elected to purchase a paid in the clinical research trial and follow-up according to the protocol if you're eligible. If you are not eligible for the protocol you'll proceed to maintenance chemotherapy with single agent Alimta

## 2015-06-03 ENCOUNTER — Ambulatory Visit: Payer: Medicare Other

## 2015-06-03 ENCOUNTER — Other Ambulatory Visit: Payer: Self-pay

## 2015-06-03 ENCOUNTER — Other Ambulatory Visit (HOSPITAL_BASED_OUTPATIENT_CLINIC_OR_DEPARTMENT_OTHER): Payer: Medicare Other

## 2015-06-03 ENCOUNTER — Other Ambulatory Visit: Payer: Self-pay | Admitting: Medical Oncology

## 2015-06-03 ENCOUNTER — Encounter: Payer: Medicare Other | Admitting: Medical Oncology

## 2015-06-03 DIAGNOSIS — C7951 Secondary malignant neoplasm of bone: Secondary | ICD-10-CM | POA: Diagnosis not present

## 2015-06-03 DIAGNOSIS — C3411 Malignant neoplasm of upper lobe, right bronchus or lung: Secondary | ICD-10-CM | POA: Diagnosis not present

## 2015-06-03 DIAGNOSIS — Z79899 Other long term (current) drug therapy: Secondary | ICD-10-CM

## 2015-06-03 LAB — T3, FREE: T3 FREE: 2.5 pg/mL (ref 2.3–4.2)

## 2015-06-03 LAB — COMPREHENSIVE METABOLIC PANEL (CC13)
ALK PHOS: 98 U/L (ref 40–150)
ALT: 14 U/L (ref 0–55)
AST: 17 U/L (ref 5–34)
Albumin: 2.7 g/dL — ABNORMAL LOW (ref 3.5–5.0)
Anion Gap: 8 mEq/L (ref 3–11)
BILIRUBIN TOTAL: 0.26 mg/dL (ref 0.20–1.20)
BUN: 8 mg/dL (ref 7.0–26.0)
CHLORIDE: 99 meq/L (ref 98–109)
CO2: 32 meq/L — AB (ref 22–29)
Calcium: 9.7 mg/dL (ref 8.4–10.4)
Creatinine: 0.9 mg/dL (ref 0.7–1.3)
EGFR: 81 mL/min/{1.73_m2} — ABNORMAL LOW (ref >90)
Glucose: 142 mg/dl — ABNORMAL HIGH (ref 70–140)
Potassium: 4 mEq/L (ref 3.5–5.1)
Sodium: 139 mEq/L (ref 136–145)
TOTAL PROTEIN: 7.5 g/dL (ref 6.4–8.3)

## 2015-06-03 LAB — LACTATE DEHYDROGENASE (CC13): LDH: 159 U/L (ref 125–245)

## 2015-06-03 LAB — HEPATITIS C ANTIBODY: HCV AB: NEGATIVE

## 2015-06-03 LAB — MAGNESIUM (CC13): Magnesium: 2.3 mg/dl (ref 1.5–2.5)

## 2015-06-03 LAB — CBC WITH DIFFERENTIAL/PLATELET
BASO%: 0.3 % (ref 0.0–2.0)
Basophils Absolute: 0 10*3/uL (ref 0.0–0.1)
EOS%: 0.4 % (ref 0.0–7.0)
Eosinophils Absolute: 0 10*3/uL (ref 0.0–0.5)
HEMATOCRIT: 33.8 % — AB (ref 38.4–49.9)
HEMOGLOBIN: 10.9 g/dL — AB (ref 13.0–17.1)
LYMPH#: 1.8 10*3/uL (ref 0.9–3.3)
LYMPH%: 18.5 % (ref 14.0–49.0)
MCH: 34 pg — ABNORMAL HIGH (ref 27.2–33.4)
MCHC: 32.2 g/dL (ref 32.0–36.0)
MCV: 105.3 fL — ABNORMAL HIGH (ref 79.3–98.0)
MONO#: 1.1 10*3/uL — ABNORMAL HIGH (ref 0.1–0.9)
MONO%: 12 % (ref 0.0–14.0)
NEUT#: 6.5 10*3/uL (ref 1.5–6.5)
NEUT%: 68.8 % (ref 39.0–75.0)
Platelets: 262 10*3/uL (ref 140–400)
RBC: 3.21 10*6/uL — ABNORMAL LOW (ref 4.20–5.82)
RDW: 15.7 % — AB (ref 11.0–14.6)
WBC: 9.4 10*3/uL (ref 4.0–10.3)

## 2015-06-03 LAB — T4, FREE: FREE T4: 0.82 ng/dL (ref 0.80–1.80)

## 2015-06-03 LAB — PHOSPHORUS: Phosphorus: 4.2 mg/dL (ref 2.3–4.6)

## 2015-06-03 LAB — HEPATITIS B SURFACE ANTIGEN: Hepatitis B Surface Ag: NEGATIVE

## 2015-06-03 LAB — TSH CHCC: TSH: 2.733 m(IU)/L (ref 0.320–4.118)

## 2015-06-07 ENCOUNTER — Other Ambulatory Visit: Payer: Self-pay | Admitting: Medical Oncology

## 2015-06-08 ENCOUNTER — Telehealth: Payer: Self-pay | Admitting: Internal Medicine

## 2015-06-08 ENCOUNTER — Telehealth: Payer: Self-pay | Admitting: *Deleted

## 2015-06-08 NOTE — Telephone Encounter (Signed)
lvm for pt regarding to Omar Peters [facility] appt

## 2015-06-08 NOTE — Telephone Encounter (Signed)
Per staff message and POF I have scheduled appts. Advised scheduler of appts. JMW  

## 2015-06-09 ENCOUNTER — Other Ambulatory Visit: Payer: Self-pay | Admitting: Medical Oncology

## 2015-06-09 DIAGNOSIS — J984 Other disorders of lung: Secondary | ICD-10-CM

## 2015-06-09 DIAGNOSIS — C3411 Malignant neoplasm of upper lobe, right bronchus or lung: Secondary | ICD-10-CM

## 2015-06-10 ENCOUNTER — Encounter: Payer: Self-pay | Admitting: Internal Medicine

## 2015-06-10 ENCOUNTER — Encounter: Payer: Self-pay | Admitting: Medical Oncology

## 2015-06-10 ENCOUNTER — Telehealth: Payer: Self-pay | Admitting: Internal Medicine

## 2015-06-10 ENCOUNTER — Ambulatory Visit (HOSPITAL_BASED_OUTPATIENT_CLINIC_OR_DEPARTMENT_OTHER): Payer: Medicare Other

## 2015-06-10 ENCOUNTER — Other Ambulatory Visit (HOSPITAL_BASED_OUTPATIENT_CLINIC_OR_DEPARTMENT_OTHER): Payer: Medicare Other

## 2015-06-10 ENCOUNTER — Encounter: Payer: Self-pay | Admitting: Physician Assistant

## 2015-06-10 ENCOUNTER — Ambulatory Visit (HOSPITAL_BASED_OUTPATIENT_CLINIC_OR_DEPARTMENT_OTHER): Payer: Medicare Other | Admitting: Physician Assistant

## 2015-06-10 VITALS — BP 157/81 | HR 105 | Temp 98.4°F | Resp 18 | Ht 66.0 in | Wt 145.8 lb

## 2015-06-10 DIAGNOSIS — K5900 Constipation, unspecified: Secondary | ICD-10-CM

## 2015-06-10 DIAGNOSIS — C7951 Secondary malignant neoplasm of bone: Secondary | ICD-10-CM

## 2015-06-10 DIAGNOSIS — C3411 Malignant neoplasm of upper lobe, right bronchus or lung: Secondary | ICD-10-CM | POA: Diagnosis not present

## 2015-06-10 DIAGNOSIS — Z006 Encounter for examination for normal comparison and control in clinical research program: Secondary | ICD-10-CM

## 2015-06-10 DIAGNOSIS — R0602 Shortness of breath: Secondary | ICD-10-CM

## 2015-06-10 DIAGNOSIS — Z5112 Encounter for antineoplastic immunotherapy: Secondary | ICD-10-CM | POA: Diagnosis not present

## 2015-06-10 LAB — CBC WITH DIFFERENTIAL/PLATELET
BASO%: 0.1 % (ref 0.0–2.0)
BASOS ABS: 0 10*3/uL (ref 0.0–0.1)
EOS%: 1.3 % (ref 0.0–7.0)
Eosinophils Absolute: 0.1 10*3/uL (ref 0.0–0.5)
HEMATOCRIT: 32.6 % — AB (ref 38.4–49.9)
HEMOGLOBIN: 10.7 g/dL — AB (ref 13.0–17.1)
LYMPH#: 1.8 10*3/uL (ref 0.9–3.3)
LYMPH%: 21.2 % (ref 14.0–49.0)
MCH: 34 pg — ABNORMAL HIGH (ref 27.2–33.4)
MCHC: 32.9 g/dL (ref 32.0–36.0)
MCV: 103.5 fL — ABNORMAL HIGH (ref 79.3–98.0)
MONO#: 1.2 10*3/uL — ABNORMAL HIGH (ref 0.1–0.9)
MONO%: 13.6 % (ref 0.0–14.0)
NEUT#: 5.6 10*3/uL (ref 1.5–6.5)
NEUT%: 63.8 % (ref 39.0–75.0)
Platelets: 278 10*3/uL (ref 140–400)
RBC: 3.15 10*6/uL — ABNORMAL LOW (ref 4.20–5.82)
RDW: 16.3 % — ABNORMAL HIGH (ref 11.0–14.6)
WBC: 8.7 10*3/uL (ref 4.0–10.3)

## 2015-06-10 LAB — COMPREHENSIVE METABOLIC PANEL (CC13)
ALK PHOS: 99 U/L (ref 40–150)
ALT: 17 U/L (ref 0–55)
ANION GAP: 8 meq/L (ref 3–11)
AST: 21 U/L (ref 5–34)
Albumin: 2.7 g/dL — ABNORMAL LOW (ref 3.5–5.0)
BUN: 9.2 mg/dL (ref 7.0–26.0)
CHLORIDE: 100 meq/L (ref 98–109)
CO2: 29 mEq/L (ref 22–29)
Calcium: 9.2 mg/dL (ref 8.4–10.4)
Creatinine: 0.8 mg/dL (ref 0.7–1.3)
EGFR: 86 mL/min/{1.73_m2} — AB (ref >90)
GLUCOSE: 142 mg/dL — AB (ref 70–140)
POTASSIUM: 3.3 meq/L — AB (ref 3.5–5.1)
SODIUM: 138 meq/L (ref 136–145)
TOTAL PROTEIN: 7.2 g/dL (ref 6.4–8.3)
Total Bilirubin: 0.2 mg/dL (ref 0.20–1.20)

## 2015-06-10 LAB — RESEARCH LABS

## 2015-06-10 MED ORDER — SODIUM CHLORIDE 0.9 % IV SOLN
240.0000 mg | Freq: Once | INTRAVENOUS | Status: AC
  Administered 2015-06-10: 240 mg via INTRAVENOUS
  Filled 2015-06-10: qty 24

## 2015-06-10 MED ORDER — SODIUM CHLORIDE 0.9 % IV SOLN
Freq: Once | INTRAVENOUS | Status: AC
  Administered 2015-06-10: 16:00:00 via INTRAVENOUS

## 2015-06-10 NOTE — Patient Instructions (Signed)
Nivolumab injection What is this medicine? NIVOLUMAB (nye VOL ue mab) is used to treat certain types of melanoma and lung cancer. This medicine may be used for other purposes; ask your health care provider or pharmacist if you have questions. COMMON BRAND NAME(S): Opdivo What should I tell my health care provider before I take this medicine? They need to know if you have any of these conditions: -eye disease, vision problems -history of pancreatitis -immune system problems -inflammatory bowel disease -kidney disease -liver disease -lung disease -lupus -myasthenia gravis -multiple sclerosis -organ transplant -stomach or intestine problems -thyroid disease -tingling of the fingers or toes, or other nerve disorder -an unusual or allergic reaction to nivolumab, other medicines, foods, dyes, or preservatives -pregnant or trying to get pregnant -breast-feeding How should I use this medicine? This medicine is for infusion into a vein. It is given by a health care professional in a hospital or clinic setting. A special MedGuide will be given to you before each treatment. Be sure to read this information carefully each time. Talk to your pediatrician regarding the use of this medicine in children. Special care may be needed. Overdosage: If you think you've taken too much of this medicine contact a poison control center or emergency room at once. Overdosage: If you think you have taken too much of this medicine contact a poison control center or emergency room at once. NOTE: This medicine is only for you. Do not share this medicine with others. What if I miss a dose? It is important not to miss your dose. Call your doctor or health care professional if you are unable to keep an appointment. What may interact with this medicine? Interactions have not been studied. This list may not describe all possible interactions. Give your health care provider a list of all the medicines, herbs,  non-prescription drugs, or dietary supplements you use. Also tell them if you smoke, drink alcohol, or use illegal drugs. Some items may interact with your medicine. What should I watch for while using this medicine? Tell your doctor or healthcare professional if your symptoms do not start to get better or if they get worse. Your condition will be monitored carefully while you are receiving this medicine. You may need blood work done while you are taking this medicine. What side effects may I notice from receiving this medicine? Side effects that you should report to your doctor or health care professional as soon as possible: -allergic reactions like skin rash, itching or hives, swelling of the face, lips, or tongue -black, tarry stools -bloody or watery diarrhea -changes in vision -chills -cough -depressed mood -eye pain -feeling anxious -fever -general ill feeling or flu-like symptoms -hair loss -loss of appetite -low blood counts - this medicine may decrease the number of white blood cells, red blood cells and platelets. You may be at increased risk for infections and bleeding -pain, tingling, numbness in the hands or feet -redness, blistering, peeling or loosening of the skin, including inside the mouth -red pinpoint spots on skin -signs of decreased platelets or bleeding - bruising, pinpoint red spots on the skin, black, tarry stools, blood in the urine -signs of decreased red blood cells - unusually weak or tired, feeling faint or lightheaded, falls -signs of infection - fever or chills, cough, sore throat, pain or trouble passing urine -signs and symptoms of a dangerous change in heartbeat or heart rhythm like chest pain; dizziness; fast or irregular heartbeat; palpitations; feeling faint or lightheaded, falls; breathing problems -signs   and symptoms of high blood sugar such as dizziness; dry mouth; dry skin; fruity breath; nausea; stomach pain; increased hunger or thirst; increased  urination -signs and symptoms of kidney injury like trouble passing urine or change in the amount of urine -signs and symptoms of liver injury like dark yellow or brown urine; general ill feeling or flu-like symptoms; light-colored stools; loss of appetite; nausea; right upper belly pain; unusually weak or tired; yellowing of the eyes or skin -signs and symptoms of increased potassium like muscle weakness; chest pain; or fast, irregular heartbeat -signs and symptoms of low potassium like muscle cramps or muscle pain; chest pain; dizziness; feeling faint or lightheaded, falls; palpitations; breathing problems; or fast, irregular heartbeat -swelling of the ankles, feet, hands -weight gainSide effects that usually do not require medical attention (report to your doctor or health care professional if they continue or are bothersome): -constipation -general ill feeling or flu-like symptoms -hair loss -loss of appetite -nausea, vomiting This list may not describe all possible side effects. Call your doctor for medical advice about side effects. You may report side effects to FDA at 1-800-FDA-1088. Where should I keep my medicine? This drug is given in a hospital or clinic and will not be stored at home. NOTE: This sheet is a summary. It may not cover all possible information. If you have questions about this medicine, talk to your doctor, pharmacist, or health care provider.  2015, [employer]. (2014-02-23 13:18:19)  

## 2015-06-10 NOTE — Progress Notes (Signed)
BMS [id number] - questionnaires (PROs) - patient into the cancer center for routine visit. Patient was given PROs upon arrival to the cancer center. The patient completed his PROs (EQ-[id number] first and then FACT-L) before any study procedures were performed. I checked the PROs for completeness.  Research blood was drawn.  The patient was thanked for his continued support of this clinical trial. Barb Melquisedec Journey 06/10/2015.03:15PM

## 2015-06-10 NOTE — Telephone Encounter (Signed)
Gave and printed appt sched and avs for pt for July...the patient wanted to go back to wednesdays

## 2015-06-10 NOTE — Progress Notes (Signed)
BMS 370 Group A: Arm B-3 (Nivolumab) Cycle 1 Met with patient, spouse and niece today in lobby prior to patient's lab, office visit and treatment appointment. Patient completed study required PRO's with research assistant Marthann Schiller, prior to his research required lab draws.  Resting vital signs obtained, concomitant medications reviewed, smoking history reviewed, baseline A/E's assessed again and patient was provided with study provided Patient Alert Card and educated on keeping the card in his wallet and when and how to use the Alert Card, patient and spouse gave verbal understanding and they deny questions at this point. Patient was educated on to call Dr. Julien Nordmann and report anything out of the ordinary for him and any new side effects that he may have. Patient was educated on not to take any new medications, prescription or over-the-counter, prior to discussing with Dr. Julien Nordmann. I encourage both patient and spouse to contact Dr. Julien Nordmann or myself should they have any questions or concerns. All patient's and spouse's questions answered to their satisfaction, deny further questions at this time,  and they both know to call clinic with any questions/concerns. Nivolumab vial assignment printout given to [name], Pharmacist and treatment infusion sign given to chemotherapy RN, [name].  Patient scheduled to return for cycle 2, 06/24/2015. Adele Dan, RN, Clinical Research 06/10/2015 5:01 PM

## 2015-06-10 NOTE — Progress Notes (Signed)
[place] [phone]   Fax:([phone]) [place], [place] [address] [place] [address]  DIAGNOSIS: Stage IV (T3, N3, M1b) non-small cell lung cancer, adenocarcinoma diagnosed in January 2016 presented with right upper lobe lung mass in addition to mediastinal lymphadenopathy and large right pleural effusion as well as bone metastasis.  PRIOR THERAPY:  1. Status post Pleurx catheter placement by Dr. Servando Snare for drainage of the right pleural effusion.  2. Systemic chemotherapy with carboplatin for AUC of 5 and Alimta 500 MG/M2 every 3 weeks. Status post 6 cycles.  CURRENT THERAPY: Patient is participating in the [place] 370 clinical trial and has been randomized to group A arm B-3 to receive Nivolumab 3 mg/kg given every 2 weeks. First cycle expected 06/10/2015.  INTERVAL HISTORY: COLDEN SAMARAS 80 y.o. male returns to the clinic today for follow-up visit accompanied by his wife. He reports still having issues with constipation. He had a small bowel movement yesterday and reports no significant relief with senna S tablets. He is partiipating in the BMS Checkmate 370 clinical trial. He has been randomized to receive immunotherapy with Nivolumab.  He notes some increase shortness of breath with exertion of his activities of daily living. His appetite remains decreased and he reports increased weakness as well as continued weight loss. He also reports occasional episodes of nausea that are well managed with his current anti-medic.  He denies fevers, chills, nausea, or vomiting. He has some shortness of breath with exertion but denies cough or hemoptysis. He has no pain. His Pleurx catheter has been removed.   MEDICAL HISTORY: Past Medical History  Diagnosis Date  . Hypertension   . GERD (gastroesophageal reflux disease)   . Shortness of breath dyspnea     01/14/15 at night and has to sit up  . Anxiety     due to diagnosis  of Stage 4 Lung Cancer  . Macular degeneration of right eye   . Cancer     Lung  . Diabetes mellitus without complication     ALLERGIES:  is allergic to marinol.  MEDICATIONS:  Current Outpatient Prescriptions  Medication Sig Dispense Refill  . amitriptyline (ELAVIL) 10 MG tablet Take 10 mg by mouth at bedtime.    Marland Kitchen aspirin 81 MG tablet Take 81 mg by mouth daily.    . folic acid (FOLVITE) 1 MG tablet Take 1 tablet (1 mg total) by mouth daily. 90 tablet 0  . HYDROcodone-acetaminophen (NORCO) 5-325 MG per tablet Take 1 tablet by mouth every 6 (six) hours as needed for moderate pain. 30 tablet 0  . magnesium hydroxide (MILK OF MAGNESIA) 400 MG/5ML suspension Take by mouth daily as needed for mild constipation.    . metFORMIN (GLUCOPHAGE-XR) 500 MG 24 hr tablet Take 500 mg by mouth daily with breakfast.    . prochlorperazine (COMPAZINE) 10 MG tablet Take 1 tablet (10 mg total) by mouth every 6 (six) hours as needed for nausea or vomiting. 30 tablet 0  . ranitidine (ZANTAC) 150 MG tablet Take 150 mg by mouth daily.    Marland Kitchen senna-docusate (SENNA S) 8.6-50 MG per tablet Take 2 tablets by mouth daily.     No current facility-administered medications for this visit.   Facility-Administered Medications Ordered in Other Visits  Medication Dose Route Frequency Provider Last Rate Last Dose  . 0.9 %  sodium chloride infusion   Intravenous Once Curt Bears, MD      .  INV-nivolumab BMS CA 209-370 (OPDIVO) 240 mg in sodium chloride 0.9 % 100 mL (1.9355 mg/mL) chemo infusion  240 mg Intravenous Once Curt Bears, MD        SURGICAL HISTORY:  Past Surgical History  Procedure Laterality Date  . Vein ligation and stripping  1977  . Chest tube insertion Right 01/15/2015    Procedure: INSERTION PLEURAL DRAINAGE CATHETER WITH ULTRASOUND AND FLURO GUIDANCE;  Surgeon: Grace Isaac, MD;  Location: [facility];  Service: Thoracic;  Laterality: Right;  . Removal of pleural drainage catheter Right 03/26/2015      Procedure: REMOVAL OF PLEURAL DRAINAGE CATHETER;  Surgeon: Grace Isaac, MD;  Location: [facility];  Service: Thoracic;  Laterality: Right;    REVIEW OF SYSTEMS:  A comprehensive review of systems was negative except for: Constitutional: positive for malaise and Generalized weakness Respiratory: positive for dyspnea on exertion Gastrointestinal: positive for constipation and lack of appetite.    PHYSICAL EXAMINATION: General appearance: alert, cooperative and no distress Head: Normocephalic, without obvious abnormality, atraumatic Neck: no adenopathy, no JVD, supple, symmetrical, trachea midline and thyroid not enlarged, symmetric, no tenderness/mass/nodules Lymph nodes: Cervical, supraclavicular, and axillary nodes normal. Resp: clear to auscultation bilaterally Back: symmetric, no curvature. ROM normal. No CVA tenderness. Cardio: regular rate and rhythm, S1, S2 normal, no murmur, click, rub or gallop GI: soft, non-tender; bowel sounds normal; no masses,  no organomegaly Extremities: extremities normal, atraumatic, no cyanosis or edema  ECOG PERFORMANCE STATUS: 1 - Symptomatic but completely ambulatory  Blood pressure 157/81, pulse 105, temperature 98.4 F (36.9 C), temperature source Oral, resp. rate 18, height '5\' 6"'$  (1.676 m), weight 145 lb 12.8 oz (66.134 kg), SpO2 98 %.  LABORATORY DATA: Lab Results  Component Value Date   WBC 8.7 06/10/2015   HGB 10.7* 06/10/2015   HCT 32.6* 06/10/2015   MCV 103.5* 06/10/2015   PLT 278 06/10/2015      Chemistry      Component Value Date/Time   NA 138 06/10/2015 1503   NA 138 01/15/2015 0608   K 3.3* 06/10/2015 1503   K 3.5 01/15/2015 0608   CL 97 01/15/2015 0608   CO2 29 06/10/2015 1503   CO2 34* 01/15/2015 0608   BUN 9.2 06/10/2015 1503   BUN 13 01/15/2015 0608   CREATININE 0.8 06/10/2015 1503   CREATININE 0.96 01/15/2015 0608   GLU 231* 03/10/2015 1539      Component Value Date/Time   CALCIUM 9.2 06/10/2015 1503   CALCIUM  9.4 01/15/2015 0608   ALKPHOS 99 06/10/2015 1503   ALKPHOS 102 01/15/2015 0608   AST 21 06/10/2015 1503   AST 42* 01/15/2015 0608   ALT 17 06/10/2015 1503   ALT 38 01/15/2015 0608   BILITOT 0.20 06/10/2015 1503   BILITOT 0.8 01/15/2015 0608       RADIOGRAPHIC STUDIES: Ct Chest W Contrast  06/10/2015   ADDENDUM REPORT: 06/10/2015 09:11  ADDENDUM: Within the findings and impression section, radiation change is mentioned. Per the patient, they have not had radiation therapy. Therefore, findings are likely related to tumor with surrounding postobstructive pneumonitis.  RECIST 1.1  Target Lesions:  1. Right upper lobe lung mass of 6.1 x 4.2 cm on image 19 of series 4. 2. Right iliac lytic bone metastasis at 3.6 cm on image 90. Non-target Lesions:  1. AP window node of 11 mm.  Image 23 of series 2.   Electronically Signed   By: Abigail Miyamoto M.D.   On: 06/10/2015 09:11  06/10/2015   CLINICAL DATA:  Lung cancer diagnosed 1/16. Chemotherapy in progress. Cough. Ex-smoker. Nausea. Constipation. Bone metastasis. Restaging.  EXAM: CT CHEST, ABDOMEN, AND PELVIS WITH CONTRAST  TECHNIQUE: Multidetector CT imaging of the chest, abdomen and pelvis was performed following the standard protocol during bolus administration of intravenous contrast.  CONTRAST:  181m OMNIPAQUE IOHEXOL 300 MG/ML  SOLN  COMPARISON:  03/24/2015.  FINDINGS: CT CHEST FINDINGS  Mediastinum/Nodes: No supraclavicular adenopathy. Subcentimeter low-density left thyroid nodule is unchanged and of doubtful clinical significance.  Aortic and branch vessel atherosclerosis. Normal heart size, without pericardial effusion. Multivessel coronary artery atherosclerosis. No central pulmonary embolism, on this non-dedicated study. Right paratracheal node measures 9 mm on image 23 and is similar to on the prior exam (when remeasured). An 8 mm subcarinal node is similar. No hilar adenopathy. 11 mm AP window node is not significantly changed.  6 mm high right  paratracheal node on image 13 is decreased in size from 9 mm on the prior.  Lungs/Pleura: Right-sided pleural thickening with trace right pleural fluid, similar. Removal of right-sided pleural drain. There is trace right apical pleural air on image 13, of indeterminate etiology.  Secretions along the right-sided the trachea. Mild centrilobular emphysema. Right-sided septal thickening which is likely radiation induced. Clear left lung. This right apical lung mass is difficult to differentiate from surrounding radiation change. 6.1 x 4.2 cm on image 19. Decreased from 6.3 x 4.6 cm on the prior exam.  Musculoskeletal: Similar sclerotic lesion within the T11 vertebral body. Make sclerosis and increased density within the right scapula is similar.  CT ABDOMEN AND PELVIS FINDINGS  Hepatobiliary: Mild to moderate cirrhosis, without focal liver lesion. No calcified gallstones. Mucosal hyper enhancement within the gallbladder is increased and nonspecific. Common duct dilatation at 1.3 cm is not significantly changed, including back to 12/16/2014. No intrahepatic ductal dilatation. No choledocholithiasis.  Pancreas: Pancreatic atrophy is mild. No duct dilatation or acute inflammation.  Spleen: Normal  Adrenals/Urinary Tract: Normal adrenal glands. Too small to characterize right renal lesions. Normal left kidney. Normal urinary bladder.  Stomach/Bowel: Normal stomach, without wall thickening. Normal terminal ileum and appendix. Normal small bowel.  Vascular/Lymphatic: Advanced aortic and branch vessel atherosclerosis. No abdominopelvic adenopathy.  Reproductive: Moderate prostatomegaly. Tiny fat containing right inguinal hernia.  Other: No significant free fluid.  Musculoskeletal: Right iliac lytic lesion with cortical destruction and healing pathologic fracture. Grossly similar to on the prior. There may be slight interval increase in surrounding sclerosis indicative of healing. Subcentimeter left sacral lesion is not  significantly changed.  IMPRESSION: 1. Slight decrease in size of a right upper lobe lung mass. This is difficult to differentiate from surrounding radiation change. 2. Similar to decreased size of thoracic nodes. 3. Similar osseous metastasis.  No new sites of disease identified. 4. Similar small right pleural effusion and pleural thickening. Interval removal of pleural drain with trace right apical pleural air of indeterminate etiology. 5.  Atherosclerosis, including within the coronary arteries. 6. Cirrhosis without focal liver lesion. 7. Chronic common duct dilatation, without cause identified. Correlate with bilirubin levels. 8. Nonspecific mucosal hyper enhancement involving the gallbladder, increased.  Electronically Signed: By: KAbigail MiyamotoM.D. On: 05/24/2015 14:26   Ct Abdomen Pelvis W Contrast  06/10/2015   ADDENDUM REPORT: 06/10/2015 09:11  ADDENDUM: Within the findings and impression section, radiation change is mentioned. Per the patient, they have not had radiation therapy. Therefore, findings are likely related to tumor with surrounding postobstructive pneumonitis.  RECIST 1.1  Target  Lesions:  1. Right upper lobe lung mass of 6.1 x 4.2 cm on image 19 of series 4. 2. Right iliac lytic bone metastasis at 3.6 cm on image 90. Non-target Lesions:  1. AP window node of 11 mm.  Image 23 of series 2.   Electronically Signed   By: Abigail Miyamoto M.D.   On: 06/10/2015 09:11   06/10/2015   CLINICAL DATA:  Lung cancer diagnosed 1/16. Chemotherapy in progress. Cough. Ex-smoker. Nausea. Constipation. Bone metastasis. Restaging.  EXAM: CT CHEST, ABDOMEN, AND PELVIS WITH CONTRAST  TECHNIQUE: Multidetector CT imaging of the chest, abdomen and pelvis was performed following the standard protocol during bolus administration of intravenous contrast.  CONTRAST:  179m OMNIPAQUE IOHEXOL 300 MG/ML  SOLN  COMPARISON:  03/24/2015.  FINDINGS: CT CHEST FINDINGS  Mediastinum/Nodes: No supraclavicular adenopathy.  Subcentimeter low-density left thyroid nodule is unchanged and of doubtful clinical significance.  Aortic and branch vessel atherosclerosis. Normal heart size, without pericardial effusion. Multivessel coronary artery atherosclerosis. No central pulmonary embolism, on this non-dedicated study. Right paratracheal node measures 9 mm on image 23 and is similar to on the prior exam (when remeasured). An 8 mm subcarinal node is similar. No hilar adenopathy. 11 mm AP window node is not significantly changed.  6 mm high right paratracheal node on image 13 is decreased in size from 9 mm on the prior.  Lungs/Pleura: Right-sided pleural thickening with trace right pleural fluid, similar. Removal of right-sided pleural drain. There is trace right apical pleural air on image 13, of indeterminate etiology.  Secretions along the right-sided the trachea. Mild centrilobular emphysema. Right-sided septal thickening which is likely radiation induced. Clear left lung. This right apical lung mass is difficult to differentiate from surrounding radiation change. 6.1 x 4.2 cm on image 19. Decreased from 6.3 x 4.6 cm on the prior exam.  Musculoskeletal: Similar sclerotic lesion within the T11 vertebral body. Make sclerosis and increased density within the right scapula is similar.  CT ABDOMEN AND PELVIS FINDINGS  Hepatobiliary: Mild to moderate cirrhosis, without focal liver lesion. No calcified gallstones. Mucosal hyper enhancement within the gallbladder is increased and nonspecific. Common duct dilatation at 1.3 cm is not significantly changed, including back to 12/16/2014. No intrahepatic ductal dilatation. No choledocholithiasis.  Pancreas: Pancreatic atrophy is mild. No duct dilatation or acute inflammation.  Spleen: Normal  Adrenals/Urinary Tract: Normal adrenal glands. Too small to characterize right renal lesions. Normal left kidney. Normal urinary bladder.  Stomach/Bowel: Normal stomach, without wall thickening. Normal terminal  ileum and appendix. Normal small bowel.  Vascular/Lymphatic: Advanced aortic and branch vessel atherosclerosis. No abdominopelvic adenopathy.  Reproductive: Moderate prostatomegaly. Tiny fat containing right inguinal hernia.  Other: No significant free fluid.  Musculoskeletal: Right iliac lytic lesion with cortical destruction and healing pathologic fracture. Grossly similar to on the prior. There may be slight interval increase in surrounding sclerosis indicative of healing. Subcentimeter left sacral lesion is not significantly changed.  IMPRESSION: 1. Slight decrease in size of a right upper lobe lung mass. This is difficult to differentiate from surrounding radiation change. 2. Similar to decreased size of thoracic nodes. 3. Similar osseous metastasis.  No new sites of disease identified. 4. Similar small right pleural effusion and pleural thickening. Interval removal of pleural drain with trace right apical pleural air of indeterminate etiology. 5.  Atherosclerosis, including within the coronary arteries. 6. Cirrhosis without focal liver lesion. 7. Chronic common duct dilatation, without cause identified. Correlate with bilirubin levels. 8. Nonspecific mucosal hyper enhancement involving the  gallbladder, increased.  Electronically Signed: By: Abigail Miyamoto M.D. On: 05/24/2015 14:26    ASSESSMENT AND PLAN: This is a very pleasant 80 years old white male recently diagnosed with a stage IV non-small cell lung cancer, adenocarcinoma and currently undergoing systemic chemotherapy with carboplatin and Alimta status post 6 cycles.  Overall Mr. Buschman tolerated his chemotherapy relatively well. He is again encouraged to increase his by mouth intake of food and fluids. The patient was discussed with and also seen by Dr. Julien Nordmann. His recent restaging CT scan showed slight decrease in the size of the right upper lobe lung mass and overall stable disease. Mr. Cremer was given several options regarding treatment  including: Observation, maintenance chemotherapy, or participation and clinical trial,specifically BMS Checkmate 370. Mr. Dehart was interested in participating in the clinical trial and was accepted and randomized to receive immunotherapy with  Nivolumab. He'll proceed with cycle #1 today and follow-up in 2 weeks for reevaluation prior to the start of cycle #2   He understands and agrees with this plan. He has been encouraged to call with any issues that might arise before his next visit here.   Carlton Adam, PA-C 06/10/2015

## 2015-06-11 ENCOUNTER — Telehealth: Payer: Self-pay

## 2015-06-11 ENCOUNTER — Other Ambulatory Visit: Payer: Self-pay | Admitting: *Deleted

## 2015-06-11 ENCOUNTER — Encounter: Payer: Self-pay | Admitting: Medical Oncology

## 2015-06-11 ENCOUNTER — Telehealth: Payer: Self-pay | Admitting: Medical Oncology

## 2015-06-11 DIAGNOSIS — C3411 Malignant neoplasm of upper lobe, right bronchus or lung: Secondary | ICD-10-CM

## 2015-06-11 MED ORDER — POTASSIUM CHLORIDE CRYS ER 20 MEQ PO TBCR: 20.0000 meq | EXTENDED_RELEASE_TABLET | Freq: Every day | ORAL | Status: DC

## 2015-06-11 NOTE — Progress Notes (Signed)
BMS 370 Group A: Arm B-1 Follow-up call to patient to see how he is doing following Cycle 1 of Nivolumab yesterday. Spoke with wife, Caryl Asp, she states patient left for the grocery store. Joy confirms patient with no issues or complaints, denies any new symptoms and is doing well. She also states she is happy to see him starting to feel better. I confirmed patient's upcoming appointment on July 7th and informed spouse that I will be out of office for that week and wanted them to know that another research nurse, one of my coworkers, will follow-up with them for the July 7th appointment. Joy gave verbal understanding and denies any questions at this time. I thanked her for her time and continued support of study and encouraged them to call Dr. Julien Nordmann or myself with any questions/concerns.  Adele Dan, RN, Clinical Research 06/11/2015 3:52 PM

## 2015-06-11 NOTE — Telephone Encounter (Signed)
Pt denies any problems after chemo, discussed n/v/eating/pain/fatigue/drinking water. Encouraged to call if any questions or concerns.

## 2015-06-11 NOTE — Telephone Encounter (Signed)
-----   Message from Cheree Ditto, RN sent at 06/10/2015  4:45 PM EDT ----- Regarding: 1st time chemo; Nivolumab; Dr. Carollee Herter: [phone] 1st time Nivolumab; M.M.

## 2015-06-11 NOTE — Telephone Encounter (Signed)
Called patient to inform him that his potassium is low @ 3.3 from yesterday's lab draw. Dr. Benay Spice on-call today, per MD, prescription ordered. Informed patient to take 1 tablet, 20 meq,  Daily for 7 days. Patient gave verbal understanding and states he will pick up prescription tonight. Patient knows to call office with questions/concerns.

## 2015-06-13 NOTE — Patient Instructions (Signed)
Follow-up in 2 weeks prior to next scheduled cycle of immunotherapy 

## 2015-06-17 ENCOUNTER — Other Ambulatory Visit: Payer: Self-pay | Admitting: Medical Oncology

## 2015-06-17 DIAGNOSIS — C3411 Malignant neoplasm of upper lobe, right bronchus or lung: Secondary | ICD-10-CM

## 2015-06-24 ENCOUNTER — Encounter: Payer: Self-pay | Admitting: Nurse Practitioner

## 2015-06-24 ENCOUNTER — Other Ambulatory Visit (HOSPITAL_COMMUNITY)
Admission: RE | Admit: 2015-06-24 | Discharge: 2015-06-24 | Disposition: A | Discharge status: Home / Self Care | Payer: Medicare Other | Source: Ambulatory Visit | Attending: Internal Medicine | Admitting: Internal Medicine

## 2015-06-24 ENCOUNTER — Ambulatory Visit (HOSPITAL_BASED_OUTPATIENT_CLINIC_OR_DEPARTMENT_OTHER): Payer: Medicare Other | Admitting: Nurse Practitioner

## 2015-06-24 ENCOUNTER — Encounter: Payer: Self-pay | Admitting: Internal Medicine

## 2015-06-24 ENCOUNTER — Encounter: Payer: Self-pay | Admitting: *Deleted

## 2015-06-24 ENCOUNTER — Ambulatory Visit (HOSPITAL_BASED_OUTPATIENT_CLINIC_OR_DEPARTMENT_OTHER): Payer: Medicare Other

## 2015-06-24 ENCOUNTER — Other Ambulatory Visit (HOSPITAL_BASED_OUTPATIENT_CLINIC_OR_DEPARTMENT_OTHER): Payer: Medicare Other

## 2015-06-24 VITALS — BP 145/75 | HR 103 | Temp 97.5°F | Resp 18 | Ht 66.0 in | Wt 143.0 lb

## 2015-06-24 DIAGNOSIS — C3411 Malignant neoplasm of upper lobe, right bronchus or lung: Secondary | ICD-10-CM | POA: Insufficient documentation

## 2015-06-24 DIAGNOSIS — Z006 Encounter for examination for normal comparison and control in clinical research program: Secondary | ICD-10-CM

## 2015-06-24 DIAGNOSIS — Z87891 Personal history of nicotine dependence: Secondary | ICD-10-CM | POA: Insufficient documentation

## 2015-06-24 DIAGNOSIS — K5900 Constipation, unspecified: Secondary | ICD-10-CM

## 2015-06-24 DIAGNOSIS — Z5112 Encounter for antineoplastic immunotherapy: Secondary | ICD-10-CM

## 2015-06-24 LAB — COMPREHENSIVE METABOLIC PANEL (CC13)
ALBUMIN: 2.8 g/dL — AB (ref 3.5–5.0)
ALK PHOS: 87 U/L (ref 40–150)
ALT: 15 U/L (ref 0–55)
AST: 17 U/L (ref 5–34)
Anion Gap: 6 mEq/L (ref 3–11)
BILIRUBIN TOTAL: 0.22 mg/dL (ref 0.20–1.20)
BUN: 9.5 mg/dL (ref 7.0–26.0)
CO2: 31 mEq/L — ABNORMAL HIGH (ref 22–29)
Calcium: 9.9 mg/dL (ref 8.4–10.4)
Chloride: 101 mEq/L (ref 98–109)
Creatinine: 0.9 mg/dL (ref 0.7–1.3)
EGFR: 83 mL/min/{1.73_m2} — AB (ref >90)
GLUCOSE: 151 mg/dL — AB (ref 70–140)
POTASSIUM: 4.5 meq/L (ref 3.5–5.1)
Sodium: 138 mEq/L (ref 136–145)
Total Protein: 7.2 g/dL (ref 6.4–8.3)

## 2015-06-24 LAB — PHOSPHORUS: Phosphorus: 3.9 mg/dL (ref 2.5–4.6)

## 2015-06-24 LAB — CBC WITH DIFFERENTIAL/PLATELET
BASO%: 1.3 % (ref 0.0–2.0)
BASOS ABS: 0.1 10*3/uL (ref 0.0–0.1)
EOS%: 1.8 % (ref 0.0–7.0)
Eosinophils Absolute: 0.1 10*3/uL (ref 0.0–0.5)
HEMATOCRIT: 35.4 % — AB (ref 38.4–49.9)
HGB: 11.6 g/dL — ABNORMAL LOW (ref 13.0–17.1)
LYMPH%: 22.4 % (ref 14.0–49.0)
MCH: 33.5 pg — ABNORMAL HIGH (ref 27.2–33.4)
MCHC: 32.9 g/dL (ref 32.0–36.0)
MCV: 101.8 fL — ABNORMAL HIGH (ref 79.3–98.0)
MONO#: 1 10*3/uL — ABNORMAL HIGH (ref 0.1–0.9)
MONO%: 13.5 % (ref 0.0–14.0)
NEUT#: 4.7 10*3/uL (ref 1.5–6.5)
NEUT%: 61 % (ref 39.0–75.0)
PLATELETS: 250 10*3/uL (ref 140–400)
RBC: 3.48 10*6/uL — ABNORMAL LOW (ref 4.20–5.82)
RDW: 15.1 % — ABNORMAL HIGH (ref 11.0–14.6)
WBC: 7.7 10*3/uL (ref 4.0–10.3)
lymph#: 1.7 10*3/uL (ref 0.9–3.3)

## 2015-06-24 LAB — LACTATE DEHYDROGENASE (CC13): LDH: 118 U/L — AB (ref 125–245)

## 2015-06-24 LAB — MAGNESIUM (CC13): Magnesium: 2.1 mg/dl (ref 1.5–2.5)

## 2015-06-24 MED ORDER — SODIUM CHLORIDE 0.9 % IV SOLN
Freq: Once | INTRAVENOUS | Status: AC
  Administered 2015-06-24: 15:00:00 via INTRAVENOUS

## 2015-06-24 MED ORDER — SODIUM CHLORIDE 0.9 % IV SOLN
240.0000 mg | Freq: Once | INTRAVENOUS | Status: AC
  Administered 2015-06-24: 240 mg via INTRAVENOUS
  Filled 2015-06-24: qty 24

## 2015-06-24 NOTE — Progress Notes (Signed)
BMS [id number] - questionnaires (PROs) - patient into the cancer center for routine visit. Patient was given PROs upon arrival to the cancer center. The patient completed his PROs ([id number] first and then the FACT-L booklet) before any study procedures were performed. I checked the PROs for completeness.  Patient's research blood was drawn 06/10/2015.  The patient was thanked for his continued support of this clinical trial. Barb Yeilin Zweber 06/24/2015 1:29 PM

## 2015-06-24 NOTE — Progress Notes (Signed)
[place] Telephone:(336) [phone]   Fax:(336) [place], MD [address] [place] [address]  DIAGNOSIS: Stage IV (T3, N3, M1b) non-small cell lung cancer, adenocarcinoma diagnosed in January 2016 presented with right upper lobe lung mass in addition to mediastinal lymphadenopathy and large right pleural effusion as well as bone metastasis.  PRIOR THERAPY:  1. Status post Pleurx catheter placement by Dr. Servando Snare for drainage of the right pleural effusion.  2. Systemic chemotherapy with carboplatin for AUC of 5 and Alimta 500 MG/M2 every 3 weeks. Status post 6 cycles.  CURRENT THERAPY: Patient is participating in the [employer] 370 clinical trial and has been randomized to group A arm B-3 to receive Nivolumab 3 mg/kg given every 2 weeks. First cycle expected 06/10/2015.  INTERVAL HISTORY: Omar Peters 80 y.o. male returns to the clinic today for follow-up visit accompanied by his wife and research nurse, Katy Apo. He is here to begin cycle #2 of nivolumab from the BMS 370 trial. He tolerates this well with no complaints. This week he denies fevers, chills, nausea, or vomiting. His constipation is no better since starting sennakot S as advised at his last visit. He also uses milk of magnesia and ex-lax. His appetite is down and he has lost 2lbs. He has continues shortness of breath with exertion, but denies cough or hemoptysis.   MEDICAL HISTORY: Past Medical History  Diagnosis Date  . Hypertension   . GERD (gastroesophageal reflux disease)   . Shortness of breath dyspnea     01/14/15 at night and has to sit up  . Anxiety     due to diagnosis of Stage 4 Lung Cancer  . Macular degeneration of right eye   . Cancer     Lung  . Diabetes mellitus without complication     ALLERGIES:  is allergic to marinol.  MEDICATIONS:  Current Outpatient Prescriptions  Medication Sig Dispense Refill  . aspirin 81 MG tablet Take  81 mg by mouth daily.    . folic acid (FOLVITE) 1 MG tablet Take 1 tablet (1 mg total) by mouth daily. 90 tablet 0  . magnesium hydroxide (MILK OF MAGNESIA) 400 MG/5ML suspension Take by mouth daily as needed for mild constipation.    . metFORMIN (GLUCOPHAGE-XR) 500 MG 24 hr tablet Take 500 mg by mouth daily with breakfast.    . ranitidine (ZANTAC) 150 MG tablet Take 150 mg by mouth daily.    Marland Kitchen senna-docusate (SENNA S) 8.6-50 MG per tablet Take 2 tablets by mouth daily.    Marland Kitchen amitriptyline (ELAVIL) 10 MG tablet Take 10 mg by mouth at bedtime.    Marland Kitchen HYDROcodone-acetaminophen (NORCO) 5-325 MG per tablet Take 1 tablet by mouth every 6 (six) hours as needed for moderate pain. (Patient not taking: Reported on 06/24/2015) 30 tablet 0  . prochlorperazine (COMPAZINE) 10 MG tablet Take 1 tablet (10 mg total) by mouth every 6 (six) hours as needed for nausea or vomiting. (Patient not taking: Reported on 06/24/2015) 30 tablet 0   No current facility-administered medications for this visit.    SURGICAL HISTORY:  Past Surgical History  Procedure Laterality Date  . Vein ligation and stripping  1977  . Chest tube insertion Right 01/15/2015    Procedure: INSERTION PLEURAL DRAINAGE CATHETER WITH ULTRASOUND AND FLURO GUIDANCE;  Surgeon: Grace Isaac, MD;  Location: [facility];  Service: Thoracic;  Laterality: Right;  . Removal of pleural  drainage catheter Right 03/26/2015    Procedure: REMOVAL OF PLEURAL DRAINAGE CATHETER;  Surgeon: Grace Isaac, MD;  Location: [facility];  Service: Thoracic;  Laterality: Right;    REVIEW OF SYSTEMS:  A comprehensive review of systems was negative except for: Constitutional: positive for malaise and Generalized weakness Respiratory: positive for dyspnea on exertion Gastrointestinal: positive for constipation and lack of appetite.    PHYSICAL EXAMINATION: General appearance: alert, cooperative and no distress Head: Normocephalic, without obvious abnormality, atraumatic Neck: no  adenopathy, no JVD, supple, symmetrical, trachea midline and thyroid not enlarged, symmetric, no tenderness/mass/nodules Lymph nodes: Cervical, supraclavicular, and axillary nodes normal. Resp: clear to auscultation bilaterally Back: symmetric, no curvature. ROM normal. No CVA tenderness. Cardio: regular rate and rhythm, S1, S2 normal, no murmur, click, rub or gallop GI: soft, non-tender; bowel sounds normal; no masses,  no organomegaly Extremities: extremities normal, atraumatic, no cyanosis or edema  ECOG PERFORMANCE STATUS: 1 - Symptomatic but completely ambulatory  Blood pressure 145/75, pulse 103, temperature 97.5 F (36.4 C), temperature source Oral, resp. rate 18, height '5\' 6"'$  (1.676 m), weight 143 lb (64.864 kg), SpO2 99 %.  LABORATORY DATA: Lab Results  Component Value Date   WBC 7.7 06/24/2015   HGB 11.6* 06/24/2015   HCT 35.4* 06/24/2015   MCV 101.8* 06/24/2015   PLT 250 06/24/2015      Chemistry      Component Value Date/Time   NA 138 06/24/2015 1313   NA 138 01/15/2015 0608   K 4.5 06/24/2015 1313   K 3.5 01/15/2015 0608   CL 97 01/15/2015 0608   CO2 31* 06/24/2015 1313   CO2 34* 01/15/2015 0608   BUN 9.5 06/24/2015 1313   BUN 13 01/15/2015 0608   CREATININE 0.9 06/24/2015 1313   CREATININE 0.96 01/15/2015 0608   GLU 231* 03/10/2015 1539      Component Value Date/Time   CALCIUM 9.9 06/24/2015 1313   CALCIUM 9.4 01/15/2015 0608   ALKPHOS 87 06/24/2015 1313   ALKPHOS 102 01/15/2015 0608   AST 17 06/24/2015 1313   AST 42* 01/15/2015 0608   ALT 15 06/24/2015 1313   ALT 38 01/15/2015 0608   BILITOT 0.22 06/24/2015 1313   BILITOT 0.8 01/15/2015 3825       RADIOGRAPHIC STUDIES: No results found.  ASSESSMENT AND PLAN: This is a very pleasant 80 years old white male recently diagnosed with a stage IV non-small cell lung cancer, adenocarcinoma and recieved systemic chemotherapy with carboplatin and Alimta status post 6 cycles. He is currently participating  in the BMS 370 trial, randomized to receive immunotherapy with nivolumab, status post 1 cycle.  He tolerates this therapy well with no complaints. The labs were reviewed in detail and were entirely stable.  He has a long history of chronic constipation.  I advised him to add metamucil to his daily regimen of senakot S and miralax, and to make a goal to drink 1 quart of water daily.  It has been a prudent amount of time since his last dose of magnesium citrate, so he will try this again this week. He knows not to use this very often.  Mr. Roye will proceed with cycle #2 as planned, and return in 2 weeks for follow up.   He understands and agrees with this plan. He has been encouraged to call with any issues that might arise before his next visit here.   Laurie Panda, NP 06/24/2015

## 2015-06-24 NOTE — Patient Instructions (Signed)
[facility] Discharge Instructions for Patients Receiving Chemotherapy  Today you received the following chemotherapy agents: Nivolumab  To help prevent nausea and vomiting after your treatment, we encourage you to take your nausea medication as prescribed.   If you develop nausea and vomiting that is not controlled by your nausea medication, call the clinic.   BELOW ARE SYMPTOMS THAT SHOULD BE REPORTED IMMEDIATELY:  *FEVER GREATER THAN 100.5 F  *CHILLS WITH OR WITHOUT FEVER  NAUSEA AND VOMITING THAT IS NOT CONTROLLED WITH YOUR NAUSEA MEDICATION  *UNUSUAL SHORTNESS OF BREATH  *UNUSUAL BRUISING OR BLEEDING  TENDERNESS IN MOUTH AND THROAT WITH OR WITHOUT PRESENCE OF ULCERS  *URINARY PROBLEMS  *BOWEL PROBLEMS  UNUSUAL RASH Items with * indicate a potential emergency and should be followed up as soon as possible.  Feel free to call the clinic you have any questions or concerns. The clinic phone number is [phone].  Please show the CHEMO ALERT CARD at check-in to the Emergency Department and triage nurse.      Nivolumab injection What is this medicine? NIVOLUMAB (nye VOL ue mab) is used to treat certain types of melanoma and lung cancer. This medicine may be used for other purposes; ask your health care provider or pharmacist if you have questions. COMMON BRAND NAME(S): Opdivo What should I tell my health care provider before I take this medicine? They need to know if you have any of these conditions: -eye disease, vision problems -history of pancreatitis -immune system problems -inflammatory bowel disease -kidney disease -liver disease -lung disease -lupus -myasthenia gravis -multiple sclerosis -organ transplant -stomach or intestine problems -thyroid disease -tingling of the fingers or toes, or other nerve disorder -an unusual or allergic reaction to nivolumab, other medicines, foods, dyes, or preservatives -pregnant or trying to get  pregnant -breast-feeding How should I use this medicine? This medicine is for infusion into a vein. It is given by a health care professional in a hospital or clinic setting. A special MedGuide will be given to you before each treatment. Be sure to read this information carefully each time. Talk to your pediatrician regarding the use of this medicine in children. Special care may be needed. Overdosage: If you think you've taken too much of this medicine contact a poison control center or emergency room at once. Overdosage: If you think you have taken too much of this medicine contact a poison control center or emergency room at once. NOTE: This medicine is only for you. Do not share this medicine with others. What if I miss a dose? It is important not to miss your dose. Call your doctor or health care professional if you are unable to keep an appointment. What may interact with this medicine? Interactions have not been studied. This list may not describe all possible interactions. Give your health care provider a list of all the medicines, herbs, non-prescription drugs, or dietary supplements you use. Also tell them if you smoke, drink alcohol, or use illegal drugs. Some items may interact with your medicine. What should I watch for while using this medicine? Tell your doctor or healthcare professional if your symptoms do not start to get better or if they get worse. Your condition will be monitored carefully while you are receiving this medicine. You may need blood work done while you are taking this medicine. What side effects may I notice from receiving this medicine? Side effects that you should report to your doctor or health care professional as soon as possible: -  allergic reactions like skin rash, itching or hives, swelling of the face, lips, or tongue -black, tarry stools -bloody or watery diarrhea -changes in vision -chills -cough -depressed mood -eye pain -feeling  anxious -fever -general ill feeling or flu-like symptoms -hair loss -loss of appetite -low blood counts - this medicine may decrease the number of white blood cells, red blood cells and platelets. You may be at increased risk for infections and bleeding -pain, tingling, numbness in the hands or feet -redness, blistering, peeling or loosening of the skin, including inside the mouth -red pinpoint spots on skin -signs of decreased platelets or bleeding - bruising, pinpoint red spots on the skin, black, tarry stools, blood in the urine -signs of decreased red blood cells - unusually weak or tired, feeling faint or lightheaded, falls -signs of infection - fever or chills, cough, sore throat, pain or trouble passing urine -signs and symptoms of a dangerous change in heartbeat or heart rhythm like chest pain; dizziness; fast or irregular heartbeat; palpitations; feeling faint or lightheaded, falls; breathing problems -signs and symptoms of high blood sugar such as dizziness; dry mouth; dry skin; fruity breath; nausea; stomach pain; increased hunger or thirst; increased urination -signs and symptoms of kidney injury like trouble passing urine or change in the amount of urine -signs and symptoms of liver injury like dark yellow or brown urine; general ill feeling or flu-like symptoms; light-colored stools; loss of appetite; nausea; right upper belly pain; unusually weak or tired; yellowing of the eyes or skin -signs and symptoms of increased potassium like muscle weakness; chest pain; or fast, irregular heartbeat -signs and symptoms of low potassium like muscle cramps or muscle pain; chest pain; dizziness; feeling faint or lightheaded, falls; palpitations; breathing problems; or fast, irregular heartbeat -swelling of the ankles, feet, hands -weight gainSide effects that usually do not require medical attention (report to your doctor or health care professional if they continue or are  bothersome): -constipation -general ill feeling or flu-like symptoms -hair loss -loss of appetite -nausea, vomiting This list may not describe all possible side effects. Call your doctor for medical advice about side effects. You may report side effects to FDA at 1-800-FDA-1088. Where should I keep my medicine? This drug is given in a hospital or clinic and will not be stored at home. NOTE: This sheet is a summary. It may not cover all possible information. If you have questions about this medicine, talk to your doctor, pharmacist, or health care provider.  2015, Elsevier/Gold Standard. (2014-02-23 13:18:19)  

## 2015-06-24 NOTE — Progress Notes (Signed)
06/24/15 at 4:00pm- BMS [id number] - cycle 2, day 1 study notes- The research nurse met with patient and his wife today in the lobby prior to patient's lab, office visit, and treatment appointment.  The patient completed his study required PRO's with research assistant Marthann Schiller, prior to his lab appointment. The pt's resting vital signs and pulse-ox (99% on RA) were obtained.  The pt's weight was obtained, and the pt has lost 2 lbs since his last office visit.  The pt said that he is drinking a milkshake with a can of Boost every evening. The pt's concomitant medications were reviewed with the pt and his wife.  He denies any new medications other than Ex-Lax prn for his constipation.  He reports that he is no longer taking his K-dur.   The pt's A/E's after his first cycle of nivolumab were assessed by Gentry Fitz, NP. The pt still complains that constipation is his major concern.  The pt said that constipation has been a problem for him since he started chemo in January 2016.  The pt specifically denies any nausea and vomiting.  The NP noted that the pt definitely has decreased bowel sounds.  She advised the pt to drink a bottle of Magnesium Citrate, increase his fiber with Metamucil, and to drink at least a quart of water daily.  She also encouraged the pt to take 2 Sennokot tablets in the morning and 2 in the evening.  The pt said that his energy level is good.  He states that he walks "around his yard everyday".  He does report some dyspnea while walking.  The pt specifically denies any pain and cough.  He also denies any worsening respiratory symptoms.  He denies swelling, headaches, and skin issues.  The pt said that he was very pleased with how well he tolerated his first cycle of nivolumab.  All of the patient's and spouse's questions were answered to their satisfaction.  Nivolumab vial assignment printout given to Raul Del, pharmacist and the treatment infusion sign was given to the infusion room  RN, Arrie Aran. Patient scheduled to return for cycle 3 on 07/07/2015.  Brion Aliment RN, BSN, CCRP Clinical Research Nurse 06/24/2015 4:37 PM

## 2015-07-06 ENCOUNTER — Other Ambulatory Visit: Payer: Self-pay | Admitting: Medical Oncology

## 2015-07-06 DIAGNOSIS — C3411 Malignant neoplasm of upper lobe, right bronchus or lung: Secondary | ICD-10-CM

## 2015-07-07 ENCOUNTER — Other Ambulatory Visit (HOSPITAL_COMMUNITY)
Admission: RE | Admit: 2015-07-07 | Discharge: 2015-07-07 | Disposition: A | Discharge status: Home / Self Care | Payer: Medicare Other | Source: Ambulatory Visit | Attending: Internal Medicine | Admitting: Internal Medicine

## 2015-07-07 ENCOUNTER — Ambulatory Visit (HOSPITAL_BASED_OUTPATIENT_CLINIC_OR_DEPARTMENT_OTHER): Payer: Medicare Other | Admitting: Internal Medicine

## 2015-07-07 ENCOUNTER — Encounter: Payer: Self-pay | Admitting: Internal Medicine

## 2015-07-07 ENCOUNTER — Other Ambulatory Visit (HOSPITAL_BASED_OUTPATIENT_CLINIC_OR_DEPARTMENT_OTHER): Payer: Medicare Other

## 2015-07-07 ENCOUNTER — Telehealth: Payer: Self-pay | Admitting: Internal Medicine

## 2015-07-07 ENCOUNTER — Encounter: Payer: Self-pay | Admitting: Medical Oncology

## 2015-07-07 ENCOUNTER — Ambulatory Visit (HOSPITAL_BASED_OUTPATIENT_CLINIC_OR_DEPARTMENT_OTHER): Payer: Medicare Other

## 2015-07-07 VITALS — BP 155/74 | HR 96 | Temp 97.7°F | Resp 16 | Ht 66.0 in | Wt 142.9 lb

## 2015-07-07 DIAGNOSIS — C3411 Malignant neoplasm of upper lobe, right bronchus or lung: Secondary | ICD-10-CM | POA: Insufficient documentation

## 2015-07-07 DIAGNOSIS — T451X5A Adverse effect of antineoplastic and immunosuppressive drugs, initial encounter: Secondary | ICD-10-CM

## 2015-07-07 DIAGNOSIS — C7951 Secondary malignant neoplasm of bone: Secondary | ICD-10-CM

## 2015-07-07 DIAGNOSIS — D6481 Anemia due to antineoplastic chemotherapy: Secondary | ICD-10-CM

## 2015-07-07 DIAGNOSIS — Z006 Encounter for examination for normal comparison and control in clinical research program: Secondary | ICD-10-CM

## 2015-07-07 DIAGNOSIS — Z87891 Personal history of nicotine dependence: Secondary | ICD-10-CM | POA: Diagnosis not present

## 2015-07-07 DIAGNOSIS — Z5112 Encounter for antineoplastic immunotherapy: Secondary | ICD-10-CM | POA: Diagnosis not present

## 2015-07-07 LAB — CBC WITH DIFFERENTIAL/PLATELET
BASO%: 0.5 % (ref 0.0–2.0)
Basophils Absolute: 0 10*3/uL (ref 0.0–0.1)
EOS%: 1.8 % (ref 0.0–7.0)
Eosinophils Absolute: 0.1 10*3/uL (ref 0.0–0.5)
HEMATOCRIT: 36.3 % — AB (ref 38.4–49.9)
HGB: 11.8 g/dL — ABNORMAL LOW (ref 13.0–17.1)
LYMPH%: 20.8 % (ref 14.0–49.0)
MCH: 32.7 pg (ref 27.2–33.4)
MCHC: 32.5 g/dL (ref 32.0–36.0)
MCV: 100.6 fL — ABNORMAL HIGH (ref 79.3–98.0)
MONO#: 0.7 10*3/uL (ref 0.1–0.9)
MONO%: 10.3 % (ref 0.0–14.0)
NEUT#: 4.3 10*3/uL (ref 1.5–6.5)
NEUT%: 66.6 % (ref 39.0–75.0)
Platelets: 213 10*3/uL (ref 140–400)
RBC: 3.61 10*6/uL — ABNORMAL LOW (ref 4.20–5.82)
RDW: 14.2 % (ref 11.0–14.6)
WBC: 6.5 10*3/uL (ref 4.0–10.3)
lymph#: 1.4 10*3/uL (ref 0.9–3.3)

## 2015-07-07 LAB — COMPREHENSIVE METABOLIC PANEL (CC13)
ALK PHOS: 79 U/L (ref 40–150)
ALT: 12 U/L (ref 0–55)
ANION GAP: 9 meq/L (ref 3–11)
AST: 15 U/L (ref 5–34)
Albumin: 2.7 g/dL — ABNORMAL LOW (ref 3.5–5.0)
BUN: 9 mg/dL (ref 7.0–26.0)
CHLORIDE: 103 meq/L (ref 98–109)
CO2: 28 meq/L (ref 22–29)
CREATININE: 0.8 mg/dL (ref 0.7–1.3)
Calcium: 9.3 mg/dL (ref 8.4–10.4)
EGFR: 85 mL/min/{1.73_m2} — ABNORMAL LOW (ref >90)
GLUCOSE: 209 mg/dL — AB (ref 70–140)
Potassium: 3.4 mEq/L — ABNORMAL LOW (ref 3.5–5.1)
SODIUM: 139 meq/L (ref 136–145)
TOTAL PROTEIN: 6.9 g/dL (ref 6.4–8.3)
Total Bilirubin: 0.22 mg/dL (ref 0.20–1.20)

## 2015-07-07 LAB — LACTATE DEHYDROGENASE (CC13): LDH: 115 U/L — ABNORMAL LOW (ref 125–245)

## 2015-07-07 LAB — PHOSPHORUS: Phosphorus: 2.8 mg/dL (ref 2.5–4.6)

## 2015-07-07 LAB — MAGNESIUM (CC13): MAGNESIUM: 1.9 mg/dL (ref 1.5–2.5)

## 2015-07-07 MED ORDER — SODIUM CHLORIDE 0.9 % IV SOLN
240.0000 mg | Freq: Once | INTRAVENOUS | Status: AC
  Administered 2015-07-07: 240 mg via INTRAVENOUS
  Filled 2015-07-07: qty 24

## 2015-07-07 MED ORDER — SODIUM CHLORIDE 0.9 % IV SOLN
Freq: Once | INTRAVENOUS | Status: AC
  Administered 2015-07-07: 10:00:00 via INTRAVENOUS

## 2015-07-07 NOTE — Progress Notes (Signed)
BMS 370: Group A, Arm B-3 Cycle 3 Patient here today for Cycle 3 of Nivolumab treatment with labs and MD visit prior to tx appt. PRO's completed by patient prior to lab appointment with Marthann Schiller, research assistant .Met with patient and spouse in exam room with Dr. Julien Nordmann after resting vitals and weight obtained. O2 sat @ 98%, patient is maintaining his weight, today at 142.9. Patient reports a slight improvement in appetite and continues to drink one ensure with ice cream in the evening and states he will add another at lunch time to help with weight gain. Patient also reports slight improvement in energy. He does report having daily bowel movements since he has increased Senokot s to 4 tablets daily, two tabs in the morning and two tabs in the evening and denies having diarrhea. Patient reports he has not taken any miralax, magnesium citrate, metamucil or milk of magnesia d/t having positive effects with the Senokot. Patient states over all he is feeling better and denies any new concerns or symptoms. Patient also denies; pain, headaches, nausea, vomiting, swelling, bleeding, skin concerns. Pt continues to report SOB with exertion. Concomitant medications reviewed and pt reporting no new medicationss. Dr. Julien Nordmann reviewed patients labs and patient cleared for Cycle 3 today. Patient's potassium @ 3.4 and per MD, patient advised to increase potassium rich foods to his diet and patient was given suggestions of foods high in potassium. Patient and wife gave verbal understanding, all their questions answered to their satisfaction and were encouraged to call clinic with any questions/concerns.  Vial assignment given to [facility]. Infusion sign given to Armanda Magic, RN. Patient to return 08/03 for cycle 4. Adele Dan, RN, BSN  Clinical Research 07/07/2015 12:59 PM

## 2015-07-07 NOTE — Progress Notes (Signed)
[place] [phone]   Fax:(336) [place], MD [address] [place] [address]  DIAGNOSIS: Stage IV (T3, N3, M1b) non-small cell lung cancer, adenocarcinoma diagnosed in January 2016 presented with right upper lobe lung mass in addition to mediastinal lymphadenopathy and large right pleural effusion as well as bone metastasis.  PRIOR THERAPY:  1. Status post Pleurx catheter placement by Dr. Servando Snare for drainage of the right pleural effusion.  2. Systemic chemotherapy with carboplatin for AUC of 5 and Alimta 500 MG/M2 every 3 weeks. Status post 6 cycles.  CURRENT THERAPY: Patient is participating in the [place] 370 clinical trial and has been randomized to group A arm B-3 to receive Nivolumab 240 mg given every 2 weeks. First cycle expected 06/10/2015. Status post 2 cycles.  INTERVAL HISTORY: Omar Peters 80 y.o. male returns to the clinic today for follow-up visit accompanied by his wife. The patient is feeling fine today with no specific complaints except for lack of appetite. His weight is a stable. He denied having any significant chest pain, shortness breath, cough or hemoptysis. He is tolerating his treatment with Nivolumab fairly well with no significant adverse effects. The patient denied having any significant skin rash, diarrhea but has occasional constipation and he is currently on Senokot-S 2 tablets twice a day. He has no nausea or vomiting. No significant headache or visual changes. He is here today for cycle #3 of his immunotherapy.  MEDICAL HISTORY: Past Medical History  Diagnosis Date  . Hypertension   . GERD (gastroesophageal reflux disease)   . Shortness of breath dyspnea     01/14/15 at night and has to sit up  . Anxiety     due to diagnosis of Stage 4 Lung Cancer  . Macular degeneration of right eye   . Cancer     Lung  . Diabetes mellitus without complication     ALLERGIES:  is  allergic to marinol.  MEDICATIONS:  Current Outpatient Prescriptions  Medication Sig Dispense Refill  . amitriptyline (ELAVIL) 10 MG tablet Take 10 mg by mouth at bedtime.    Marland Kitchen aspirin 81 MG tablet Take 81 mg by mouth daily.    . folic acid (FOLVITE) 1 MG tablet Take 1 tablet (1 mg total) by mouth daily. 90 tablet 0  . metFORMIN (GLUCOPHAGE-XR) 500 MG 24 hr tablet Take 500 mg by mouth daily with breakfast.    . ranitidine (ZANTAC) 150 MG tablet Take 150 mg by mouth daily.    Marland Kitchen senna-docusate (SENNA S) 8.6-50 MG per tablet Take 2 tablets by mouth daily.    Marland Kitchen HYDROcodone-acetaminophen (NORCO) 5-325 MG per tablet Take 1 tablet by mouth every 6 (six) hours as needed for moderate pain. (Patient not taking: Reported on 06/24/2015) 30 tablet 0  . magnesium hydroxide (MILK OF MAGNESIA) 400 MG/5ML suspension Take by mouth daily as needed for mild constipation.    . prochlorperazine (COMPAZINE) 10 MG tablet Take 1 tablet (10 mg total) by mouth every 6 (six) hours as needed for nausea or vomiting. (Patient not taking: Reported on 06/24/2015) 30 tablet 0   No current facility-administered medications for this visit.    SURGICAL HISTORY:  Past Surgical History  Procedure Laterality Date  . Vein ligation and stripping  1977  . Chest tube insertion Right 01/15/2015    Procedure: INSERTION PLEURAL DRAINAGE CATHETER WITH ULTRASOUND AND FLURO GUIDANCE;  Surgeon: Grace Isaac, MD;  Location: [facility];  Service: Thoracic;  Laterality: Right;  . Removal of pleural drainage catheter Right 03/26/2015    Procedure: REMOVAL OF PLEURAL DRAINAGE CATHETER;  Surgeon: Grace Isaac, MD;  Location: [facility];  Service: Thoracic;  Laterality: Right;    REVIEW OF SYSTEMS:  A comprehensive review of systems was negative except for: Constitutional: positive for anorexia Gastrointestinal: positive for constipation   PHYSICAL EXAMINATION: General appearance: alert, cooperative and no distress Head: Normocephalic, without  obvious abnormality, atraumatic Neck: no adenopathy, no JVD, supple, symmetrical, trachea midline and thyroid not enlarged, symmetric, no tenderness/mass/nodules Lymph nodes: Cervical, supraclavicular, and axillary nodes normal. Resp: clear to auscultation bilaterally Back: symmetric, no curvature. ROM normal. No CVA tenderness. Cardio: regular rate and rhythm, S1, S2 normal, no murmur, click, rub or gallop GI: soft, non-tender; bowel sounds normal; no masses,  no organomegaly Extremities: extremities normal, atraumatic, no cyanosis or edema  ECOG PERFORMANCE STATUS: 1 - Symptomatic but completely ambulatory  Blood pressure 155/74, pulse 96, temperature 97.7 F (36.5 C), temperature source Oral, resp. rate 16, height '5\' 6"'$  (1.676 m), weight 142 lb 14.4 oz (64.819 kg), SpO2 98 %.  LABORATORY DATA: Lab Results  Component Value Date   WBC 6.5 07/07/2015   HGB 11.8* 07/07/2015   HCT 36.3* 07/07/2015   MCV 100.6* 07/07/2015   PLT 213 07/07/2015      Chemistry      Component Value Date/Time   NA 139 07/07/2015 0847   NA 138 01/15/2015 0608   K 3.4* 07/07/2015 0847   K 3.5 01/15/2015 0608   CL 97 01/15/2015 0608   CO2 28 07/07/2015 0847   CO2 34* 01/15/2015 0608   BUN 9.0 07/07/2015 0847   BUN 13 01/15/2015 0608   CREATININE 0.8 07/07/2015 0847   CREATININE 0.96 01/15/2015 0608   GLU 231* 03/10/2015 1539      Component Value Date/Time   CALCIUM 9.3 07/07/2015 0847   CALCIUM 9.4 01/15/2015 0608   ALKPHOS 79 07/07/2015 0847   ALKPHOS 102 01/15/2015 0608   AST 15 07/07/2015 0847   AST 42* 01/15/2015 0608   ALT 12 07/07/2015 0847   ALT 38 01/15/2015 0608   BILITOT 0.22 07/07/2015 0847   BILITOT 0.8 01/15/2015 7673       RADIOGRAPHIC STUDIES: No results found.  ASSESSMENT AND PLAN: This is a very pleasant 80 years old white male with history of stage IV non-small cell lung cancer, adenocarcinoma status post induction chemotherapy was carboplatin and Alimta and currently  undergoing treatment with immunotherapy according to the BMS [place] 370 clinical trial. He was randomized to the Nivolumab arm 240 mg IV every 2 weeks. He is status post 2 cycles and tolerating his treatment fairly well. I recommended for the patient to proceed with cycle #3 today as scheduled. He would come back for follow-up visit in 2 weeks for reevaluation and management of any adverse effect of his treatment. The patient was advised to call immediately if he has any concerning symptoms in the interval. The patient voices understanding of current disease status and treatment options and is in agreement with the current care plan.  All questions were answered. The patient knows to call the clinic with any problems, questions or concerns. We can certainly see the patient much sooner if necessary.  Disclaimer: This note was dictated with voice recognition software. Similar sounding words can inadvertently be transcribed and may not be corrected upon review.

## 2015-07-07 NOTE — Patient Instructions (Signed)
Nivolumab injection What is this medicine? NIVOLUMAB (nye VOL ue mab) is used to treat certain types of melanoma and lung cancer. This medicine may be used for other purposes; ask your health care provider or pharmacist if you have questions. COMMON BRAND NAME(S): Opdivo What should I tell my health care provider before I take this medicine? They need to know if you have any of these conditions: -eye disease, vision problems -history of pancreatitis -immune system problems -inflammatory bowel disease -kidney disease -liver disease -lung disease -lupus -myasthenia gravis -multiple sclerosis -organ transplant -stomach or intestine problems -thyroid disease -tingling of the fingers or toes, or other nerve disorder -an unusual or allergic reaction to nivolumab, other medicines, foods, dyes, or preservatives -pregnant or trying to get pregnant -breast-feeding How should I use this medicine? This medicine is for infusion into a vein. It is given by a health care professional in a hospital or clinic setting. A special MedGuide will be given to you before each treatment. Be sure to read this information carefully each time. Talk to your pediatrician regarding the use of this medicine in children. Special care may be needed. Overdosage: If you think you've taken too much of this medicine contact a poison control center or emergency room at once. Overdosage: If you think you have taken too much of this medicine contact a poison control center or emergency room at once. NOTE: This medicine is only for you. Do not share this medicine with others. What if I miss a dose? It is important not to miss your dose. Call your doctor or health care professional if you are unable to keep an appointment. What may interact with this medicine? Interactions have not been studied. This list may not describe all possible interactions. Give your health care provider a list of all the medicines, herbs,  non-prescription drugs, or dietary supplements you use. Also tell them if you smoke, drink alcohol, or use illegal drugs. Some items may interact with your medicine. What should I watch for while using this medicine? Tell your doctor or healthcare professional if your symptoms do not start to get better or if they get worse. Your condition will be monitored carefully while you are receiving this medicine. You may need blood work done while you are taking this medicine. What side effects may I notice from receiving this medicine? Side effects that you should report to your doctor or health care professional as soon as possible: -allergic reactions like skin rash, itching or hives, swelling of the face, lips, or tongue -black, tarry stools -bloody or watery diarrhea -changes in vision -chills -cough -depressed mood -eye pain -feeling anxious -fever -general ill feeling or flu-like symptoms -hair loss -loss of appetite -low blood counts - this medicine may decrease the number of white blood cells, red blood cells and platelets. You may be at increased risk for infections and bleeding -pain, tingling, numbness in the hands or feet -redness, blistering, peeling or loosening of the skin, including inside the mouth -red pinpoint spots on skin -signs of decreased platelets or bleeding - bruising, pinpoint red spots on the skin, black, tarry stools, blood in the urine -signs of decreased red blood cells - unusually weak or tired, feeling faint or lightheaded, falls -signs of infection - fever or chills, cough, sore throat, pain or trouble passing urine -signs and symptoms of a dangerous change in heartbeat or heart rhythm like chest pain; dizziness; fast or irregular heartbeat; palpitations; feeling faint or lightheaded, falls; breathing problems -signs   and symptoms of high blood sugar such as dizziness; dry mouth; dry skin; fruity breath; nausea; stomach pain; increased hunger or thirst; increased  urination -signs and symptoms of kidney injury like trouble passing urine or change in the amount of urine -signs and symptoms of liver injury like dark yellow or brown urine; general ill feeling or flu-like symptoms; light-colored stools; loss of appetite; nausea; right upper belly pain; unusually weak or tired; yellowing of the eyes or skin -signs and symptoms of increased potassium like muscle weakness; chest pain; or fast, irregular heartbeat -signs and symptoms of low potassium like muscle cramps or muscle pain; chest pain; dizziness; feeling faint or lightheaded, falls; palpitations; breathing problems; or fast, irregular heartbeat -swelling of the ankles, feet, hands -weight gainSide effects that usually do not require medical attention (report to your doctor or health care professional if they continue or are bothersome): -constipation -general ill feeling or flu-like symptoms -hair loss -loss of appetite -nausea, vomiting This list may not describe all possible side effects. Call your doctor for medical advice about side effects. You may report side effects to FDA at 1-800-FDA-1088. Where should I keep my medicine? This drug is given in a hospital or clinic and will not be stored at home. NOTE: This sheet is a summary. It may not cover all possible information. If you have questions about this medicine, talk to your doctor, pharmacist, or health care provider.  2015, [employer]. (2014-02-23 13:18:19)  

## 2015-07-07 NOTE — Telephone Encounter (Signed)
Gave adn printed appt sched and avs for Aug

## 2015-07-07 NOTE — Progress Notes (Signed)
BMS [id number] - questionnaires (PROs) - patient into the cancer center for routine visit. Patient was given PROs upon arrival to the cancer center. The patient completed his PROs (EQ-5D-3L first and then FACT-L) before any study procedures were performed. I checked the PROs for completeness.  Patient's research blood was drawn 06/10/2015.  The patient was thanked for his continued support of this clinical trial. Barb Khaniya Tenaglia 07/07/2015 9:06 AM

## 2015-07-20 ENCOUNTER — Other Ambulatory Visit: Payer: Self-pay | Admitting: Medical Oncology

## 2015-07-20 DIAGNOSIS — C3411 Malignant neoplasm of upper lobe, right bronchus or lung: Secondary | ICD-10-CM

## 2015-07-21 ENCOUNTER — Encounter: Payer: Self-pay | Admitting: Internal Medicine

## 2015-07-21 ENCOUNTER — Ambulatory Visit (HOSPITAL_BASED_OUTPATIENT_CLINIC_OR_DEPARTMENT_OTHER): Payer: Medicare Other

## 2015-07-21 ENCOUNTER — Telehealth: Payer: Self-pay | Admitting: *Deleted

## 2015-07-21 ENCOUNTER — Telehealth: Payer: Self-pay | Admitting: Nurse Practitioner

## 2015-07-21 ENCOUNTER — Other Ambulatory Visit (HOSPITAL_COMMUNITY)
Admission: RE | Admit: 2015-07-21 | Discharge: 2015-07-21 | Disposition: A | Discharge status: Home / Self Care | Payer: Medicare Other | Source: Ambulatory Visit | Attending: Internal Medicine | Admitting: Internal Medicine

## 2015-07-21 ENCOUNTER — Other Ambulatory Visit (HOSPITAL_BASED_OUTPATIENT_CLINIC_OR_DEPARTMENT_OTHER): Payer: Medicare Other

## 2015-07-21 ENCOUNTER — Encounter: Payer: Self-pay | Admitting: Medical Oncology

## 2015-07-21 ENCOUNTER — Ambulatory Visit (HOSPITAL_BASED_OUTPATIENT_CLINIC_OR_DEPARTMENT_OTHER): Payer: Medicare Other | Admitting: Nurse Practitioner

## 2015-07-21 VITALS — BP 157/80 | HR 99 | Temp 98.1°F | Resp 18 | Ht 66.0 in | Wt 142.4 lb

## 2015-07-21 DIAGNOSIS — C3411 Malignant neoplasm of upper lobe, right bronchus or lung: Secondary | ICD-10-CM

## 2015-07-21 DIAGNOSIS — C7951 Secondary malignant neoplasm of bone: Secondary | ICD-10-CM

## 2015-07-21 DIAGNOSIS — Z006 Encounter for examination for normal comparison and control in clinical research program: Secondary | ICD-10-CM | POA: Diagnosis not present

## 2015-07-21 DIAGNOSIS — Z5112 Encounter for antineoplastic immunotherapy: Secondary | ICD-10-CM

## 2015-07-21 DIAGNOSIS — Z79899 Other long term (current) drug therapy: Secondary | ICD-10-CM | POA: Diagnosis not present

## 2015-07-21 LAB — RESEARCH LABS

## 2015-07-21 LAB — COMPREHENSIVE METABOLIC PANEL (CC13)
ALT: 15 U/L (ref 0–55)
AST: 19 U/L (ref 5–34)
Albumin: 2.9 g/dL — ABNORMAL LOW (ref 3.5–5.0)
Alkaline Phosphatase: 88 U/L (ref 40–150)
Anion Gap: 7 mEq/L (ref 3–11)
BUN: 8 mg/dL (ref 7.0–26.0)
CHLORIDE: 101 meq/L (ref 98–109)
CO2: 29 mEq/L (ref 22–29)
Calcium: 9.6 mg/dL (ref 8.4–10.4)
Creatinine: 0.7 mg/dL (ref 0.7–1.3)
EGFR: >90 mL/min/{1.73_m2} (ref >90)
GLUCOSE: 111 mg/dL (ref 70–140)
Potassium: 3.6 mEq/L (ref 3.5–5.1)
SODIUM: 137 meq/L (ref 136–145)
TOTAL PROTEIN: 7.4 g/dL (ref 6.4–8.3)
Total Bilirubin: 0.26 mg/dL (ref 0.20–1.20)

## 2015-07-21 LAB — CBC WITH DIFFERENTIAL/PLATELET
BASO%: 1.4 % (ref 0.0–2.0)
BASOS ABS: 0.1 10*3/uL (ref 0.0–0.1)
EOS ABS: 0.1 10*3/uL (ref 0.0–0.5)
EOS%: 1.5 % (ref 0.0–7.0)
HCT: 38.1 % — ABNORMAL LOW (ref 38.4–49.9)
HGB: 12.6 g/dL — ABNORMAL LOW (ref 13.0–17.1)
LYMPH%: 27 % (ref 14.0–49.0)
MCH: 32.8 pg (ref 27.2–33.4)
MCHC: 33.2 g/dL (ref 32.0–36.0)
MCV: 99 fL — ABNORMAL HIGH (ref 79.3–98.0)
MONO#: 0.9 10*3/uL (ref 0.1–0.9)
MONO%: 12.9 % (ref 0.0–14.0)
NEUT%: 57.2 % (ref 39.0–75.0)
NEUTROS ABS: 4 10*3/uL (ref 1.5–6.5)
Platelets: 241 10*3/uL (ref 140–400)
RBC: 3.85 10*6/uL — ABNORMAL LOW (ref 4.20–5.82)
RDW: 14.7 % — AB (ref 11.0–14.6)
WBC: 6.9 10*3/uL (ref 4.0–10.3)
lymph#: 1.9 10*3/uL (ref 0.9–3.3)

## 2015-07-21 LAB — MAGNESIUM (CC13): Magnesium: 2.1 mg/dl (ref 1.5–2.5)

## 2015-07-21 LAB — LACTATE DEHYDROGENASE (CC13): LDH: 120 U/L — ABNORMAL LOW (ref 125–245)

## 2015-07-21 LAB — TSH CHCC: TSH: 1.834 m(IU)/L (ref 0.320–4.118)

## 2015-07-21 LAB — PHOSPHORUS: PHOSPHORUS: 3.4 mg/dL (ref 2.5–4.6)

## 2015-07-21 MED ORDER — INV-NIVOLUMAB CHEMO INJECTION 100 MG/10 ML BMS CA 209-370
240.0000 mg | Freq: Once | INTRAVENOUS | Status: AC
  Administered 2015-07-21: 240 mg via INTRAVENOUS
  Filled 2015-07-21: qty 24

## 2015-07-21 MED ORDER — SODIUM CHLORIDE 0.9 % IV SOLN
Freq: Once | INTRAVENOUS | Status: AC
  Administered 2015-07-21: 14:00:00 via INTRAVENOUS

## 2015-07-21 NOTE — Telephone Encounter (Signed)
Per staff message and POF I have scheduled appts. Advised scheduler of appts. JMW  

## 2015-07-21 NOTE — Patient Instructions (Signed)
Nivolumab injection What is this medicine? NIVOLUMAB (nye VOL ue mab) is used to treat certain types of melanoma and lung cancer. This medicine may be used for other purposes; ask your health care provider or pharmacist if you have questions. COMMON BRAND NAME(S): Opdivo What should I tell my health care provider before I take this medicine? They need to know if you have any of these conditions: -eye disease, vision problems -history of pancreatitis -immune system problems -inflammatory bowel disease -kidney disease -liver disease -lung disease -lupus -myasthenia gravis -multiple sclerosis -organ transplant -stomach or intestine problems -thyroid disease -tingling of the fingers or toes, or other nerve disorder -an unusual or allergic reaction to nivolumab, other medicines, foods, dyes, or preservatives -pregnant or trying to get pregnant -breast-feeding How should I use this medicine? This medicine is for infusion into a vein. It is given by a health care professional in a hospital or clinic setting. A special MedGuide will be given to you before each treatment. Be sure to read this information carefully each time. Talk to your pediatrician regarding the use of this medicine in children. Special care may be needed. Overdosage: If you think you've taken too much of this medicine contact a poison control center or emergency room at once. Overdosage: If you think you have taken too much of this medicine contact a poison control center or emergency room at once. NOTE: This medicine is only for you. Do not share this medicine with others. What if I miss a dose? It is important not to miss your dose. Call your doctor or health care professional if you are unable to keep an appointment. What may interact with this medicine? Interactions have not been studied. This list may not describe all possible interactions. Give your health care provider a list of all the medicines, herbs,  non-prescription drugs, or dietary supplements you use. Also tell them if you smoke, drink alcohol, or use illegal drugs. Some items may interact with your medicine. What should I watch for while using this medicine? Tell your doctor or healthcare professional if your symptoms do not start to get better or if they get worse. Your condition will be monitored carefully while you are receiving this medicine. You may need blood work done while you are taking this medicine. What side effects may I notice from receiving this medicine? Side effects that you should report to your doctor or health care professional as soon as possible: -allergic reactions like skin rash, itching or hives, swelling of the face, lips, or tongue -black, tarry stools -bloody or watery diarrhea -changes in vision -chills -cough -depressed mood -eye pain -feeling anxious -fever -general ill feeling or flu-like symptoms -hair loss -loss of appetite -low blood counts - this medicine may decrease the number of white blood cells, red blood cells and platelets. You may be at increased risk for infections and bleeding -pain, tingling, numbness in the hands or feet -redness, blistering, peeling or loosening of the skin, including inside the mouth -red pinpoint spots on skin -signs of decreased platelets or bleeding - bruising, pinpoint red spots on the skin, black, tarry stools, blood in the urine -signs of decreased red blood cells - unusually weak or tired, feeling faint or lightheaded, falls -signs of infection - fever or chills, cough, sore throat, pain or trouble passing urine -signs and symptoms of a dangerous change in heartbeat or heart rhythm like chest pain; dizziness; fast or irregular heartbeat; palpitations; feeling faint or lightheaded, falls; breathing problems -signs   and symptoms of high blood sugar such as dizziness; dry mouth; dry skin; fruity breath; nausea; stomach pain; increased hunger or thirst; increased  urination -signs and symptoms of kidney injury like trouble passing urine or change in the amount of urine -signs and symptoms of liver injury like dark yellow or brown urine; general ill feeling or flu-like symptoms; light-colored stools; loss of appetite; nausea; right upper belly pain; unusually weak or tired; yellowing of the eyes or skin -signs and symptoms of increased potassium like muscle weakness; chest pain; or fast, irregular heartbeat -signs and symptoms of low potassium like muscle cramps or muscle pain; chest pain; dizziness; feeling faint or lightheaded, falls; palpitations; breathing problems; or fast, irregular heartbeat -swelling of the ankles, feet, hands -weight gainSide effects that usually do not require medical attention (report to your doctor or health care professional if they continue or are bothersome): -constipation -general ill feeling or flu-like symptoms -hair loss -loss of appetite -nausea, vomiting This list may not describe all possible side effects. Call your doctor for medical advice about side effects. You may report side effects to FDA at 1-800-FDA-1088. Where should I keep my medicine? This drug is given in a hospital or clinic and will not be stored at home. NOTE: This sheet is a summary. It may not cover all possible information. If you have questions about this medicine, talk to your doctor, pharmacist, or health care provider.  2015, [employer]. (2014-02-23 13:18:19)  

## 2015-07-21 NOTE — Progress Notes (Signed)
BMS 370: Group A, Arm B3: Cycle 4/week 6 Met with patient and spouse today in exam room for cycle 4 of Nivolumab treatment. PRO's completed with research assistant, Marthann Schiller prior to lab appointment. Resting vitals obtained, O2 saturation @ 98%, patient maintaining weight at 142.6 lbs. Patient reports no new medications or changes to his current medications. Patient denies: nausea, vomiting, and pain. Patient denies bowel or bladder concerns, patient continues to report energy level and appetite unchanged and SOB with exdertion. Patient states he continues to drink ensure with ice cream once or twice daily. Patient denies bleeding or skin concerns, as well as any neurological issues. No edema/swelling present. Patient reports no new symptoms. Per NP, [name]' assessment and review of labs, as well as non clinically significant labs, patient ok to proceed with Cycle 4 today. Patient informed of upcoming CT to be scheduled for August 24 or 25th and that he will be contacted by scheduling for that appointment. All patient's and spouses questions answered to their satisfaction. Patient encouraged to call clinic with any questions/concerns. F/U appt 08/17 Adele Dan, RN, BSN Clinical Research 07/21/2015 3:01 PM

## 2015-07-21 NOTE — Progress Notes (Signed)
BMS [id number] - questionnaires (PROs) - patient into the cancer center for routine visit. Patient was given PROs upon arrival to the cancer center. The patient completed his PROs (EQ-5D-3L first and then FACT-L) before any study procedures were performed. I checked the PROs for completeness.  Research blood was drawn.  The patient was thanked for his continued support of this clinical trial. Barb Brylie Sneath 07/21/2015 1:14 PM

## 2015-07-21 NOTE — Progress Notes (Signed)
  [facility] OFFICE PROGRESS NOTE   DIAGNOSIS: Stage IV (T3, N3, M1b) non-small cell lung cancer, adenocarcinoma diagnosed in January 2016 presented with right upper lobe lung mass in addition to mediastinal lymphadenopathy and large right pleural effusion as well as bone metastasis.  PRIOR THERAPY:  1. Status post Pleurx catheter placement by Dr. Servando Snare for drainage of the right pleural effusion.  2. Systemic chemotherapy with carboplatin for AUC of 5 and Alimta 500 MG/M2 every 3 weeks. Status post 6 cycles.  CURRENT THERAPY: Patient is participating in the [place] 370 clinical trial and has been randomized to group A arm B-3 to receive Nivolumab 240 mg given every 2 weeks. First cycle 06/10/2015. Status post 3 cycles.     INTERVAL HISTORY:   Omar Peters returns as scheduled. He completed cycle 3 nivolumab 07/07/2015. He denies nausea/vomiting. No mouth sores. No diarrhea. No rash. Appetite continues to be poor. His weight remains stable. Stable baseline dyspnea on exertion. No cough. No fever. He denies bleeding.  Objective:  Vital signs in last 24 hours:  Blood pressure 157/80, pulse 99, temperature 98.1 F (36.7 C), temperature source Oral, resp. rate 18, height '5\' 6"'$  (1.676 m), weight 142 lb 6.4 oz (64.592 kg).    HEENT: No thrush or ulcers. Resp: Lungs clear bilaterally. Cardio: Regular rate and rhythm. GI: Abdomen soft and nontender. No hepatomegaly. Vascular: No leg edema. Neuro: Alert and oriented.  Skin: No rash.    Lab Results:  Lab Results  Component Value Date   WBC 6.9 07/21/2015   HGB 12.6* 07/21/2015   HCT 38.1* 07/21/2015   MCV 99.0* 07/21/2015   PLT 241 07/21/2015   NEUTROABS 4.0 07/21/2015    Imaging:  No results found.  Medications: I have reviewed the patient's current medications.  Assessment/Plan: 1. Stage IV non-small cell lung cancer, adenocarcinoma status post induction chemotherapy was carboplatin and Alimta and  currently undergoing treatment with immunotherapy according to the BMS checkmate 370 clinical trial. He was randomized to the Nivolumab arm 240 mg IV every 2 weeks. He is status post 3 cycles    Disposition: Omar Peters appears stable. He has completed 3 cycles of nivolumab. Plan to proceed with cycle 4 today as scheduled. He will return for a follow-up visit and cycle 5 in 2 weeks. He will contact the office in the interim with any problems.  ECOG performance status measured at 1 today.  Omar Peters ANP/GNP-BC   07/21/2015  12:35 PM

## 2015-07-21 NOTE — Telephone Encounter (Signed)
Pt confirmed labs/ov per 08/03 POF, gave pt avs and calendar.Cherylann Banas, sent msg to add chemo

## 2015-07-30 ENCOUNTER — Other Ambulatory Visit: Payer: Self-pay | Admitting: Medical Oncology

## 2015-07-30 DIAGNOSIS — C3411 Malignant neoplasm of upper lobe, right bronchus or lung: Secondary | ICD-10-CM

## 2015-08-02 ENCOUNTER — Encounter: Payer: Self-pay | Admitting: Medical Oncology

## 2015-08-02 DIAGNOSIS — C3411 Malignant neoplasm of upper lobe, right bronchus or lung: Secondary | ICD-10-CM

## 2015-08-03 ENCOUNTER — Other Ambulatory Visit: Payer: Self-pay | Admitting: Medical Oncology

## 2015-08-04 ENCOUNTER — Encounter: Payer: Self-pay | Admitting: Medical Oncology

## 2015-08-04 ENCOUNTER — Other Ambulatory Visit (HOSPITAL_COMMUNITY)
Admission: RE | Admit: 2015-08-04 | Discharge: 2015-08-04 | Disposition: A | Discharge status: Home / Self Care | Payer: Medicare Other | Source: Ambulatory Visit | Attending: Internal Medicine | Admitting: Internal Medicine

## 2015-08-04 ENCOUNTER — Telehealth: Payer: Self-pay | Admitting: *Deleted

## 2015-08-04 ENCOUNTER — Ambulatory Visit (HOSPITAL_BASED_OUTPATIENT_CLINIC_OR_DEPARTMENT_OTHER): Payer: Medicare Other | Admitting: Physician Assistant

## 2015-08-04 ENCOUNTER — Encounter: Payer: Self-pay | Admitting: Internal Medicine

## 2015-08-04 ENCOUNTER — Ambulatory Visit (HOSPITAL_BASED_OUTPATIENT_CLINIC_OR_DEPARTMENT_OTHER): Payer: Medicare Other

## 2015-08-04 ENCOUNTER — Telehealth: Payer: Self-pay | Admitting: Physician Assistant

## 2015-08-04 ENCOUNTER — Encounter: Payer: Self-pay | Admitting: Physician Assistant

## 2015-08-04 ENCOUNTER — Other Ambulatory Visit (HOSPITAL_BASED_OUTPATIENT_CLINIC_OR_DEPARTMENT_OTHER): Payer: Medicare Other

## 2015-08-04 VITALS — BP 160/80 | HR 103 | Temp 98.2°F | Resp 18 | Ht 66.0 in | Wt 142.0 lb

## 2015-08-04 VITALS — BP 160/70 | HR 96 | Temp 97.6°F | Resp 18

## 2015-08-04 DIAGNOSIS — C7951 Secondary malignant neoplasm of bone: Secondary | ICD-10-CM | POA: Diagnosis not present

## 2015-08-04 DIAGNOSIS — Z5112 Encounter for antineoplastic immunotherapy: Secondary | ICD-10-CM | POA: Diagnosis not present

## 2015-08-04 DIAGNOSIS — Z006 Encounter for examination for normal comparison and control in clinical research program: Secondary | ICD-10-CM | POA: Diagnosis not present

## 2015-08-04 DIAGNOSIS — C3411 Malignant neoplasm of upper lobe, right bronchus or lung: Secondary | ICD-10-CM

## 2015-08-04 DIAGNOSIS — Z87891 Personal history of nicotine dependence: Secondary | ICD-10-CM | POA: Diagnosis not present

## 2015-08-04 LAB — CBC WITH DIFFERENTIAL/PLATELET
BASO%: 0.2 % (ref 0.0–2.0)
BASOS ABS: 0 10*3/uL (ref 0.0–0.1)
EOS%: 2.6 % (ref 0.0–7.0)
Eosinophils Absolute: 0.2 10*3/uL (ref 0.0–0.5)
HCT: 39.8 % (ref 38.4–49.9)
HEMOGLOBIN: 13.1 g/dL (ref 13.0–17.1)
LYMPH%: 24 % (ref 14.0–49.0)
MCH: 32.3 pg (ref 27.2–33.4)
MCHC: 32.9 g/dL (ref 32.0–36.0)
MCV: 98.3 fL — ABNORMAL HIGH (ref 79.3–98.0)
MONO#: 0.8 10*3/uL (ref 0.1–0.9)
MONO%: 11.1 % (ref 0.0–14.0)
NEUT#: 4.2 10*3/uL (ref 1.5–6.5)
NEUT%: 62.1 % (ref 39.0–75.0)
Platelets: 219 10*3/uL (ref 140–400)
RBC: 4.05 10*6/uL — AB (ref 4.20–5.82)
RDW: 14.5 % (ref 11.0–14.6)
WBC: 6.8 10*3/uL (ref 4.0–10.3)
lymph#: 1.6 10*3/uL (ref 0.9–3.3)

## 2015-08-04 LAB — COMPREHENSIVE METABOLIC PANEL (CC13)
ALBUMIN: 2.9 g/dL — AB (ref 3.5–5.0)
ALK PHOS: 90 U/L (ref 40–150)
ALT: 15 U/L (ref 0–55)
AST: 17 U/L (ref 5–34)
Anion Gap: 8 mEq/L (ref 3–11)
BUN: 7.4 mg/dL (ref 7.0–26.0)
CALCIUM: 9.5 mg/dL (ref 8.4–10.4)
CHLORIDE: 101 meq/L (ref 98–109)
CO2: 28 mEq/L (ref 22–29)
Creatinine: 0.7 mg/dL (ref 0.7–1.3)
GLUCOSE: 137 mg/dL (ref 70–140)
POTASSIUM: 3.8 meq/L (ref 3.5–5.1)
SODIUM: 137 meq/L (ref 136–145)
Total Bilirubin: 0.22 mg/dL (ref 0.20–1.20)
Total Protein: 7.2 g/dL (ref 6.4–8.3)

## 2015-08-04 LAB — LACTATE DEHYDROGENASE (CC13): LDH: 129 U/L (ref 125–245)

## 2015-08-04 LAB — MAGNESIUM (CC13): MAGNESIUM: 2 mg/dL (ref 1.5–2.5)

## 2015-08-04 LAB — PHOSPHORUS: Phosphorus: 3.4 mg/dL (ref 2.5–4.6)

## 2015-08-04 MED ORDER — SODIUM CHLORIDE 0.9 % IV SOLN
240.0000 mg | Freq: Once | INTRAVENOUS | Status: AC
  Administered 2015-08-04: 240 mg via INTRAVENOUS
  Filled 2015-08-04: qty 24

## 2015-08-04 MED ORDER — SODIUM CHLORIDE 0.9 % IV SOLN
Freq: Once | INTRAVENOUS | Status: AC
  Administered 2015-08-04: 14:00:00 via INTRAVENOUS

## 2015-08-04 NOTE — Progress Notes (Signed)
BMS 370: Group A, Arm B3: Cycle 5/week 8 Met with patient and spouse today in exam room for cycle 5 of Nivolumab treatment. PRO's completed with research assistant, Marthann Schiller prior to lab appointment. Resting vitals obtained, O2 saturation @ 98%, patient continues to maintain weight at 142 lbs. Shared visit with PA, Awilda Metro and Dr. Julien Nordmann today. Patient reports no new medications or changes to his current medications. Patient denies: nausea, vomiting, diarrhea and pain. Patient continues to report low energy level and decreased appetite, continues to be SOB with exertion. Patient states he continues to drink ensure with ice cream but only once daily. I encouraged patient to add more protein to his diet such as peanut butter and to try to add another ensure for added nutrition and protein, patient gave verbal understanding. Patient denies bleeding or rashes, and no neurological issues. Per PA Adrena Johnson's assessment, no edema/swelling present. Patient reports no new symptoms. Assessment and review of labs, as well as non clinically significant labs, by Dr. Julien Nordmann and per MD, patient ok to proceed with Cycle 5 today. Patient confirmed upcoming CT appointment for 08/24 and states he has been given the contrast to drink for prior to procedure.  All patient's and spouses questions answered to their satisfaction. Patient was encouraged to call clinic with any questions/concerns. Study drug Nivolumab vial assignment number sheet given to Pharm D Raul Del. Infusion sign given to Mayra Reel, RN.  Adele Dan, RN, BSN Clinical Research 08/04/2015 2:17 PM

## 2015-08-04 NOTE — Telephone Encounter (Signed)
Per staff message and POF I have scheduled appts. Advised scheduler of appts. JMW  

## 2015-08-04 NOTE — Progress Notes (Addendum)
[facility] Telephone:([phone]) [phone]   Fax:([phone]) [place], [place] [address] [place] [address]  DIAGNOSIS: Stage IV (T3, N3, M1b) non-small cell lung cancer, adenocarcinoma diagnosed in January 2016 presented with right upper lobe lung mass in addition to mediastinal lymphadenopathy and large right pleural effusion as well as bone metastasis.  PRIOR THERAPY:  1. Status post Pleurx catheter placement by Dr. Servando Snare for drainage of the right pleural effusion.  2. Systemic chemotherapy with carboplatin for AUC of 5 and Alimta 500 MG/M2 every 3 weeks. Status post 6 cycles.  CURRENT THERAPY: Patient is participating in the [place] 370 clinical trial and has been randomized to group A arm B-3 to receive Nivolumab 240 mg given every 2 weeks. First cycle expected 06/10/2015. Status post 4 cycles.  INTERVAL HISTORY: Omar Peters 80 y.o. male returns to the clinic today for follow-up visit accompanied by his wife. The patient is feeling fine today with no specific complaints except for lack of appetite. His weight is a stable. He denied having any significant chest pain, shortness breath, cough or hemoptysis. He is tolerating his treatment with Nivolumab fairly well with no significant adverse effects. The patient denied having any significant skin rash or diarrhea.  He has occasional constipation and he is currently on Senokot-S 2 tablets twice a day. He has no nausea or vomiting. No significant headache or visual changes. He is here today for cycle #5 of his immunotherapy.  MEDICAL HISTORY: Past Medical History  Diagnosis Date  . Hypertension   . GERD (gastroesophageal reflux disease)   . Shortness of breath dyspnea     01/14/15 at night and has to sit up  . Anxiety     due to diagnosis of Stage 4 Lung Cancer  . Macular degeneration of right eye   . Cancer     Lung  . Diabetes mellitus without complication     ALLERGIES:   is allergic to marinol.  MEDICATIONS:  Current Outpatient Prescriptions  Medication Sig Dispense Refill  . amitriptyline (ELAVIL) 10 MG tablet Take 10 mg by mouth at bedtime.    Marland Kitchen aspirin 81 MG tablet Take 81 mg by mouth daily.    . folic acid (FOLVITE) 1 MG tablet Take 1 tablet (1 mg total) by mouth daily. 90 tablet 0  . HYDROcodone-acetaminophen (NORCO) 5-325 MG per tablet Take 1 tablet by mouth every 6 (six) hours as needed for moderate pain. 30 tablet 0  . magnesium hydroxide (MILK OF MAGNESIA) 400 MG/5ML suspension Take by mouth daily as needed for mild constipation.    . metFORMIN (GLUCOPHAGE-XR) 500 MG 24 hr tablet Take 500 mg by mouth daily with breakfast.    . prochlorperazine (COMPAZINE) 10 MG tablet Take 1 tablet (10 mg total) by mouth every 6 (six) hours as needed for nausea or vomiting. 30 tablet 0  . ranitidine (ZANTAC) 150 MG tablet Take 150 mg by mouth daily.    Marland Kitchen senna-docusate (SENNA S) 8.6-50 MG per tablet Take 2 tablets by mouth daily.     No current facility-administered medications for this visit.    SURGICAL HISTORY:  Past Surgical History  Procedure Laterality Date  . Vein ligation and stripping  1977  . Chest tube insertion Right 01/15/2015    Procedure: INSERTION PLEURAL DRAINAGE CATHETER WITH ULTRASOUND AND FLURO GUIDANCE;  Surgeon: Grace Isaac, MD;  Location: [facility];  Service: Thoracic;  Laterality: Right;  .  Removal of pleural drainage catheter Right 03/26/2015    Procedure: REMOVAL OF PLEURAL DRAINAGE CATHETER;  Surgeon: Grace Isaac, MD;  Location: [facility];  Service: Thoracic;  Laterality: Right;    REVIEW OF SYSTEMS:  A comprehensive review of systems was negative except for: Constitutional: positive for anorexia Gastrointestinal: positive for constipation   PHYSICAL EXAMINATION: General appearance: alert, cooperative and no distress Head: Normocephalic, without obvious abnormality, atraumatic Neck: no adenopathy, no JVD, supple, symmetrical,  trachea midline and thyroid not enlarged, symmetric, no tenderness/mass/nodules Lymph nodes: Cervical, supraclavicular, and axillary nodes normal. Resp: clear to auscultation bilaterally Back: symmetric, no curvature. ROM normal. No CVA tenderness. Cardio: regular rate and rhythm, S1, S2 normal, no murmur, click, rub or gallop GI: soft, non-tender; bowel sounds normal; no masses,  no organomegaly Extremities: extremities normal, atraumatic, no cyanosis or edema  ECOG PERFORMANCE STATUS: 1 - Symptomatic but completely ambulatory  Blood pressure 160/80, pulse 103, temperature 98.2 F (36.8 C), temperature source Oral, resp. rate 18, height '5\' 6"'$  (1.676 m), weight 142 lb (64.411 kg), SpO2 98 %.  LABORATORY DATA: Lab Results  Component Value Date   WBC 6.8 08/04/2015   HGB 13.1 08/04/2015   HCT 39.8 08/04/2015   MCV 98.3* 08/04/2015   PLT 219 08/04/2015      Chemistry      Component Value Date/Time   NA 137 08/04/2015 1146   NA 138 01/15/2015 0608   K 3.8 08/04/2015 1146   K 3.5 01/15/2015 0608   CL 97 01/15/2015 0608   CO2 28 08/04/2015 1146   CO2 34* 01/15/2015 0608   BUN 7.4 08/04/2015 1146   BUN 13 01/15/2015 0608   CREATININE 0.7 08/04/2015 1146   CREATININE 0.96 01/15/2015 0608   GLU 231* 03/10/2015 1539      Component Value Date/Time   CALCIUM 9.5 08/04/2015 1146   CALCIUM 9.4 01/15/2015 0608   ALKPHOS 90 08/04/2015 1146   ALKPHOS 102 01/15/2015 0608   AST 17 08/04/2015 1146   AST 42* 01/15/2015 0608   ALT 15 08/04/2015 1146   ALT 38 01/15/2015 0608   BILITOT 0.22 08/04/2015 1146   BILITOT 0.8 01/15/2015 0608       RADIOGRAPHIC STUDIES: No results found.  ASSESSMENT AND PLAN: This is a very pleasant 80 years old white male with history of stage IV non-small cell lung cancer, adenocarcinoma status post induction chemotherapy was carboplatin and Alimta and currently undergoing treatment with immunotherapy according to the BMS checkmate 370 clinical trial. He  was randomized to the Nivolumab arm 240 mg IV every 2 weeks. He is status post 4 cycles and tolerating his treatment fairly well. I recommended for the patient to proceed with cycle #5 today as scheduled. He will return for a follow-up visit in 2 weeks for reevaluation and management of any adverse effect of his treatment. Patient was discussed with and also seen by Dr. Julien Nordmann. The patient was advised to call immediately if he has any concerning symptoms in the interval. The patient voices understanding of current disease status and treatment options and is in agreement with the current care plan.  All questions were answered. The patient knows to call the clinic with any problems, questions or concerns. We can certainly see the patient much sooner if necessary.  Carlton Adam, PA-C 08/04/2015   ADDENDUM: Hematology/Oncology Attending: I had a face to face encounter with the patient. I recommended his care plan. This is a very pleasant 80 years old white male  with a stage IV non-small cell lung cancer status post induction chemotherapy with carboplatin and Alimta and currently undergoing maintenance treatment according to the BMS checkmated 370 clinical trial with Nivolumab 240 MG IV every 2 weeks status post 4 cycles. The patient is tolerating his treatment fairly well with no significant adverse effects. We will proceed with cycle #5 today as a scheduled. He would come back for follow-up visit in 2 weeks for evaluation after repeating CT scan of the chest for restaging of his disease. For hypertension, I will strongly advised the patient to consult with his primary care physician for evaluation and management of his hypertension. He was advised to call immediately if he has any concerning symptoms in the interval.  Disclaimer: This note was dictated with voice recognition software. Similar sounding words can inadvertently be transcribed and may be missed upon review. Eilleen Kempf.,  MD 08/07/2015

## 2015-08-04 NOTE — Patient Instructions (Signed)
Nivolumab injection What is this medicine? NIVOLUMAB (nye VOL ue mab) is used to treat certain types of melanoma and lung cancer. This medicine may be used for other purposes; ask your health care provider or pharmacist if you have questions. COMMON BRAND NAME(S): Opdivo What should I tell my health care provider before I take this medicine? They need to know if you have any of these conditions: -eye disease, vision problems -history of pancreatitis -immune system problems -inflammatory bowel disease -kidney disease -liver disease -lung disease -lupus -myasthenia gravis -multiple sclerosis -organ transplant -stomach or intestine problems -thyroid disease -tingling of the fingers or toes, or other nerve disorder -an unusual or allergic reaction to nivolumab, other medicines, foods, dyes, or preservatives -pregnant or trying to get pregnant -breast-feeding How should I use this medicine? This medicine is for infusion into a vein. It is given by a health care professional in a hospital or clinic setting. A special MedGuide will be given to you before each treatment. Be sure to read this information carefully each time. Talk to your pediatrician regarding the use of this medicine in children. Special care may be needed. Overdosage: If you think you've taken too much of this medicine contact a poison control center or emergency room at once. Overdosage: If you think you have taken too much of this medicine contact a poison control center or emergency room at once. NOTE: This medicine is only for you. Do not share this medicine with others. What if I miss a dose? It is important not to miss your dose. Call your doctor or health care professional if you are unable to keep an appointment. What may interact with this medicine? Interactions have not been studied. This list may not describe all possible interactions. Give your health care provider a list of all the medicines, herbs,  non-prescription drugs, or dietary supplements you use. Also tell them if you smoke, drink alcohol, or use illegal drugs. Some items may interact with your medicine. What should I watch for while using this medicine? Tell your doctor or healthcare professional if your symptoms do not start to get better or if they get worse. Your condition will be monitored carefully while you are receiving this medicine. You may need blood work done while you are taking this medicine. What side effects may I notice from receiving this medicine? Side effects that you should report to your doctor or health care professional as soon as possible: -allergic reactions like skin rash, itching or hives, swelling of the face, lips, or tongue -black, tarry stools -bloody or watery diarrhea -changes in vision -chills -cough -depressed mood -eye pain -feeling anxious -fever -general ill feeling or flu-like symptoms -hair loss -loss of appetite -low blood counts - this medicine may decrease the number of white blood cells, red blood cells and platelets. You may be at increased risk for infections and bleeding -pain, tingling, numbness in the hands or feet -redness, blistering, peeling or loosening of the skin, including inside the mouth -red pinpoint spots on skin -signs of decreased platelets or bleeding - bruising, pinpoint red spots on the skin, black, tarry stools, blood in the urine -signs of decreased red blood cells - unusually weak or tired, feeling faint or lightheaded, falls -signs of infection - fever or chills, cough, sore throat, pain or trouble passing urine -signs and symptoms of a dangerous change in heartbeat or heart rhythm like chest pain; dizziness; fast or irregular heartbeat; palpitations; feeling faint or lightheaded, falls; breathing problems -signs   and symptoms of high blood sugar such as dizziness; dry mouth; dry skin; fruity breath; nausea; stomach pain; increased hunger or thirst; increased  urination -signs and symptoms of kidney injury like trouble passing urine or change in the amount of urine -signs and symptoms of liver injury like dark yellow or brown urine; general ill feeling or flu-like symptoms; light-colored stools; loss of appetite; nausea; right upper belly pain; unusually weak or tired; yellowing of the eyes or skin -signs and symptoms of increased potassium like muscle weakness; chest pain; or fast, irregular heartbeat -signs and symptoms of low potassium like muscle cramps or muscle pain; chest pain; dizziness; feeling faint or lightheaded, falls; palpitations; breathing problems; or fast, irregular heartbeat -swelling of the ankles, feet, hands -weight gainSide effects that usually do not require medical attention (report to your doctor or health care professional if they continue or are bothersome): -constipation -general ill feeling or flu-like symptoms -hair loss -loss of appetite -nausea, vomiting This list may not describe all possible side effects. Call your doctor for medical advice about side effects. You may report side effects to FDA at 1-800-FDA-1088. Where should I keep my medicine? This drug is given in a hospital or clinic and will not be stored at home. NOTE: This sheet is a summary. It may not cover all possible information. If you have questions about this medicine, talk to your doctor, pharmacist, or health care provider.  2015, [employer]. (2014-02-23 13:18:19)  

## 2015-08-04 NOTE — Telephone Encounter (Signed)
Gave appointment for august/september. Sent message to confirm appointment for [facility] & to schedule treatment.

## 2015-08-04 NOTE — Progress Notes (Signed)
BMS [id number] - questionnaires (PROs) - patient into the cancer center for routine visit. Patient was given PROs upon arrival to the cancer center. The patient completed his PROs (EQ-5D-3L first and then the FACT-L) before any study procedures were performed. I checked the PROs for completeness.  Patient's research blood was drawn 07/21/2015.  The patient was thanked for his continued support of this clinical trial. Ebony Hail 08/04/2015 12:34 PM

## 2015-08-05 ENCOUNTER — Telehealth: Payer: Self-pay | Admitting: Medical Oncology

## 2015-08-05 NOTE — Telephone Encounter (Signed)
BMS 370: Patient has a history of HTN and yesterday's blood pressure elevated to HTN grade 3, baseline @ grade 2. Reviewed with Dr. Julien Nordmann and per MD, patient to be referred to his PCP. I called and spoke with patient's spouse, patient has some difficulty hearing well on phone. Informed spouse that patient's b/p is elevated and he is currently not on any b/p medication and patient needs to contact his PCP for assessment of increased b/p. Spouse stated that she would contact Dr. Rex Kras, patient's PCP, as soon as she and I hung up the phone. I thanked her for prompt attention and encouraged her to contact me with any questions/concerns. Adele Dan, RN, BSN Clinical Research 08/05/2015 1:26 PM

## 2015-08-06 NOTE — Patient Instructions (Signed)
Follow-up in 2 weeks prior to next scheduled cycle of immunotherapy 

## 2015-08-11 ENCOUNTER — Ambulatory Visit (HOSPITAL_COMMUNITY)
Admission: RE | Admit: 2015-08-11 | Discharge: 2015-08-11 | Disposition: A | Discharge status: Home / Self Care | Payer: Medicare Other | Source: Ambulatory Visit | Attending: Nurse Practitioner | Admitting: Nurse Practitioner

## 2015-08-11 ENCOUNTER — Encounter (HOSPITAL_COMMUNITY): Payer: Self-pay

## 2015-08-11 DIAGNOSIS — M899 Disorder of bone, unspecified: Secondary | ICD-10-CM | POA: Diagnosis not present

## 2015-08-11 DIAGNOSIS — C7951 Secondary malignant neoplasm of bone: Secondary | ICD-10-CM

## 2015-08-11 DIAGNOSIS — Z08 Encounter for follow-up examination after completed treatment for malignant neoplasm: Secondary | ICD-10-CM | POA: Insufficient documentation

## 2015-08-11 DIAGNOSIS — C3411 Malignant neoplasm of upper lobe, right bronchus or lung: Secondary | ICD-10-CM | POA: Insufficient documentation

## 2015-08-11 MED ORDER — IOHEXOL 300 MG/ML  SOLN
100.0000 mL | Freq: Once | INTRAMUSCULAR | Status: AC | PRN
  Administered 2015-08-11: 100 mL via INTRAVENOUS

## 2015-08-17 ENCOUNTER — Other Ambulatory Visit: Payer: Self-pay | Admitting: Medical Oncology

## 2015-08-17 DIAGNOSIS — C3411 Malignant neoplasm of upper lobe, right bronchus or lung: Secondary | ICD-10-CM

## 2015-08-18 ENCOUNTER — Ambulatory Visit (HOSPITAL_BASED_OUTPATIENT_CLINIC_OR_DEPARTMENT_OTHER): Payer: Medicare Other

## 2015-08-18 ENCOUNTER — Other Ambulatory Visit (HOSPITAL_BASED_OUTPATIENT_CLINIC_OR_DEPARTMENT_OTHER): Payer: Medicare Other

## 2015-08-18 ENCOUNTER — Encounter: Payer: Self-pay | Admitting: Medical Oncology

## 2015-08-18 ENCOUNTER — Other Ambulatory Visit (HOSPITAL_COMMUNITY)
Admission: RE | Admit: 2015-08-18 | Discharge: 2015-08-18 | Disposition: A | Discharge status: Home / Self Care | Payer: Medicare Other | Source: Ambulatory Visit | Attending: Internal Medicine | Admitting: Internal Medicine

## 2015-08-18 ENCOUNTER — Encounter: Payer: Self-pay | Admitting: Internal Medicine

## 2015-08-18 ENCOUNTER — Ambulatory Visit (HOSPITAL_BASED_OUTPATIENT_CLINIC_OR_DEPARTMENT_OTHER): Payer: Medicare Other | Admitting: Internal Medicine

## 2015-08-18 VITALS — BP 158/85 | HR 107 | Temp 97.8°F | Resp 19 | Ht 66.0 in | Wt 142.1 lb

## 2015-08-18 DIAGNOSIS — C3410 Malignant neoplasm of upper lobe, unspecified bronchus or lung: Secondary | ICD-10-CM | POA: Diagnosis present

## 2015-08-18 DIAGNOSIS — Z006 Encounter for examination for normal comparison and control in clinical research program: Secondary | ICD-10-CM

## 2015-08-18 DIAGNOSIS — C7951 Secondary malignant neoplasm of bone: Secondary | ICD-10-CM

## 2015-08-18 DIAGNOSIS — C3411 Malignant neoplasm of upper lobe, right bronchus or lung: Secondary | ICD-10-CM

## 2015-08-18 DIAGNOSIS — Z5112 Encounter for antineoplastic immunotherapy: Secondary | ICD-10-CM

## 2015-08-18 LAB — COMPREHENSIVE METABOLIC PANEL (CC13)
ALBUMIN: 3 g/dL — AB (ref 3.5–5.0)
ALK PHOS: 93 U/L (ref 40–150)
ALT: 12 U/L (ref 0–55)
ANION GAP: 9 meq/L (ref 3–11)
AST: 16 U/L (ref 5–34)
BUN: 10.5 mg/dL (ref 7.0–26.0)
CALCIUM: 9.7 mg/dL (ref 8.4–10.4)
CO2: 27 mEq/L (ref 22–29)
CREATININE: 0.8 mg/dL (ref 0.7–1.3)
Chloride: 103 mEq/L (ref 98–109)
EGFR: 87 mL/min/{1.73_m2} — ABNORMAL LOW (ref >90)
Glucose: 159 mg/dl — ABNORMAL HIGH (ref 70–140)
POTASSIUM: 3.6 meq/L (ref 3.5–5.1)
Sodium: 138 mEq/L (ref 136–145)
Total Bilirubin: 0.24 mg/dL (ref 0.20–1.20)
Total Protein: 7.3 g/dL (ref 6.4–8.3)

## 2015-08-18 LAB — CBC WITH DIFFERENTIAL/PLATELET
BASO%: 0.2 % (ref 0.0–2.0)
Basophils Absolute: 0 10*3/uL (ref 0.0–0.1)
EOS ABS: 0.2 10*3/uL (ref 0.0–0.5)
EOS%: 2.6 % (ref 0.0–7.0)
HEMATOCRIT: 40.2 % (ref 38.4–49.9)
HGB: 13.4 g/dL (ref 13.0–17.1)
LYMPH#: 1.6 10*3/uL (ref 0.9–3.3)
LYMPH%: 22.4 % (ref 14.0–49.0)
MCH: 33 pg (ref 27.2–33.4)
MCHC: 33.4 g/dL (ref 32.0–36.0)
MCV: 99.1 fL — AB (ref 79.3–98.0)
MONO#: 0.7 10*3/uL (ref 0.1–0.9)
MONO%: 10 % (ref 0.0–14.0)
NEUT#: 4.7 10*3/uL (ref 1.5–6.5)
NEUT%: 64.8 % (ref 39.0–75.0)
PLATELETS: 226 10*3/uL (ref 140–400)
RBC: 4.06 10*6/uL — ABNORMAL LOW (ref 4.20–5.82)
RDW: 14.2 % (ref 11.0–14.6)
WBC: 7.2 10*3/uL (ref 4.0–10.3)

## 2015-08-18 LAB — PHOSPHORUS: PHOSPHORUS: 3.2 mg/dL (ref 2.5–4.6)

## 2015-08-18 LAB — MAGNESIUM (CC13): MAGNESIUM: 2.2 mg/dL (ref 1.5–2.5)

## 2015-08-18 LAB — LACTATE DEHYDROGENASE (CC13): LDH: 112 U/L — ABNORMAL LOW (ref 125–245)

## 2015-08-18 MED ORDER — SODIUM CHLORIDE 0.9 % IV SOLN
240.0000 mg | Freq: Once | INTRAVENOUS | Status: AC
  Administered 2015-08-18: 240 mg via INTRAVENOUS
  Filled 2015-08-18: qty 24

## 2015-08-18 MED ORDER — SODIUM CHLORIDE 0.9 % IV SOLN
Freq: Once | INTRAVENOUS | Status: AC
  Administered 2015-08-18: 11:00:00 via INTRAVENOUS

## 2015-08-18 NOTE — Progress Notes (Signed)
[place] [phone]   Fax:([phone]) [place], [place] [address] [place] [address]  DIAGNOSIS: Stage IV (T3, N3, M1b) non-small cell lung cancer, adenocarcinoma diagnosed in January 2016 presented with right upper lobe lung mass in addition to mediastinal lymphadenopathy and large right pleural effusion as well as bone metastasis.  PRIOR THERAPY:  1. Status post Pleurx catheter placement by Dr. Servando Snare for drainage of the right pleural effusion.  2. Systemic chemotherapy with carboplatin for AUC of 5 and Alimta 500 MG/M2 every 3 weeks. Status post 6 cycles.  CURRENT THERAPY: Patient is participating in the [facility] 370 clinical trial and has been randomized to group A arm B-3 to receive Nivolumab 240 mg given every 2 weeks. First cycle expected 06/10/2015. Status post 5 cycles.  INTERVAL HISTORY: Omar Peters 80 y.o. male returns to the clinic today for follow-up visit accompanied by his wife. The patient is feeling fine today with no specific complaints. His weight is a stable. He denied having any significant chest pain, shortness of breath, cough or hemoptysis. Has some dizzy spells recently and bruises on the right upper extremity after falls. He is tolerating his treatment with Nivolumab fairly well with no significant adverse effects. The patient denied having any significant skin rash, diarrhea. He has no nausea or vomiting. No significant headache or visual changes. He has not seen his primary care physician yet for adjustment of his blood pressure medication. He had repeat CT scan of the chest, abdomen and pelvis for restaging of his disease and he is here for evaluation and discussion of his scan results.  MEDICAL HISTORY: Past Medical History  Diagnosis Date  . Hypertension   . GERD (gastroesophageal reflux disease)   . Shortness of breath dyspnea     01/14/15 at night and has to sit up  . Anxiety     due to diagnosis of Stage 4 Lung Cancer  . Macular degeneration of right eye   . Cancer     Lung  . Diabetes mellitus without complication     ALLERGIES:  is allergic to marinol.  MEDICATIONS:  Current Outpatient Prescriptions  Medication Sig Dispense Refill  . amitriptyline (ELAVIL) 10 MG tablet Take 10 mg by mouth at bedtime.    Marland Kitchen aspirin 81 MG tablet Take 81 mg by mouth daily.    . folic acid (FOLVITE) 1 MG tablet Take 1 tablet (1 mg total) by mouth daily. 90 tablet 0  . HYDROcodone-acetaminophen (NORCO) 5-325 MG per tablet Take 1 tablet by mouth every 6 (six) hours as needed for moderate pain. 30 tablet 0  . magnesium hydroxide (MILK OF MAGNESIA) 400 MG/5ML suspension Take by mouth daily as needed for mild constipation.    . metFORMIN (GLUCOPHAGE-XR) 500 MG 24 hr tablet Take 500 mg by mouth daily with breakfast.    . prochlorperazine (COMPAZINE) 10 MG tablet Take 1 tablet (10 mg total) by mouth every 6 (six) hours as needed for nausea or vomiting. 30 tablet 0  . ranitidine (ZANTAC) 150 MG tablet Take 150 mg by mouth daily.    Marland Kitchen senna-docusate (SENNA S) 8.6-50 MG per tablet Take 2 tablets by mouth daily.     No current facility-administered medications for this visit.    SURGICAL HISTORY:  Past Surgical History  Procedure Laterality Date  . Vein ligation and stripping  1977  . Chest tube insertion Right 01/15/2015  Procedure: INSERTION PLEURAL DRAINAGE CATHETER WITH ULTRASOUND AND FLURO GUIDANCE;  Surgeon: Grace Isaac, MD;  Location: [facility];  Service: Thoracic;  Laterality: Right;  . Removal of pleural drainage catheter Right 03/26/2015    Procedure: REMOVAL OF PLEURAL DRAINAGE CATHETER;  Surgeon: Grace Isaac, MD;  Location: [facility];  Service: Thoracic;  Laterality: Right;    REVIEW OF SYSTEMS:  Constitutional: negative Eyes: negative Ears, nose, mouth, throat, and face: negative Respiratory: negative Cardiovascular: negative Gastrointestinal:  negative Genitourinary:negative Integument/breast: negative Hematologic/lymphatic: negative Musculoskeletal:negative Neurological: negative Behavioral/Psych: negative Endocrine: negative Allergic/Immunologic: negative   PHYSICAL EXAMINATION: General appearance: alert, cooperative and no distress Head: Normocephalic, without obvious abnormality, atraumatic Neck: no adenopathy, no JVD, supple, symmetrical, trachea midline and thyroid not enlarged, symmetric, no tenderness/mass/nodules Lymph nodes: Cervical, supraclavicular, and axillary nodes normal. Resp: clear to auscultation bilaterally Back: symmetric, no curvature. ROM normal. No CVA tenderness. Cardio: regular rate and rhythm, S1, S2 normal, no murmur, click, rub or gallop GI: soft, non-tender; bowel sounds normal; no masses,  no organomegaly Extremities: extremities normal, atraumatic, no cyanosis or edema Neurologic: Alert and oriented X 3, normal strength and tone. Normal symmetric reflexes. Normal coordination and gait  ECOG PERFORMANCE STATUS: 1 - Symptomatic but completely ambulatory  Blood pressure 158/85, pulse 107, temperature 97.8 F (36.6 C), temperature source Oral, resp. rate 19, height '5\' 6"'$  (1.676 m), weight 142 lb 1.6 oz (64.456 kg), SpO2 98 %.  LABORATORY DATA: Lab Results  Component Value Date   WBC 7.2 08/18/2015   HGB 13.4 08/18/2015   HCT 40.2 08/18/2015   MCV 99.1* 08/18/2015   PLT 226 08/18/2015      Chemistry      Component Value Date/Time   NA 137 08/04/2015 1146   NA 138 01/15/2015 0608   K 3.8 08/04/2015 1146   K 3.5 01/15/2015 0608   CL 97 01/15/2015 0608   CO2 28 08/04/2015 1146   CO2 34* 01/15/2015 0608   BUN 7.4 08/04/2015 1146   BUN 13 01/15/2015 0608   CREATININE 0.7 08/04/2015 1146   CREATININE 0.96 01/15/2015 0608   GLU 231* 03/10/2015 1539      Component Value Date/Time   CALCIUM 9.5 08/04/2015 1146   CALCIUM 9.4 01/15/2015 0608   ALKPHOS 90 08/04/2015 1146   ALKPHOS 102  01/15/2015 0608   AST 17 08/04/2015 1146   AST 42* 01/15/2015 0608   ALT 15 08/04/2015 1146   ALT 38 01/15/2015 0608   BILITOT 0.22 08/04/2015 1146   BILITOT 0.8 01/15/2015 0608       RADIOGRAPHIC STUDIES: Ct Chest W Contrast  08/11/2015   CLINICAL DATA:  Lung cancer restaging, diagnosed January 2016. Bone and mediastinal metastatic disease. Recist exam.  EXAM: CT CHEST, ABDOMEN, AND PELVIS WITH CONTRAST  TECHNIQUE: Multidetector CT imaging of the chest, abdomen and pelvis was performed following the standard protocol during bolus administration of intravenous contrast.  CONTRAST:  143m OMNIPAQUE IOHEXOL 300 MG/ML  SOLN  COMPARISON:  05/24/2015  FINDINGS: CT CHEST FINDINGS  RECIST 1.1  Target Lesions:  1. Right upper lobe irregular mass now measures 4.7 x 4.0 cm image 17 series 4, using similar technique and measuring the solid portions in similar configuration. 2. Right iliac bone lesion measures 3.6 cm image 88 series 2. Non-target Lesions:  1. 1.0 cm AP window lymph node image 22 series 2. Mediastinum/Nodes: Heart size is normal. Trace pericardial fluid. Pretracheal node measures 0.7 cm image 22, previously 0.9 cm.  Sub carinal node  measures 0.6 cm image 27, previously 0.8 cm.  Persistent soft tissue thickening likely representing contiguous tumoral spread, again noted in the region of the right hilum. Sub cm periaortic nodes are incidentally noted.  Great vessels are normal in caliber. Moderate to severe atheromatous aortic and coronary arterial calcifications.  Lungs/Pleura: Diffuse emphysematous change noted. 2 mm left lower lobe pulmonary nodules image 42 are stable. Scattered areas of subpleural scarring are reidentified. Right pleural effusion/thickening noted with presumed compressive atelectasis at the right lung base.  Upper abdomen: Please see dedicated report below.  Musculoskeletal: Sclerotic T11 lesion reidentified. Mild endplate disc degenerative change noted in the thoracic spine.  CT  ABDOMEN AND PELVIS FINDINGS  Lower chest:  Please see dedicated report above.  Hepatobiliary: Liver and gallbladder are unremarkable. Diffuse common duct prominence with gradual tapering to the ampulla is reidentified, unchanged at 1.3 cm.  Pancreas: Normal  Spleen: Normal  Adrenals/Urinary Tract: Adrenal glands appear normal. 5 mm mid right renal cortical hypodense lesion image 61, most likely a cyst but too small for further characterization, is stable. Left kidney is normal. No hydronephrosis. No radiopaque ureteral or bladder calculus.  Stomach/Bowel: Normal appendix. No bowel wall thickening or focal segmental dilatation is identified.  Vascular/Lymphatic: Severe atheromatous aortic calcification without aneurysm. No lymphadenopathy.  Other: No free air or fluid.  Musculoskeletal: Right iliac lesion as reported measured above, with minimal increased internal sclerosis subjectively. Probable pathologic fracture component with cortical disruption reidentified. Degenerative change in the lumbar spine. No change otherwise.  IMPRESSION: Decrease in size of dominant right upper lobe pulmonary parenchymal mass compatible with response to treatment.  Minimal decrease in size of index AP window lymph node.  No significant change in size of lytic right iliac bone lesion, although minimal internal increase in sclerosis could indicate treatment response.  Presumed metastatic T11 lesion reidentified.  No new intra-abdominal or pelvic pathology.   Electronically Signed   By: Conchita Paris M.D.   On: 08/11/2015 10:52   Ct Abdomen Pelvis W Contrast  08/11/2015   CLINICAL DATA:  Lung cancer restaging, diagnosed January 2016. Bone and mediastinal metastatic disease. Recist exam.  EXAM: CT CHEST, ABDOMEN, AND PELVIS WITH CONTRAST  TECHNIQUE: Multidetector CT imaging of the chest, abdomen and pelvis was performed following the standard protocol during bolus administration of intravenous contrast.  CONTRAST:  173m OMNIPAQUE  IOHEXOL 300 MG/ML  SOLN  COMPARISON:  05/24/2015  FINDINGS: CT CHEST FINDINGS  RECIST 1.1  Target Lesions:  1. Right upper lobe irregular mass now measures 4.7 x 4.0 cm image 17 series 4, using similar technique and measuring the solid portions in similar configuration. 2. Right iliac bone lesion measures 3.6 cm image 88 series 2. Non-target Lesions:  1. 1.0 cm AP window lymph node image 22 series 2. Mediastinum/Nodes: Heart size is normal. Trace pericardial fluid. Pretracheal node measures 0.7 cm image 22, previously 0.9 cm.  Sub carinal node measures 0.6 cm image 27, previously 0.8 cm.  Persistent soft tissue thickening likely representing contiguous tumoral spread, again noted in the region of the right hilum. Sub cm periaortic nodes are incidentally noted.  Great vessels are normal in caliber. Moderate to severe atheromatous aortic and coronary arterial calcifications.  Lungs/Pleura: Diffuse emphysematous change noted. 2 mm left lower lobe pulmonary nodules image 42 are stable. Scattered areas of subpleural scarring are reidentified. Right pleural effusion/thickening noted with presumed compressive atelectasis at the right lung base.  Upper abdomen: Please see dedicated report below.  Musculoskeletal: Sclerotic T11  lesion reidentified. Mild endplate disc degenerative change noted in the thoracic spine.  CT ABDOMEN AND PELVIS FINDINGS  Lower chest:  Please see dedicated report above.  Hepatobiliary: Liver and gallbladder are unremarkable. Diffuse common duct prominence with gradual tapering to the ampulla is reidentified, unchanged at 1.3 cm.  Pancreas: Normal  Spleen: Normal  Adrenals/Urinary Tract: Adrenal glands appear normal. 5 mm mid right renal cortical hypodense lesion image 61, most likely a cyst but too small for further characterization, is stable. Left kidney is normal. No hydronephrosis. No radiopaque ureteral or bladder calculus.  Stomach/Bowel: Normal appendix. No bowel wall thickening or focal  segmental dilatation is identified.  Vascular/Lymphatic: Severe atheromatous aortic calcification without aneurysm. No lymphadenopathy.  Other: No free air or fluid.  Musculoskeletal: Right iliac lesion as reported measured above, with minimal increased internal sclerosis subjectively. Probable pathologic fracture component with cortical disruption reidentified. Degenerative change in the lumbar spine. No change otherwise.  IMPRESSION: Decrease in size of dominant right upper lobe pulmonary parenchymal mass compatible with response to treatment.  Minimal decrease in size of index AP window lymph node.  No significant change in size of lytic right iliac bone lesion, although minimal internal increase in sclerosis could indicate treatment response.  Presumed metastatic T11 lesion reidentified.  No new intra-abdominal or pelvic pathology.   Electronically Signed   By: Conchita Paris M.D.   On: 08/11/2015 10:52    ASSESSMENT AND PLAN: This is a very pleasant 80 years old white male with history of stage IV non-small cell lung cancer, adenocarcinoma status post induction chemotherapy was carboplatin and Alimta and currently undergoing treatment with immunotherapy according to the [employer] 370 clinical trial. He was randomized to the Nivolumab arm 240 mg IV every 2 weeks. He is status post 5 cycles and tolerating his treatment fairly well. The recent CT scan of the chest, abdomen and pelvis showed improvement in his disease with decrease in the size of the dominant right upper lobe pulmonary parenchymal mass as well as mediastinal lymphadenopathy. I discussed the scan results with the patient and his wife. I recommended for the patient to proceed with cycle #6 of his immunotherapy today as scheduled. He would come back for follow-up visit in 2 weeks for reevaluation and management of any adverse effect of his treatment. For the dizzy spells, I will continue to monitor the patient closely and if he continues  to have more frequent episodes, we will repeat MRI of the brain to rule out brain metastasis. The patient was advised to call immediately if he has any concerning symptoms in the interval. The patient voices understanding of current disease status and treatment options and is in agreement with the current care plan.  All questions were answered. The patient knows to call the clinic with any problems, questions or concerns. We can certainly see the patient much sooner if necessary.  Disclaimer: This note was dictated with voice recognition software. Similar sounding words can inadvertently be transcribed and may not be corrected upon review.

## 2015-08-18 NOTE — Progress Notes (Signed)
[id number]: Group A, Arm B3: Cycle 6/week 10 Patient here today for labs, MD and treatment appointment for Cycle 6. I met with patient and spouse after patient had completed PROs with research assistant Marthann Schiller, and lab draw. Resting vitals obtained with O2 sat @ 98% B/P @ 158/85 and HR 107, patient maintaining weight @ 142 lbs. Patient denies any new medications, states he has stopped taking Elavil last week Thursday d/t experiencing unusual dreams. Patient reports no new side effects or concerns and denies nausea/vomiting, denies diarrhea, no skin rashes, no edema present, denies SOB, wheezing or bleeding. Patient denies pain or any neuro concerns. Patient confirms no change in his energy level, continues with constipation and continues to use daily senokot as instructed. I encouraged patient to drink 6-8 glasses of water daily if able for adequate hydration and bowel health, patient agrees to try. Dr. Julien Nordmann reviewed 08/24 CT with patient and spouse. Per MD's assessment, CT results and MD's review of labs and abnormal NCS labs, per MD, patient to proceed with Cycle 6 today. All patient's and spouses questions answered to their satisfaction, deny further questions at this time. Copy of CT given to patient per their request. Patient confirmed that he has an appt in September with his PCP and will f/u with him regarding his B/P. I encouraged patient and spouse to call Dr. Julien Nordmann or myself with any questions or concerns. Adele Dan, RN, BSN Clinical Research 08/18/2015 11:14 AM

## 2015-08-18 NOTE — Patient Instructions (Signed)
Nivolumab injection What is this medicine? NIVOLUMAB (nye VOL ue mab) is used to treat certain types of melanoma and lung cancer. This medicine may be used for other purposes; ask your health care provider or pharmacist if you have questions. COMMON BRAND NAME(S): Opdivo What should I tell my health care provider before I take this medicine? They need to know if you have any of these conditions: -eye disease, vision problems -history of pancreatitis -immune system problems -inflammatory bowel disease -kidney disease -liver disease -lung disease -lupus -myasthenia gravis -multiple sclerosis -organ transplant -stomach or intestine problems -thyroid disease -tingling of the fingers or toes, or other nerve disorder -an unusual or allergic reaction to nivolumab, other medicines, foods, dyes, or preservatives -pregnant or trying to get pregnant -breast-feeding How should I use this medicine? This medicine is for infusion into a vein. It is given by a health care professional in a hospital or clinic setting. A special MedGuide will be given to you before each treatment. Be sure to read this information carefully each time. Talk to your pediatrician regarding the use of this medicine in children. Special care may be needed. Overdosage: If you think you've taken too much of this medicine contact a poison control center or emergency room at once. Overdosage: If you think you have taken too much of this medicine contact a poison control center or emergency room at once. NOTE: This medicine is only for you. Do not share this medicine with others. What if I miss a dose? It is important not to miss your dose. Call your doctor or health care professional if you are unable to keep an appointment. What may interact with this medicine? Interactions have not been studied. This list may not describe all possible interactions. Give your health care provider a list of all the medicines, herbs,  non-prescription drugs, or dietary supplements you use. Also tell them if you smoke, drink alcohol, or use illegal drugs. Some items may interact with your medicine. What should I watch for while using this medicine? Tell your doctor or healthcare professional if your symptoms do not start to get better or if they get worse. Your condition will be monitored carefully while you are receiving this medicine. You may need blood work done while you are taking this medicine. What side effects may I notice from receiving this medicine? Side effects that you should report to your doctor or health care professional as soon as possible: -allergic reactions like skin rash, itching or hives, swelling of the face, lips, or tongue -black, tarry stools -bloody or watery diarrhea -changes in vision -chills -cough -depressed mood -eye pain -feeling anxious -fever -general ill feeling or flu-like symptoms -hair loss -loss of appetite -low blood counts - this medicine may decrease the number of white blood cells, red blood cells and platelets. You may be at increased risk for infections and bleeding -pain, tingling, numbness in the hands or feet -redness, blistering, peeling or loosening of the skin, including inside the mouth -red pinpoint spots on skin -signs of decreased platelets or bleeding - bruising, pinpoint red spots on the skin, black, tarry stools, blood in the urine -signs of decreased red blood cells - unusually weak or tired, feeling faint or lightheaded, falls -signs of infection - fever or chills, cough, sore throat, pain or trouble passing urine -signs and symptoms of a dangerous change in heartbeat or heart rhythm like chest pain; dizziness; fast or irregular heartbeat; palpitations; feeling faint or lightheaded, falls; breathing problems -signs   and symptoms of high blood sugar such as dizziness; dry mouth; dry skin; fruity breath; nausea; stomach pain; increased hunger or thirst; increased  urination -signs and symptoms of kidney injury like trouble passing urine or change in the amount of urine -signs and symptoms of liver injury like dark yellow or brown urine; general ill feeling or flu-like symptoms; light-colored stools; loss of appetite; nausea; right upper belly pain; unusually weak or tired; yellowing of the eyes or skin -signs and symptoms of increased potassium like muscle weakness; chest pain; or fast, irregular heartbeat -signs and symptoms of low potassium like muscle cramps or muscle pain; chest pain; dizziness; feeling faint or lightheaded, falls; palpitations; breathing problems; or fast, irregular heartbeat -swelling of the ankles, feet, hands -weight gainSide effects that usually do not require medical attention (report to your doctor or health care professional if they continue or are bothersome): -constipation -general ill feeling or flu-like symptoms -hair loss -loss of appetite -nausea, vomiting This list may not describe all possible side effects. Call your doctor for medical advice about side effects. You may report side effects to FDA at 1-800-FDA-1088. Where should I keep my medicine? This drug is given in a hospital or clinic and will not be stored at home. NOTE: This sheet is a summary. It may not cover all possible information. If you have questions about this medicine, talk to your doctor, pharmacist, or health care provider.  2015, [employer]. (2014-02-23 13:18:19)  

## 2015-08-18 NOTE — Progress Notes (Signed)
[facility] [id number] - questionnaires (PROs) - patient into the cancer center for routine visit. Patient was given PROs upon arrival to the cancer center. The patient completed his PROs ([id number] first and then FACT-L) before any study procedures were performed. I checked the PROs for completeness.  Patient's research blood was drawn 07/21/2015.  The patient was thanked for his continued support of this clinical trial. Barb Tasman Zapata 08/18/2015 10:47 AM

## 2015-08-19 ENCOUNTER — Encounter: Payer: Self-pay | Admitting: Medical Oncology

## 2015-08-19 ENCOUNTER — Telehealth: Payer: Self-pay | Admitting: Internal Medicine

## 2015-08-19 NOTE — Telephone Encounter (Signed)
Added another visit on 9/14 and she is aware  anne

## 2015-08-19 NOTE — Telephone Encounter (Signed)
Spoke with patients wife and she is aware of his appointments

## 2015-08-19 NOTE — Progress Notes (Signed)
[facility] 370: Group A, Arm B3 CT scan 08/11/2015, Per Radiologist report, there are LLL pulmonary nodules that are "stable" and not previously mentioned in 05/24/15 CT report. These are not new nodules, reviewed report with MD, these nodules were not addressed in the June 6 CT report.   Adele Dan, RN, BSN Clinical Research 08/19/2015 1:18 PM

## 2015-08-27 ENCOUNTER — Other Ambulatory Visit: Payer: Self-pay | Admitting: Medical Oncology

## 2015-08-27 DIAGNOSIS — C3411 Malignant neoplasm of upper lobe, right bronchus or lung: Secondary | ICD-10-CM

## 2015-08-30 ENCOUNTER — Telehealth: Payer: Self-pay | Admitting: Medical Oncology

## 2015-08-30 NOTE — Telephone Encounter (Signed)
I called pt to f/u episode of incoherence yesterday-wife states pt got better after 1 hour and refused to go to ED and he began speaking appropriately .

## 2015-08-31 ENCOUNTER — Other Ambulatory Visit: Payer: Self-pay | Admitting: Physician Assistant

## 2015-08-31 DIAGNOSIS — R110 Nausea: Secondary | ICD-10-CM

## 2015-08-31 DIAGNOSIS — T451X5A Adverse effect of antineoplastic and immunosuppressive drugs, initial encounter: Principal | ICD-10-CM

## 2015-09-01 ENCOUNTER — Ambulatory Visit (HOSPITAL_BASED_OUTPATIENT_CLINIC_OR_DEPARTMENT_OTHER): Payer: Medicare Other | Admitting: Physician Assistant

## 2015-09-01 ENCOUNTER — Other Ambulatory Visit: Payer: Medicare Other

## 2015-09-01 ENCOUNTER — Encounter: Payer: Self-pay | Admitting: Oncology

## 2015-09-01 ENCOUNTER — Encounter: Payer: Self-pay | Admitting: Physician Assistant

## 2015-09-01 ENCOUNTER — Ambulatory Visit (HOSPITAL_BASED_OUTPATIENT_CLINIC_OR_DEPARTMENT_OTHER): Payer: Medicare Other

## 2015-09-01 ENCOUNTER — Encounter: Payer: Self-pay | Admitting: Medical Oncology

## 2015-09-01 ENCOUNTER — Other Ambulatory Visit (HOSPITAL_BASED_OUTPATIENT_CLINIC_OR_DEPARTMENT_OTHER): Payer: Medicare Other

## 2015-09-01 ENCOUNTER — Other Ambulatory Visit (HOSPITAL_COMMUNITY)
Admission: RE | Admit: 2015-09-01 | Discharge: 2015-09-01 | Disposition: A | Discharge status: Home / Self Care | Payer: Medicare Other | Source: Ambulatory Visit | Attending: Internal Medicine | Admitting: Internal Medicine

## 2015-09-01 VITALS — BP 156/78 | HR 98 | Temp 98.3°F | Resp 16 | Ht 66.0 in | Wt 142.9 lb

## 2015-09-01 DIAGNOSIS — Z5112 Encounter for antineoplastic immunotherapy: Secondary | ICD-10-CM

## 2015-09-01 DIAGNOSIS — Z006 Encounter for examination for normal comparison and control in clinical research program: Secondary | ICD-10-CM

## 2015-09-01 DIAGNOSIS — C7951 Secondary malignant neoplasm of bone: Secondary | ICD-10-CM

## 2015-09-01 DIAGNOSIS — C3411 Malignant neoplasm of upper lobe, right bronchus or lung: Secondary | ICD-10-CM

## 2015-09-01 DIAGNOSIS — Z87891 Personal history of nicotine dependence: Secondary | ICD-10-CM | POA: Insufficient documentation

## 2015-09-01 DIAGNOSIS — K5900 Constipation, unspecified: Secondary | ICD-10-CM

## 2015-09-01 DIAGNOSIS — Z79899 Other long term (current) drug therapy: Secondary | ICD-10-CM | POA: Diagnosis not present

## 2015-09-01 LAB — CBC WITH DIFFERENTIAL/PLATELET
BASO%: 0.4 % (ref 0.0–2.0)
Basophils Absolute: 0 10*3/uL (ref 0.0–0.1)
EOS%: 3.7 % (ref 0.0–7.0)
Eosinophils Absolute: 0.3 10*3/uL (ref 0.0–0.5)
HEMATOCRIT: 40.7 % (ref 38.4–49.9)
HGB: 13.5 g/dL (ref 13.0–17.1)
LYMPH#: 1.5 10*3/uL (ref 0.9–3.3)
LYMPH%: 21 % (ref 14.0–49.0)
MCH: 32.5 pg (ref 27.2–33.4)
MCHC: 33.1 g/dL (ref 32.0–36.0)
MCV: 98.3 fL — ABNORMAL HIGH (ref 79.3–98.0)
MONO#: 0.7 10*3/uL (ref 0.1–0.9)
MONO%: 10.5 % (ref 0.0–14.0)
NEUT#: 4.5 10*3/uL (ref 1.5–6.5)
NEUT%: 64.4 % (ref 39.0–75.0)
Platelets: 189 10*3/uL (ref 140–400)
RBC: 4.14 10*6/uL — AB (ref 4.20–5.82)
RDW: 14 % (ref 11.0–14.6)
WBC: 6.9 10*3/uL (ref 4.0–10.3)

## 2015-09-01 LAB — COMPREHENSIVE METABOLIC PANEL (CC13)
ALT: 16 U/L (ref 0–55)
ANION GAP: 9 meq/L (ref 3–11)
AST: 16 U/L (ref 5–34)
Albumin: 3 g/dL — ABNORMAL LOW (ref 3.5–5.0)
Alkaline Phosphatase: 85 U/L (ref 40–150)
BUN: 7.1 mg/dL (ref 7.0–26.0)
CALCIUM: 9.8 mg/dL (ref 8.4–10.4)
CHLORIDE: 105 meq/L (ref 98–109)
CO2: 26 meq/L (ref 22–29)
Creatinine: 0.8 mg/dL (ref 0.7–1.3)
EGFR: 88 mL/min/{1.73_m2} — AB (ref >90)
Glucose: 182 mg/dl — ABNORMAL HIGH (ref 70–140)
POTASSIUM: 3.7 meq/L (ref 3.5–5.1)
Sodium: 139 mEq/L (ref 136–145)
Total Bilirubin: 0.26 mg/dL (ref 0.20–1.20)
Total Protein: 7.1 g/dL (ref 6.4–8.3)

## 2015-09-01 LAB — MAGNESIUM (CC13): MAGNESIUM: 2 mg/dL (ref 1.5–2.5)

## 2015-09-01 LAB — TSH CHCC: TSH: 2.621 m[IU]/L (ref 0.320–4.118)

## 2015-09-01 LAB — PHOSPHORUS: PHOSPHORUS: 3.7 mg/dL (ref 2.5–4.6)

## 2015-09-01 LAB — LACTATE DEHYDROGENASE (CC13): LDH: 117 U/L — AB (ref 125–245)

## 2015-09-01 LAB — RESEARCH LABS

## 2015-09-01 MED ORDER — INV-NIVOLUMAB CHEMO INJECTION 100 MG/10 ML BMS CA 209-370
240.0000 mg | Freq: Once | INTRAVENOUS | Status: AC
  Administered 2015-09-01: 240 mg via INTRAVENOUS
  Filled 2015-09-01: qty 24

## 2015-09-01 MED ORDER — SODIUM CHLORIDE 0.9 % IV SOLN
Freq: Once | INTRAVENOUS | Status: AC
  Administered 2015-09-01: 10:00:00 via INTRAVENOUS

## 2015-09-01 NOTE — Progress Notes (Signed)
[name] [phone]   Fax:([phone]) [place], [place] [address] [place] [address]  DIAGNOSIS: Stage IV (T3, N3, M1b) non-small cell lung cancer, adenocarcinoma diagnosed in January 2016 presented with right upper lobe lung mass in addition to mediastinal lymphadenopathy and large right pleural effusion as well as bone metastasis.  PRIOR THERAPY:  1. Status post Pleurx catheter placement by Dr. Servando Snare for drainage of the right pleural effusion.  2. Systemic chemotherapy with carboplatin for AUC of 5 and Alimta 500 MG/M2 every 3 weeks. Status post 6 cycles.  CURRENT THERAPY: Patient is participating in the  370 clinical trial and has been randomized to group A arm B-3 to receive Nivolumab 240 mg given every 2 weeks. First cycle expected 06/10/2015. Status post 6 cycles.  INTERVAL HISTORY: Omar Peters 80 y.o. male returns to the clinic today for follow-up visit accompanied by his wife. She reports he had some confusion over the weekend.  His symptoms improved after drinking more fluids including some Pediolyte. He admits to not drinking fluids as he should.The patient is feeling fine today with no specific complaints.  His weight is a stable. He denied having any significant chest pain, shortness of breath, cough or hemoptysis.  He is tolerating his treatment with Nivolumab fairly well with no significant adverse effects. The patient denied having any significant skin rash, diarrhea. He has no nausea or vomiting. No significant headache or visual changes. He does report some constipation.  MEDICAL HISTORY: Past Medical History  Diagnosis Date  . Hypertension   . GERD (gastroesophageal reflux disease)   . Shortness of breath dyspnea     01/14/15 at night and has to sit up  . Anxiety     due to diagnosis of Stage 4 Lung Cancer  . Macular degeneration of right eye   . Cancer     Lung  . Diabetes  mellitus without complication     ALLERGIES:  is allergic to amitriptyline and marinol.  MEDICATIONS:  Current Outpatient Prescriptions  Medication Sig Dispense Refill  . aspirin 81 MG tablet Take 81 mg by mouth daily.    . folic acid (FOLVITE) 1 MG tablet Take 1 tablet (1 mg total) by mouth daily. 90 tablet 0  . HYDROcodone-acetaminophen (NORCO) 5-325 MG per tablet Take 1 tablet by mouth every 6 (six) hours as needed for moderate pain. 30 tablet 0  . magnesium hydroxide (MILK OF MAGNESIA) 400 MG/5ML suspension Take by mouth daily as needed for mild constipation.    . metFORMIN (GLUCOPHAGE-XR) 500 MG 24 hr tablet Take 500 mg by mouth daily with breakfast.    . prochlorperazine (COMPAZINE) 10 MG tablet Take 1 tablet (10 mg total) by mouth every 6 (six) hours as needed for nausea or vomiting. 30 tablet 0  . ranitidine (ZANTAC) 150 MG tablet Take 150 mg by mouth daily.    Marland Kitchen senna-docusate (SENNA S) 8.6-50 MG per tablet Take 2 tablets by mouth daily.     No current facility-administered medications for this visit.    SURGICAL HISTORY:  Past Surgical History  Procedure Laterality Date  . Vein ligation and stripping  1977  . Chest tube insertion Right 01/15/2015    Procedure: INSERTION PLEURAL DRAINAGE CATHETER WITH ULTRASOUND AND FLURO GUIDANCE;  Surgeon: Grace Isaac, MD;  Location: [facility];  Service: Thoracic;  Laterality: Right;  . Removal of pleural drainage catheter Right 03/26/2015  Procedure: REMOVAL OF PLEURAL DRAINAGE CATHETER;  Surgeon: Grace Isaac, MD;  Location: [facility];  Service: Thoracic;  Laterality: Right;    REVIEW OF SYSTEMS:  Constitutional: negative Eyes: negative Ears, nose, mouth, throat, and face: negative Respiratory: negative Cardiovascular: negative Gastrointestinal: negative Genitourinary:negative Integument/breast: negative Hematologic/lymphatic: negative Musculoskeletal:negative Neurological: positive for mild confusion over the weekend that  improved with hydration Behavioral/Psych: negative Endocrine: negative Allergic/Immunologic: negative   PHYSICAL EXAMINATION: General appearance: alert, cooperative and no distress Head: Normocephalic, without obvious abnormality, atraumatic Neck: no adenopathy, no JVD, supple, symmetrical, trachea midline and thyroid not enlarged, symmetric, no tenderness/mass/nodules Lymph nodes: Cervical, supraclavicular, and axillary nodes normal. Resp: clear to auscultation bilaterally Back: symmetric, no curvature. ROM normal. No CVA tenderness. Cardio: regular rate and rhythm, S1, S2 normal, no murmur, click, rub or gallop GI: soft, non-tender; bowel sounds normal; no masses,  no organomegaly Extremities: extremities normal, atraumatic, no cyanosis or edema Neurologic: Alert and oriented X 3, normal strength and tone. Normal symmetric reflexes. Normal coordination and gait  ECOG PERFORMANCE STATUS: 1 - Symptomatic but completely ambulatory  Blood pressure 156/78, pulse 98, temperature 98.3 F (36.8 C), temperature source Oral, resp. rate 16, height '5\' 6"'$  (1.676 m), weight 142 lb 14.4 oz (64.819 kg), SpO2 98 %.  LABORATORY DATA: Lab Results  Component Value Date   WBC 6.9 09/01/2015   HGB 13.5 09/01/2015   HCT 40.7 09/01/2015   MCV 98.3* 09/01/2015   PLT 189 09/01/2015      Chemistry      Component Value Date/Time   NA 139 09/01/2015 0820   NA 138 01/15/2015 0608   K 3.7 09/01/2015 0820   K 3.5 01/15/2015 0608   CL 97 01/15/2015 0608   CO2 26 09/01/2015 0820   CO2 34* 01/15/2015 0608   BUN 7.1 09/01/2015 0820   BUN 13 01/15/2015 0608   CREATININE 0.8 09/01/2015 0820   CREATININE 0.96 01/15/2015 0608   GLU 231* 03/10/2015 1539      Component Value Date/Time   CALCIUM 9.8 09/01/2015 0820   CALCIUM 9.4 01/15/2015 0608   ALKPHOS 85 09/01/2015 0820   ALKPHOS 102 01/15/2015 0608   AST 16 09/01/2015 0820   AST 42* 01/15/2015 0608   ALT 16 09/01/2015 0820   ALT 38 01/15/2015 0608     BILITOT 0.26 09/01/2015 0820   BILITOT 0.8 01/15/2015 0608       RADIOGRAPHIC STUDIES: Ct Chest W Contrast  08/11/2015   CLINICAL DATA:  Lung cancer restaging, diagnosed January 2016. Bone and mediastinal metastatic disease. Recist exam.  EXAM: CT CHEST, ABDOMEN, AND PELVIS WITH CONTRAST  TECHNIQUE: Multidetector CT imaging of the chest, abdomen and pelvis was performed following the standard protocol during bolus administration of intravenous contrast.  CONTRAST:  180m OMNIPAQUE IOHEXOL 300 MG/ML  SOLN  COMPARISON:  05/24/2015  FINDINGS: CT CHEST FINDINGS  RECIST 1.1  Target Lesions:  1. Right upper lobe irregular mass now measures 4.7 x 4.0 cm image 17 series 4, using similar technique and measuring the solid portions in similar configuration. 2. Right iliac bone lesion measures 3.6 cm image 88 series 2. Non-target Lesions:  1. 1.0 cm AP window lymph node image 22 series 2. Mediastinum/Nodes: Heart size is normal. Trace pericardial fluid. Pretracheal node measures 0.7 cm image 22, previously 0.9 cm.  Sub carinal node measures 0.6 cm image 27, previously 0.8 cm.  Persistent soft tissue thickening likely representing contiguous tumoral spread, again noted in the region of the right hilum.  Sub cm periaortic nodes are incidentally noted.  Great vessels are normal in caliber. Moderate to severe atheromatous aortic and coronary arterial calcifications.  Lungs/Pleura: Diffuse emphysematous change noted. 2 mm left lower lobe pulmonary nodules image 42 are stable. Scattered areas of subpleural scarring are reidentified. Right pleural effusion/thickening noted with presumed compressive atelectasis at the right lung base.  Upper abdomen: Please see dedicated report below.  Musculoskeletal: Sclerotic T11 lesion reidentified. Mild endplate disc degenerative change noted in the thoracic spine.  CT ABDOMEN AND PELVIS FINDINGS  Lower chest:  Please see dedicated report above.  Hepatobiliary: Liver and gallbladder are  unremarkable. Diffuse common duct prominence with gradual tapering to the ampulla is reidentified, unchanged at 1.3 cm.  Pancreas: Normal  Spleen: Normal  Adrenals/Urinary Tract: Adrenal glands appear normal. 5 mm mid right renal cortical hypodense lesion image 61, most likely a cyst but too small for further characterization, is stable. Left kidney is normal. No hydronephrosis. No radiopaque ureteral or bladder calculus.  Stomach/Bowel: Normal appendix. No bowel wall thickening or focal segmental dilatation is identified.  Vascular/Lymphatic: Severe atheromatous aortic calcification without aneurysm. No lymphadenopathy.  Other: No free air or fluid.  Musculoskeletal: Right iliac lesion as reported measured above, with minimal increased internal sclerosis subjectively. Probable pathologic fracture component with cortical disruption reidentified. Degenerative change in the lumbar spine. No change otherwise.  IMPRESSION: Decrease in size of dominant right upper lobe pulmonary parenchymal mass compatible with response to treatment.  Minimal decrease in size of index AP window lymph node.  No significant change in size of lytic right iliac bone lesion, although minimal internal increase in sclerosis could indicate treatment response.  Presumed metastatic T11 lesion reidentified.  No new intra-abdominal or pelvic pathology.   Electronically Signed   By: Conchita Paris M.D.   On: 08/11/2015 10:52   Ct Abdomen Pelvis W Contrast  08/11/2015   CLINICAL DATA:  Lung cancer restaging, diagnosed January 2016. Bone and mediastinal metastatic disease. Recist exam.  EXAM: CT CHEST, ABDOMEN, AND PELVIS WITH CONTRAST  TECHNIQUE: Multidetector CT imaging of the chest, abdomen and pelvis was performed following the standard protocol during bolus administration of intravenous contrast.  CONTRAST:  155m OMNIPAQUE IOHEXOL 300 MG/ML  SOLN  COMPARISON:  05/24/2015  FINDINGS: CT CHEST FINDINGS  RECIST 1.1  Target Lesions:  1. Right  upper lobe irregular mass now measures 4.7 x 4.0 cm image 17 series 4, using similar technique and measuring the solid portions in similar configuration. 2. Right iliac bone lesion measures 3.6 cm image 88 series 2. Non-target Lesions:  1. 1.0 cm AP window lymph node image 22 series 2. Mediastinum/Nodes: Heart size is normal. Trace pericardial fluid. Pretracheal node measures 0.7 cm image 22, previously 0.9 cm.  Sub carinal node measures 0.6 cm image 27, previously 0.8 cm.  Persistent soft tissue thickening likely representing contiguous tumoral spread, again noted in the region of the right hilum. Sub cm periaortic nodes are incidentally noted.  Great vessels are normal in caliber. Moderate to severe atheromatous aortic and coronary arterial calcifications.  Lungs/Pleura: Diffuse emphysematous change noted. 2 mm left lower lobe pulmonary nodules image 42 are stable. Scattered areas of subpleural scarring are reidentified. Right pleural effusion/thickening noted with presumed compressive atelectasis at the right lung base.  Upper abdomen: Please see dedicated report below.  Musculoskeletal: Sclerotic T11 lesion reidentified. Mild endplate disc degenerative change noted in the thoracic spine.  CT ABDOMEN AND PELVIS FINDINGS  Lower chest:  Please see dedicated report above.  Hepatobiliary: Liver and gallbladder are unremarkable. Diffuse common duct prominence with gradual tapering to the ampulla is reidentified, unchanged at 1.3 cm.  Pancreas: Normal  Spleen: Normal  Adrenals/Urinary Tract: Adrenal glands appear normal. 5 mm mid right renal cortical hypodense lesion image 61, most likely a cyst but too small for further characterization, is stable. Left kidney is normal. No hydronephrosis. No radiopaque ureteral or bladder calculus.  Stomach/Bowel: Normal appendix. No bowel wall thickening or focal segmental dilatation is identified.  Vascular/Lymphatic: Severe atheromatous aortic calcification without aneurysm. No  lymphadenopathy.  Other: No free air or fluid.  Musculoskeletal: Right iliac lesion as reported measured above, with minimal increased internal sclerosis subjectively. Probable pathologic fracture component with cortical disruption reidentified. Degenerative change in the lumbar spine. No change otherwise.  IMPRESSION: Decrease in size of dominant right upper lobe pulmonary parenchymal mass compatible with response to treatment.  Minimal decrease in size of index AP window lymph node.  No significant change in size of lytic right iliac bone lesion, although minimal internal increase in sclerosis could indicate treatment response.  Presumed metastatic T11 lesion reidentified.  No new intra-abdominal or pelvic pathology.   Electronically Signed   By: Conchita Paris M.D.   On: 08/11/2015 10:52    ASSESSMENT AND PLAN: This is a very pleasant 80 years old white male with history of stage IV non-small cell lung cancer, adenocarcinoma status post induction chemotherapy was carboplatin and Alimta and currently undergoing treatment with immunotherapy according to the BMS checkmated 370 clinical trial. He was randomized to the Nivolumab arm 240 mg IV every 2 weeks. He is status post 6 cycles and tolerating his treatment fairly well. The recent CT scan of the chest, abdomen and pelvis showed improvement in his disease with decrease in the size of the dominant right upper lobe pulmonary parenchymal mass as well as mediastinal lymphadenopathy. The patient was discussed with and also seen by Dr. Julien Nordmann. He was advised to increase his daily fluid intake. He will be given IV fluids today. He was also advised to take a stool softener daily to help with his constipation. The patient was discussed with and also seen by Dr. Julien Nordmann. He willI  proceed with cycle #7 of his immunotherapy today as scheduled. He will return for a follow-up visit in 2 weeks for reevaluation and management of any adverse effect of his treatment, prior  to cycle #8. The patient was advised to call immediately if he has any concerning symptoms in the interval. The patient voices understanding of current disease status and treatment options and is in agreement with the current care plan.  All questions were answered. The patient knows to call the clinic with any problems, questions or concerns. We can certainly see the patient much sooner if necessary.  Carlton Adam PA-C   ADDENDUM: Hematology/Oncology Attending: I had a face to face encounter with the patient. I recommended his care plan. This is a very pleasant 80 years old white male with a stage IV non-small cell lung cancer, adenocarcinoma currently undergoing treatment with immunotherapy with Nivolumab status post 6 cycles. The patient history rating his treatment well with no specific complaints. I recommended for the patient to proceed with cycle #7 today as scheduled. He would come back for follow-up visit in 2 weeks for reevaluation before starting cycle #8.  The patient was advised to call immediately if he has any concerning symptoms in the interval.  Disclaimer: This note was dictated with voice recognition software. Similar sounding  words can inadvertently be transcribed and may not be corrected upon review. Eilleen Kempf., MD 09/11/2015

## 2015-09-01 NOTE — Progress Notes (Signed)
BMS [id number] - questionnaires (PRO's) - patient into cancer center for routine visit.  Patient was given PRO's upon arrival to the cancer center.  The patient completed his PRO's (EQ-5D-3L first and then the FACT-L).  I checked the PRO's for completeness.  He had his research blood drawn today.  The patient was thanked for his continued support in this clinical trail. Remer Macho 09/01/15 - 8:30 am

## 2015-09-01 NOTE — Patient Instructions (Signed)
Nivolumab injection What is this medicine? NIVOLUMAB (nye VOL ue mab) is used to treat certain types of melanoma and lung cancer. This medicine may be used for other purposes; ask your health care provider or pharmacist if you have questions. COMMON BRAND NAME(S): Opdivo What should I tell my health care provider before I take this medicine? They need to know if you have any of these conditions: -eye disease, vision problems -history of pancreatitis -immune system problems -inflammatory bowel disease -kidney disease -liver disease -lung disease -lupus -myasthenia gravis -multiple sclerosis -organ transplant -stomach or intestine problems -thyroid disease -tingling of the fingers or toes, or other nerve disorder -an unusual or allergic reaction to nivolumab, other medicines, foods, dyes, or preservatives -pregnant or trying to get pregnant -breast-feeding How should I use this medicine? This medicine is for infusion into a vein. It is given by a health care professional in a hospital or clinic setting. A special MedGuide will be given to you before each treatment. Be sure to read this information carefully each time. Talk to your pediatrician regarding the use of this medicine in children. Special care may be needed. Overdosage: If you think you've taken too much of this medicine contact a poison control center or emergency room at once. Overdosage: If you think you have taken too much of this medicine contact a poison control center or emergency room at once. NOTE: This medicine is only for you. Do not share this medicine with others. What if I miss a dose? It is important not to miss your dose. Call your doctor or health care professional if you are unable to keep an appointment. What may interact with this medicine? Interactions have not been studied. This list may not describe all possible interactions. Give your health care provider a list of all the medicines, herbs,  non-prescription drugs, or dietary supplements you use. Also tell them if you smoke, drink alcohol, or use illegal drugs. Some items may interact with your medicine. What should I watch for while using this medicine? Tell your doctor or healthcare professional if your symptoms do not start to get better or if they get worse. Your condition will be monitored carefully while you are receiving this medicine. You may need blood work done while you are taking this medicine. What side effects may I notice from receiving this medicine? Side effects that you should report to your doctor or health care professional as soon as possible: -allergic reactions like skin rash, itching or hives, swelling of the face, lips, or tongue -black, tarry stools -bloody or watery diarrhea -changes in vision -chills -cough -depressed mood -eye pain -feeling anxious -fever -general ill feeling or flu-like symptoms -hair loss -loss of appetite -low blood counts - this medicine may decrease the number of white blood cells, red blood cells and platelets. You may be at increased risk for infections and bleeding -pain, tingling, numbness in the hands or feet -redness, blistering, peeling or loosening of the skin, including inside the mouth -red pinpoint spots on skin -signs of decreased platelets or bleeding - bruising, pinpoint red spots on the skin, black, tarry stools, blood in the urine -signs of decreased red blood cells - unusually weak or tired, feeling faint or lightheaded, falls -signs of infection - fever or chills, cough, sore throat, pain or trouble passing urine -signs and symptoms of a dangerous change in heartbeat or heart rhythm like chest pain; dizziness; fast or irregular heartbeat; palpitations; feeling faint or lightheaded, falls; breathing problems -signs   and symptoms of high blood sugar such as dizziness; dry mouth; dry skin; fruity breath; nausea; stomach pain; increased hunger or thirst; increased  urination -signs and symptoms of kidney injury like trouble passing urine or change in the amount of urine -signs and symptoms of liver injury like dark yellow or brown urine; general ill feeling or flu-like symptoms; light-colored stools; loss of appetite; nausea; right upper belly pain; unusually weak or tired; yellowing of the eyes or skin -signs and symptoms of increased potassium like muscle weakness; chest pain; or fast, irregular heartbeat -signs and symptoms of low potassium like muscle cramps or muscle pain; chest pain; dizziness; feeling faint or lightheaded, falls; palpitations; breathing problems; or fast, irregular heartbeat -swelling of the ankles, feet, hands -weight gainSide effects that usually do not require medical attention (report to your doctor or health care professional if they continue or are bothersome): -constipation -general ill feeling or flu-like symptoms -hair loss -loss of appetite -nausea, vomiting This list may not describe all possible side effects. Call your doctor for medical advice about side effects. You may report side effects to FDA at 1-800-FDA-1088. Where should I keep my medicine? This drug is given in a hospital or clinic and will not be stored at home. NOTE: This sheet is a summary. It may not cover all possible information. If you have questions about this medicine, talk to your doctor, pharmacist, or health care provider.  2015, [employer]. (2014-02-23 13:18:19)  

## 2015-09-01 NOTE — Progress Notes (Signed)
BMS 370: Group A, Arm B3, Cycle 7/week 12 Met with patient and spouse in exam room after patient completed PRO's, scheduled labs and resting vital signs. Patient continues to maintain weight @ 64.8 kg, retake of B/P 156/78 and HR @ 98, after patient had several minutes to sit and rest. O2 sat @ 98%. Patient has shared appointment with PA, Awilda Metro and Dr. Julien Nordmann today. Patient continues to c/o of decrease in energy but confirms that it isn't worse than at baseline, but not improved. Patient also continues to report constipation and was advised to take miralax daily and to increase his water intake. Spouse reports on Sunday (09/11) patient had an episode of confusion and was "incoherrent," and patient's B/P dropped to 101/53, spouse states episode lasted for approximately an hour and patient was back to "his normal self." Spouse states she called clinic and was advised to bring patient to ED, but patient refused to go. Patient with no symptoms at today's visit. Patient did see his PCP yesterday (09/13) and was cleared and encouraged to increase fluid intake, and per spouse, PCP was not concerned with patient's B/P or confusion episode and Per Dr. Julien Nordmann, will monitor this.  At today's visit, patient denied pain, SOB or breathing problems, denies skin problems, denies neuro problems or sleeping issues, denies bladder problems and confirms no nausea or vomiting, no headaches or unusual vision problems. Patient does continue to report low appetite and was encouraged to add ice cream to his premier protein shake by his PCP and per patient PCP not concerned with increase in glucose level. Today's glucose resulted at 182. Patient reports will be adding OTC ex-lax to his current medication, to help with his constipation and Miralax, per PA advice.  Per PA and Dr. Worthy Flank assessment of patient, review of labs and NCS labs, patient cleared to receive cycle 7 of Nivolumab today. Patient reports that he has a fishing  trip planned from November 7th to the 20th and was asking how this would affect his treatment since his trip with be during a treatment. I informed patient that I will look at his treatment schedule and will follow up with him as soon as I have a better idea of the schedule. All patient's and spouses questions answered to their satisfaction, they deny further questions at this time and encourage them both to call Dr. Julien Nordmann or myself with any questions or concerns, and should patient have another episode of confusion to proceed to the Emergency room, both patient and spouse gave verbal understanding.  Study assigned Nivolumab vial numbers provided to Kennith Center, pharmacist. Infusion treatment sign given to Royce Macadamia, RN, all questions answered. Adele Dan, RN, BSN Clinical Research 09/01/2015 11:53 AM

## 2015-09-06 NOTE — Patient Instructions (Signed)
Increase your daily intake of fluids. Take a stool softener daily Follow up in 2 weeks prior to your next scheduled cycle of immunotherapy

## 2015-09-07 ENCOUNTER — Other Ambulatory Visit: Payer: Self-pay | Admitting: Medical Oncology

## 2015-09-07 DIAGNOSIS — C3411 Malignant neoplasm of upper lobe, right bronchus or lung: Secondary | ICD-10-CM

## 2015-09-14 ENCOUNTER — Other Ambulatory Visit: Payer: Self-pay | Admitting: Physician Assistant

## 2015-09-14 ENCOUNTER — Other Ambulatory Visit: Payer: Self-pay | Admitting: Medical Oncology

## 2015-09-14 ENCOUNTER — Telehealth: Payer: Self-pay | Admitting: Internal Medicine

## 2015-09-14 NOTE — Telephone Encounter (Signed)
Chemo added per pof and patient will get a new avs at 9/28

## 2015-09-15 ENCOUNTER — Encounter: Payer: Self-pay | Admitting: Physician Assistant

## 2015-09-15 ENCOUNTER — Telehealth: Payer: Self-pay | Admitting: Internal Medicine

## 2015-09-15 ENCOUNTER — Ambulatory Visit (HOSPITAL_BASED_OUTPATIENT_CLINIC_OR_DEPARTMENT_OTHER): Payer: Medicare Other | Admitting: Physician Assistant

## 2015-09-15 ENCOUNTER — Ambulatory Visit (HOSPITAL_BASED_OUTPATIENT_CLINIC_OR_DEPARTMENT_OTHER): Payer: Medicare Other

## 2015-09-15 ENCOUNTER — Other Ambulatory Visit (HOSPITAL_BASED_OUTPATIENT_CLINIC_OR_DEPARTMENT_OTHER): Payer: Medicare Other

## 2015-09-15 ENCOUNTER — Other Ambulatory Visit (HOSPITAL_COMMUNITY)
Admission: RE | Admit: 2015-09-15 | Discharge: 2015-09-15 | Disposition: A | Discharge status: Home / Self Care | Payer: Medicare Other | Source: Ambulatory Visit | Attending: Internal Medicine | Admitting: Internal Medicine

## 2015-09-15 ENCOUNTER — Encounter: Payer: Self-pay | Admitting: Medical Oncology

## 2015-09-15 VITALS — BP 162/82 | HR 100 | Temp 98.0°F | Resp 18 | Ht 66.0 in | Wt 144.9 lb

## 2015-09-15 DIAGNOSIS — Z006 Encounter for examination for normal comparison and control in clinical research program: Secondary | ICD-10-CM

## 2015-09-15 DIAGNOSIS — Z23 Encounter for immunization: Secondary | ICD-10-CM

## 2015-09-15 DIAGNOSIS — C3411 Malignant neoplasm of upper lobe, right bronchus or lung: Secondary | ICD-10-CM

## 2015-09-15 DIAGNOSIS — C7951 Secondary malignant neoplasm of bone: Secondary | ICD-10-CM

## 2015-09-15 DIAGNOSIS — Z5112 Encounter for antineoplastic immunotherapy: Secondary | ICD-10-CM

## 2015-09-15 LAB — COMPREHENSIVE METABOLIC PANEL (CC13)
ALT: 14 U/L (ref 0–55)
AST: 17 U/L (ref 5–34)
Albumin: 3.2 g/dL — ABNORMAL LOW (ref 3.5–5.0)
Alkaline Phosphatase: 93 U/L (ref 40–150)
Anion Gap: 8 mEq/L (ref 3–11)
BILIRUBIN TOTAL: 0.34 mg/dL (ref 0.20–1.20)
BUN: 10.5 mg/dL (ref 7.0–26.0)
CO2: 28 meq/L (ref 22–29)
Calcium: 10.1 mg/dL (ref 8.4–10.4)
Chloride: 103 mEq/L (ref 98–109)
Creatinine: 0.8 mg/dL (ref 0.7–1.3)
EGFR: 85 mL/min/{1.73_m2} — AB (ref >90)
GLUCOSE: 185 mg/dL — AB (ref 70–140)
Potassium: 3.8 mEq/L (ref 3.5–5.1)
SODIUM: 139 meq/L (ref 136–145)
TOTAL PROTEIN: 7.6 g/dL (ref 6.4–8.3)

## 2015-09-15 LAB — CBC WITH DIFFERENTIAL/PLATELET
BASO%: 1.5 % (ref 0.0–2.0)
Basophils Absolute: 0.1 10*3/uL (ref 0.0–0.1)
EOS%: 3.2 % (ref 0.0–7.0)
Eosinophils Absolute: 0.2 10*3/uL (ref 0.0–0.5)
HCT: 42.8 % (ref 38.4–49.9)
HEMOGLOBIN: 14.2 g/dL (ref 13.0–17.1)
LYMPH%: 23.2 % (ref 14.0–49.0)
MCH: 32.6 pg (ref 27.2–33.4)
MCHC: 33.3 g/dL (ref 32.0–36.0)
MCV: 98 fL (ref 79.3–98.0)
MONO#: 0.6 10*3/uL (ref 0.1–0.9)
MONO%: 8.2 % (ref 0.0–14.0)
NEUT%: 63.9 % (ref 39.0–75.0)
NEUTROS ABS: 4.7 10*3/uL (ref 1.5–6.5)
Platelets: 221 10*3/uL (ref 140–400)
RBC: 4.36 10*6/uL (ref 4.20–5.82)
RDW: 14 % (ref 11.0–14.6)
WBC: 7.4 10*3/uL (ref 4.0–10.3)
lymph#: 1.7 10*3/uL (ref 0.9–3.3)

## 2015-09-15 LAB — LACTATE DEHYDROGENASE (CC13): LDH: 128 U/L (ref 125–245)

## 2015-09-15 LAB — PHOSPHORUS: Phosphorus: 3.3 mg/dL (ref 2.5–4.6)

## 2015-09-15 LAB — MAGNESIUM (CC13): MAGNESIUM: 2.1 mg/dL (ref 1.5–2.5)

## 2015-09-15 MED ORDER — INFLUENZA VAC SPLIT QUAD 0.5 ML IM SUSY
0.5000 mL | PREFILLED_SYRINGE | Freq: Once | INTRAMUSCULAR | Status: AC
  Administered 2015-09-15: 0.5 mL via INTRAMUSCULAR
  Filled 2015-09-15: qty 0.5

## 2015-09-15 MED ORDER — SODIUM CHLORIDE 0.9 % IV SOLN
Freq: Once | INTRAVENOUS | Status: AC
  Administered 2015-09-15: 12:00:00 via INTRAVENOUS

## 2015-09-15 MED ORDER — SODIUM CHLORIDE 0.9 % IV SOLN
240.0000 mg | Freq: Once | INTRAVENOUS | Status: AC
  Administered 2015-09-15: 240 mg via INTRAVENOUS
  Filled 2015-09-15: qty 24

## 2015-09-15 NOTE — Telephone Encounter (Signed)
Pt confirmed labs/ov per 09/28 POF, gave pt AVS and Calendar... KJ °

## 2015-09-15 NOTE — Progress Notes (Signed)
BMS 370: Group A, Arm B3 Cycle 8/week 14 Patient here today for scheduled visit for labs, office visit and Cycle 8 treatment. Met with patient and patient's spouse upon arrival for office visit. Resting vitals obtained, O2 sat @ 98%, weight @ 65.7 kg (patient with a 2 lb weight gain), B/P elevated to 162/82, patient's PCP aware of elevated B/P. Patient with no new complaints or symtoms. Patient continues with his baseline fatigue and reports hasn't worsened. Patient does report ocassional SOB during exertion. Patient denies significant changes to bowels, states he has been taking Ex-lax daily since 09/14 as well as Miralax and reports this has improved his constipation. Patient does state that his left leg "still weak" compared to his right leg and states this has been an ongoing issue since long before start of study, states weakness improves with ambulation. Patient denies: nausea or vomiting, any neurological symptoms, skin rashes, pain, breathing changes, cough, eye issues. Spouse reports patient has approximately one weeks worth of Folic Acid left, per PA, Awilda Metro, patient can stop the Folic Acid when he runs out. Spouse inquired about improving patient's energy level and was advised patient can take an OTC B complex vitamin. Physical assessment of patient and review of his labs and NCS labs by PA and MD, per MD, patient cleared to receive Cycle 8 of Nivolumab today. All patient's and spouses question's answered to their satisfaction, they deny further questions at this time. Patient and spouse were thanked for their continued support of study and encouraged to call clinic, MD or myself with any questions or concerns. Study generated Nivolumab vial assignment provided to Pharmacist [name], infusion treatment sign with instructions provided to [facility] in infusion room, denies questions.  Cycle 9 scheduled for 10/12 and patient aware that next CT to be scheduled for the week of October  17th. Adele Dan, RN, BSN Clinical Research 09/15/2015 1:27 PM

## 2015-09-15 NOTE — Progress Notes (Addendum)
[place] [phone]   Fax:(336) [place], MD [address] [place] [address]  DIAGNOSIS: Stage IV (T3, N3, M1b) non-small cell lung cancer, adenocarcinoma diagnosed in January 2016 presented with right upper lobe lung mass in addition to mediastinal lymphadenopathy and large right pleural effusion as well as bone metastasis.  PRIOR THERAPY:  1. Status post Pleurx catheter placement by Dr. Servando Snare for drainage of the right pleural effusion.  2. Systemic chemotherapy with carboplatin for AUC of 5 and Alimta 500 MG/M2 every 3 weeks. Status post 6 cycles.  CURRENT THERAPY: Patient is participating in the [place] 370 clinical trial and has been randomized to group A arm B-3 to receive Nivolumab 240 mg given every 2 weeks. First cycle expected 06/10/2015. Status post 7 cycles.  INTERVAL HISTORY: Omar Peters 80 y.o. male returns to the clinic today for follow-up visit accompanied by his wife. She reports he had some confusion over the weekend.  His symptoms improved after drinking more fluids including some Pediolyte. He admits to not drinking fluids as he should.The patient is feeling fine today with no specific complaints.  His weight is a stable. He denied having any significant chest pain, shortness of breath, cough or hemoptysis.  He is tolerating his treatment with Nivolumab fairly well with no significant adverse effects. The patient denied having any significant skin rash, diarrhea. He has no nausea or vomiting. He did have one episode of diarrhea after taking MiraLax and Exlax. No further episodes. No significant headache or visual changes. He does report some constipation. He requests a flu shot today.  MEDICAL HISTORY: Past Medical History  Diagnosis Date  . Hypertension   . GERD (gastroesophageal reflux disease)   . Shortness of breath dyspnea     01/14/15 at night and has to sit up  . Anxiety    due to diagnosis of Stage 4 Lung Cancer  . Macular degeneration of right eye   . Cancer     Lung  . Diabetes mellitus without complication     ALLERGIES:  is allergic to amitriptyline and marinol.  MEDICATIONS:  Current Outpatient Prescriptions  Medication Sig Dispense Refill  . aspirin 81 MG tablet Take 81 mg by mouth daily.    . folic acid (FOLVITE) 1 MG tablet Take 1 tablet (1 mg total) by mouth daily. 90 tablet 0  . HYDROcodone-acetaminophen (NORCO) 5-325 MG per tablet Take 1 tablet by mouth every 6 (six) hours as needed for moderate pain. 30 tablet 0  . magnesium hydroxide (MILK OF MAGNESIA) 400 MG/5ML suspension Take by mouth daily as needed for mild constipation.    . metFORMIN (GLUCOPHAGE-XR) 500 MG 24 hr tablet Take 500 mg by mouth daily with breakfast.    . prochlorperazine (COMPAZINE) 10 MG tablet TAKE ONE TABLET BY MOUTH EVERY 6 HOURS AS NEEDED FOR NAUSEA AND VOMITING 30 tablet 0  . ranitidine (ZANTAC) 150 MG tablet Take 150 mg by mouth daily.    Marland Kitchen senna-docusate (SENNA S) 8.6-50 MG per tablet Take 2 tablets by mouth daily.     No current facility-administered medications for this visit.    SURGICAL HISTORY:  Past Surgical History  Procedure Laterality Date  . Vein ligation and stripping  1977  . Chest tube insertion Right 01/15/2015    Procedure: INSERTION PLEURAL DRAINAGE CATHETER WITH ULTRASOUND AND FLURO GUIDANCE;  Surgeon: Grace Isaac, MD;  Location: [facility];  Service: Thoracic;  Laterality: Right;  . Removal of pleural drainage catheter Right 03/26/2015    Procedure: REMOVAL OF PLEURAL DRAINAGE CATHETER;  Surgeon: Grace Isaac, MD;  Location: [facility];  Service: Thoracic;  Laterality: Right;    REVIEW OF SYSTEMS:  Constitutional: negative Eyes: negative Ears, nose, mouth, throat, and face: negative Respiratory: negative Cardiovascular: negative Gastrointestinal: positive for diarrhea and one episode after taking both MiraLax and  Exlax Genitourinary:negative Integument/breast: negative Hematologic/lymphatic: negative Musculoskeletal:negative Neurological: positive for mild confusion over the weekend that improved with hydration Behavioral/Psych: negative Endocrine: negative Allergic/Immunologic: negative   PHYSICAL EXAMINATION: General appearance: alert, cooperative and no distress Head: Normocephalic, without obvious abnormality, atraumatic Neck: no adenopathy, no JVD, supple, symmetrical, trachea midline and thyroid not enlarged, symmetric, no tenderness/mass/nodules Lymph nodes: Cervical, supraclavicular, and axillary nodes normal. Resp: clear to auscultation bilaterally Back: symmetric, no curvature. ROM normal. No CVA tenderness. Cardio: regular rate and rhythm, S1, S2 normal, no murmur, click, rub or gallop GI: soft, non-tender; bowel sounds normal; no masses,  no organomegaly Extremities: extremities normal, atraumatic, no cyanosis or edema Neurologic: Alert and oriented X 3, normal strength and tone. Normal symmetric reflexes. Normal coordination and gait  ECOG PERFORMANCE STATUS: 1 - Symptomatic but completely ambulatory  Blood pressure 162/82, pulse 100, temperature 98 F (36.7 C), temperature source Oral, resp. rate 18, height '5\' 6"'$  (1.676 m), weight 144 lb 14.4 oz (65.726 kg), SpO2 98 %.  LABORATORY DATA: Lab Results  Component Value Date   WBC 7.4 09/15/2015   HGB 14.2 09/15/2015   HCT 42.8 09/15/2015   MCV 98.0 09/15/2015   PLT 221 09/15/2015      Chemistry      Component Value Date/Time   NA 139 09/15/2015 0914   NA 138 01/15/2015 0608   K 3.8 09/15/2015 0914   K 3.5 01/15/2015 0608   CL 97 01/15/2015 0608   CO2 28 09/15/2015 0914   CO2 34* 01/15/2015 0608   BUN 10.5 09/15/2015 0914   BUN 13 01/15/2015 0608   CREATININE 0.8 09/15/2015 0914   CREATININE 0.96 01/15/2015 0608   GLU 231* 03/10/2015 1539      Component Value Date/Time   CALCIUM 10.1 09/15/2015 0914   CALCIUM 9.4  01/15/2015 0608   ALKPHOS 93 09/15/2015 0914   ALKPHOS 102 01/15/2015 0608   AST 17 09/15/2015 0914   AST 42* 01/15/2015 0608   ALT 14 09/15/2015 0914   ALT 38 01/15/2015 0608   BILITOT 0.34 09/15/2015 0914   BILITOT 0.8 01/15/2015 0608       RADIOGRAPHIC STUDIES: No results found.  ASSESSMENT AND PLAN: This is a very pleasant 80 years old white male with history of stage IV non-small cell lung cancer, adenocarcinoma status post induction chemotherapy was carboplatin and Alimta and currently undergoing treatment with immunotherapy according to the BMS checkmated 370 clinical trial. He was randomized to the Nivolumab arm 240 mg IV every 2 weeks. He is status post 7 cycles and tolerating his treatment fairly well. The recent CT scan of the chest, abdomen and pelvis showed improvement in his disease with decrease in the size of the dominant right upper lobe pulmonary parenchymal mass as well as mediastinal lymphadenopathy. The patient was discussed with and also seen by Dr. Julien Nordmann. He willI  proceed with cycle #8 of his immunotherapy today as scheduled. He will return for a follow-up visit in 2 weeks for reevaluation and management of any adverse effect of his treatment, prior to cycle #9.He  will be given a flu vaccine today. The patient was advised to call immediately if he has any concerning symptoms in the interval. The patient voices understanding of current disease status and treatment options and is in agreement with the current care plan.  All questions were answered. The patient knows to call the clinic with any problems, questions or concerns. We can certainly see the patient much sooner if necessary.  Carlton Adam, PA-C 09/15/2015  ADDENDUM: Hematology/Oncology Attending: I had a face to face encounter with the patient. I recommended his care plan. This is a very pleasant 80 years old white male with a stage IV non-small cell lung cancer, adenocarcinoma currently undergoing  treatment with Nivolumab on a clinical trial status post 7 cycles. He tolerated the last cycle of his treatment fairly well with no significant adverse effects. I recommended for the patient to proceed with cycle #8 today as scheduled. He would come back for follow-up visit in 2 weeks for reevaluation before starting cycle #9. Unfortunately the patient continues to smoke and I strongly encouraged him to quit smoking and offered him to smoke cessation program. The patient would also receive flu vaccine today. He was advised to call immediately if he has any concerning symptoms in the interval.  Disclaimer: This note was dictated with voice recognition software. Similar sounding words can inadvertently be transcribed and may not be corrected upon review. Eilleen Kempf., MD 09/17/2015

## 2015-09-15 NOTE — Patient Instructions (Signed)
Follow up in 2 weeks, prior to your next scheduled cycle of immunotherapy 

## 2015-09-15 NOTE — Patient Instructions (Signed)
[facility] Discharge Instructions for Patients Receiving Chemotherapy  Today you received the following chemotherapy agents:  Nivolumab.  To help prevent nausea and vomiting after your treatment, we encourage you to take your nausea medication as directed.   If you develop nausea and vomiting that is not controlled by your nausea medication, call the clinic.   BELOW ARE SYMPTOMS THAT SHOULD BE REPORTED IMMEDIATELY:  *FEVER GREATER THAN 100.5 F  *CHILLS WITH OR WITHOUT FEVER  NAUSEA AND VOMITING THAT IS NOT CONTROLLED WITH YOUR NAUSEA MEDICATION  *UNUSUAL SHORTNESS OF BREATH  *UNUSUAL BRUISING OR BLEEDING  TENDERNESS IN MOUTH AND THROAT WITH OR WITHOUT PRESENCE OF ULCERS  *URINARY PROBLEMS  *BOWEL PROBLEMS  UNUSUAL RASH Items with * indicate a potential emergency and should be followed up as soon as possible.  Feel free to call the clinic you have any questions or concerns. The clinic phone number is [phone].  Please show the CHEMO ALERT CARD at check-in to the Emergency Department and triage nurse.   

## 2015-09-23 MED ORDER — POTASSIUM CHLORIDE CRYS ER 20 MEQ PO TBCR: 20.0000 meq | EXTENDED_RELEASE_TABLET | Freq: Once | ORAL | Status: DC

## 2015-09-27 ENCOUNTER — Other Ambulatory Visit: Payer: Self-pay | Admitting: Internal Medicine

## 2015-09-27 ENCOUNTER — Encounter: Payer: Self-pay | Admitting: Medical Oncology

## 2015-09-27 DIAGNOSIS — C3411 Malignant neoplasm of upper lobe, right bronchus or lung: Secondary | ICD-10-CM

## 2015-09-29 ENCOUNTER — Telehealth: Payer: Self-pay | Admitting: Internal Medicine

## 2015-09-29 ENCOUNTER — Other Ambulatory Visit (HOSPITAL_BASED_OUTPATIENT_CLINIC_OR_DEPARTMENT_OTHER): Payer: Medicare Other

## 2015-09-29 ENCOUNTER — Ambulatory Visit (HOSPITAL_BASED_OUTPATIENT_CLINIC_OR_DEPARTMENT_OTHER): Payer: Medicare Other

## 2015-09-29 ENCOUNTER — Encounter: Payer: Self-pay | Admitting: Medical Oncology

## 2015-09-29 ENCOUNTER — Ambulatory Visit (HOSPITAL_BASED_OUTPATIENT_CLINIC_OR_DEPARTMENT_OTHER): Payer: Medicare Other | Admitting: Internal Medicine

## 2015-09-29 ENCOUNTER — Other Ambulatory Visit (HOSPITAL_COMMUNITY)
Admission: RE | Admit: 2015-09-29 | Discharge: 2015-09-29 | Disposition: A | Discharge status: Home / Self Care | Payer: Medicare Other | Source: Ambulatory Visit | Attending: Internal Medicine | Admitting: Internal Medicine

## 2015-09-29 ENCOUNTER — Encounter: Payer: Self-pay | Admitting: Internal Medicine

## 2015-09-29 VITALS — BP 147/67 | HR 98 | Temp 98.0°F | Resp 18 | Ht 66.0 in | Wt 145.3 lb

## 2015-09-29 DIAGNOSIS — Z5112 Encounter for antineoplastic immunotherapy: Secondary | ICD-10-CM

## 2015-09-29 DIAGNOSIS — C3411 Malignant neoplasm of upper lobe, right bronchus or lung: Secondary | ICD-10-CM | POA: Insufficient documentation

## 2015-09-29 DIAGNOSIS — Z006 Encounter for examination for normal comparison and control in clinical research program: Secondary | ICD-10-CM

## 2015-09-29 DIAGNOSIS — C7951 Secondary malignant neoplasm of bone: Secondary | ICD-10-CM | POA: Diagnosis not present

## 2015-09-29 LAB — CBC WITH DIFFERENTIAL/PLATELET
BASO%: 1.1 % (ref 0.0–2.0)
Basophils Absolute: 0.1 10*3/uL (ref 0.0–0.1)
EOS%: 4 % (ref 0.0–7.0)
Eosinophils Absolute: 0.3 10*3/uL (ref 0.0–0.5)
HCT: 42.9 % (ref 38.4–49.9)
HGB: 14.4 g/dL (ref 13.0–17.1)
LYMPH%: 37.3 % (ref 14.0–49.0)
MCH: 33.1 pg (ref 27.2–33.4)
MCHC: 33.6 g/dL (ref 32.0–36.0)
MCV: 98.6 fL — ABNORMAL HIGH (ref 79.3–98.0)
MONO#: 0.7 10*3/uL (ref 0.1–0.9)
MONO%: 11 % (ref 0.0–14.0)
NEUT%: 46.6 % (ref 39.0–75.0)
NEUTROS ABS: 3.1 10*3/uL (ref 1.5–6.5)
Platelets: 218 10*3/uL (ref 140–400)
RBC: 4.35 10*6/uL (ref 4.20–5.82)
RDW: 14.3 % (ref 11.0–14.6)
WBC: 6.6 10*3/uL (ref 4.0–10.3)
lymph#: 2.5 10*3/uL (ref 0.9–3.3)

## 2015-09-29 LAB — COMPREHENSIVE METABOLIC PANEL (CC13)
ALT: 13 U/L (ref 0–55)
AST: 16 U/L (ref 5–34)
Albumin: 3.2 g/dL — ABNORMAL LOW (ref 3.5–5.0)
Alkaline Phosphatase: 93 U/L (ref 40–150)
Anion Gap: 8 mEq/L (ref 3–11)
BUN: 12.3 mg/dL (ref 7.0–26.0)
CALCIUM: 9.7 mg/dL (ref 8.4–10.4)
CHLORIDE: 106 meq/L (ref 98–109)
CO2: 28 meq/L (ref 22–29)
CREATININE: 0.8 mg/dL (ref 0.7–1.3)
EGFR: 85 mL/min/{1.73_m2} — ABNORMAL LOW (ref >90)
GLUCOSE: 128 mg/dL (ref 70–140)
POTASSIUM: 3.8 meq/L (ref 3.5–5.1)
SODIUM: 141 meq/L (ref 136–145)
Total Bilirubin: 0.31 mg/dL (ref 0.20–1.20)
Total Protein: 7.4 g/dL (ref 6.4–8.3)

## 2015-09-29 LAB — MAGNESIUM (CC13): MAGNESIUM: 2.1 mg/dL (ref 1.5–2.5)

## 2015-09-29 LAB — LACTATE DEHYDROGENASE (CC13): LDH: 119 U/L — AB (ref 125–245)

## 2015-09-29 LAB — PHOSPHORUS: PHOSPHORUS: 3.5 mg/dL (ref 2.5–4.6)

## 2015-09-29 MED ORDER — SODIUM CHLORIDE 0.9 % IV SOLN
240.0000 mg | Freq: Once | INTRAVENOUS | Status: AC
  Administered 2015-09-29: 240 mg via INTRAVENOUS
  Filled 2015-09-29: qty 24

## 2015-09-29 MED ORDER — SODIUM CHLORIDE 0.9 % IV SOLN
Freq: Once | INTRAVENOUS | Status: AC
  Administered 2015-09-29: 15:00:00 via INTRAVENOUS

## 2015-09-29 NOTE — Progress Notes (Signed)
[name] Telephone:(336) [phone]   Fax:(336) [place], MD [address] [place] [address]  DIAGNOSIS: Stage IV (T3, N3, M1b) non-small cell lung cancer, adenocarcinoma diagnosed in January 2016 presented with right upper lobe lung mass in addition to mediastinal lymphadenopathy and large right pleural effusion as well as bone metastasis.  PRIOR THERAPY:  1. Status post Pleurx catheter placement by Dr. Servando Snare for drainage of the right pleural effusion.  2. Systemic chemotherapy with carboplatin for AUC of 5 and Alimta 500 MG/M2 every 3 weeks. Status post 6 cycles.  CURRENT THERAPY: Patient is participating in the [place] 370 clinical trial and has been randomized to group A arm B-3 to receive Nivolumab 240 mg given every 2 weeks. First cycle expected 06/10/2015. Status post 8 cycles.  INTERVAL HISTORY: Omar Peters 80 y.o. male returns to the clinic today for follow-up visit accompanied by his wife. The patient is feeling fine today with no specific complaints. He is tolerating his treatment with Nivolumab fairly well with no significant adverse effects. The patient denied having any significant skin rash, diarrhea. He has no nausea or vomiting. No significant headache or visual changes. He has no chest pain, shortness of breath, cough or hemoptysis. He has no significant weight loss or night sweats. He is here today to start cycle #9 of his immunotherapy.  MEDICAL HISTORY: Past Medical History  Diagnosis Date  . Hypertension   . GERD (gastroesophageal reflux disease)   . Shortness of breath dyspnea     01/14/15 at night and has to sit up  . Anxiety     due to diagnosis of Stage 4 Lung Cancer  . Macular degeneration of right eye   . Cancer ([facility])     Lung  . Diabetes mellitus without complication (H[facility])     ALLERGIES:  is allergic to amitriptyline and marinol.  MEDICATIONS:  Current Outpatient Prescriptions    Medication Sig Dispense Refill  . amitriptyline (ELAVIL) 10 MG tablet     . aspirin 81 MG tablet Take 81 mg by mouth daily.    Marland Kitchen HYDROcodone-acetaminophen (NORCO) 5-325 MG per tablet Take 1 tablet by mouth every 6 (six) hours as needed for moderate pain. 30 tablet 0  . magnesium hydroxide (MILK OF MAGNESIA) 400 MG/5ML suspension Take by mouth daily as needed for mild constipation.    . metFORMIN (GLUCOPHAGE) 500 MG tablet     . prochlorperazine (COMPAZINE) 10 MG tablet TAKE ONE TABLET BY MOUTH EVERY 6 HOURS AS NEEDED FOR NAUSEA AND VOMITING 30 tablet 0  . ranitidine (ZANTAC) 150 MG tablet Take 150 mg by mouth daily.    Marland Kitchen senna-docusate (SENNA S) 8.6-50 MG per tablet Take 2 tablets by mouth daily.    . metFORMIN (GLUCOPHAGE-XR) 500 MG 24 hr tablet Take 500 mg by mouth daily with breakfast.     No current facility-administered medications for this visit.    SURGICAL HISTORY:  Past Surgical History  Procedure Laterality Date  . Vein ligation and stripping  1977  . Chest tube insertion Right 01/15/2015    Procedure: INSERTION PLEURAL DRAINAGE CATHETER WITH ULTRASOUND AND FLURO GUIDANCE;  Surgeon: Grace Isaac, MD;  Location: [facility];  Service: Thoracic;  Laterality: Right;  . Removal of pleural drainage catheter Right 03/26/2015    Procedure: REMOVAL OF PLEURAL DRAINAGE CATHETER;  Surgeon: Grace Isaac, MD;  Location: [facility];  Service: Thoracic;  Laterality:  Right;    REVIEW OF SYSTEMS:  A comprehensive review of systems was negative.   PHYSICAL EXAMINATION: General appearance: alert, cooperative and no distress Head: Normocephalic, without obvious abnormality, atraumatic Neck: no adenopathy, no JVD, supple, symmetrical, trachea midline and thyroid not enlarged, symmetric, no tenderness/mass/nodules Lymph nodes: Cervical, supraclavicular, and axillary nodes normal. Resp: clear to auscultation bilaterally Back: symmetric, no curvature. ROM normal. No CVA tenderness. Cardio: regular  rate and rhythm, S1, S2 normal, no murmur, click, rub or gallop GI: soft, non-tender; bowel sounds normal; no masses,  no organomegaly Extremities: extremities normal, atraumatic, no cyanosis or edema Neurologic: Alert and oriented X 3, normal strength and tone. Normal symmetric reflexes. Normal coordination and gait  ECOG PERFORMANCE STATUS: 1 - Symptomatic but completely ambulatory  Blood pressure 147/67, pulse 98, temperature 98 F (36.7 C), temperature source Oral, resp. rate 18, height '5\' 6"'$  (1.676 m), weight 145 lb 4.8 oz (65.908 kg), SpO2 98 %.  LABORATORY DATA: Lab Results  Component Value Date   WBC 6.6 09/29/2015   HGB 14.4 09/29/2015   HCT 42.9 09/29/2015   MCV 98.6* 09/29/2015   PLT 218 09/29/2015      Chemistry      Component Value Date/Time   NA 141 09/29/2015 1254   NA 138 01/15/2015 0608   K 3.8 09/29/2015 1254   K 3.5 01/15/2015 0608   CL 97 01/15/2015 0608   CO2 28 09/29/2015 1254   CO2 34* 01/15/2015 0608   BUN 12.3 09/29/2015 1254   BUN 13 01/15/2015 0608   CREATININE 0.8 09/29/2015 1254   CREATININE 0.96 01/15/2015 0608   GLU 231* 03/10/2015 1539      Component Value Date/Time   CALCIUM 9.7 09/29/2015 1254   CALCIUM 9.4 01/15/2015 0608   ALKPHOS 93 09/29/2015 1254   ALKPHOS 102 01/15/2015 0608   AST 16 09/29/2015 1254   AST 42* 01/15/2015 0608   ALT 13 09/29/2015 1254   ALT 38 01/15/2015 0608   BILITOT 0.31 09/29/2015 1254   BILITOT 0.8 01/15/2015 0608       RADIOGRAPHIC STUDIES: No results found.  ASSESSMENT AND PLAN: This is a very pleasant 80 years old white male with history of stage IV non-small cell lung cancer, adenocarcinoma status post induction chemotherapy was carboplatin and Alimta and currently undergoing treatment with immunotherapy according to the BMS checkmated 370 clinical trial. He was randomized to the Nivolumab arm 240 mg IV every 2 weeks. He is status post 8 cycles and tolerating his treatment fairly well. I recommended  for the patient to proceed with cycle #9 of his immunotherapy today as scheduled. He would come back for follow-up visit in 2 weeks for reevaluation after repeating CT scan of the chest, abdomen and pelvis for restaging of his disease.. The patient was advised to call immediately if he has any concerning symptoms in the interval. The patient voices understanding of current disease status and treatment options and is in agreement with the current care plan.  All questions were answered. The patient knows to call the clinic with any problems, questions or concerns. We can certainly see the patient much sooner if necessary.  Disclaimer: This note was dictated with voice recognition software. Similar sounding words can inadvertently be transcribed and may not be corrected upon review.

## 2015-09-29 NOTE — Telephone Encounter (Signed)
per pof to sch pt appt-gave tp copy of avs-sent MW email to sch trmt-pt aware of appt

## 2015-09-29 NOTE — Progress Notes (Signed)
BMS 370: Group A, Arm B3, Cycle 9/week 16 Patient here today for labs, MD visit and Cycle 9 treatment. I met patient and spouse in exam room after resting vital signs completed; O2 saturation @ 98%, B/P @ 147/67 (grade 2, patient's baseline), and patient maintaining weight. Patient reports to have seen his PCP yesterday and states that per his PCP, PCP will follow patient's B/P. Patient could not remember if PCP prescribed any any B/P medications and spouse states as of today she had not heard anything from their pharmacy regarding a new prescription, states she will let me know if there is one. Patient reports to have stopped taking Folic Acid as of 09/29 and has started taking an OTC B-complex on 09/29, patient reports no other changes in his medications. Patient denies any new adverse events and states he "feels pretty good." Patient also reports a slight increase in his energy. Patient denies pain, denies nausea or vomiting, denies cough, denies diarrhea and continues with his baseline constipation, denies rash, denies neuro problems/headaches/, and reports SOB with exertion only. Per Dr. Mohamed's physical assessment of patient and review of patient's labs as well as NCS labs, patient to proceed with Cycle 9 treatment today. I confirmed with patient his CT appointment next week and that patient did receive the contrast today, with directions, for that appointment. All patient's and spouses questions answered to their satisfaction and were encouraged to contact Dr. Mohamed or myself with any questions or concerns. Study generated Nivolumab vial assignment sheet provided to Pharmacist Brandy Persson, infusion information form given to RN, Valerie, all questions answered.  , , RN, BSN Clinical Research 09/29/2015 3:48 PM    

## 2015-09-30 ENCOUNTER — Telehealth: Payer: Self-pay | Admitting: *Deleted

## 2015-09-30 NOTE — Telephone Encounter (Signed)
Per staff message and POF I have scheduled appts. Advised scheduler of appts. JMW  

## 2015-10-06 ENCOUNTER — Ambulatory Visit (HOSPITAL_COMMUNITY)
Admission: RE | Admit: 2015-10-06 | Discharge: 2015-10-06 | Disposition: A | Discharge status: Home / Self Care | Payer: Medicare Other | Source: Ambulatory Visit | Attending: Internal Medicine | Admitting: Internal Medicine

## 2015-10-06 ENCOUNTER — Encounter (HOSPITAL_COMMUNITY): Payer: Self-pay

## 2015-10-06 DIAGNOSIS — M899 Disorder of bone, unspecified: Secondary | ICD-10-CM | POA: Insufficient documentation

## 2015-10-06 DIAGNOSIS — Z08 Encounter for follow-up examination after completed treatment for malignant neoplasm: Secondary | ICD-10-CM | POA: Diagnosis not present

## 2015-10-06 DIAGNOSIS — C3411 Malignant neoplasm of upper lobe, right bronchus or lung: Secondary | ICD-10-CM | POA: Insufficient documentation

## 2015-10-06 MED ORDER — IOHEXOL 300 MG/ML  SOLN
100.0000 mL | Freq: Once | INTRAMUSCULAR | Status: AC | PRN
Start: 2015-10-06 — End: 2015-10-06
  Administered 2015-10-06: 100 mL via INTRAVENOUS

## 2015-10-12 ENCOUNTER — Other Ambulatory Visit: Payer: Self-pay | Admitting: Medical Oncology

## 2015-10-12 DIAGNOSIS — C3411 Malignant neoplasm of upper lobe, right bronchus or lung: Secondary | ICD-10-CM

## 2015-10-13 ENCOUNTER — Other Ambulatory Visit: Payer: Self-pay | Admitting: *Deleted

## 2015-10-13 ENCOUNTER — Ambulatory Visit (HOSPITAL_BASED_OUTPATIENT_CLINIC_OR_DEPARTMENT_OTHER): Payer: Medicare Other | Admitting: Internal Medicine

## 2015-10-13 ENCOUNTER — Telehealth: Payer: Self-pay | Admitting: Physician Assistant

## 2015-10-13 ENCOUNTER — Encounter: Payer: Self-pay | Admitting: Internal Medicine

## 2015-10-13 ENCOUNTER — Encounter: Payer: Self-pay | Admitting: *Deleted

## 2015-10-13 ENCOUNTER — Ambulatory Visit (HOSPITAL_BASED_OUTPATIENT_CLINIC_OR_DEPARTMENT_OTHER): Payer: Medicare Other

## 2015-10-13 ENCOUNTER — Encounter: Payer: Self-pay | Admitting: Oncology

## 2015-10-13 ENCOUNTER — Encounter: Payer: Self-pay | Admitting: Medical Oncology

## 2015-10-13 ENCOUNTER — Other Ambulatory Visit (HOSPITAL_COMMUNITY)
Admission: RE | Admit: 2015-10-13 | Discharge: 2015-10-13 | Disposition: A | Discharge status: Home / Self Care | Payer: Medicare Other | Source: Ambulatory Visit | Attending: Internal Medicine | Admitting: Internal Medicine

## 2015-10-13 ENCOUNTER — Other Ambulatory Visit: Payer: Self-pay | Admitting: Medical Oncology

## 2015-10-13 ENCOUNTER — Other Ambulatory Visit (HOSPITAL_BASED_OUTPATIENT_CLINIC_OR_DEPARTMENT_OTHER): Payer: Medicare Other

## 2015-10-13 VITALS — BP 159/84 | HR 102 | Temp 97.7°F | Resp 18 | Ht 66.0 in | Wt 145.7 lb

## 2015-10-13 DIAGNOSIS — C3411 Malignant neoplasm of upper lobe, right bronchus or lung: Secondary | ICD-10-CM

## 2015-10-13 DIAGNOSIS — Z006 Encounter for examination for normal comparison and control in clinical research program: Secondary | ICD-10-CM

## 2015-10-13 DIAGNOSIS — C7951 Secondary malignant neoplasm of bone: Secondary | ICD-10-CM

## 2015-10-13 DIAGNOSIS — Z5112 Encounter for antineoplastic immunotherapy: Secondary | ICD-10-CM

## 2015-10-13 LAB — T3, FREE: T3, Free: 2.4 pg/mL (ref 2.3–4.2)

## 2015-10-13 LAB — CBC WITH DIFFERENTIAL/PLATELET
BASO%: 1.5 % (ref 0.0–2.0)
BASOS ABS: 0.1 10*3/uL (ref 0.0–0.1)
EOS ABS: 0.3 10*3/uL (ref 0.0–0.5)
EOS%: 4 % (ref 0.0–7.0)
HCT: 43.5 % (ref 38.4–49.9)
HEMOGLOBIN: 14.6 g/dL (ref 13.0–17.1)
LYMPH%: 25.9 % (ref 14.0–49.0)
MCH: 32.9 pg (ref 27.2–33.4)
MCHC: 33.5 g/dL (ref 32.0–36.0)
MCV: 98.4 fL — AB (ref 79.3–98.0)
MONO#: 0.9 10*3/uL (ref 0.1–0.9)
MONO%: 11.2 % (ref 0.0–14.0)
NEUT#: 4.5 10*3/uL (ref 1.5–6.5)
NEUT%: 57.4 % (ref 39.0–75.0)
Platelets: 240 10*3/uL (ref 140–400)
RBC: 4.42 10*6/uL (ref 4.20–5.82)
RDW: 14.2 % (ref 11.0–14.6)
WBC: 7.8 10*3/uL (ref 4.0–10.3)
lymph#: 2 10*3/uL (ref 0.9–3.3)

## 2015-10-13 LAB — COMPREHENSIVE METABOLIC PANEL (CC13)
ALT: 15 U/L (ref 0–55)
ANION GAP: 6 meq/L (ref 3–11)
AST: 16 U/L (ref 5–34)
Albumin: 3.2 g/dL — ABNORMAL LOW (ref 3.5–5.0)
Alkaline Phosphatase: 94 U/L (ref 40–150)
BUN: 13.8 mg/dL (ref 7.0–26.0)
CALCIUM: 10.4 mg/dL (ref 8.4–10.4)
CHLORIDE: 106 meq/L (ref 98–109)
CO2: 30 meq/L — AB (ref 22–29)
CREATININE: 0.9 mg/dL (ref 0.7–1.3)
EGFR: 80 mL/min/{1.73_m2} — AB (ref >90)
Glucose: 160 mg/dl — ABNORMAL HIGH (ref 70–140)
POTASSIUM: 3.8 meq/L (ref 3.5–5.1)
Sodium: 142 mEq/L (ref 136–145)
Total Bilirubin: 0.31 mg/dL (ref 0.20–1.20)
Total Protein: 7.4 g/dL (ref 6.4–8.3)

## 2015-10-13 LAB — MAGNESIUM (CC13): MAGNESIUM: 2.2 mg/dL (ref 1.5–2.5)

## 2015-10-13 LAB — TSH CHCC: TSH: 5.592 m[IU]/L — AB (ref 0.320–4.118)

## 2015-10-13 LAB — T4, FREE: FREE T4: 0.72 ng/dL — AB (ref 0.80–1.80)

## 2015-10-13 LAB — LACTATE DEHYDROGENASE (CC13): LDH: 118 U/L — AB (ref 125–245)

## 2015-10-13 LAB — PHOSPHORUS: Phosphorus: 3 mg/dL (ref 2.5–4.6)

## 2015-10-13 LAB — RESEARCH LABS

## 2015-10-13 MED ORDER — SODIUM CHLORIDE 0.9 % IV SOLN
Freq: Once | INTRAVENOUS | Status: AC
  Administered 2015-10-13: 13:00:00 via INTRAVENOUS

## 2015-10-13 MED ORDER — SODIUM CHLORIDE 0.9 % IV SOLN
240.0000 mg | Freq: Once | INTRAVENOUS | Status: AC
  Administered 2015-10-13: 240 mg via INTRAVENOUS
  Filled 2015-10-13: qty 24

## 2015-10-13 NOTE — Progress Notes (Signed)
BMS 370: Group A, Arm B3 Cycle 10/week 18 I met patient and spouse during patient's resting vitals assessment and after patient completed PRO's with research assistant Remer Macho and had cycle 10 study required lab draws. B/P= 159/84, HR=102 Resp=18, O2 sat @ 97%, Temp= 97.7 and weight @ 145.11 lbs. Patient denies any new concomitant medications. Patient denies any new symptoms or adverse events. Patient does report his appetite and energy with slight improvement. Patient denies; pain, nausea, vomiting, SOB, cough, rash, bladder concerns, neurological issues, swelling. Patient continues with baseline constipation. Dr. Julien Nordmann reviewed with patient and spouse patient's recent Ct scan "showed no significant evidence for disease progression except for enlarging lytic lesion in the right iliac bone which could be disease progression versus healing process any bone lesion. The scan also showed decrease in the size of the right upper lobe mass." Per MD's assessment of patient, CT scan and review of labs and NCS labs, patient ok to proceed today with Cycle 10 of NiIvolumab today. Patient's TSH resulted elevated, Free T3 and Free T4 ordered, per proctol, pending results. I reviewed CT scan report with MD due to it stating "the residual right upper lobe mass is partially obscured by adjacent postobstructive pneumonitis or atelectasis..." Per MD, patient negative for pneumonitis. Atelectasis present on 08/24 Ct scan and patient assymptomatic.  All of patient's and spouses questions answered to their satisfaction. I thanked patient for their continued support of study and encouraged them to contact Dr. Julien Nordmann or myself with any questions or concerns.  Study protocol generated vial assignment sheet given to Pharmacist Mosetta Pigeon. Infusion information sheet given to RN, Dawn in treatment room. Patient to return for Cycle 11 on November 7th.  Adele Dan, RN, BSN Clinical Research 10/13/2015 2:50 PM

## 2015-10-13 NOTE — Progress Notes (Signed)
[place] [phone]   Fax:([phone]) [place], MD [address] [place] [address]  DIAGNOSIS: Stage IV (T3, N3, M1b) non-small cell lung cancer, adenocarcinoma diagnosed in January 2016 presented with right upper lobe lung mass in addition to mediastinal lymphadenopathy and large right pleural effusion as well as bone metastasis.  PRIOR THERAPY:  1. Status post Pleurx catheter placement by Dr. Servando Snare for drainage of the right pleural effusion.  2. Systemic chemotherapy with carboplatin for AUC of 5 and Alimta 500 MG/M2 every 3 weeks. Status post 6 cycles.  CURRENT THERAPY: Patient is participating in the [place] 370 clinical trial and has been randomized to group A arm B-3 to receive Nivolumab 240 mg given every 2 weeks. First cycle expected 06/10/2015. Status post 9 cycles.  INTERVAL HISTORY: ZACK CRAGER 80 y.o. male returns to the clinic today for follow-up visit accompanied by his wife. The patient is feeling fine today with no specific complaints. He is tolerating his treatment with Nivolumab fairly well with no significant adverse effects. The patient denied having any significant skin rash, diarrhea. He has no nausea or vomiting. No significant headache or visual changes. He has no chest pain, shortness of breath, cough or hemoptysis. He has no significant weight loss or night sweats. He had repeat CT scan of the chest, abdomen and pelvis performed recently and he is here for evaluation and discussion of his scan results.  MEDICAL HISTORY: Past Medical History  Diagnosis Date  . Hypertension   . GERD (gastroesophageal reflux disease)   . Shortness of breath dyspnea     01/14/15 at night and has to sit up  . Anxiety     due to diagnosis of Stage 4 Lung Cancer  . Macular degeneration of right eye   . Cancer ([place])     Lung  . Diabetes mellitus without complication (HCC)     ALLERGIES:  is allergic  to amitriptyline and marinol.  MEDICATIONS:  Current Outpatient Prescriptions  Medication Sig Dispense Refill  . amitriptyline (ELAVIL) 10 MG tablet     . aspirin 81 MG tablet Take 81 mg by mouth daily.    . B Complex-Biotin-FA (B-COMPLEX PO) Take 1 each by mouth daily.    Marland Kitchen HYDROcodone-acetaminophen (NORCO) 5-325 MG per tablet Take 1 tablet by mouth every 6 (six) hours as needed for moderate pain. 30 tablet 0  . magnesium hydroxide (MILK OF MAGNESIA) 400 MG/5ML suspension Take by mouth daily as needed for mild constipation.    . metFORMIN (GLUCOPHAGE) 500 MG tablet     . metFORMIN (GLUCOPHAGE-XR) 500 MG 24 hr tablet Take 500 mg by mouth daily with breakfast.    . prochlorperazine (COMPAZINE) 10 MG tablet TAKE ONE TABLET BY MOUTH EVERY 6 HOURS AS NEEDED FOR NAUSEA AND VOMITING 30 tablet 0  . ranitidine (ZANTAC) 150 MG tablet Take 150 mg by mouth daily.    Marland Kitchen senna-docusate (SENNA S) 8.6-50 MG per tablet Take 2 tablets by mouth daily.     No current facility-administered medications for this visit.    SURGICAL HISTORY:  Past Surgical History  Procedure Laterality Date  . Vein ligation and stripping  1977  . Chest tube insertion Right 01/15/2015    Procedure: INSERTION PLEURAL DRAINAGE CATHETER WITH ULTRASOUND AND FLURO GUIDANCE;  Surgeon: Grace Isaac, MD;  Location: [facility];  Service: Thoracic;  Laterality: Right;  . Removal of pleural drainage  catheter Right 03/26/2015    Procedure: REMOVAL OF PLEURAL DRAINAGE CATHETER;  Surgeon: Grace Isaac, MD;  Location: [facility];  Service: Thoracic;  Laterality: Right;    REVIEW OF SYSTEMS:  Constitutional: negative Eyes: negative Ears, nose, mouth, throat, and face: negative Respiratory: negative Cardiovascular: negative Gastrointestinal: negative Genitourinary:negative Integument/breast: negative Hematologic/lymphatic: negative Musculoskeletal:negative Neurological: negative Behavioral/Psych: negative Endocrine:  negative Allergic/Immunologic: negative   PHYSICAL EXAMINATION: General appearance: alert, cooperative and no distress Head: Normocephalic, without obvious abnormality, atraumatic Neck: no adenopathy, no JVD, supple, symmetrical, trachea midline and thyroid not enlarged, symmetric, no tenderness/mass/nodules Lymph nodes: Cervical, supraclavicular, and axillary nodes normal. Resp: clear to auscultation bilaterally Back: symmetric, no curvature. ROM normal. No CVA tenderness. Cardio: regular rate and rhythm, S1, S2 normal, no murmur, click, rub or gallop GI: soft, non-tender; bowel sounds normal; no masses,  no organomegaly Extremities: extremities normal, atraumatic, no cyanosis or edema Neurologic: Alert and oriented X 3, normal strength and tone. Normal symmetric reflexes. Normal coordination and gait  ECOG PERFORMANCE STATUS: 1 - Symptomatic but completely ambulatory  Blood pressure 159/84, pulse 102, temperature 97.7 F (36.5 C), temperature source Oral, resp. rate 18, height '5\' 6"'$  (1.676 m), weight 145 lb 11.2 oz (66.089 kg), SpO2 97 %.  LABORATORY DATA: Lab Results  Component Value Date   WBC 7.8 10/13/2015   HGB 14.6 10/13/2015   HCT 43.5 10/13/2015   MCV 98.4* 10/13/2015   PLT 240 10/13/2015      Chemistry      Component Value Date/Time   NA 142 10/13/2015 1144   NA 138 01/15/2015 0608   K 3.8 10/13/2015 1144   K 3.5 01/15/2015 0608   CL 97 01/15/2015 0608   CO2 30* 10/13/2015 1144   CO2 34* 01/15/2015 0608   BUN 13.8 10/13/2015 1144   BUN 13 01/15/2015 0608   CREATININE 0.9 10/13/2015 1144   CREATININE 0.96 01/15/2015 0608   GLU 231* 03/10/2015 1539      Component Value Date/Time   CALCIUM 10.4 10/13/2015 1144   CALCIUM 9.4 01/15/2015 0608   ALKPHOS 94 10/13/2015 1144   ALKPHOS 102 01/15/2015 0608   AST 16 10/13/2015 1144   AST 42* 01/15/2015 0608   ALT 15 10/13/2015 1144   ALT 38 01/15/2015 0608   BILITOT 0.31 10/13/2015 1144   BILITOT 0.8 01/15/2015  0608       RADIOGRAPHIC STUDIES: Ct Chest W Contrast  10/07/2015  ADDENDUM REPORT: 10/07/2015 13:33 ADDENDUM: There is a speech recognition error under the target lesions. Target lesion 1 currently measures 3.8 x 3.1 cm, as reported in the main finding section. Electronically Signed   By: Richardean Sale M.D.   On: 10/07/2015 13:33  10/07/2015  CLINICAL DATA:  Lung cancer restaging. Chemotherapy ongoing. Patient reports coughing, tinged with blood and constipation. Recist protocol. Subsequent encounter Prior EXAM: CT CHEST, ABDOMEN, AND PELVIS WITH CONTRAST TECHNIQUE: Multidetector CT imaging of the chest, abdomen and pelvis was performed following the standard protocol during bolus administration of intravenous contrast. CONTRAST:  14m OMNIPAQUE IOHEXOL 300 MG/ML  SOLN COMPARISON:  CTs 08/11/2015 and 05/24/2015. FINDINGS: RECIST 1.1 Target Lesions: 1. The right upper lobe lesion is suboptimally evaluated, now partially obscured by adjacent pneumonitis or atelectasis. It measures approximately 3.0 x 3.1 cm (4.0 x 4.7 cm previously). 2. The right iliac bone lesion appears larger, especially its lytic component. This lesion measures up to 3.9 cm on image 92. The progression is most obvious on the coronal images. Non-target Lesions: 1.  11 mm AP window node on image 25 does not appear significantly changed. CT CHEST FINDINGS Mediastinum/Nodes: Mildly prominent mediastinal lymph nodes have not significantly changed, including a 7 mm right paratracheal node on image 24 and an 11 mm AP window node on image 25. Mildly prominent right hilar lymph nodes are also stable, likely reactive. There is stable mild thyroid nodularity. Mid to distal esophageal wall thickening attributed to radiation. The heart size is normal. There is no pericardial effusion. There is diffuse atherosclerosis of the aorta, great vessels and coronary arteries. The SVC is patent. Lungs/Pleura: Loculated pleural fluid on the right appears  unchanged. There is no significant left pleural effusion. As above, the residual right upper lobe mass is partially obscured by adjacent postobstructive pneumonitis or atelectasis, but appears slightly smaller, measuring approximately 3.8 x 3.1 cm. There is stable right hilar distortion and peribronchial thickening on the right without discrete nodularity. Dependent atelectasis is present in the left lower lobe. No new or enlarging pulmonary nodules are identified. Minimal left lower lobe nodularity on image 44 is stable. There are diffuse emphysematous changes. Musculoskeletal/Chest wall: Pretracheal subcutaneous nodule with a central low density in the lower neck is unchanged, measuring 14 x 17 mm on image 8. No chest wall mass identified. Bilateral gynecomastia noted. The previously noted sclerotic lesion within the T11 vertebral body is less dense, but not significantly changed in size, measuring 10 mm. No new osseous lesions identified. CT ABDOMEN AND PELVIS FINDINGS Hepatobiliary: The liver appears stable with mild contour irregularity and ill-defined low-density anteriorly in the left lobe. No discrete focal lesions observed. There is mild diffuse gallbladder wall enhancement without focal abnormality. Mild extrahepatic biliary dilatation appears unchanged. Pancreas: Unremarkable. No pancreatic ductal dilatation or surrounding inflammatory changes. Spleen: Normal in size without focal abnormality. Adrenals/Urinary Tract: Both adrenal glands appear normal. Small right renal cysts are noted. The left kidney appears normal. There is no hydronephrosis or urinary tract calculus. The bladder appears unremarkable. Stomach/Bowel: No evidence of bowel wall thickening, distention or surrounding inflammatory change. The appendix appears normal. Vascular/Lymphatic: There are no enlarged abdominal or pelvic lymph nodes. There is diffuse atherosclerosis of the aorta, its branches and the iliac arteries. Reproductive:  Stable moderate enlargement of the prostate gland with central dystrophic calcifications. Other: No evidence of abdominal wall mass or hernia. Musculoskeletal: The lytic lesion involving the right iliac bone has enlarged, measuring up to 3.9 cm transverse and 3.5 cm cephalocaudad. No pathologic fracture identified. No other osseous lesions seen. IMPRESSION: 1. Mildly increased parenchymal opacity peripheral to the right upper lobe lesion, most consistent with postobstructive pneumonitis/atelectasis. The right upper lobe mass appears slightly smaller. 2. No disease progression identified within the chest. 3. Enlarging lytic lesion in the right iliac bone consistent with progressive metastatic disease. The T11 lesion is grossly stable in size. 4. No extra osseous metastases identified in the abdomen or pelvis. Electronically Signed: By: Richardean Sale M.D. On: 10/06/2015 09:38   Ct Abdomen Pelvis W Contrast  10/07/2015  ADDENDUM REPORT: 10/07/2015 13:33 ADDENDUM: There is a speech recognition error under the target lesions. Target lesion 1 currently measures 3.8 x 3.1 cm, as reported in the main finding section. Electronically Signed   By: Richardean Sale M.D.   On: 10/07/2015 13:33  10/07/2015  CLINICAL DATA:  Lung cancer restaging. Chemotherapy ongoing. Patient reports coughing, tinged with blood and constipation. Recist protocol. Subsequent encounter Prior EXAM: CT CHEST, ABDOMEN, AND PELVIS WITH CONTRAST TECHNIQUE: Multidetector CT imaging of the chest,  abdomen and pelvis was performed following the standard protocol during bolus administration of intravenous contrast. CONTRAST:  145m OMNIPAQUE IOHEXOL 300 MG/ML  SOLN COMPARISON:  CTs 08/11/2015 and 05/24/2015. FINDINGS: RECIST 1.1 Target Lesions: 1. The right upper lobe lesion is suboptimally evaluated, now partially obscured by adjacent pneumonitis or atelectasis. It measures approximately 3.0 x 3.1 cm (4.0 x 4.7 cm previously). 2. The right iliac bone  lesion appears larger, especially its lytic component. This lesion measures up to 3.9 cm on image 92. The progression is most obvious on the coronal images. Non-target Lesions: 1. 11 mm AP window node on image 25 does not appear significantly changed. CT CHEST FINDINGS Mediastinum/Nodes: Mildly prominent mediastinal lymph nodes have not significantly changed, including a 7 mm right paratracheal node on image 24 and an 11 mm AP window node on image 25. Mildly prominent right hilar lymph nodes are also stable, likely reactive. There is stable mild thyroid nodularity. Mid to distal esophageal wall thickening attributed to radiation. The heart size is normal. There is no pericardial effusion. There is diffuse atherosclerosis of the aorta, great vessels and coronary arteries. The SVC is patent. Lungs/Pleura: Loculated pleural fluid on the right appears unchanged. There is no significant left pleural effusion. As above, the residual right upper lobe mass is partially obscured by adjacent postobstructive pneumonitis or atelectasis, but appears slightly smaller, measuring approximately 3.8 x 3.1 cm. There is stable right hilar distortion and peribronchial thickening on the right without discrete nodularity. Dependent atelectasis is present in the left lower lobe. No new or enlarging pulmonary nodules are identified. Minimal left lower lobe nodularity on image 44 is stable. There are diffuse emphysematous changes. Musculoskeletal/Chest wall: Pretracheal subcutaneous nodule with a central low density in the lower neck is unchanged, measuring 14 x 17 mm on image 8. No chest wall mass identified. Bilateral gynecomastia noted. The previously noted sclerotic lesion within the T11 vertebral body is less dense, but not significantly changed in size, measuring 10 mm. No new osseous lesions identified. CT ABDOMEN AND PELVIS FINDINGS Hepatobiliary: The liver appears stable with mild contour irregularity and ill-defined low-density  anteriorly in the left lobe. No discrete focal lesions observed. There is mild diffuse gallbladder wall enhancement without focal abnormality. Mild extrahepatic biliary dilatation appears unchanged. Pancreas: Unremarkable. No pancreatic ductal dilatation or surrounding inflammatory changes. Spleen: Normal in size without focal abnormality. Adrenals/Urinary Tract: Both adrenal glands appear normal. Small right renal cysts are noted. The left kidney appears normal. There is no hydronephrosis or urinary tract calculus. The bladder appears unremarkable. Stomach/Bowel: No evidence of bowel wall thickening, distention or surrounding inflammatory change. The appendix appears normal. Vascular/Lymphatic: There are no enlarged abdominal or pelvic lymph nodes. There is diffuse atherosclerosis of the aorta, its branches and the iliac arteries. Reproductive: Stable moderate enlargement of the prostate gland with central dystrophic calcifications. Other: No evidence of abdominal wall mass or hernia. Musculoskeletal: The lytic lesion involving the right iliac bone has enlarged, measuring up to 3.9 cm transverse and 3.5 cm cephalocaudad. No pathologic fracture identified. No other osseous lesions seen. IMPRESSION: 1. Mildly increased parenchymal opacity peripheral to the right upper lobe lesion, most consistent with postobstructive pneumonitis/atelectasis. The right upper lobe mass appears slightly smaller. 2. No disease progression identified within the chest. 3. Enlarging lytic lesion in the right iliac bone consistent with progressive metastatic disease. The T11 lesion is grossly stable in size. 4. No extra osseous metastases identified in the abdomen or pelvis. Electronically Signed: By: WRichardean Sale  M.D. On: 10/06/2015 09:38    ASSESSMENT AND PLAN: This is a very pleasant 80 years old white male with history of stage IV non-small cell lung cancer, adenocarcinoma status post induction chemotherapy was carboplatin and  Alimta and currently undergoing treatment with immunotherapy according to the [employer] [place] 370 clinical trial. He was randomized to the Nivolumab arm 240 mg IV every 2 weeks. He is status post 9 cycles and tolerating his treatment fairly well. The recent CT scan of the chest, abdomen and pelvis showed no significant evidence for disease progression except for enlarging lytic lesion in the right iliac bone which could be disease progression versus healing process any bone lesion. The scan also showed decrease in the size of the right upper lobe mass. I discussed the scan results with the patient and his wife. I recommended for the patient to proceed with cycle #10 of his immunotherapy today as scheduled.  He would come back for follow-up visit in 2 weeks for reevaluation before starting cycle #11. The patient was advised to call immediately if he has any concerning symptoms in the interval. The patient voices understanding of current disease status and treatment options and is in agreement with the current care plan.  All questions were answered. The patient knows to call the clinic with any problems, questions or concerns. We can certainly see the patient much sooner if necessary.  Disclaimer: This note was dictated with voice recognition software. Similar sounding words can inadvertently be transcribed and may not be corrected upon review.

## 2015-10-13 NOTE — Telephone Encounter (Signed)
per Rubin Payor ok to sch pt appt on 11/23-MW moved inf to coordinate w/MD

## 2015-10-13 NOTE — Progress Notes (Signed)
10/13/15 - [facility] [id number] - questionnaires (PRO's) - Patient into cancer center for routine visit.  Patient given PRO's upon arrival to the cancer center.  Patient completed PRO's.   PRO's was checked for completeness.  Research labs were drawn today.  The patient was thanked for his continued support in this clinical trial. Omar Peters 10/13/15 - 12:30 pm

## 2015-10-13 NOTE — Patient Instructions (Signed)
[facility] Discharge Instructions for Patients Receiving Chemotherapy  Today you received the following chemotherapy agents:  Nivolumab.  To help prevent nausea and vomiting after your treatment, we encourage you to take your nausea medication as directed.   If you develop nausea and vomiting that is not controlled by your nausea medication, call the clinic.   BELOW ARE SYMPTOMS THAT SHOULD BE REPORTED IMMEDIATELY:  *FEVER GREATER THAN 100.5 F  *CHILLS WITH OR WITHOUT FEVER  NAUSEA AND VOMITING THAT IS NOT CONTROLLED WITH YOUR NAUSEA MEDICATION  *UNUSUAL SHORTNESS OF BREATH  *UNUSUAL BRUISING OR BLEEDING  TENDERNESS IN MOUTH AND THROAT WITH OR WITHOUT PRESENCE OF ULCERS  *URINARY PROBLEMS  *BOWEL PROBLEMS  UNUSUAL RASH Items with * indicate a potential emergency and should be followed up as soon as possible.  Feel free to call the clinic you have any questions or concerns. The clinic phone number is [phone].  Please show the CHEMO ALERT CARD at check-in to the Emergency Department and triage nurse.   

## 2015-10-13 NOTE — Progress Notes (Signed)
Oncology Nurse Navigator Documentation  Oncology Nurse Navigator Flowsheets 10/13/2015  Navigator Encounter Type Clinic/MDC/spoke with patient and wife today at [facility].  He had a recent scan and received news today that cancer is getting smaller.  I was happy to hear the news.  We talked about him going on vacation to fish next week.  Great news for Mr. Omar Peters.  I asked him to call if needed anything.   Patient Visit Type Medonc;Follow-up  Treatment Phase Treatment  Barriers/Navigation Needs No barriers at this time  Time Spent with Patient 30

## 2015-10-21 ENCOUNTER — Other Ambulatory Visit: Payer: Self-pay | Admitting: Medical Oncology

## 2015-10-21 DIAGNOSIS — C3411 Malignant neoplasm of upper lobe, right bronchus or lung: Secondary | ICD-10-CM

## 2015-10-21 DIAGNOSIS — Z79899 Other long term (current) drug therapy: Secondary | ICD-10-CM

## 2015-10-25 ENCOUNTER — Ambulatory Visit (HOSPITAL_BASED_OUTPATIENT_CLINIC_OR_DEPARTMENT_OTHER): Payer: Medicare Other

## 2015-10-25 ENCOUNTER — Ambulatory Visit (HOSPITAL_BASED_OUTPATIENT_CLINIC_OR_DEPARTMENT_OTHER): Payer: Medicare Other | Admitting: Physician Assistant

## 2015-10-25 ENCOUNTER — Other Ambulatory Visit (HOSPITAL_COMMUNITY)
Admission: RE | Admit: 2015-10-25 | Discharge: 2015-10-25 | Disposition: A | Discharge status: Home / Self Care | Payer: Medicare Other | Source: Ambulatory Visit | Attending: Internal Medicine | Admitting: Internal Medicine

## 2015-10-25 ENCOUNTER — Encounter: Payer: Self-pay | Admitting: Medical Oncology

## 2015-10-25 ENCOUNTER — Encounter: Payer: Self-pay | Admitting: Physician Assistant

## 2015-10-25 ENCOUNTER — Other Ambulatory Visit (HOSPITAL_BASED_OUTPATIENT_CLINIC_OR_DEPARTMENT_OTHER): Payer: Medicare Other

## 2015-10-25 ENCOUNTER — Telehealth: Payer: Self-pay | Admitting: Internal Medicine

## 2015-10-25 VITALS — BP 163/86 | HR 103 | Temp 97.7°F | Resp 18 | Ht 66.0 in | Wt 146.2 lb

## 2015-10-25 DIAGNOSIS — Z006 Encounter for examination for normal comparison and control in clinical research program: Secondary | ICD-10-CM

## 2015-10-25 DIAGNOSIS — C3411 Malignant neoplasm of upper lobe, right bronchus or lung: Secondary | ICD-10-CM

## 2015-10-25 DIAGNOSIS — Z5112 Encounter for antineoplastic immunotherapy: Secondary | ICD-10-CM | POA: Diagnosis not present

## 2015-10-25 DIAGNOSIS — Z79891 Long term (current) use of opiate analgesic: Secondary | ICD-10-CM | POA: Diagnosis present

## 2015-10-25 DIAGNOSIS — C7951 Secondary malignant neoplasm of bone: Secondary | ICD-10-CM | POA: Diagnosis not present

## 2015-10-25 DIAGNOSIS — Z79899 Other long term (current) drug therapy: Secondary | ICD-10-CM

## 2015-10-25 LAB — COMPREHENSIVE METABOLIC PANEL (CC13)
ALT: 17 U/L (ref 0–55)
ANION GAP: 9 meq/L (ref 3–11)
AST: 19 U/L (ref 5–34)
Albumin: 3.1 g/dL — ABNORMAL LOW (ref 3.5–5.0)
Alkaline Phosphatase: 93 U/L (ref 40–150)
BUN: 9.5 mg/dL (ref 7.0–26.0)
CALCIUM: 10.1 mg/dL (ref 8.4–10.4)
CHLORIDE: 105 meq/L (ref 98–109)
CO2: 26 meq/L (ref 22–29)
CREATININE: 0.9 mg/dL (ref 0.7–1.3)
EGFR: 82 mL/min/{1.73_m2} — AB (ref >90)
Glucose: 146 mg/dl — ABNORMAL HIGH (ref 70–140)
POTASSIUM: 3.7 meq/L (ref 3.5–5.1)
Sodium: 140 mEq/L (ref 136–145)
Total Bilirubin: 0.34 mg/dL (ref 0.20–1.20)
Total Protein: 7.3 g/dL (ref 6.4–8.3)

## 2015-10-25 LAB — CBC WITH DIFFERENTIAL/PLATELET
BASO%: 0.1 % (ref 0.0–2.0)
BASOS ABS: 0 10*3/uL (ref 0.0–0.1)
EOS%: 3.3 % (ref 0.0–7.0)
Eosinophils Absolute: 0.2 10*3/uL (ref 0.0–0.5)
HCT: 45.6 % (ref 38.4–49.9)
HEMOGLOBIN: 15.2 g/dL (ref 13.0–17.1)
LYMPH%: 23.5 % (ref 14.0–49.0)
MCH: 32.7 pg (ref 27.2–33.4)
MCHC: 33.4 g/dL (ref 32.0–36.0)
MCV: 98 fL (ref 79.3–98.0)
MONO#: 0.8 10*3/uL (ref 0.1–0.9)
MONO%: 10.7 % (ref 0.0–14.0)
NEUT#: 4.5 10*3/uL (ref 1.5–6.5)
NEUT%: 62.4 % (ref 39.0–75.0)
Platelets: 211 10*3/uL (ref 140–400)
RBC: 4.65 10*6/uL (ref 4.20–5.82)
RDW: 13.9 % (ref 11.0–14.6)
WBC: 7.3 10*3/uL (ref 4.0–10.3)
lymph#: 1.7 10*3/uL (ref 0.9–3.3)

## 2015-10-25 LAB — LACTATE DEHYDROGENASE (CC13): LDH: 130 U/L (ref 125–245)

## 2015-10-25 LAB — TSH CHCC: TSH: 5.165 m[IU]/L — AB (ref 0.320–4.118)

## 2015-10-25 LAB — PHOSPHORUS: PHOSPHORUS: 3.7 mg/dL (ref 2.5–4.6)

## 2015-10-25 LAB — T4, FREE: FREE T4: 0.89 ng/dL (ref 0.80–1.80)

## 2015-10-25 LAB — T3, FREE: T3 FREE: 2.6 pg/mL (ref 2.3–4.2)

## 2015-10-25 LAB — MAGNESIUM (CC13): MAGNESIUM: 2.3 mg/dL (ref 1.5–2.5)

## 2015-10-25 MED ORDER — SODIUM CHLORIDE 0.9 % IV SOLN
Freq: Once | INTRAVENOUS | Status: AC
  Administered 2015-10-25: 10:00:00 via INTRAVENOUS

## 2015-10-25 MED ORDER — SODIUM CHLORIDE 0.9 % IV SOLN
240.0000 mg | Freq: Once | INTRAVENOUS | Status: AC
  Administered 2015-10-25: 240 mg via INTRAVENOUS
  Filled 2015-10-25: qty 24

## 2015-10-25 NOTE — Patient Instructions (Signed)
Nivolumab injection What is this medicine? NIVOLUMAB (nye VOL ue mab) is a monoclonal antibody. It is used to treat melanoma, lung cancer, kidney cancer, and Hodgkin lymphoma. This medicine may be used for other purposes; ask your health care provider or pharmacist if you have questions. What should I tell my health care provider before I take this medicine? They need to know if you have any of these conditions: -diabetes -immune system problems -kidney disease -liver disease -lung disease -organ transplant -stomach or intestine problems -thyroid disease -an unusual or allergic reaction to nivolumab, other medicines, foods, dyes, or preservatives -pregnant or trying to get pregnant -breast-feeding How should I use this medicine? This medicine is for infusion into a vein. It is given by a health care professional in a hospital or clinic setting. A special MedGuide will be given to you before each treatment. Be sure to read this information carefully each time. Talk to your pediatrician regarding the use of this medicine in children. Special care may be needed. Overdosage: If you think you have taken too much of this medicine contact a poison control center or emergency room at once. NOTE: This medicine is only for you. Do not share this medicine with others. What if I miss a dose? It is important not to miss your dose. Call your doctor or health care professional if you are unable to keep an appointment. What may interact with this medicine? Interactions have not been studied. Give your health care provider a list of all the medicines, herbs, non-prescription drugs, or dietary supplements you use. Also tell them if you smoke, drink alcohol, or use illegal drugs. Some items may interact with your medicine. This list may not describe all possible interactions. Give your health care provider a list of all the medicines, herbs, non-prescription drugs, or dietary supplements you use. Also tell  them if you smoke, drink alcohol, or use illegal drugs. Some items may interact with your medicine. What should I watch for while using this medicine? This drug may make you feel generally unwell. Continue your course of treatment even though you feel ill unless your doctor tells you to stop. You may need blood work done while you are taking this medicine. Do not become pregnant while taking this medicine or for 5 months after stopping it. Women should inform their doctor if they wish to become pregnant or think they might be pregnant. There is a potential for serious side effects to an unborn child. Talk to your health care professional or pharmacist for more information. Do not breast-feed an infant while taking this medicine. What side effects may I notice from receiving this medicine? Side effects that you should report to your doctor or health care professional as soon as possible: -allergic reactions like skin rash, itching or hives, swelling of the face, lips, or tongue -black, tarry stools -blood in the urine -bloody or watery diarrhea -changes in vision -change in sex drive -changes in emotions or moods -chest pain -confusion -cough -decreased appetite -diarrhea -facial flushing -feeling faint or lightheaded -fever, chills -hair loss -hallucination, loss of contact with reality -headache -irritable -joint pain -loss of memory -muscle pain -muscle weakness -seizures -shortness of breath -signs and symptoms of high blood sugar such as dizziness; dry mouth; dry skin; fruity breath; nausea; stomach pain; increased hunger or thirst; increased urination -signs and symptoms of kidney injury like trouble passing urine or change in the amount of urine -signs and symptoms of liver injury like dark yellow or  brown urine; general ill feeling or flu-like symptoms; light-colored stools; loss of appetite; nausea; right upper belly pain; unusually weak or tired; yellowing of the eyes or  skin -stiff neck -swelling of the ankles, feet, hands -weight gain Side effects that usually do not require medical attention (report to your doctor or health care professional if they continue or are bothersome): -bone pain -constipation -tiredness -vomiting This list may not describe all possible side effects. Call your doctor for medical advice about side effects. You may report side effects to FDA at 1-800-FDA-1088. Where should I keep my medicine? This drug is given in a hospital or clinic and will not be stored at home. NOTE: This sheet is a summary. It may not cover all possible information. If you have questions about this medicine, talk to your doctor, pharmacist, or health care provider.    2016, Elsevier/Gold Standard. (2015-05-05 10:03:42)

## 2015-10-25 NOTE — Progress Notes (Signed)
[place] [phone]   Fax:([phone]) [place], MD [address] [place]  [id number]  DIAGNOSIS: Stage IV (T3, N3, M1b) non-small cell lung cancer, adenocarcinoma diagnosed in January 2016 presented with right upper lobe lung mass in addition to mediastinal lymphadenopathy and large right pleural effusion as well as bone metastasis.  PRIOR THERAPY:  1. Status post Pleurx catheter placement by Dr. Servando Snare for drainage of the right pleural effusion.  2. Systemic chemotherapy with carboplatin for AUC of 5 and Alimta 500 MG/M2 every 3 weeks. Status post 6 cycles.  CURRENT THERAPY: Patient is participating in the [place] 370 clinical trial and has been randomized to group A arm B-3 to receive Nivolumab 240 mg given every 2 weeks. First cycle expected 06/10/2015. Status post 10 cycles.  INTERVAL HISTORY: Omar Peters 80 y.o. male returns to the clinic today for follow-up visit accompanied by his wife. The patient is feeling fine today with no specific complaints. He is tolerating his treatment with Nivolumab fairly well with no significant adverse effects. The patient denied having any significant skin rash, diarrhea. He has no nausea or vomiting. No significant headache or visual changes. He has no chest pain, shortness of breath, cough or hemoptysis. He has no significant weight loss or night sweats. He is eager to proceed with cycle #11  MEDICAL HISTORY: Past Medical History  Diagnosis Date  . Hypertension   . GERD (gastroesophageal reflux disease)   . Shortness of breath dyspnea     01/14/15 at night and has to sit up  . Anxiety     due to diagnosis of Stage 4 Lung Cancer  . Macular degeneration of right eye   . Cancer ([facility])     Lung  . Diabetes mellitus without complication (HCC)     ALLERGIES:  is allergic to amitriptyline and marinol.  MEDICATIONS:  Current Outpatient Prescriptions  Medication Sig  Dispense Refill  . amitriptyline (ELAVIL) 10 MG tablet     . aspirin 81 MG tablet Take 81 mg by mouth daily.    . B Complex-Biotin-FA (B-COMPLEX PO) Take 1 each by mouth daily.    Marland Kitchen HYDROcodone-acetaminophen (NORCO) 5-325 MG per tablet Take 1 tablet by mouth every 6 (six) hours as needed for moderate pain. 30 tablet 0  . magnesium hydroxide (MILK OF MAGNESIA) 400 MG/5ML suspension Take by mouth daily as needed for mild constipation.    . metFORMIN (GLUCOPHAGE) 500 MG tablet     . metFORMIN (GLUCOPHAGE-XR) 500 MG 24 hr tablet Take 500 mg by mouth daily with breakfast.    . prochlorperazine (COMPAZINE) 10 MG tablet TAKE ONE TABLET BY MOUTH EVERY 6 HOURS AS NEEDED FOR NAUSEA AND VOMITING 30 tablet 0  . ranitidine (ZANTAC) 150 MG tablet Take 150 mg by mouth daily.    Marland Kitchen senna-docusate (SENNA S) 8.6-50 MG per tablet Take 2 tablets by mouth daily.     No current facility-administered medications for this visit.    SURGICAL HISTORY:  Past Surgical History  Procedure Laterality Date  . Vein ligation and stripping  1977  . Chest tube insertion Right 01/15/2015    Procedure: INSERTION PLEURAL DRAINAGE CATHETER WITH ULTRASOUND AND FLURO GUIDANCE;  Surgeon: Grace Isaac, MD;  Location: [facility];  Service: Thoracic;  Laterality: Right;  . Removal of pleural drainage catheter Right 03/26/2015    Procedure: REMOVAL OF PLEURAL DRAINAGE CATHETER;  Surgeon: Grace Isaac,  MD;  Location: [facility];  Service: Thoracic;  Laterality: Right;    REVIEW OF SYSTEMS:  Constitutional: negative Eyes: negative Ears, nose, mouth, throat, and face: negative Respiratory: negative Cardiovascular: negative Gastrointestinal: negative Genitourinary:negative Integument/breast: negative Hematologic/lymphatic: negative Musculoskeletal:negative Neurological: negative Behavioral/Psych: negative Endocrine: negative Allergic/Immunologic: negative   PHYSICAL EXAMINATION: General appearance: alert, cooperative and no  distress Head: Normocephalic, without obvious abnormality, atraumatic Neck: no adenopathy, no JVD, supple, symmetrical, trachea midline and thyroid not enlarged, symmetric, no tenderness/mass/nodules Lymph nodes: Cervical, supraclavicular, and axillary nodes normal. Resp: clear to auscultation bilaterally Back: symmetric, no curvature. ROM normal. No CVA tenderness. Cardio: regular rate and rhythm, S1, S2 normal, no murmur, click, rub or gallop GI: soft, non-tender; bowel sounds normal; no masses,  no organomegaly Extremities: extremities normal, atraumatic, no cyanosis or edema Neurologic: Alert and oriented X 3, normal strength and tone. Normal symmetric reflexes. Normal coordination and gait  ECOG PERFORMANCE STATUS: 1 - Symptomatic but completely ambulatory  There were no vitals taken for this visit.  LABORATORY DATA: CBC Latest Ref Rng 10/13/2015 09/29/2015 09/15/2015  WBC 4.0 - 10.3 10e3/uL 7.8 6.6 7.4  Hemoglobin 13.0 - 17.1 g/dL 14.6 14.4 14.2  Hematocrit 38.4 - 49.9 % 43.5 42.9 42.8  Platelets 140 - 400 10e3/uL 240 218 221   CMP Latest Ref Rng 10/13/2015 09/29/2015 09/15/2015  Glucose 70 - 140 mg/dl 160(H) 128 185(H)  BUN 7.0 - 26.0 mg/dL 13.8 12.3 10.5  Creatinine 0.7 - 1.3 mg/dL 0.9 0.8 0.8  Sodium 136 - 145 mEq/L 142 141 139  Potassium 3.5 - 5.1 mEq/L 3.8 3.8 3.8  Chloride 96 - 112 mmol/L - - -  CO2 22 - 29 mEq/L 30(H) 28 28  Calcium 8.4 - 10.4 mg/dL 10.4 9.7 10.1  Total Protein 6.4 - 8.3 g/dL 7.4 7.4 7.6  Total Bilirubin 0.20 - 1.20 mg/dL 0.31 0.31 0.34  Alkaline Phos 40 - 150 U/L 94 93 93  AST 5 - 34 U/L '16 16 17  '$ ALT 0 - 55 U/L '15 13 14     '$ RADIOGRAPHIC STUDIES: Ct Chest W Contrast  10/07/2015  ADDENDUM REPORT: 10/07/2015 13:33 ADDENDUM: There is a speech recognition error under the target lesions. Target lesion 1 currently measures 3.8 x 3.1 cm, as reported in the main finding section. Electronically Signed   By: Richardean Sale M.D.   On: 10/07/2015  13:33  10/07/2015  CLINICAL DATA:  Lung cancer restaging. Chemotherapy ongoing. Patient reports coughing, tinged with blood and constipation. Recist protocol. Subsequent encounter Prior EXAM: CT CHEST, ABDOMEN, AND PELVIS WITH CONTRAST TECHNIQUE: Multidetector CT imaging of the chest, abdomen and pelvis was performed following the standard protocol during bolus administration of intravenous contrast. CONTRAST:  134m OMNIPAQUE IOHEXOL 300 MG/ML  SOLN COMPARISON:  CTs 08/11/2015 and 05/24/2015. FINDINGS: RECIST 1.1 Target Lesions: 1. The right upper lobe lesion is suboptimally evaluated, now partially obscured by adjacent pneumonitis or atelectasis. It measures approximately 3.0 x 3.1 cm (4.0 x 4.7 cm previously). 2. The right iliac bone lesion appears larger, especially its lytic component. This lesion measures up to 3.9 cm on image 92. The progression is most obvious on the coronal images. Non-target Lesions: 1. 11 mm AP window node on image 25 does not appear significantly changed. CT CHEST FINDINGS Mediastinum/Nodes: Mildly prominent mediastinal lymph nodes have not significantly changed, including a 7 mm right paratracheal node on image 24 and an 11 mm AP window node on image 25. Mildly prominent right hilar lymph nodes are also stable,  likely reactive. There is stable mild thyroid nodularity. Mid to distal esophageal wall thickening attributed to radiation. The heart size is normal. There is no pericardial effusion. There is diffuse atherosclerosis of the aorta, great vessels and coronary arteries. The SVC is patent. Lungs/Pleura: Loculated pleural fluid on the right appears unchanged. There is no significant left pleural effusion. As above, the residual right upper lobe mass is partially obscured by adjacent postobstructive pneumonitis or atelectasis, but appears slightly smaller, measuring approximately 3.8 x 3.1 cm. There is stable right hilar distortion and peribronchial thickening on the right without  discrete nodularity. Dependent atelectasis is present in the left lower lobe. No new or enlarging pulmonary nodules are identified. Minimal left lower lobe nodularity on image 44 is stable. There are diffuse emphysematous changes. Musculoskeletal/Chest wall: Pretracheal subcutaneous nodule with a central low density in the lower neck is unchanged, measuring 14 x 17 mm on image 8. No chest wall mass identified. Bilateral gynecomastia noted. The previously noted sclerotic lesion within the T11 vertebral body is less dense, but not significantly changed in size, measuring 10 mm. No new osseous lesions identified. CT ABDOMEN AND PELVIS FINDINGS Hepatobiliary: The liver appears stable with mild contour irregularity and ill-defined low-density anteriorly in the left lobe. No discrete focal lesions observed. There is mild diffuse gallbladder wall enhancement without focal abnormality. Mild extrahepatic biliary dilatation appears unchanged. Pancreas: Unremarkable. No pancreatic ductal dilatation or surrounding inflammatory changes. Spleen: Normal in size without focal abnormality. Adrenals/Urinary Tract: Both adrenal glands appear normal. Small right renal cysts are noted. The left kidney appears normal. There is no hydronephrosis or urinary tract calculus. The bladder appears unremarkable. Stomach/Bowel: No evidence of bowel wall thickening, distention or surrounding inflammatory change. The appendix appears normal. Vascular/Lymphatic: There are no enlarged abdominal or pelvic lymph nodes. There is diffuse atherosclerosis of the aorta, its branches and the iliac arteries. Reproductive: Stable moderate enlargement of the prostate gland with central dystrophic calcifications. Other: No evidence of abdominal wall mass or hernia. Musculoskeletal: The lytic lesion involving the right iliac bone has enlarged, measuring up to 3.9 cm transverse and 3.5 cm cephalocaudad. No pathologic fracture identified. No other osseous lesions  seen. IMPRESSION: 1. Mildly increased parenchymal opacity peripheral to the right upper lobe lesion, most consistent with postobstructive pneumonitis/atelectasis. The right upper lobe mass appears slightly smaller. 2. No disease progression identified within the chest. 3. Enlarging lytic lesion in the right iliac bone consistent with progressive metastatic disease. The T11 lesion is grossly stable in size. 4. No extra osseous metastases identified in the abdomen or pelvis. Electronically Signed: By: Richardean Sale M.D. On: 10/06/2015 09:38   Ct Abdomen Pelvis W Contrast  10/07/2015  ADDENDUM REPORT: 10/07/2015 13:33 ADDENDUM: There is a speech recognition error under the target lesions. Target lesion 1 currently measures 3.8 x 3.1 cm, as reported in the main finding section. Electronically Signed   By: Richardean Sale M.D.   On: 10/07/2015 13:33  10/07/2015  CLINICAL DATA:  Lung cancer restaging. Chemotherapy ongoing. Patient reports coughing, tinged with blood and constipation. Recist protocol. Subsequent encounter Prior EXAM: CT CHEST, ABDOMEN, AND PELVIS WITH CONTRAST TECHNIQUE: Multidetector CT imaging of the chest, abdomen and pelvis was performed following the standard protocol during bolus administration of intravenous contrast. CONTRAST:  169m OMNIPAQUE IOHEXOL 300 MG/ML  SOLN COMPARISON:  CTs 08/11/2015 and 05/24/2015. FINDINGS: RECIST 1.1 Target Lesions: 1. The right upper lobe lesion is suboptimally evaluated, now partially obscured by adjacent pneumonitis or atelectasis. It measures approximately  3.0 x 3.1 cm (4.0 x 4.7 cm previously). 2. The right iliac bone lesion appears larger, especially its lytic component. This lesion measures up to 3.9 cm on image 92. The progression is most obvious on the coronal images. Non-target Lesions: 1. 11 mm AP window node on image 25 does not appear significantly changed. CT CHEST FINDINGS Mediastinum/Nodes: Mildly prominent mediastinal lymph nodes have not  significantly changed, including a 7 mm right paratracheal node on image 24 and an 11 mm AP window node on image 25. Mildly prominent right hilar lymph nodes are also stable, likely reactive. There is stable mild thyroid nodularity. Mid to distal esophageal wall thickening attributed to radiation. The heart size is normal. There is no pericardial effusion. There is diffuse atherosclerosis of the aorta, great vessels and coronary arteries. The SVC is patent. Lungs/Pleura: Loculated pleural fluid on the right appears unchanged. There is no significant left pleural effusion. As above, the residual right upper lobe mass is partially obscured by adjacent postobstructive pneumonitis or atelectasis, but appears slightly smaller, measuring approximately 3.8 x 3.1 cm. There is stable right hilar distortion and peribronchial thickening on the right without discrete nodularity. Dependent atelectasis is present in the left lower lobe. No new or enlarging pulmonary nodules are identified. Minimal left lower lobe nodularity on image 44 is stable. There are diffuse emphysematous changes. Musculoskeletal/Chest wall: Pretracheal subcutaneous nodule with a central low density in the lower neck is unchanged, measuring 14 x 17 mm on image 8. No chest wall mass identified. Bilateral gynecomastia noted. The previously noted sclerotic lesion within the T11 vertebral body is less dense, but not significantly changed in size, measuring 10 mm. No new osseous lesions identified. CT ABDOMEN AND PELVIS FINDINGS Hepatobiliary: The liver appears stable with mild contour irregularity and ill-defined low-density anteriorly in the left lobe. No discrete focal lesions observed. There is mild diffuse gallbladder wall enhancement without focal abnormality. Mild extrahepatic biliary dilatation appears unchanged. Pancreas: Unremarkable. No pancreatic ductal dilatation or surrounding inflammatory changes. Spleen: Normal in size without focal abnormality.  Adrenals/Urinary Tract: Both adrenal glands appear normal. Small right renal cysts are noted. The left kidney appears normal. There is no hydronephrosis or urinary tract calculus. The bladder appears unremarkable. Stomach/Bowel: No evidence of bowel wall thickening, distention or surrounding inflammatory change. The appendix appears normal. Vascular/Lymphatic: There are no enlarged abdominal or pelvic lymph nodes. There is diffuse atherosclerosis of the aorta, its branches and the iliac arteries. Reproductive: Stable moderate enlargement of the prostate gland with central dystrophic calcifications. Other: No evidence of abdominal wall mass or hernia. Musculoskeletal: The lytic lesion involving the right iliac bone has enlarged, measuring up to 3.9 cm transverse and 3.5 cm cephalocaudad. No pathologic fracture identified. No other osseous lesions seen. IMPRESSION: 1. Mildly increased parenchymal opacity peripheral to the right upper lobe lesion, most consistent with postobstructive pneumonitis/atelectasis. The right upper lobe mass appears slightly smaller. 2. No disease progression identified within the chest. 3. Enlarging lytic lesion in the right iliac bone consistent with progressive metastatic disease. The T11 lesion is grossly stable in size. 4. No extra osseous metastases identified in the abdomen or pelvis. Electronically Signed: By: Richardean Sale M.D. On: 10/06/2015 09:38    ASSESSMENT AND PLAN: This is a very pleasant 80 years old white male with history of stage IV non-small cell lung cancer, adenocarcinoma status post induction chemotherapy with carboplatin and Alimta and currently undergoing treatment with immunotherapy according to the [employer] 370 clinical trial. He was randomized  to the Nivolumab arm 240 mg IV every 2 weeks. He is status post 10 cycles and tolerating his treatment fairly well. The most recent CT scan of the chest, abdomen and pelvis showed no significant evidence for  disease progression except for enlarging lytic lesion in the right iliac bone which could be disease progression versus healing process any bone lesion. The scan also showed decrease in the size of the right upper lobe mass. Discussed the scan results with the patient and his wife. Recommended for the patient to proceed with cycle #11 of his immunotherapy today as scheduled.  He would come back for follow-up visit in 2 weeks for reevaluation before starting cycle #12. After Cycle 13, will proceed with follow up staging CTs to determine response to therapy The patient was advised to call immediately if he has any concerning symptoms in the interval. The patient voices understanding of current disease status and treatment options and is in agreement with the current care plan.  All questions were answered. The patient knows to call the clinic with any problems, questions or concerns. We can certainly see the patient much sooner if necessary.  ADDENDUM: Hematology/Oncology Attending: I had a face to face encounter with the patient today. I recommended his care plan. This is a very pleasant 80 years old white male with a stage IV non-small cell lung cancer, adenocarcinoma who is currently undergoing maintenance treatment with immunotherapy with Nivolumab 240 mg IV every 2 weeks on the [employer] 370 clinical trial, status post 10 cycles and has been tolerating his treatment fairly well. I recommended for the patient to proceed with cycle #11 today as a scheduled. He would come back for follow-up visit in 2 weeks for reevaluation before starting the next cycle of his treatment. The patient was advised to call immediately if he has any concerning symptoms in the interval.  Disclaimer: This note was dictated with voice recognition software. Similar sounding words can inadvertently be transcribed and may be missed upon review.  Eilleen Kempf., MD 10/25/2015

## 2015-10-25 NOTE — Telephone Encounter (Signed)
Gave and printed new sched Rubin Payor brought pt up ok to moved dec appt from 12.19 to 12.21

## 2015-10-25 NOTE — Progress Notes (Signed)
[id number]: Group A, Arm B3: Cycle 11/week 20 Patient here for Cycle 11 today. I met with both patient and spouse. Resting vitals obtained, patient maintaining weight. Patient denies any new symptoms or concerns. Patient continues to report not much improvement in fatigue but does confirm that it isn't any worse. Patient denies nausea & vomiting, denies pain, denies SOB or cough, denies skin rash, denies any new bowel or bladder concerns, denies neurological issues or headaches. Patient confirms no new medications or changes to his current medications. Patient was informed that his TSH levels are just above normal and that we will continue to monitor his thyroid and patient was encouraged to report any new symptoms should he experience any. Per Dr. Worthy Flank assessment of patient and patient's  labs and NCS labs, patient cleared to continue with Cycle 11 of Nivolumab today. Patient is leaving for a two week fishing trip after treatment, I confirmed with patient that he does have his study provided Information and Alert Card with him and will be keeping it with him at all times, patient and spouse were also encouraged to call Dr. Julien Nordmann, clinic or myself should they have any questions/concerns. Patient and spouse gave verbal understanding and deny any questions at this time.  Study generated vial assignment provided to pharmacist, Kennith Center.  Infusion treatment sign provided to [name], RN in treatment room, all questions answered.  Patient scheduled to return 11/23 for Cycle 12. Adele Dan, RN, BSN Clinical Research 10/25/2015 10:15 AM

## 2015-11-08 ENCOUNTER — Ambulatory Visit: Payer: Medicare Other | Admitting: Physician Assistant

## 2015-11-08 ENCOUNTER — Ambulatory Visit: Payer: Medicare Other

## 2015-11-08 ENCOUNTER — Other Ambulatory Visit: Payer: Medicare Other

## 2015-11-09 ENCOUNTER — Other Ambulatory Visit: Payer: Self-pay | Admitting: Medical Oncology

## 2015-11-09 DIAGNOSIS — C3411 Malignant neoplasm of upper lobe, right bronchus or lung: Secondary | ICD-10-CM

## 2015-11-09 DIAGNOSIS — Z79899 Other long term (current) drug therapy: Secondary | ICD-10-CM

## 2015-11-10 ENCOUNTER — Other Ambulatory Visit (HOSPITAL_BASED_OUTPATIENT_CLINIC_OR_DEPARTMENT_OTHER): Payer: Medicare Other

## 2015-11-10 ENCOUNTER — Ambulatory Visit (HOSPITAL_BASED_OUTPATIENT_CLINIC_OR_DEPARTMENT_OTHER): Payer: Medicare Other

## 2015-11-10 ENCOUNTER — Other Ambulatory Visit (HOSPITAL_COMMUNITY)
Admission: RE | Admit: 2015-11-10 | Discharge: 2015-11-10 | Disposition: A | Discharge status: Home / Self Care | Payer: Medicare Other | Source: Ambulatory Visit | Attending: Internal Medicine | Admitting: Internal Medicine

## 2015-11-10 ENCOUNTER — Telehealth: Payer: Self-pay | Admitting: Physician Assistant

## 2015-11-10 ENCOUNTER — Ambulatory Visit (HOSPITAL_BASED_OUTPATIENT_CLINIC_OR_DEPARTMENT_OTHER): Payer: Medicare Other | Admitting: Physician Assistant

## 2015-11-10 ENCOUNTER — Encounter: Payer: Self-pay | Admitting: Medical Oncology

## 2015-11-10 VITALS — BP 177/82 | HR 90 | Temp 97.6°F | Resp 18 | Ht 66.0 in | Wt 145.7 lb

## 2015-11-10 DIAGNOSIS — C3411 Malignant neoplasm of upper lobe, right bronchus or lung: Secondary | ICD-10-CM

## 2015-11-10 DIAGNOSIS — J984 Other disorders of lung: Secondary | ICD-10-CM

## 2015-11-10 DIAGNOSIS — Z006 Encounter for examination for normal comparison and control in clinical research program: Secondary | ICD-10-CM

## 2015-11-10 DIAGNOSIS — C7951 Secondary malignant neoplasm of bone: Secondary | ICD-10-CM

## 2015-11-10 DIAGNOSIS — Z79899 Other long term (current) drug therapy: Secondary | ICD-10-CM

## 2015-11-10 DIAGNOSIS — Z5112 Encounter for antineoplastic immunotherapy: Secondary | ICD-10-CM

## 2015-11-10 LAB — COMPREHENSIVE METABOLIC PANEL (CC13)
ALBUMIN: 3.4 g/dL — AB (ref 3.5–5.0)
ALK PHOS: 100 U/L (ref 40–150)
ALT: 16 U/L (ref 0–55)
ANION GAP: 10 meq/L (ref 3–11)
AST: 19 U/L (ref 5–34)
BILIRUBIN TOTAL: 0.37 mg/dL (ref 0.20–1.20)
BUN: 10.4 mg/dL (ref 7.0–26.0)
CALCIUM: 10.2 mg/dL (ref 8.4–10.4)
CO2: 28 meq/L (ref 22–29)
CREATININE: 0.9 mg/dL (ref 0.7–1.3)
Chloride: 104 mEq/L (ref 98–109)
EGFR: 82 mL/min/{1.73_m2} — AB (ref >90)
Glucose: 129 mg/dl (ref 70–140)
Potassium: 3.8 mEq/L (ref 3.5–5.1)
Sodium: 142 mEq/L (ref 136–145)
TOTAL PROTEIN: 7.8 g/dL (ref 6.4–8.3)

## 2015-11-10 LAB — CBC WITH DIFFERENTIAL/PLATELET
BASO%: 1 % (ref 0.0–2.0)
BASOS ABS: 0.1 10*3/uL (ref 0.0–0.1)
EOS ABS: 0.4 10*3/uL (ref 0.0–0.5)
EOS%: 5.5 % (ref 0.0–7.0)
HCT: 46 % (ref 38.4–49.9)
HEMOGLOBIN: 15.4 g/dL (ref 13.0–17.1)
LYMPH%: 30.1 % (ref 14.0–49.0)
MCH: 33.3 pg (ref 27.2–33.4)
MCHC: 33.5 g/dL (ref 32.0–36.0)
MCV: 99.6 fL — AB (ref 79.3–98.0)
MONO#: 0.6 10*3/uL (ref 0.1–0.9)
MONO%: 8.9 % (ref 0.0–14.0)
NEUT#: 3.9 10*3/uL (ref 1.5–6.5)
NEUT%: 54.5 % (ref 39.0–75.0)
NRBC: 0 % (ref 0–0)
PLATELETS: 221 10*3/uL (ref 140–400)
RBC: 4.62 10*6/uL (ref 4.20–5.82)
RDW: 13.7 % (ref 11.0–14.6)
WBC: 7.1 10*3/uL (ref 4.0–10.3)
lymph#: 2.1 10*3/uL (ref 0.9–3.3)

## 2015-11-10 LAB — PHOSPHORUS: PHOSPHORUS: 3.3 mg/dL (ref 2.5–4.6)

## 2015-11-10 LAB — T3, FREE: T3 FREE: 2.5 pg/mL (ref 2.3–4.2)

## 2015-11-10 LAB — MAGNESIUM (CC13): MAGNESIUM: 2.2 mg/dL (ref 1.5–2.5)

## 2015-11-10 LAB — LACTATE DEHYDROGENASE (CC13): LDH: 130 U/L (ref 125–245)

## 2015-11-10 LAB — T4, FREE: Free T4: 0.98 ng/dL (ref 0.80–1.80)

## 2015-11-10 LAB — TSH CHCC: TSH: 4.206 m[IU]/L — AB (ref 0.320–4.118)

## 2015-11-10 MED ORDER — SODIUM CHLORIDE 0.9 % IV SOLN
240.0000 mg | Freq: Once | INTRAVENOUS | Status: AC
  Administered 2015-11-10: 240 mg via INTRAVENOUS
  Filled 2015-11-10: qty 24

## 2015-11-10 MED ORDER — SODIUM CHLORIDE 0.9 % IV SOLN
Freq: Once | INTRAVENOUS | Status: AC
  Administered 2015-11-10: 12:00:00 via INTRAVENOUS

## 2015-11-10 NOTE — Telephone Encounter (Signed)
per pof to sch scan-pt req to get contrast on 12/5 visit

## 2015-11-10 NOTE — Progress Notes (Signed)
[place] [phone]   Fax:(336) [place], [place] [address] [place] [address]  DIAGNOSIS: Stage IV (T3, N3, M1b) non-small cell lung cancer, adenocarcinoma diagnosed in January 2016 presented with right upper lobe lung mass in addition to mediastinal lymphadenopathy and large right pleural effusion as well as bone metastasis.  PRIOR THERAPY:  1. Status post Pleurx catheter placement by Dr. Servando Snare for drainage of the right pleural effusion.  2. Systemic chemotherapy with carboplatin for AUC of 5 and Alimta 500 MG/M2 every 3 weeks. Status post 6 cycles.  CURRENT THERAPY: Patient is participating in the [place] 370 clinical trial and has been randomized to group A arm B-3 to receive Nivolumab 240 mg given every 2 weeks. First cycle expected 06/10/2015. Status post 11 cycles.  INTERVAL HISTORY: Omar Peters 80 y.o. male returns to the clinic today for follow-up visit accompanied by his wife. The patient is feeling fine today with no specific complaints. He chronically feels weak but these symptoms are not worse than in case prior visit. He is tolerating his treatment with Nivolumab fairly well with no significant adverse effects. The patient denied having any significant skin rash, diarrhea. He has intermittent constipation controlled with laxatives.He has no nausea or vomiting. No significant headache or visual changes. He has no chest pain, shortness of breath, cough or hemoptysis. He has no significant weight loss or night sweats. He is eager to proceed with cycle #12  MEDICAL HISTORY: Past Medical History  Diagnosis Date  . Hypertension   . GERD (gastroesophageal reflux disease)   . Shortness of breath dyspnea     01/14/15 at night and has to sit up  . Anxiety     due to diagnosis of Stage 4 Lung Cancer  . Macular degeneration of right eye   . Cancer ([place])     Lung  . Diabetes mellitus without  complication (HCC)     ALLERGIES:  is allergic to amitriptyline and marinol.  MEDICATIONS:  Current Outpatient Prescriptions  Medication Sig Dispense Refill  . amitriptyline (ELAVIL) 10 MG tablet     . aspirin 81 MG tablet Take 81 mg by mouth daily.    . B Complex-Biotin-FA (B-COMPLEX PO) Take 1 each by mouth daily.    Marland Kitchen HYDROcodone-acetaminophen (NORCO) 5-325 MG per tablet Take 1 tablet by mouth every 6 (six) hours as needed for moderate pain. 30 tablet 0  . metFORMIN (GLUCOPHAGE-XR) 500 MG 24 hr tablet Take 500 mg by mouth daily with breakfast.    . prochlorperazine (COMPAZINE) 10 MG tablet TAKE ONE TABLET BY MOUTH EVERY 6 HOURS AS NEEDED FOR NAUSEA AND VOMITING 30 tablet 0  . ranitidine (ZANTAC) 150 MG tablet Take 150 mg by mouth daily.    Marland Kitchen senna-docusate (SENNA S) 8.6-50 MG per tablet Take 2 tablets by mouth daily.     No current facility-administered medications for this visit.    SURGICAL HISTORY:  Past Surgical History  Procedure Laterality Date  . Vein ligation and stripping  1977  . Chest tube insertion Right 01/15/2015    Procedure: INSERTION PLEURAL DRAINAGE CATHETER WITH ULTRASOUND AND FLURO GUIDANCE;  Surgeon: Grace Isaac, MD;  Location: [facility];  Service: Thoracic;  Laterality: Right;  . Removal of pleural drainage catheter Right 03/26/2015    Procedure: REMOVAL OF PLEURAL DRAINAGE CATHETER;  Surgeon: Grace Isaac, MD;  Location: [facility];  Service: Thoracic;  Laterality:  Right;    REVIEW OF SYSTEMS:  Constitutional:Positive for weakness as mentioned in history of present illness Eyes: negative Ears, nose, mouth, throat, and face: negative Respiratory: negative Cardiovascular: negative Gastrointestinal: Positive for intermittent constipation Genitourinary:negative Integument/breast: negative Hematologic/lymphatic: negative Musculoskeletal:negative Neurological: negative Behavioral/Psych: negative Endocrine: negative Allergic/Immunologic: negative    PHYSICAL EXAMINATION: General appearance: alert, cooperative and no distress Head: Normocephalic, without obvious abnormality, atraumatic Neck: no adenopathy, no JVD, supple, symmetrical, trachea midline and thyroid not enlarged, symmetric, no tenderness/mass/nodules Lymph nodes: Cervical, supraclavicular, and axillary nodes normal. Resp: clear to auscultation on the left, decreased breath sounds on the right Back: symmetric, no curvature. ROM normal. No CVA tenderness. Cardio: regular rate and rhythm, S1, S2 normal, no murmur, click, rub or gallop GI: soft, non-tender; bowel sounds normal; no masses,  no organomegaly Extremities: extremities normal, atraumatic, no cyanosis or edema Neurologic: Alert and oriented X 3, normal strength and tone. Normal symmetric reflexes. Normal coordination and gait  ECOG PERFORMANCE STATUS: 1 - Symptomatic but completely ambulatory  Blood pressure 177/82, pulse 90, temperature 97.6 F (36.4 C), resp. rate 18, height '5\' 6"'$  (1.676 m), weight 145 lb 11.2 oz (66.089 kg), SpO2 96 %.  LABORATORY DATA: CBC Latest Ref Rng 11/10/2015 10/25/2015 10/13/2015  WBC 4.0 - 10.3 10e3/uL 7.1 7.3 7.8  Hemoglobin 13.0 - 17.1 g/dL 15.4 15.2 14.6  Hematocrit 38.4 - 49.9 % 46.0 45.6 43.5  Platelets 140 - 400 10e3/uL 221 211 240   CMP Latest Ref Rng 11/10/2015 10/25/2015 10/13/2015  Glucose 70 - 140 mg/dl 129 146(H) 160(H)  BUN 7.0 - 26.0 mg/dL 10.4 9.5 13.8  Creatinine 0.7 - 1.3 mg/dL 0.9 0.9 0.9  Sodium 136 - 145 mEq/L 142 140 142  Potassium 3.5 - 5.1 mEq/L 3.8 3.7 3.8  Chloride 96 - 112 mmol/L - - -  CO2 22 - 29 mEq/L 28 26 30(H)  Calcium 8.4 - 10.4 mg/dL 10.2 10.1 10.4  Total Protein 6.4 - 8.3 g/dL 7.8 7.3 7.4  Total Bilirubin 0.20 - 1.20 mg/dL 0.37 0.34 0.31  Alkaline Phos 40 - 150 U/L 100 93 94  AST 5 - 34 U/L '19 19 16  '$ ALT 0 - 55 U/L '16 17 15     '$ RADIOGRAPHIC STUDIES: No results found.  ASSESSMENT AND PLAN: This is a very pleasant 80 years old white male  with history of stage IV non-small cell lung cancer, adenocarcinoma status post induction chemotherapy with carboplatin and Alimta and currently undergoing treatment with immunotherapy according to the BMS checkmated 370 clinical trial. He was randomized to the Nivolumab arm 240 mg IV every 2 weeks. He is status post 11 cycles and tolerating his treatment fairly well. The most recent CT scan of the chest, abdomen and pelvis showed no significant evidence for disease progression except for enlarging lytic lesion in the right iliac bone which could be disease progression versus healing process any bone lesion. The scan also showed decrease in the size of the right upper lobe mass. Recommended for the patient to proceed with cycle #12 of his immunotherapy today as scheduled.  He would come back for follow-up visit in 2 weeks for reevaluation before starting cycle #13. After Cycle 13, will proceed with follow up staging CTs to determine response to therapy, that is  on the week of 12/30/2015, and to follow-up with Dr. Earlie Server on 01/08/2016 as plannedTo discuss the results. The patient was advised to call immediately if he has any concerning symptoms in the interval. The patient voices understanding  of current disease status and treatment options and is in agreement with the current care plan.  All questions were answered. The patient knows to call the clinic with any problems, questions or concerns. We can certainly see the patient much sooner if necessary.  Rondel Jumbo, PA-C 11/10/2015  ADDENDUM: Hematology/Oncology Attending: I had a face to face encounter with the patient today. I recommended his care plan. This is a very pleasant 80 years old white male with a stage IV non-small cell lung cancer, adenocarcinoma currently undergoing treatment on the beam mass checkmated 370 clinical trial with Nivolumab 240 mg IV every 2 weeks status post 11 cycles. The patient has been tolerating his treatment  well with no significant adverse effects. He denied having any skin rash, diarrhea, nausea or vomiting. He denied having any chest pain or shortness breath but has mild fatigue. I recommended for the patient to proceed with cycle #12 today as scheduled. He would come back for follow-up visit in 2 weeks for evaluation before starting cycle #13. The patient was advised to call immediately if he has any concerning symptoms in the interval.  Disclaimer: This note was dictated with voice recognition software. Similar sounding words can inadvertently be transcribed and may be missed upon review. Eilleen Kempf., MD 11/10/2015

## 2015-11-10 NOTE — Patient Instructions (Signed)
[facility] Discharge Instructions for Patients Receiving Chemotherapy  Today you received the following chemotherapy agents nivolumab  To help prevent nausea and vomiting after your treatment, we encourage you to take your nausea medication    If you develop nausea and vomiting that is not controlled by your nausea medication, call the clinic.   BELOW ARE SYMPTOMS THAT SHOULD BE REPORTED IMMEDIATELY:  *FEVER GREATER THAN 100.5 F  *CHILLS WITH OR WITHOUT FEVER  NAUSEA AND VOMITING THAT IS NOT CONTROLLED WITH YOUR NAUSEA MEDICATION  *UNUSUAL SHORTNESS OF BREATH  *UNUSUAL BRUISING OR BLEEDING  TENDERNESS IN MOUTH AND THROAT WITH OR WITHOUT PRESENCE OF ULCERS  *URINARY PROBLEMS  *BOWEL PROBLEMS  UNUSUAL RASH Items with * indicate a potential emergency and should be followed up as soon as possible.  Feel free to call the clinic you have any questions or concerns. The clinic phone number is [phone].  Please show the [facility] at check-in to the Emergency Department and triage nurse.

## 2015-11-10 NOTE — Progress Notes (Signed)
[id number]: Group A, Arm B3: Cycle 12/week 22 I met with patient and spouse after patient's lab appointment. Resting vital signs obtained: B/P 177/82, R=18, T=97.6, HR= 90, O2 sat. @ 96%. Patient maintaining weight @ 145.7 pounds. Patient seen today in shared appointment with PA Sharene Butters and Dr. Julien Nordmann. Patient denies any new symptoms and no medication changes. He and spouse recently returned from a two week family fishing trip at the beach and patient reports to have done well with no concerns. Patient states no change in appetite, energy level remains the same from last visit, not better but not worsening. Patient denies any; nausea, vomiting, bowel or bladder concerns, SOB, cough, rashes. No edema present. Patient denies any pain, or neurological issues or headaches. Patient's TSH has resulted in past slightly above normal, per Dr. Julien Nordmann will continue to check TSH levels and follow up as needed. Patient with no new symptoms r/t thyroid at today's visit. Per Dr. Julien Nordmann and PA's assessment of patient and review of labs and NCS labs, patient to continue with Cycle 12 treatment today. All patient's and spouses questions answered to their satisfaction, they deny further questions at this time. I encouraged patient and spouse to contact Dr. Julien Nordmann, clinic or myself with any questions or concerns.  Study generated vial assignment given to PharmD. Raul Del. Treatment infusion sign instructions given to Demetrius Revel RN, all question answered.  Next scheduled appt for 12/05 Patient to have CT scan week of 12/12, with MD appt to follow on 12/21 (cycle 14) Adele Dan, RN, BSN Clinical Research 11/10/2015 11:29 AM

## 2015-11-17 ENCOUNTER — Other Ambulatory Visit: Payer: Self-pay | Admitting: Medical Oncology

## 2015-11-17 DIAGNOSIS — Z79899 Other long term (current) drug therapy: Secondary | ICD-10-CM

## 2015-11-17 DIAGNOSIS — C3411 Malignant neoplasm of upper lobe, right bronchus or lung: Secondary | ICD-10-CM

## 2015-11-22 ENCOUNTER — Ambulatory Visit (HOSPITAL_BASED_OUTPATIENT_CLINIC_OR_DEPARTMENT_OTHER): Payer: Medicare Other

## 2015-11-22 ENCOUNTER — Other Ambulatory Visit (HOSPITAL_COMMUNITY)
Admission: RE | Admit: 2015-11-22 | Discharge: 2015-11-22 | Disposition: A | Discharge status: Home / Self Care | Payer: Medicare Other | Source: Ambulatory Visit | Attending: Internal Medicine | Admitting: Internal Medicine

## 2015-11-22 ENCOUNTER — Encounter: Payer: Self-pay | Admitting: Medical Oncology

## 2015-11-22 ENCOUNTER — Encounter: Payer: Self-pay | Admitting: Internal Medicine

## 2015-11-22 ENCOUNTER — Other Ambulatory Visit: Payer: Self-pay | Admitting: Medical Oncology

## 2015-11-22 ENCOUNTER — Ambulatory Visit (HOSPITAL_BASED_OUTPATIENT_CLINIC_OR_DEPARTMENT_OTHER): Payer: Medicare Other | Admitting: Internal Medicine

## 2015-11-22 ENCOUNTER — Telehealth: Payer: Self-pay | Admitting: Internal Medicine

## 2015-11-22 ENCOUNTER — Other Ambulatory Visit (HOSPITAL_BASED_OUTPATIENT_CLINIC_OR_DEPARTMENT_OTHER): Payer: Medicare Other

## 2015-11-22 ENCOUNTER — Encounter: Payer: Self-pay | Admitting: Oncology

## 2015-11-22 VITALS — BP 166/81 | HR 110 | Temp 98.4°F | Resp 19 | Ht 66.0 in | Wt 144.3 lb

## 2015-11-22 DIAGNOSIS — Z79899 Other long term (current) drug therapy: Secondary | ICD-10-CM

## 2015-11-22 DIAGNOSIS — Z5112 Encounter for antineoplastic immunotherapy: Secondary | ICD-10-CM | POA: Diagnosis not present

## 2015-11-22 DIAGNOSIS — C3411 Malignant neoplasm of upper lobe, right bronchus or lung: Secondary | ICD-10-CM | POA: Diagnosis present

## 2015-11-22 DIAGNOSIS — C7951 Secondary malignant neoplasm of bone: Secondary | ICD-10-CM

## 2015-11-22 DIAGNOSIS — Z006 Encounter for examination for normal comparison and control in clinical research program: Secondary | ICD-10-CM

## 2015-11-22 LAB — COMPREHENSIVE METABOLIC PANEL
ALT: 16 U/L (ref 0–55)
AST: 18 U/L (ref 5–34)
Albumin: 3.2 g/dL — ABNORMAL LOW (ref 3.5–5.0)
Alkaline Phosphatase: 92 U/L (ref 40–150)
Anion Gap: 8 mEq/L (ref 3–11)
BUN: 10.9 mg/dL (ref 7.0–26.0)
CALCIUM: 10.2 mg/dL (ref 8.4–10.4)
CHLORIDE: 105 meq/L (ref 98–109)
CO2: 28 meq/L (ref 22–29)
CREATININE: 1.1 mg/dL (ref 0.7–1.3)
EGFR: 64 mL/min/{1.73_m2} — ABNORMAL LOW (ref >90)
Glucose: 144 mg/dl — ABNORMAL HIGH (ref 70–140)
POTASSIUM: 3.8 meq/L (ref 3.5–5.1)
SODIUM: 141 meq/L (ref 136–145)
Total Bilirubin: 0.4 mg/dL (ref 0.20–1.20)
Total Protein: 7.5 g/dL (ref 6.4–8.3)

## 2015-11-22 LAB — CBC WITH DIFFERENTIAL/PLATELET
BASO%: 0.5 % (ref 0.0–2.0)
Basophils Absolute: 0 10*3/uL (ref 0.0–0.1)
EOS ABS: 0.6 10*3/uL — AB (ref 0.0–0.5)
EOS%: 6.5 % (ref 0.0–7.0)
HEMATOCRIT: 43.5 % (ref 38.4–49.9)
HGB: 14.5 g/dL (ref 13.0–17.1)
LYMPH%: 26.4 % (ref 14.0–49.0)
MCH: 32.6 pg (ref 27.2–33.4)
MCHC: 33.2 g/dL (ref 32.0–36.0)
MCV: 98.1 fL — ABNORMAL HIGH (ref 79.3–98.0)
MONO#: 0.9 10*3/uL (ref 0.1–0.9)
MONO%: 10.9 % (ref 0.0–14.0)
NEUT%: 55.7 % (ref 39.0–75.0)
NEUTROS ABS: 4.8 10*3/uL (ref 1.5–6.5)
PLATELETS: 225 10*3/uL (ref 140–400)
RBC: 4.44 10*6/uL (ref 4.20–5.82)
RDW: 13.1 % (ref 11.0–14.6)
WBC: 8.5 10*3/uL (ref 4.0–10.3)
lymph#: 2.3 10*3/uL (ref 0.9–3.3)

## 2015-11-22 LAB — LACTATE DEHYDROGENASE: LDH: 144 U/L (ref 125–245)

## 2015-11-22 LAB — TSH: TSH: 4.815 m(IU)/L — ABNORMAL HIGH (ref 0.320–4.118)

## 2015-11-22 LAB — T4, FREE: FREE T4: 0.94 ng/dL (ref 0.80–1.80)

## 2015-11-22 LAB — T3, FREE: T3 FREE: 2.6 pg/mL (ref 2.3–4.2)

## 2015-11-22 LAB — MAGNESIUM: MAGNESIUM: 2.1 mg/dL (ref 1.5–2.5)

## 2015-11-22 LAB — PHOSPHORUS: Phosphorus: 3.8 mg/dL (ref 2.5–4.6)

## 2015-11-22 MED ORDER — SODIUM CHLORIDE 0.9 % IV SOLN
Freq: Once | INTRAVENOUS | Status: AC
  Administered 2015-11-22: 13:00:00 via INTRAVENOUS

## 2015-11-22 MED ORDER — INV-NIVOLUMAB CHEMO INJECTION 100 MG/10 ML BMS CA 209-370
240.0000 mg | Freq: Once | INTRAVENOUS | Status: AC
  Administered 2015-11-22: 240 mg via INTRAVENOUS
  Filled 2015-11-22: qty 24

## 2015-11-22 NOTE — Addendum Note (Signed)
Addended by: Ardeen Garland on: 11/22/2015 04:32 PM   Modules accepted: Medications

## 2015-11-22 NOTE — Progress Notes (Signed)
BMS 370 Cycle 13/week 24 I met with patient and spouse today after patient completed study required PRO's with research assistant, Remer Macho, and had labs drawn. Resting vitals obtained; B/P= 166/81, HR=110, R=19, Temp= 98.4, O2 saturation = 100%, and patient maintaining weight at 144.3 lbs. Patient's B/P elevated today to a Grade 3, patient does have hx of HTN and patient reports to have seen his PCP this past Tuesday (12/6), patient reports PCP is aware of elevated B/P and no new medication has been prescribed for it and PCP will continue to follow. Patient denies having nausea, vomiting, skin problems, bleeding, diarrhea or neuro problems. Patient also denies any breathing problems. Patient continues to report no improvement in energy level or appetite. Patient states having some soreness to right should that is the result of fishing, from throwing the line out for fishing and states he is using an OTC icey hot type patch, and reports this is helping the ache and does not rate ache d/t it not being a constat. Patient denies any other new symptoms. Dr. Julien Nordmann reviewed patient's labs, as well as, NCS labs and per MD's review and assessment of patient today, patient was cleared to continue with Cycle 13 of study drug Nivolumab. All patient's and spouses question answered to their satisfaction. Patient knows that he is scheduled for CT on the 14th of this month and Cycle 14th to follow on 12/21. I thanked patient and spouse for their time and continued support of study and encouraged them to call clinic, Dr. Julien Nordmann or myself with any questions or concerns. Study generated vial assignment information sheet provided to Pharmacist Melissa G.  Infusion sign provided to Mayra Reel, RN. All questions answered Adele Dan, RN, Clinical Research 11/22/2015 2:13 PM

## 2015-11-22 NOTE — Telephone Encounter (Signed)
gave pt contrast-pt sch made out for next trmt

## 2015-11-22 NOTE — Progress Notes (Signed)
[facility] [phone]   Fax:(336) [place], [place] [address] [place] [address]  DIAGNOSIS: Stage IV (T3, N3, M1b) non-small cell lung cancer, adenocarcinoma diagnosed in January 2016 presented with right upper lobe lung mass in addition to mediastinal lymphadenopathy and large right pleural effusion as well as bone metastasis.  PRIOR THERAPY:  1. Status post Pleurx catheter placement by Dr. Servando Snare for drainage of the right pleural effusion.  2. Systemic chemotherapy with carboplatin for AUC of 5 and Alimta 500 MG/M2 every 3 weeks. Status post 6 cycles.  CURRENT THERAPY: Patient is participating in the [place] 370 clinical trial and has been randomized to group A arm B-3 to receive Nivolumab 240 mg given every 2 weeks. First cycle expected 06/10/2015. Status post 12 cycles.  INTERVAL HISTORY: Omar Peters 80 y.o. male returns to the clinic today for follow-up visit accompanied by his wife. The patient is feeling fine today with no specific complaints. He is tolerating his treatment with Nivolumab fairly well with no significant adverse effects. The patient denied having any significant skin rash, diarrhea. He has no nausea or vomiting. No significant headache or visual changes. He has no chest pain, shortness of breath, cough or hemoptysis. He has no significant weight loss or night sweats. He has some right shoulder pain but he does a lot of fishing. He is here today to start cycle #13 of his immunotherapy.  MEDICAL HISTORY: Past Medical History  Diagnosis Date  . Hypertension   . GERD (gastroesophageal reflux disease)   . Shortness of breath dyspnea     01/14/15 at night and has to sit up  . Anxiety     due to diagnosis of Stage 4 Lung Cancer  . Macular degeneration of right eye   . Cancer ([place])     Lung  . Diabetes mellitus without complication (HCC)     ALLERGIES:  is allergic to amitriptyline  and marinol.  MEDICATIONS:  Current Outpatient Prescriptions  Medication Sig Dispense Refill  . amitriptyline (ELAVIL) 10 MG tablet     . aspirin 81 MG tablet Take 81 mg by mouth daily.    . B Complex-Biotin-FA (B-COMPLEX PO) Take 1 each by mouth daily.    Marland Kitchen HYDROcodone-acetaminophen (NORCO) 5-325 MG per tablet Take 1 tablet by mouth every 6 (six) hours as needed for moderate pain. 30 tablet 0  . metFORMIN (GLUCOPHAGE-XR) 500 MG 24 hr tablet Take 500 mg by mouth daily with breakfast.    . prochlorperazine (COMPAZINE) 10 MG tablet TAKE ONE TABLET BY MOUTH EVERY 6 HOURS AS NEEDED FOR NAUSEA AND VOMITING 30 tablet 0  . ranitidine (ZANTAC) 150 MG tablet Take 150 mg by mouth daily.    Marland Kitchen senna-docusate (SENNA S) 8.6-50 MG per tablet Take 2 tablets by mouth daily.     No current facility-administered medications for this visit.    SURGICAL HISTORY:  Past Surgical History  Procedure Laterality Date  . Vein ligation and stripping  1977  . Chest tube insertion Right 01/15/2015    Procedure: INSERTION PLEURAL DRAINAGE CATHETER WITH ULTRASOUND AND FLURO GUIDANCE;  Surgeon: Grace Isaac, MD;  Location: [facility];  Service: Thoracic;  Laterality: Right;  . Removal of pleural drainage catheter Right 03/26/2015    Procedure: REMOVAL OF PLEURAL DRAINAGE CATHETER;  Surgeon: Grace Isaac, MD;  Location: [facility];  Service: Thoracic;  Laterality: Right;    REVIEW  OF SYSTEMS:  A comprehensive review of systems was negative except for: Constitutional: positive for fatigue   PHYSICAL EXAMINATION: General appearance: alert, cooperative and no distress Head: Normocephalic, without obvious abnormality, atraumatic Neck: no adenopathy, no JVD, supple, symmetrical, trachea midline and thyroid not enlarged, symmetric, no tenderness/mass/nodules Lymph nodes: Cervical, supraclavicular, and axillary nodes normal. Resp: clear to auscultation bilaterally Back: symmetric, no curvature. ROM normal. No CVA  tenderness. Cardio: regular rate and rhythm, S1, S2 normal, no murmur, click, rub or gallop GI: soft, non-tender; bowel sounds normal; no masses,  no organomegaly Extremities: extremities normal, atraumatic, no cyanosis or edema Neurologic: Alert and oriented X 3, normal strength and tone. Normal symmetric reflexes. Normal coordination and gait  ECOG PERFORMANCE STATUS: 1 - Symptomatic but completely ambulatory  Blood pressure 166/81, pulse 110, temperature 98.4 F (36.9 C), temperature source Oral, resp. rate 19, height '5\' 6"'$  (1.676 m), weight 144 lb 4.8 oz (65.454 kg), SpO2 100 %.  LABORATORY DATA: Lab Results  Component Value Date   WBC 8.5 11/22/2015   HGB 14.5 11/22/2015   HCT 43.5 11/22/2015   MCV 98.1* 11/22/2015   PLT 225 11/22/2015      Chemistry      Component Value Date/Time   NA 141 11/22/2015 1028   NA 138 01/15/2015 0608   K 3.8 11/22/2015 1028   K 3.5 01/15/2015 0608   CL 97 01/15/2015 0608   CO2 28 11/22/2015 1028   CO2 34* 01/15/2015 0608   BUN 10.9 11/22/2015 1028   BUN 13 01/15/2015 0608   CREATININE 1.1 11/22/2015 1028   CREATININE 0.96 01/15/2015 0608   GLU 231* 03/10/2015 1539      Component Value Date/Time   CALCIUM 10.2 11/22/2015 1028   CALCIUM 9.4 01/15/2015 0608   ALKPHOS 92 11/22/2015 1028   ALKPHOS 102 01/15/2015 0608   AST 18 11/22/2015 1028   AST 42* 01/15/2015 0608   ALT 16 11/22/2015 1028   ALT 38 01/15/2015 0608   BILITOT 0.40 11/22/2015 1028   BILITOT 0.8 01/15/2015 0608       RADIOGRAPHIC STUDIES: No results found.  ASSESSMENT AND PLAN: This is a very pleasant 80 years old white male with history of stage IV non-small cell lung cancer, adenocarcinoma status post induction chemotherapy was carboplatin and Alimta and currently undergoing treatment with immunotherapy according to the BMS checkmated 370 clinical trial. He was randomized to the Nivolumab arm 240 mg IV every 2 weeks. He is status post 12 cycles and tolerating his  treatment fairly well. I recommended for the patient to proceed with cycle #13 today as scheduled. He would come back for follow-up visit in 2 weeks for evaluation after repeating CT scan of the chest, abdomen and pelvis for restaging of his disease before starting cycle #14. The patient was advised to call immediately if he has any concerning symptoms in the interval. The patient voices understanding of current disease status and treatment options and is in agreement with the current care plan.  All questions were answered. The patient knows to call the clinic with any problems, questions or concerns. We can certainly see the patient much sooner if necessary.  Disclaimer: This note was dictated with voice recognition software. Similar sounding words can inadvertently be transcribed and may not be corrected upon review.

## 2015-11-22 NOTE — Progress Notes (Signed)
11/22/15 - BMS [id number] - Questionnaires (PRO's) - Patient into cancer center for routine visit.  Patient given PRO's upon arrival to the cancer center.  The patient completed the PRO's.  The PRO's were checked for completeness. The patient was thanked for his continued support in this clinical trial. Omar Peters 11/22/15 - 10:30 am

## 2015-11-22 NOTE — Patient Instructions (Signed)
[facility] Discharge Instructions for Patients Receiving Chemotherapy  Today you received the following chemotherapy agents Opdivo.  To help prevent nausea and vomiting after your treatment, we encourage you to take your nausea medication as directed.   If you develop nausea and vomiting that is not controlled by your nausea medication, call the clinic.   BELOW ARE SYMPTOMS THAT SHOULD BE REPORTED IMMEDIATELY:  *FEVER GREATER THAN 100.5 F  *CHILLS WITH OR WITHOUT FEVER  NAUSEA AND VOMITING THAT IS NOT CONTROLLED WITH YOUR NAUSEA MEDICATION  *UNUSUAL SHORTNESS OF BREATH  *UNUSUAL BRUISING OR BLEEDING  TENDERNESS IN MOUTH AND THROAT WITH OR WITHOUT PRESENCE OF ULCERS  *URINARY PROBLEMS  *BOWEL PROBLEMS  UNUSUAL RASH Items with * indicate a potential emergency and should be followed up as soon as possible.  Feel free to call the clinic you have any questions or concerns. The clinic phone number is [phone].  Please show the CHEMO ALERT CARD at check-in to the Emergency Department and triage nurse.    

## 2015-12-01 ENCOUNTER — Observation Stay (HOSPITAL_COMMUNITY)
Admission: EM | Admit: 2015-12-01 | Discharge: 2015-12-02 | Disposition: A | Discharge status: Home / Self Care | Payer: Medicare Other | Attending: Internal Medicine | Admitting: Internal Medicine

## 2015-12-01 ENCOUNTER — Encounter (HOSPITAL_COMMUNITY): Payer: Self-pay | Admitting: Emergency Medicine

## 2015-12-01 ENCOUNTER — Emergency Department (HOSPITAL_COMMUNITY): Payer: Medicare Other

## 2015-12-01 ENCOUNTER — Other Ambulatory Visit: Payer: Self-pay

## 2015-12-01 ENCOUNTER — Ambulatory Visit (HOSPITAL_COMMUNITY)
Admission: RE | Admit: 2015-12-01 | Discharge: 2015-12-01 | Disposition: A | Discharge status: Home / Self Care | Payer: Medicare Other | Source: Ambulatory Visit | Attending: Physician Assistant | Admitting: Physician Assistant

## 2015-12-01 ENCOUNTER — Encounter (HOSPITAL_COMMUNITY): Payer: Self-pay

## 2015-12-01 ENCOUNTER — Telehealth: Payer: Self-pay | Admitting: Medical Oncology

## 2015-12-01 DIAGNOSIS — R4182 Altered mental status, unspecified: Secondary | ICD-10-CM

## 2015-12-01 DIAGNOSIS — C7951 Secondary malignant neoplasm of bone: Secondary | ICD-10-CM

## 2015-12-01 DIAGNOSIS — E876 Hypokalemia: Secondary | ICD-10-CM | POA: Diagnosis present

## 2015-12-01 DIAGNOSIS — E860 Dehydration: Secondary | ICD-10-CM | POA: Insufficient documentation

## 2015-12-01 DIAGNOSIS — C3411 Malignant neoplasm of upper lobe, right bronchus or lung: Secondary | ICD-10-CM

## 2015-12-01 DIAGNOSIS — G9341 Metabolic encephalopathy: Principal | ICD-10-CM | POA: Insufficient documentation

## 2015-12-01 DIAGNOSIS — I2510 Atherosclerotic heart disease of native coronary artery without angina pectoris: Secondary | ICD-10-CM | POA: Insufficient documentation

## 2015-12-01 DIAGNOSIS — G9340 Encephalopathy, unspecified: Secondary | ICD-10-CM | POA: Diagnosis not present

## 2015-12-01 DIAGNOSIS — K219 Gastro-esophageal reflux disease without esophagitis: Secondary | ICD-10-CM | POA: Insufficient documentation

## 2015-12-01 DIAGNOSIS — F419 Anxiety disorder, unspecified: Secondary | ICD-10-CM | POA: Insufficient documentation

## 2015-12-01 DIAGNOSIS — N400 Enlarged prostate without lower urinary tract symptoms: Secondary | ICD-10-CM | POA: Insufficient documentation

## 2015-12-01 DIAGNOSIS — Z7984 Long term (current) use of oral hypoglycemic drugs: Secondary | ICD-10-CM | POA: Insufficient documentation

## 2015-12-01 DIAGNOSIS — I10 Essential (primary) hypertension: Secondary | ICD-10-CM | POA: Insufficient documentation

## 2015-12-01 DIAGNOSIS — H3530 Unspecified macular degeneration: Secondary | ICD-10-CM | POA: Insufficient documentation

## 2015-12-01 DIAGNOSIS — J984 Other disorders of lung: Secondary | ICD-10-CM

## 2015-12-01 DIAGNOSIS — Z7982 Long term (current) use of aspirin: Secondary | ICD-10-CM | POA: Insufficient documentation

## 2015-12-01 DIAGNOSIS — I4510 Unspecified right bundle-branch block: Secondary | ICD-10-CM | POA: Insufficient documentation

## 2015-12-01 DIAGNOSIS — E119 Type 2 diabetes mellitus without complications: Secondary | ICD-10-CM | POA: Insufficient documentation

## 2015-12-01 DIAGNOSIS — R4701 Aphasia: Secondary | ICD-10-CM | POA: Diagnosis present

## 2015-12-01 DIAGNOSIS — Z79899 Other long term (current) drug therapy: Secondary | ICD-10-CM | POA: Insufficient documentation

## 2015-12-01 DIAGNOSIS — Z9221 Personal history of antineoplastic chemotherapy: Secondary | ICD-10-CM | POA: Insufficient documentation

## 2015-12-01 DIAGNOSIS — K7460 Unspecified cirrhosis of liver: Secondary | ICD-10-CM | POA: Insufficient documentation

## 2015-12-01 DIAGNOSIS — Z87891 Personal history of nicotine dependence: Secondary | ICD-10-CM | POA: Insufficient documentation

## 2015-12-01 LAB — CBC
HCT: 41 % (ref 39.0–52.0)
HEMOGLOBIN: 14 g/dL (ref 13.0–17.0)
MCH: 33 pg (ref 26.0–34.0)
MCHC: 34.1 g/dL (ref 30.0–36.0)
MCV: 96.7 fL (ref 78.0–100.0)
Platelets: 221 10*3/uL (ref 150–400)
RBC: 4.24 MIL/uL (ref 4.22–5.81)
RDW: 13 % (ref 11.5–15.5)
WBC: 8.9 10*3/uL (ref 4.0–10.5)

## 2015-12-01 LAB — I-STAT TROPONIN, ED: TROPONIN I, POC: 0.01 ng/mL (ref 0.00–0.08)

## 2015-12-01 LAB — COMPREHENSIVE METABOLIC PANEL
ALK PHOS: 83 U/L (ref 38–126)
ALT: 18 U/L (ref 17–63)
ANION GAP: 7 (ref 5–15)
AST: 20 U/L (ref 15–41)
Albumin: 3.5 g/dL (ref 3.5–5.0)
BILIRUBIN TOTAL: 0.3 mg/dL (ref 0.3–1.2)
BUN: 9 mg/dL (ref 6–20)
CALCIUM: 9.2 mg/dL (ref 8.9–10.3)
CO2: 32 mmol/L (ref 22–32)
CREATININE: 0.92 mg/dL (ref 0.61–1.24)
Chloride: 98 mmol/L — ABNORMAL LOW (ref 101–111)
Glucose, Bld: 123 mg/dL — ABNORMAL HIGH (ref 65–99)
Potassium: 3.3 mmol/L — ABNORMAL LOW (ref 3.5–5.1)
SODIUM: 137 mmol/L (ref 135–145)
TOTAL PROTEIN: 7.3 g/dL (ref 6.5–8.1)

## 2015-12-01 LAB — CBG MONITORING, ED: GLUCOSE-CAPILLARY: 118 mg/dL — AB (ref 65–99)

## 2015-12-01 LAB — DIFFERENTIAL
BASOS ABS: 0.1 10*3/uL (ref 0.0–0.1)
Basophils Relative: 1 %
EOS PCT: 4 %
Eosinophils Absolute: 0.3 10*3/uL (ref 0.0–0.7)
LYMPHS PCT: 21 %
Lymphs Abs: 1.8 10*3/uL (ref 0.7–4.0)
MONO ABS: 1 10*3/uL (ref 0.1–1.0)
MONOS PCT: 11 %
NEUTROS PCT: 63 %
Neutro Abs: 5.7 10*3/uL (ref 1.7–7.7)

## 2015-12-01 LAB — URINALYSIS, ROUTINE W REFLEX MICROSCOPIC
BILIRUBIN URINE: NEGATIVE
GLUCOSE, UA: NEGATIVE mg/dL
HGB URINE DIPSTICK: NEGATIVE
Ketones, ur: NEGATIVE mg/dL
Leukocytes, UA: NEGATIVE
Nitrite: NEGATIVE
PROTEIN: NEGATIVE mg/dL
Specific Gravity, Urine: 1.026 (ref 1.005–1.030)
pH: 8 (ref 5.0–8.0)

## 2015-12-01 LAB — I-STAT CHEM 8, ED
BUN: 8 mg/dL (ref 6–20)
CALCIUM ION: 1.21 mmol/L (ref 1.13–1.30)
CREATININE: 0.8 mg/dL (ref 0.61–1.24)
Chloride: 96 mmol/L — ABNORMAL LOW (ref 101–111)
GLUCOSE: 118 mg/dL — AB (ref 65–99)
HCT: 44 % (ref 39.0–52.0)
HEMOGLOBIN: 15 g/dL (ref 13.0–17.0)
Potassium: 3.3 mmol/L — ABNORMAL LOW (ref 3.5–5.1)
Sodium: 138 mmol/L (ref 135–145)
TCO2: 31 mmol/L (ref 0–100)

## 2015-12-01 LAB — GLUCOSE, CAPILLARY: Glucose-Capillary: 104 mg/dL — ABNORMAL HIGH (ref 65–99)

## 2015-12-01 LAB — APTT: APTT: 38 s — AB (ref 24–37)

## 2015-12-01 LAB — PROTIME-INR
INR: 1.18 (ref 0.00–1.49)
Prothrombin Time: 15.2 seconds (ref 11.6–15.2)

## 2015-12-01 MED ORDER — ACETAMINOPHEN 325 MG PO TABS: 650.0000 mg | ORAL_TABLET | Freq: Four times a day (QID) | ORAL | Status: DC | PRN

## 2015-12-01 MED ORDER — SODIUM CHLORIDE 0.9 % IV SOLN
INTRAVENOUS | Status: DC
  Administered 2015-12-01: via INTRAVENOUS

## 2015-12-01 MED ORDER — SODIUM CHLORIDE 0.9 % IV BOLUS (SEPSIS)
500.0000 mL | Freq: Once | INTRAVENOUS | Status: AC
Start: 2015-12-01 — End: 2015-12-01
  Administered 2015-12-01: 500 mL via INTRAVENOUS

## 2015-12-01 MED ORDER — SODIUM CHLORIDE 0.9 % IJ SOLN
3.0000 mL | Freq: Two times a day (BID) | INTRAMUSCULAR | Status: DC
  Administered 2015-12-02 (×2): 3 mL via INTRAVENOUS

## 2015-12-01 MED ORDER — IOHEXOL 300 MG/ML  SOLN
100.0000 mL | Freq: Once | INTRAMUSCULAR | Status: AC | PRN
Start: 2015-12-01 — End: 2015-12-01
  Administered 2015-12-01: 100 mL via INTRAVENOUS

## 2015-12-01 MED ORDER — HEPARIN SODIUM (PORCINE) 5000 UNIT/ML IJ SOLN
5000.0000 [IU] | Freq: Three times a day (TID) | INTRAMUSCULAR | Status: DC
  Administered 2015-12-02 (×3): 5000 [IU] via SUBCUTANEOUS
  Filled 2015-12-01 (×5): qty 1

## 2015-12-01 MED ORDER — ACETAMINOPHEN 650 MG RE SUPP: 650.0000 mg | Freq: Four times a day (QID) | RECTAL | Status: DC | PRN

## 2015-12-01 MED ORDER — SODIUM CHLORIDE 0.9 % IV BOLUS (SEPSIS)
500.0000 mL | Freq: Once | INTRAVENOUS | Status: AC
  Administered 2015-12-02: 500 mL via INTRAVENOUS

## 2015-12-01 MED ORDER — INSULIN ASPART 100 UNIT/ML ~~LOC~~ SOLN
0.0000 [IU] | Freq: Three times a day (TID) | SUBCUTANEOUS | Status: DC
  Administered 2015-12-02: 2 [IU] via SUBCUTANEOUS

## 2015-12-01 MED ORDER — ASPIRIN 81 MG PO CHEW
81.0000 mg | CHEWABLE_TABLET | Freq: Every day | ORAL | Status: DC
  Administered 2015-12-02: 81 mg via ORAL
  Filled 2015-12-01: qty 1

## 2015-12-01 MED ORDER — INSULIN ASPART 100 UNIT/ML ~~LOC~~ SOLN: 0.0000 [IU] | Freq: Every day | SUBCUTANEOUS | Status: DC

## 2015-12-01 MED ORDER — SENNOSIDES-DOCUSATE SODIUM 8.6-50 MG PO TABS
2.0000 | ORAL_TABLET | Freq: Every day | ORAL | Status: DC
  Administered 2015-12-02: 2 via ORAL
  Filled 2015-12-01: qty 2

## 2015-12-01 MED ORDER — POTASSIUM CHLORIDE CRYS ER 20 MEQ PO TBCR
40.0000 meq | EXTENDED_RELEASE_TABLET | Freq: Once | ORAL | Status: AC
  Administered 2015-12-02: 40 meq via ORAL
  Filled 2015-12-01: qty 2

## 2015-12-01 NOTE — H&P (Signed)
PCP:  Tamsen Roers, MD  Oncology [facility]  Referring provider [place]   Chief Complaint: Unable to speak  HPI: Omar Peters is a 80 y.o. male   has a past medical history of Hypertension; GERD (gastroesophageal reflux disease); Shortness of breath dyspnea; Anxiety; Macular degeneration of right eye; Cancer ([place]); and Diabetes mellitus without complication ([place]).   Presented with confusion starting this morning at 2:30 AM he woke up seemingly was speaking nonsense..  Day he had a planned CAT scan for restaging. Patient's wife reports that he had some mild confusion to days ago and seems not been having trouble communicating today. He hasn't had any fevers or chills no headache patient has not been eating well. She called to oncology who recommended further take patient to emergency department. Patient presented to [facility] long. On arrival patient was unable to answer questions in complete sentences was unable to say what his birthday or his wife's name patient appears to be disoriented. Initial workup showed CT scan of the brain showing only atrophy MRI of the brain showing no acute infarct. UA was unremarkable patient clinically didn't appear to be somewhat dehydrated.  He denies any fever have not had any pain medications today or prior. No Headaches.  Patient has stage IV non-small cell lung cancer diagnosed in January 2016 associated with pleural effusion and bone metastases. Patient undergone Pleurx catheter placement and is currently status post chemotherapy and recently has been treated with immunotherapy status post 13 cycles. He has tolerated his therapy well. Patient has a history of diabetes which is well controlled.   Hospitalist was called for admission for acute encephalopathy  Review of Systems:    Pertinent positives include: confusion poor appetite  Constitutional:  No weight loss, night sweats, Fevers, chills, fatigue, weight loss  HEENT:  No headaches, Difficulty  swallowing,Tooth/dental problems,Sore throat,  No sneezing, itching, ear ache, nasal congestion, post nasal drip,  Cardio-vascular:  No chest pain, Orthopnea, PND, anasarca, dizziness, palpitations.no Bilateral lower extremity swelling  GI:  No heartburn, indigestion, abdominal pain, nausea, vomiting, diarrhea, change in bowel habits, loss of appetite, melena, blood in stool, hematemesis Resp:  no shortness of breath at rest. No dyspnea on exertion, No excess mucus, no productive cough, No non-productive cough, No coughing up of blood.No change in color of mucus.No wheezing. Skin:  no rash or lesions. No jaundice GU:  no dysuria, change in color of urine, no urgency or frequency. No straining to urinate.  No flank pain.  Musculoskeletal:  No joint pain or no joint swelling. No decreased range of motion. No back pain.  Psych:  No change in mood or affect. No depression or anxiety. No memory loss.  Neuro: no localizing neurological complaints, no tingling, no weakness, no double vision, no gait abnormality, no slurred speech,   Otherwise ROS are negative except for above, 10 systems were reviewed  Past Medical History: Past Medical History  Diagnosis Date  . Hypertension   . GERD (gastroesophageal reflux disease)   . Shortness of breath dyspnea     01/14/15 at night and has to sit up  . Anxiety     due to diagnosis of Stage 4 Lung Cancer  . Macular degeneration of right eye   . Cancer ([place])     Lung  . Diabetes mellitus without complication [facility])    Past Surgical History  Procedure Laterality Date  . Vein ligation and stripping  1977  . Chest tube insertion Right 01/15/2015    Procedure:  INSERTION PLEURAL DRAINAGE CATHETER WITH ULTRASOUND AND FLURO GUIDANCE;  Surgeon: Grace Isaac, MD;  Location: [place];  Service: Thoracic;  Laterality: Right;  . Removal of pleural drainage catheter Right 03/26/2015    Procedure: REMOVAL OF PLEURAL DRAINAGE CATHETER;  Surgeon: Grace Isaac,  MD;  Location: [place];  Service: Thoracic;  Laterality: Right;     Medications: Prior to Admission medications   Medication Sig Start Date End Date Taking? Authorizing Provider  amitriptyline (ELAVIL) 10 MG tablet Take 10 mg by mouth at bedtime.  08/19/15  Yes Historical Provider, MD  aspirin 81 MG tablet Take 81 mg by mouth daily.   Yes Historical Provider, MD  B Complex-Biotin-FA (B-COMPLEX PO) Take 1 each by mouth daily.   Yes Historical Provider, MD  metFORMIN (GLUCOPHAGE-XR) 500 MG 24 hr tablet Take 500 mg by mouth daily with breakfast.   Yes Historical Provider, MD  ranitidine (ZANTAC) 150 MG tablet Take 150 mg by mouth daily.   Yes Historical Provider, MD  senna-docusate (SENNA S) 8.6-50 MG per tablet Take 2 tablets by mouth daily.   Yes Historical Provider, MD  HYDROcodone-acetaminophen (NORCO) 5-325 MG per tablet Take 1 tablet by mouth every 6 (six) hours as needed for moderate pain. 01/15/15   Grace Isaac, MD  prochlorperazine (COMPAZINE) 10 MG tablet TAKE ONE TABLET BY MOUTH EVERY 6 HOURS AS NEEDED FOR NAUSEA AND VOMITING 09/01/15   Carlton Adam, PA-C    Allergies:   Allergies  Allergen Reactions  . Amitriptyline Other (See Comments)    hallucinations  . Marinol [Dronabinol] Other (See Comments)    confusion    Social History:  Ambulatory   independently   Lives at home  With family     reports that he quit smoking about a year ago. He quit smokeless tobacco use about a year ago. He reports that he drinks about 4.2 oz of alcohol per week. He reports that he does not use illicit drugs.    Family History: family history includes Cancer in his brother and father; Stroke in his mother.    Physical Exam: Patient Vitals for the past 24 hrs:  BP Temp Temp src Pulse Resp SpO2  12/01/15 2115 165/92 mmHg - - 103 24 93 %  12/01/15 2019 138/70 mmHg - - 107 18 94 %  12/01/15 1938 157/84 mmHg - - 104 18 95 %  12/01/15 1926 - 97.7 F (36.5 C) - - - -  12/01/15 1904  159/83 mmHg 99.8 F (37.7 C) Oral 103 16 95 %  12/01/15 1715 144/84 mmHg - - 107 18 97 %  12/01/15 1642 166/90 mmHg - - 105 18 95 %  12/01/15 1535 (!) 165/101 mmHg 98.4 F (36.9 C) Oral 110 20 95 %    1. General:  in No Acute distress 2. Psychological: Alert but Oriented  to self but not place, situation, cannot remember all his children names. Unsure of the year but knows the month at baseline completely oriented.  3. Head/ENT:    Dry Mucous Membranes                          Head Non traumatic, neck supple                          Normal  Dentition 4. SKIN: decreased Skin turgor,  Skin clean Dry and intact no rash 5. Heart: Regular rate  and rhythm no Murmur, Rub or gallop 6. Lungs: Clear to auscultation bilaterally, no wheezes or crackles   7. Abdomen: Soft, non-tender, Non distended 8. Lower extremities: no clubbing, cyanosis, or edema 9. Neurologically strength 5 out of 5 in all 4 extremities cranial nerves II through XII intact neurologically appears to be completely intact 10. MSK: Normal range of motion  body mass index is unknown because there is no weight on file.   Labs on Admission:   Results for orders placed or performed during the hospital encounter of 12/01/15 (from the past 24 hour(s))  Protime-INR     Status: None   Collection Time: 12/01/15  4:01 PM  Result Value Ref Range   Prothrombin Time 15.2 11.6 - 15.2 seconds   INR 1.18 0.00 - 1.49  APTT     Status: Abnormal   Collection Time: 12/01/15  4:01 PM  Result Value Ref Range   aPTT 38 (H) 24 - 37 seconds  CBC     Status: None   Collection Time: 12/01/15  4:01 PM  Result Value Ref Range   WBC 8.9 4.0 - 10.5 K/uL   RBC 4.24 4.22 - 5.81 MIL/uL   Hemoglobin 14.0 13.0 - 17.0 g/dL   HCT 41.0 39.0 - 52.0 %   MCV 96.7 78.0 - 100.0 fL   MCH 33.0 26.0 - 34.0 pg   MCHC 34.1 30.0 - 36.0 g/dL   RDW 13.0 11.5 - 15.5 %   Platelets 221 150 - 400 K/uL  Differential     Status: None   Collection Time: 12/01/15  4:01 PM   Result Value Ref Range   Neutrophils Relative % 63 %   Neutro Abs 5.7 1.7 - 7.7 K/uL   Lymphocytes Relative 21 %   Lymphs Abs 1.8 0.7 - 4.0 K/uL   Monocytes Relative 11 %   Monocytes Absolute 1.0 0.1 - 1.0 K/uL   Eosinophils Relative 4 %   Eosinophils Absolute 0.3 0.0 - 0.7 K/uL   Basophils Relative 1 %   Basophils Absolute 0.1 0.0 - 0.1 K/uL  Comprehensive metabolic panel     Status: Abnormal   Collection Time: 12/01/15  4:01 PM  Result Value Ref Range   Sodium 137 135 - 145 mmol/L   Potassium 3.3 (L) 3.5 - 5.1 mmol/L   Chloride 98 (L) 101 - 111 mmol/L   CO2 32 22 - 32 mmol/L   Glucose, Bld 123 (H) 65 - 99 mg/dL   BUN 9 6 - 20 mg/dL   Creatinine, Ser 0.92 0.61 - 1.24 mg/dL   Calcium 9.2 8.9 - 10.3 mg/dL   Total Protein 7.3 6.5 - 8.1 g/dL   Albumin 3.5 3.5 - 5.0 g/dL   AST 20 15 - 41 U/L   ALT 18 17 - 63 U/L   Alkaline Phosphatase 83 38 - 126 U/L   Total Bilirubin 0.3 0.3 - 1.2 mg/dL   GFR calc non Af Amer >60 >60 mL/min   GFR calc Af Amer >60 >60 mL/min   Anion gap 7 5 - 15  I-stat troponin, ED (not at [facility], [facility])     Status: None   Collection Time: 12/01/15  4:16 PM  Result Value Ref Range   Troponin i, poc 0.01 0.00 - 0.08 ng/mL   Comment 3          I-Stat Chem 8, ED  (not at [facility], [facility])     Status: Abnormal   Collection Time: 12/01/15  4:18 PM  Result Value Ref Range   Sodium 138 135 - 145 mmol/L   Potassium 3.3 (L) 3.5 - 5.1 mmol/L   Chloride 96 (L) 101 - 111 mmol/L   BUN 8 6 - 20 mg/dL   Creatinine, Ser 0.80 0.61 - 1.24 mg/dL   Glucose, Bld 118 (H) 65 - 99 mg/dL   Calcium, Ion 1.21 1.13 - 1.30 mmol/L   TCO2 31 0 - 100 mmol/L   Hemoglobin 15.0 13.0 - 17.0 g/dL   HCT 44.0 39.0 - 52.0 %  CBG monitoring, ED     Status: Abnormal   Collection Time: 12/01/15  4:41 PM  Result Value Ref Range   Glucose-Capillary 118 (H) 65 - 99 mg/dL  Urinalysis, Routine w reflex microscopic (not at [facility])     Status: None   Collection Time: 12/01/15  8:22 PM  Result Value Ref Range    Color, Urine YELLOW YELLOW   APPearance CLEAR CLEAR   Specific Gravity, Urine 1.026 1.005 - 1.030   pH 8.0 5.0 - 8.0   Glucose, UA NEGATIVE NEGATIVE mg/dL   Hgb urine dipstick NEGATIVE NEGATIVE   Bilirubin Urine NEGATIVE NEGATIVE   Ketones, ur NEGATIVE NEGATIVE mg/dL   Protein, ur NEGATIVE NEGATIVE mg/dL   Nitrite NEGATIVE NEGATIVE   Leukocytes, UA NEGATIVE NEGATIVE    UA no evidence of UTI  No results found for: HGBA1C  Estimated Creatinine Clearance: 68.7 mL/min (by C-G formula based on Cr of 0.8).  BNP (last 3 results) No results for input(s): PROBNP in the last 8760 hours.  Other results:  I have pearsonaly reviewed this: ECG REPORT  Rate: 107  Rhythm: Right bundle branch block ST&T Change: No acute change QTC within normal limits  There were no vitals filed for this visit.   Cultures: No results found for: SDES, SPECREQUEST, CULT, REPTSTATUS   Radiological Exams on Admission: Ct Head Wo Contrast  12/01/2015  CLINICAL DATA:  Was speaking non since after waking up at 0230 hours today, unable to speak appropriately, stroke symptoms, history hypertension, diabetes mellitus, former smoker, RIGHT upper lobe lung cancer EXAM: CT HEAD WITHOUT CONTRAST TECHNIQUE: Contiguous axial images were obtained from the base of the skull through the vertex without intravenous contrast. COMPARISON:  None; correlation MR brain 04/07/2015 FINDINGS: Generalized atrophy. Normal ventricular morphology. No midline shift or mass effect. Small vessel chronic ischemic changes of deep cerebral white matter. No intracranial hemorrhage, mass lesion, or acute infarction. Visualized paranasal sinuses and mastoid air cells clear. Bones unremarkable. Atherosclerotic calcification of internal carotid and vertebral arteries at skullbase. IMPRESSION: Atrophy with small vessel chronic ischemic changes of deep cerebral white matter. No acute intracranial abnormalities. Electronically Signed   By: Lavonia Dana  M.D.   On: 12/01/2015 16:36   Ct Chest W Contrast  12/01/2015  CLINICAL DATA:  Metastatic lung cancer, ongoing chemotherapy. EXAM: CT CHEST, ABDOMEN, AND PELVIS WITH CONTRAST TECHNIQUE: Multidetector CT imaging of the chest, abdomen and pelvis was performed following the standard protocol during bolus administration of intravenous contrast. CONTRAST:  169m OMNIPAQUE IOHEXOL 300 MG/ML  SOLN COMPARISON:  10/06/2015. RECIST 1.1 Target Lesions: 1. Right upper lobe mass, approximately 5.1 cm (series 4, image 20), difficult to measure due to heterogeneity and surrounding collapse/ consolidation, as well is pleural fluid. 2. Non-target Lesions: 1. AP window lymph node, 9 mm (image 23), previously 11 mm. 2. Subcarinal lymph node, present. 3. Multiple bone lesions, lytic, up to 3.9 cm (series 2, image 92), stable on axial imaging but increased  on coronal imaging, as below. 4. Right pleural effusion, present. 5. Pre/paratracheal lymph nodes, present (image 23). 6. Left lower lobe pulmonary nodules, stable (image 38). FINDINGS: CT CHEST FINDINGS Mediastinum/Nodes: Mediastinal lymph nodes measure up to 8 mm in the lower right paratracheal station (series 2, image 23), stable. No left hilar or axillary adenopathy. Soft tissue thickening in the right hilum, stable. Three-vessel coronary artery calcification. Heart size normal. No pericardial effusion. Lungs/Pleura: A small amount of loculated pleural fluid is seen in the upper and lower aspects of the right hemi thorax, stable. Heterogeneous masslike consolidation in the right upper lobe appears larger, however there is surrounding collapse/ consolidation. Approximate measurement is 5.1 cm in longest transverse dimension (series 4, image 20), previously 3.8 cm. Posttreatment changes are seen in the surrounding right lung. Septal thickening, ground-glass and subpleural consolidation in the right lower lobe, as before. Centrilobular emphysema. Minimal peribronchovascular  ground-glass nodularity in the left lower lobe (image 37), stable. Musculoskeletal: Question slight lytic destruction of the right scapula, at the glenoid (series 4, image 11). Mildly sclerotic lesion in the T11 vertebral body is unchanged. Pretracheal nodule measures 1.5 cm (image 7), decreased from 1.7 cm. CT ABDOMEN PELVIS FINDINGS Hepatobiliary: The liver margin is irregular. Liver and gallbladder are otherwise unremarkable. Extrahepatic bile duct measures up to 13 mm stable. Pancreas: Negative. Spleen: Negative. Adrenals/Urinary Tract: Adrenal glands are unremarkable. 7 mm low-attenuation lesion in the right kidney is too small to characterize but stable and statistically, likely represents a cyst. Kidneys are otherwise unremarkable. Ureters are decompressed. Bladder is grossly unremarkable. Stomach/Bowel: Stomach, small bowel, appendix and colon are unremarkable. Vascular/Lymphatic: Atherosclerotic calcification of the arterial vasculature without abdominal aortic aneurysm. Recanalized paraumbilical vein. No pathologically enlarged lymph nodes. Reproductive: Prostate is mildly enlarged. Other: Small right inguinal hernia contains fat. No free fluid. Tiny periumbilical hernia contains fat. Mesenteries and peritoneum are unremarkable. Musculoskeletal: Lytic lesion in the right iliac wing measures up to 4.2 cm (coronal image 61), increased from 3.5 cm. A lytic in the adjacent right iliac wing has enlarged, with a larger soft tissue component, measuring 1.1 x 1.7 cm (Series 2, image 92), previously 0.9 x 1.2 cm. IMPRESSION: 1. Right upper lobe mass is difficult to accurately measure but appears larger than on 10/06/2015, worrisome for disease progression. 2. Progression of osseous metastatic disease with a suspected new lesion in the right glenoid and enlarging lesions in the right iliac wing. 3. Three-vessel coronary artery calcification. 4. Cirrhosis. 5. Enlarged prostate. Electronically Signed   By: Lorin Picket M.D.   On: 12/01/2015 09:36   Mr Brain Wo Contrast  12/01/2015  CLINICAL DATA:  Acute onset of aphasia beginning at 2:30 a.m. today. Speech has since returned. The examination had to be discontinued prior to completion due to the patient becoming combative and refusing further imaging. EXAM: MRI HEAD WITHOUT CONTRAST TECHNIQUE: Multiplanar, multiecho pulse sequences of the brain and surrounding structures were obtained without intravenous contrast. COMPARISON:  CT head without contrast 12/01/2015. FINDINGS: The diffusion-weighted images demonstrate no evidence for acute or subacute infarction. Moderate atrophy and white matter disease is similar to the prior exam. No acute hemorrhage or mass lesion is present. The ventricles are proportionate to the degree of atrophy. The internal auditory canals are within normal limits. The brainstem and cerebellum are normal. Flow is present in the major intracranial arteries. A right lens replacement is present. The globes and orbits are otherwise intact. The paranasal sinuses and mastoid air cells are clear. Midline sagittal  images are within normal limits. IMPRESSION: 1. No acute infarct. 2. Stable atrophy and white matter disease. This likely reflects the sequela of chronic microvascular ischemia. Electronically Signed   By: San Morelle M.D.   On: 12/01/2015 18:20   Ct Abdomen Pelvis W Contrast  12/01/2015  CLINICAL DATA:  Metastatic lung cancer, ongoing chemotherapy. EXAM: CT CHEST, ABDOMEN, AND PELVIS WITH CONTRAST TECHNIQUE: Multidetector CT imaging of the chest, abdomen and pelvis was performed following the standard protocol during bolus administration of intravenous contrast. CONTRAST:  190m OMNIPAQUE IOHEXOL 300 MG/ML  SOLN COMPARISON:  10/06/2015. RECIST 1.1 Target Lesions: 1. Right upper lobe mass, approximately 5.1 cm (series 4, image 20), difficult to measure due to heterogeneity and surrounding collapse/ consolidation, as well is pleural  fluid. 2. Non-target Lesions: 1. AP window lymph node, 9 mm (image 23), previously 11 mm. 2. Subcarinal lymph node, present. 3. Multiple bone lesions, lytic, up to 3.9 cm (series 2, image 92), stable on axial imaging but increased on coronal imaging, as below. 4. Right pleural effusion, present. 5. Pre/paratracheal lymph nodes, present (image 23). 6. Left lower lobe pulmonary nodules, stable (image 38). FINDINGS: CT CHEST FINDINGS Mediastinum/Nodes: Mediastinal lymph nodes measure up to 8 mm in the lower right paratracheal station (series 2, image 23), stable. No left hilar or axillary adenopathy. Soft tissue thickening in the right hilum, stable. Three-vessel coronary artery calcification. Heart size normal. No pericardial effusion. Lungs/Pleura: A small amount of loculated pleural fluid is seen in the upper and lower aspects of the right hemi thorax, stable. Heterogeneous masslike consolidation in the right upper lobe appears larger, however there is surrounding collapse/ consolidation. Approximate measurement is 5.1 cm in longest transverse dimension (series 4, image 20), previously 3.8 cm. Posttreatment changes are seen in the surrounding right lung. Septal thickening, ground-glass and subpleural consolidation in the right lower lobe, as before. Centrilobular emphysema. Minimal peribronchovascular ground-glass nodularity in the left lower lobe (image 37), stable. Musculoskeletal: Question slight lytic destruction of the right scapula, at the glenoid (series 4, image 11). Mildly sclerotic lesion in the T11 vertebral body is unchanged. Pretracheal nodule measures 1.5 cm (image 7), decreased from 1.7 cm. CT ABDOMEN PELVIS FINDINGS Hepatobiliary: The liver margin is irregular. Liver and gallbladder are otherwise unremarkable. Extrahepatic bile duct measures up to 13 mm stable. Pancreas: Negative. Spleen: Negative. Adrenals/Urinary Tract: Adrenal glands are unremarkable. 7 mm low-attenuation lesion in the right  kidney is too small to characterize but stable and statistically, likely represents a cyst. Kidneys are otherwise unremarkable. Ureters are decompressed. Bladder is grossly unremarkable. Stomach/Bowel: Stomach, small bowel, appendix and colon are unremarkable. Vascular/Lymphatic: Atherosclerotic calcification of the arterial vasculature without abdominal aortic aneurysm. Recanalized paraumbilical vein. No pathologically enlarged lymph nodes. Reproductive: Prostate is mildly enlarged. Other: Small right inguinal hernia contains fat. No free fluid. Tiny periumbilical hernia contains fat. Mesenteries and peritoneum are unremarkable. Musculoskeletal: Lytic lesion in the right iliac wing measures up to 4.2 cm (coronal image 61), increased from 3.5 cm. A lytic in the adjacent right iliac wing has enlarged, with a larger soft tissue component, measuring 1.1 x 1.7 cm (Series 2, image 92), previously 0.9 x 1.2 cm. IMPRESSION: 1. Right upper lobe mass is difficult to accurately measure but appears larger than on 10/06/2015, worrisome for disease progression. 2. Progression of osseous metastatic disease with a suspected new lesion in the right glenoid and enlarging lesions in the right iliac wing. 3. Three-vessel coronary artery calcification. 4. Cirrhosis. 5. Enlarged prostate. Electronically Signed  By: Lorin Picket M.D.   On: 12/01/2015 09:36    Chart has been reviewed  Family   at  Bedside  plan of care was discussed with  Wife Joy [phone]  Assessment/Plan  80 yo M with  hx of stage 4 lung CA with mets to the bone (right shoulder) presents with confusion and dehydration.   Present on Admission:  . Acute encephalopathy; patient may have aphasia initially but currently this is resolved possible that the patient was secondary to confusion rather than isolated.  patient is neurologically intact. He still continues to be somewhat confused but able to speak clearly. The confusion seems to be improving with  gentle rehydration will continue. Suspect dehydration being the most likely culprit. Calcium level within normal limits MRI showing no evidence of intracranial metastatic disease and/or stroke. Neurology is aware recommend acute encephalopathy workup. Case also has been discussed with oncology who will see patient in consult tomorrow but would like to evaluate for any pituitary hormonal abnormalities. Per neurology recommendations will order EEG. TIAs somewhat less likely given prolonged event and no localizing neurological deficits  . Hypertension chronic stable continue to monitor closely  . Dehydration examination had had decreased by mouth intake will rehydrate and see if his mental status is improved  . Cancer of upper lobe of right lung ([place]) we'll discuss with oncology to see if any of his immunotherapy could have contributed to confusion  . Hypokalemia replace and check magnesium level  . Bone metastasis ([facility]) chronic no evidence of calcium elevation could explain acute encephalopathy     Prophylaxis: SCD   CODE STATUS:  FULL CODE   as per family  Disposition:  To home once workup is complete and patient is stable  Other plan as per orders.  I have spent a total of 65 min on this admission extra time was taken to discuss case with oncology Dr. Pricilla Larsson 12/01/2015, 9:44 PM  [facility]  Pager [phone]   after 2 AM please page floor coverage PA If 7AM-7PM, please contact the day team taking care of the patient  [email or web address]  Password [email or web address]

## 2015-12-01 NOTE — ED Notes (Addendum)
Pt wife states this morning patient woke up speaking non-sense at 0230 this am. Martin Majestic to his scheduled CT scan this morning and was able to follow instructions, but unable to speak appropriately. PCP recommended he come here. Pt unable to tell me his birthday or his wife's name. Unable to speak in complete sentences or give correct short answers. Does not appear to be in any pain, but understands some commands. Wife says he was unable to find or make it to the bathroom while in the waiting room here, which is also very unlike him.

## 2015-12-01 NOTE — Telephone Encounter (Signed)
Patient's wife, Caryl Asp, called me to inform me that patient is having "confusion." Wife states that he has been confused since awake this morning at 2:30, patient had a scheduled restaging CT scan @ 0830 and had contrast to drink. Wife reports that patient was able to give his name and birthday this morning for his CT, but continues to have "confusion". Wife reports that patient was confused "a night or two ago." She reports that his B/P this morning measured by home machine was 184/90. She denies that patient has/had fever, patient has not complained of pain, or had unusual SOB. When I asked if patient is complaining of headache, she states she hasn't asked him and he hasn't said anything about it. She does report that he hasn't eaten much today. Patient is diabetic, asked wife if she knows what his blood sugar is, she reports that they did not measure it today. I encouraged spouse to take patient to ED for evaluation, spouse requested to bring patient here to symptom mgmt for eval. I reviewed with Dr. Julien Nordmann patient's symptoms and informed spouse that patient needs be taken to ED for full evaluation.  Patient is on BMS370 Study, completed Cycle 13 of Nivolumab treatment. Confirmed with spouse to bring with them to ED study provided information wallet card.  Spouse confirmed she will take patient to [facility] ED as soon as we finished on phone.  Adele Dan, RN, BSN Clinical Research 12/01/2015 2:52 PM

## 2015-12-01 NOTE — ED Provider Notes (Signed)
CSN: [id number]     Arrival date & time 12/01/15  1528 History   First MD Initiated Contact with Patient 12/01/15 1552     Chief Complaint  Patient presents with  . Stroke Symptoms  . Altered Mental Status     (Consider location/radiation/quality/duration/timing/severity/associated sxs/prior Treatment) Patient is a 80 y.o. male presenting with altered mental status. The history is provided by the spouse. The history is limited by the condition of the patient.  Altered Mental Status   LEVEL 5 CAVEAT  Omar Peters is a 80 y.o. male with PMH significant for HTN, GERD, Stage IV Lung Cancer who presents with expressive aphasia since 2:30 AM this morning.  Patient's wife reports that he has been acting "confused" since Monday, and this this morning began having difficulty expressing words, but he has been able to follow commands.  He was able to go to his CT chest/abdomen/pelvis this afternoon to evaluate for metastasis.  Wife spoke with his PCP about his abnormal behavior who recommended they come here.  Wife denies facial droop, gait abnormalities, unilateral weakness, HA, CP, SOB, abdominal pain, or N/V.    Past Medical History  Diagnosis Date  . Hypertension   . GERD (gastroesophageal reflux disease)   . Shortness of breath dyspnea     01/14/15 at night and has to sit up  . Anxiety     due to diagnosis of Stage 4 Lung Cancer  . Macular degeneration of right eye   . Cancer ([place])     Lung  . Diabetes mellitus without complication [facility])    Past Surgical History  Procedure Laterality Date  . Vein ligation and stripping  1977  . Chest tube insertion Right 01/15/2015    Procedure: INSERTION PLEURAL DRAINAGE CATHETER WITH ULTRASOUND AND FLURO GUIDANCE;  Surgeon: Grace Isaac, MD;  Location: [place];  Service: Thoracic;  Laterality: Right;  . Removal of pleural drainage catheter Right 03/26/2015    Procedure: REMOVAL OF PLEURAL DRAINAGE CATHETER;  Surgeon: Grace Isaac, MD;   Location: [place];  Service: Thoracic;  Laterality: Right;   Family History  Problem Relation Age of Onset  . Stroke Mother   . Cancer Father   . Cancer Brother    Social History  Substance Use Topics  . Smoking status: Former Smoker -- 1.00 packs/day for 50 years    Quit date: 12/14/2014  . Smokeless tobacco: Former Systems developer    Quit date: 12/14/2014     Comment: 4-5 cig day  . Alcohol Use: 4.2 oz/week    7 Shots of liquor per week     Comment: 2-3  beers day    Review of Systems  Unable to perform ROS: Mental status change      Allergies  Amitriptyline and Marinol  Home Medications   Prior to Admission medications   Medication Sig Start Date End Date Taking? Authorizing Provider  amitriptyline (ELAVIL) 10 MG tablet Take 10 mg by mouth at bedtime.  08/19/15  Yes Historical Provider, MD  aspirin 81 MG tablet Take 81 mg by mouth daily.   Yes Historical Provider, MD  B Complex-Biotin-FA (B-COMPLEX PO) Take 1 each by mouth daily.   Yes Historical Provider, MD  metFORMIN (GLUCOPHAGE-XR) 500 MG 24 hr tablet Take 500 mg by mouth daily with breakfast.   Yes Historical Provider, MD  ranitidine (ZANTAC) 150 MG tablet Take 150 mg by mouth daily.   Yes Historical Provider, MD  senna-docusate (SENNA S) 8.6-50 MG per tablet  Take 2 tablets by mouth daily.   Yes Historical Provider, MD  HYDROcodone-acetaminophen (NORCO) 5-325 MG per tablet Take 1 tablet by mouth every 6 (six) hours as needed for moderate pain. 01/15/15   Grace Isaac, MD  prochlorperazine (COMPAZINE) 10 MG tablet TAKE ONE TABLET BY MOUTH EVERY 6 HOURS AS NEEDED FOR NAUSEA AND VOMITING 09/01/15   Adrena E Johnson, PA-C   BP 116/72 mmHg  Pulse 100  Temp(Src) 97.7 F (36.5 C) (Oral)  Resp 20  SpO2 94% Physical Exam  Constitutional: He appears well-developed and well-nourished.  HENT:  Head: Normocephalic and atraumatic.  Mouth/Throat: Oropharynx is clear and moist.  Eyes: Conjunctivae are normal. Pupils are equal, round,  and reactive to light.  Neck: Normal range of motion. Neck supple.  Cardiovascular: Regular rhythm and normal heart sounds.  Tachycardia present.   No murmur heard. Pulmonary/Chest: Effort normal and breath sounds normal. No accessory muscle usage or stridor. No respiratory distress. He has no wheezes. He has no rhonchi. He has no rales.  Abdominal: Soft. Bowel sounds are normal. He exhibits no distension. There is no tenderness.  Musculoskeletal: Normal range of motion.  Lymphadenopathy:    He has no cervical adenopathy.  Neurological: He is alert. GCS eye subscore is 4. GCS verbal subscore is 3. GCS motor subscore is 6.  Patient responds to commands with inappropriate words and speech.  Patient is able to acknowledge verbal commands with a verbal response, but has difficulty performing commands.  No pronator drift.  Cranial nerves grossly intact.  Strength and sensation grossly intact bilaterally throughout upper and lower extremities.   Skin: Skin is warm and dry.  Psychiatric: He has a normal mood and affect. His behavior is normal.    ED Course  Procedures (including critical care time) Labs Review Labs Reviewed  APTT - Abnormal; Notable for the following:    aPTT 38 (*)    All other components within normal limits  COMPREHENSIVE METABOLIC PANEL - Abnormal; Notable for the following:    Potassium 3.3 (*)    Chloride 98 (*)    Glucose, Bld 123 (*)    All other components within normal limits  CBG MONITORING, ED - Abnormal; Notable for the following:    Glucose-Capillary 118 (*)    All other components within normal limits  I-STAT CHEM 8, ED - Abnormal; Notable for the following:    Potassium 3.3 (*)    Chloride 96 (*)    Glucose, Bld 118 (*)    All other components within normal limits  PROTIME-INR  CBC  DIFFERENTIAL  URINALYSIS, ROUTINE W REFLEX MICROSCOPIC (NOT AT [facility])  I-STAT TROPOININ, ED    Imaging Review Ct Head Wo Contrast  12/01/2015  CLINICAL DATA:  Was  speaking non since after waking up at 0230 hours today, unable to speak appropriately, stroke symptoms, history hypertension, diabetes mellitus, former smoker, RIGHT upper lobe lung cancer EXAM: CT HEAD WITHOUT CONTRAST TECHNIQUE: Contiguous axial images were obtained from the base of the skull through the vertex without intravenous contrast. COMPARISON:  None; correlation MR brain 04/07/2015 FINDINGS: Generalized atrophy. Normal ventricular morphology. No midline shift or mass effect. Small vessel chronic ischemic changes of deep cerebral white matter. No intracranial hemorrhage, mass lesion, or acute infarction. Visualized paranasal sinuses and mastoid air cells clear. Bones unremarkable. Atherosclerotic calcification of internal carotid and vertebral arteries at skullbase. IMPRESSION: Atrophy with small vessel chronic ischemic changes of deep cerebral white matter. No acute intracranial abnormalities. Electronically Signed  By: Lavonia Dana M.D.   On: 12/01/2015 16:36   Ct Chest W Contrast  12/01/2015  CLINICAL DATA:  Metastatic lung cancer, ongoing chemotherapy. EXAM: CT CHEST, ABDOMEN, AND PELVIS WITH CONTRAST TECHNIQUE: Multidetector CT imaging of the chest, abdomen and pelvis was performed following the standard protocol during bolus administration of intravenous contrast. CONTRAST:  151m OMNIPAQUE IOHEXOL 300 MG/ML  SOLN COMPARISON:  10/06/2015. RECIST 1.1 Target Lesions: 1. Right upper lobe mass, approximately 5.1 cm (series 4, image 20), difficult to measure due to heterogeneity and surrounding collapse/ consolidation, as well is pleural fluid. 2. Non-target Lesions: 1. AP window lymph node, 9 mm (image 23), previously 11 mm. 2. Subcarinal lymph node, present. 3. Multiple bone lesions, lytic, up to 3.9 cm (series 2, image 92), stable on axial imaging but increased on coronal imaging, as below. 4. Right pleural effusion, present. 5. Pre/paratracheal lymph nodes, present (image 23). 6. Left lower lobe  pulmonary nodules, stable (image 38). FINDINGS: CT CHEST FINDINGS Mediastinum/Nodes: Mediastinal lymph nodes measure up to 8 mm in the lower right paratracheal station (series 2, image 23), stable. No left hilar or axillary adenopathy. Soft tissue thickening in the right hilum, stable. Three-vessel coronary artery calcification. Heart size normal. No pericardial effusion. Lungs/Pleura: A small amount of loculated pleural fluid is seen in the upper and lower aspects of the right hemi thorax, stable. Heterogeneous masslike consolidation in the right upper lobe appears larger, however there is surrounding collapse/ consolidation. Approximate measurement is 5.1 cm in longest transverse dimension (series 4, image 20), previously 3.8 cm. Posttreatment changes are seen in the surrounding right lung. Septal thickening, ground-glass and subpleural consolidation in the right lower lobe, as before. Centrilobular emphysema. Minimal peribronchovascular ground-glass nodularity in the left lower lobe (image 37), stable. Musculoskeletal: Question slight lytic destruction of the right scapula, at the glenoid (series 4, image 11). Mildly sclerotic lesion in the T11 vertebral body is unchanged. Pretracheal nodule measures 1.5 cm (image 7), decreased from 1.7 cm. CT ABDOMEN PELVIS FINDINGS Hepatobiliary: The liver margin is irregular. Liver and gallbladder are otherwise unremarkable. Extrahepatic bile duct measures up to 13 mm stable. Pancreas: Negative. Spleen: Negative. Adrenals/Urinary Tract: Adrenal glands are unremarkable. 7 mm low-attenuation lesion in the right kidney is too small to characterize but stable and statistically, likely represents a cyst. Kidneys are otherwise unremarkable. Ureters are decompressed. Bladder is grossly unremarkable. Stomach/Bowel: Stomach, small bowel, appendix and colon are unremarkable. Vascular/Lymphatic: Atherosclerotic calcification of the arterial vasculature without abdominal aortic aneurysm.  Recanalized paraumbilical vein. No pathologically enlarged lymph nodes. Reproductive: Prostate is mildly enlarged. Other: Small right inguinal hernia contains fat. No free fluid. Tiny periumbilical hernia contains fat. Mesenteries and peritoneum are unremarkable. Musculoskeletal: Lytic lesion in the right iliac wing measures up to 4.2 cm (coronal image 61), increased from 3.5 cm. A lytic in the adjacent right iliac wing has enlarged, with a larger soft tissue component, measuring 1.1 x 1.7 cm (Series 2, image 92), previously 0.9 x 1.2 cm. IMPRESSION: 1. Right upper lobe mass is difficult to accurately measure but appears larger than on 10/06/2015, worrisome for disease progression. 2. Progression of osseous metastatic disease with a suspected new lesion in the right glenoid and enlarging lesions in the right iliac wing. 3. Three-vessel coronary artery calcification. 4. Cirrhosis. 5. Enlarged prostate. Electronically Signed   By: MLorin PicketM.D.   On: 12/01/2015 09:36   Mr Brain Wo Contrast  12/01/2015  CLINICAL DATA:  Acute onset of aphasia beginning at 2:30 a.m.  today. Speech has since returned. The examination had to be discontinued prior to completion due to the patient becoming combative and refusing further imaging. EXAM: MRI HEAD WITHOUT CONTRAST TECHNIQUE: Multiplanar, multiecho pulse sequences of the brain and surrounding structures were obtained without intravenous contrast. COMPARISON:  CT head without contrast 12/01/2015. FINDINGS: The diffusion-weighted images demonstrate no evidence for acute or subacute infarction. Moderate atrophy and white matter disease is similar to the prior exam. No acute hemorrhage or mass lesion is present. The ventricles are proportionate to the degree of atrophy. The internal auditory canals are within normal limits. The brainstem and cerebellum are normal. Flow is present in the major intracranial arteries. A right lens replacement is present. The globes and orbits  are otherwise intact. The paranasal sinuses and mastoid air cells are clear. Midline sagittal images are within normal limits. IMPRESSION: 1. No acute infarct. 2. Stable atrophy and white matter disease. This likely reflects the sequela of chronic microvascular ischemia. Electronically Signed   By: San Morelle M.D.   On: 12/01/2015 18:20   Ct Abdomen Pelvis W Contrast  12/01/2015  CLINICAL DATA:  Metastatic lung cancer, ongoing chemotherapy. EXAM: CT CHEST, ABDOMEN, AND PELVIS WITH CONTRAST TECHNIQUE: Multidetector CT imaging of the chest, abdomen and pelvis was performed following the standard protocol during bolus administration of intravenous contrast. CONTRAST:  136m OMNIPAQUE IOHEXOL 300 MG/ML  SOLN COMPARISON:  10/06/2015. RECIST 1.1 Target Lesions: 1. Right upper lobe mass, approximately 5.1 cm (series 4, image 20), difficult to measure due to heterogeneity and surrounding collapse/ consolidation, as well is pleural fluid. 2. Non-target Lesions: 1. AP window lymph node, 9 mm (image 23), previously 11 mm. 2. Subcarinal lymph node, present. 3. Multiple bone lesions, lytic, up to 3.9 cm (series 2, image 92), stable on axial imaging but increased on coronal imaging, as below. 4. Right pleural effusion, present. 5. Pre/paratracheal lymph nodes, present (image 23). 6. Left lower lobe pulmonary nodules, stable (image 38). FINDINGS: CT CHEST FINDINGS Mediastinum/Nodes: Mediastinal lymph nodes measure up to 8 mm in the lower right paratracheal station (series 2, image 23), stable. No left hilar or axillary adenopathy. Soft tissue thickening in the right hilum, stable. Three-vessel coronary artery calcification. Heart size normal. No pericardial effusion. Lungs/Pleura: A small amount of loculated pleural fluid is seen in the upper and lower aspects of the right hemi thorax, stable. Heterogeneous masslike consolidation in the right upper lobe appears larger, however there is surrounding collapse/  consolidation. Approximate measurement is 5.1 cm in longest transverse dimension (series 4, image 20), previously 3.8 cm. Posttreatment changes are seen in the surrounding right lung. Septal thickening, ground-glass and subpleural consolidation in the right lower lobe, as before. Centrilobular emphysema. Minimal peribronchovascular ground-glass nodularity in the left lower lobe (image 37), stable. Musculoskeletal: Question slight lytic destruction of the right scapula, at the glenoid (series 4, image 11). Mildly sclerotic lesion in the T11 vertebral body is unchanged. Pretracheal nodule measures 1.5 cm (image 7), decreased from 1.7 cm. CT ABDOMEN PELVIS FINDINGS Hepatobiliary: The liver margin is irregular. Liver and gallbladder are otherwise unremarkable. Extrahepatic bile duct measures up to 13 mm stable. Pancreas: Negative. Spleen: Negative. Adrenals/Urinary Tract: Adrenal glands are unremarkable. 7 mm low-attenuation lesion in the right kidney is too small to characterize but stable and statistically, likely represents a cyst. Kidneys are otherwise unremarkable. Ureters are decompressed. Bladder is grossly unremarkable. Stomach/Bowel: Stomach, small bowel, appendix and colon are unremarkable. Vascular/Lymphatic: Atherosclerotic calcification of the arterial vasculature without abdominal aortic aneurysm. Recanalized  paraumbilical vein. No pathologically enlarged lymph nodes. Reproductive: Prostate is mildly enlarged. Other: Small right inguinal hernia contains fat. No free fluid. Tiny periumbilical hernia contains fat. Mesenteries and peritoneum are unremarkable. Musculoskeletal: Lytic lesion in the right iliac wing measures up to 4.2 cm (coronal image 61), increased from 3.5 cm. A lytic in the adjacent right iliac wing has enlarged, with a larger soft tissue component, measuring 1.1 x 1.7 cm (Series 2, image 92), previously 0.9 x 1.2 cm. IMPRESSION: 1. Right upper lobe mass is difficult to accurately measure but  appears larger than on 10/06/2015, worrisome for disease progression. 2. Progression of osseous metastatic disease with a suspected new lesion in the right glenoid and enlarging lesions in the right iliac wing. 3. Three-vessel coronary artery calcification. 4. Cirrhosis. 5. Enlarged prostate. Electronically Signed   By: Lorin Picket M.D.   On: 12/01/2015 09:36   I have personally reviewed and evaluated these images and lab results as part of my medical decision-making.   EKG Interpretation None      MDM   Final diagnoses:  Altered mental status, unspecified altered mental status type    Patient presents with expressive aphasia that began approximately 2:30 this morning.  VS show tachycardia with HR 110, RR 20 without hypoxia, BP 165/101, and he is afebrile.  On exam, lungs CTAB, abdomen soft and benign.  He is unable to verbalize appropriate responses; however it is able to somewhat follow commands.  Will initiate stroke workup.  Labs unremarkable. CT head shows atrophy with small vessel chronic ischemic changes of deep cerebral white matter.  No acute intracranial abnormalities. Will consult neurology.  Dr. Leonel Ramsay will see him.  Will also, obtain brain MR with and w/out contrast.  Per Dr. Leonel Ramsay, high suspicion for stroke.  Pending MR results will admit to [facility] for further evaluation.  MR reveals no acute infarct.  Stable atrophy and white matter disease likely reflecting sequela of chronic microvascular ischemia.  Will admit to medicine for further evaluation of AMS.  Case has been discussed with and seen by Dr. Billy Fischer who agrees with the above plan for admission.   Gloriann Loan, PA-C 12/01/15 2210  Gareth Morgan, MD 12/05/15 [phone]

## 2015-12-01 NOTE — Consult Note (Signed)
Neurology Consultation Reason for Consult: Aphasia Referring Physician: Billy Fischer,   CC: Aphasia  History is obtained from:patient  HPI: Omar Peters is a 80 y.o. male with a history of stage IV NSCLC who presents with aphasia that started sometime overnight. His wife states that he has been feeling just in general run down over the past couple of weeks, however in the middle the night he became quite agitated because he was trying to say the name is something that he was looking for, but was unable to find a word. He continued to have difficulty with speech and they came in for his CT scans earlier today. After being seen, he was sent to the emergency room for further evaluation.  In the ER, he had a CT scan which doesn't show any obvious cerebral metastasis. He continues to have trouble with his speech, however and therefore neurology was consulted.  Per his wife, he has had significant improvement over the past few hours.   LKW: 12/13 prior to bed tpa given?: no, out of window    ROS: Unable to obtain due to altered mental status.   Past Medical History  Diagnosis Date  . Hypertension   . GERD (gastroesophageal reflux disease)   . Shortness of breath dyspnea     01/14/15 at night and has to sit up  . Anxiety     due to diagnosis of Stage 4 Lung Cancer  . Macular degeneration of right eye   . Cancer ([place])     Lung  . Diabetes mellitus without complication ([place])      Family History  Problem Relation Age of Onset  . Stroke Mother   . Cancer Father   . Cancer Brother      Social History:  reports that he quit smoking about a year ago. He quit smokeless tobacco use about a year ago. He reports that he drinks about 4.2 oz of alcohol per week. He reports that he does not use illicit drugs.   Exam: Current vital signs: BP 144/84 mmHg  Pulse 107  Temp(Src) 98.4 F (36.9 C) (Oral)  Resp 18  SpO2 97% Vital signs in last 24 hours: Temp:  [98.4 F (36.9 C)] 98.4 F  (36.9 C) (12/14 1535) Pulse Rate:  [105-110] 107 (12/14 1715) Resp:  [18-20] 18 (12/14 1715) BP: (144-166)/(84-101) 144/84 mmHg (12/14 1715) SpO2:  [95 %-97 %] 97 % (12/14 1715)  Physical Exam  Constitutional: Appears well-developed and well-nourished.  Psych: Affect appropriate to situation Eyes: No scleral injection HENT: No OP obstrucion Head: Normocephalic.  Cardiovascular: Normal rate and regular rhythm.  Respiratory: Effort normal  GI: Soft.  No distension. There is no tenderness.  Skin: WDI  Neuro: Mental Status: Patient is awake, alert, he has a moderate receptive aphasia, with difficulty following anything more than simple commands as well as making frequent word substitutions. He also has some perseveration.  Cranial Nerves: II: Visual Fields are full. Pupils are equal, round, and reactive to light.   III,IV, VI: EOMI without ptosis or diploplia.  V: Facial sensation is symmetric to temperature VII: Facial movement is notable for mild right facial weakness VIII: hearing is intact to voice X: Uvula elevates symmetrically XI: Shoulder shrug is symmetric. XII: tongue is midline without atrophy or fasciculations.  Motor: Tone is normal. Bulk is normal. 5/5 strength was present in all four extremities.  Sensory: Sensation is symmetric to light touch and temperature in the arms and legs. Cerebellar: No clear ataxia  I have reviewed labs in epic and the results pertinent to this consultation are: Chem 8 - unremarkable  I have reviewed the images obtained: CT head - no clear findings.   Impression: 80 yo male with new onset aphasia. The relatively sudden onset was concerning for acute stroke. No metastasis was seen on CT, however with metastasis sometimes postictally patients will take a prolonged time to return to baseline, and it is possible that this is the case. I suspect, however, that this is rather ischemic.  Recommendations: 1) MRI brain- if positive for  stroke, then pursue stroke workup 2) if negative, would pursue EEG and depending on clinical status will pursue other studies as well.  Roland Rack, MD [facility] [phone]  If 7pm- 7am, please page neurology on call as listed in [place].

## 2015-12-01 NOTE — ED Notes (Signed)
Pt gone to MRI and family in room. Pocket watch removed and given family member. Offered coffee and beverage to family member. Waiting to return.

## 2015-12-01 NOTE — ED Notes (Signed)
Pt currently in radiology.

## 2015-12-01 NOTE — ED Notes (Signed)
Pt able ok to drink slip of water and chew and swallow cracker

## 2015-12-02 ENCOUNTER — Observation Stay (HOSPITAL_COMMUNITY)
Admission: RE | Admit: 2015-12-02 | Discharge: 2015-12-02 | Disposition: A | Discharge status: Home / Self Care | Payer: Medicare Other | Source: Ambulatory Visit | Attending: Internal Medicine | Admitting: Internal Medicine

## 2015-12-02 ENCOUNTER — Encounter: Payer: Self-pay | Admitting: *Deleted

## 2015-12-02 DIAGNOSIS — E860 Dehydration: Secondary | ICD-10-CM

## 2015-12-02 DIAGNOSIS — C7951 Secondary malignant neoplasm of bone: Secondary | ICD-10-CM

## 2015-12-02 DIAGNOSIS — G9340 Encephalopathy, unspecified: Secondary | ICD-10-CM

## 2015-12-02 DIAGNOSIS — R4701 Aphasia: Secondary | ICD-10-CM | POA: Diagnosis not present

## 2015-12-02 DIAGNOSIS — C3411 Malignant neoplasm of upper lobe, right bronchus or lung: Secondary | ICD-10-CM | POA: Diagnosis not present

## 2015-12-02 DIAGNOSIS — E876 Hypokalemia: Secondary | ICD-10-CM

## 2015-12-02 DIAGNOSIS — I10 Essential (primary) hypertension: Secondary | ICD-10-CM | POA: Diagnosis not present

## 2015-12-02 DIAGNOSIS — G9341 Metabolic encephalopathy: Secondary | ICD-10-CM | POA: Diagnosis not present

## 2015-12-02 LAB — COMPREHENSIVE METABOLIC PANEL
ALBUMIN: 3 g/dL — AB (ref 3.5–5.0)
ALT: 16 U/L — ABNORMAL LOW (ref 17–63)
ANION GAP: 7 (ref 5–15)
AST: 18 U/L (ref 15–41)
Alkaline Phosphatase: 71 U/L (ref 38–126)
BUN: 8 mg/dL (ref 6–20)
CO2: 28 mmol/L (ref 22–32)
Calcium: 8.8 mg/dL — ABNORMAL LOW (ref 8.9–10.3)
Chloride: 105 mmol/L (ref 101–111)
Creatinine, Ser: 0.76 mg/dL (ref 0.61–1.24)
GFR calc non Af Amer: >60 mL/min (ref >60)
GLUCOSE: 120 mg/dL — AB (ref 65–99)
POTASSIUM: 3.4 mmol/L — AB (ref 3.5–5.1)
SODIUM: 140 mmol/L (ref 135–145)
TOTAL PROTEIN: 6.3 g/dL — AB (ref 6.5–8.1)
Total Bilirubin: 0.7 mg/dL (ref 0.3–1.2)

## 2015-12-02 LAB — GLUCOSE, CAPILLARY
Glucose-Capillary: 186 mg/dL — ABNORMAL HIGH (ref 65–99)
Glucose-Capillary: 110 mg/dL — ABNORMAL HIGH (ref 65–99)
Glucose-Capillary: 172 mg/dL — ABNORMAL HIGH (ref 65–99)

## 2015-12-02 LAB — CORTISOL-AM, BLOOD: CORTISOL - AM: 6 ug/dL — AB (ref 6.7–22.6)

## 2015-12-02 LAB — CBC
HEMATOCRIT: 38 % — AB (ref 39.0–52.0)
HEMOGLOBIN: 12.8 g/dL — AB (ref 13.0–17.0)
MCH: 32.9 pg (ref 26.0–34.0)
MCHC: 33.7 g/dL (ref 30.0–36.0)
MCV: 97.7 fL (ref 78.0–100.0)
Platelets: 197 10*3/uL (ref 150–400)
RBC: 3.89 MIL/uL — AB (ref 4.22–5.81)
RDW: 13.1 % (ref 11.5–15.5)
WBC: 6.9 10*3/uL (ref 4.0–10.5)

## 2015-12-02 LAB — MAGNESIUM: MAGNESIUM: 1.9 mg/dL (ref 1.7–2.4)

## 2015-12-02 LAB — PHOSPHORUS: PHOSPHORUS: 2.9 mg/dL (ref 2.5–4.6)

## 2015-12-02 LAB — ACTH STIMULATION, 3 TIME POINTS
CORTISOL 30 MIN: 23.2 ug/dL
Cortisol, 60 Min: 27.3 ug/dL
Cortisol, Base: 6 ug/dL

## 2015-12-02 LAB — LACTIC ACID, PLASMA
Lactic Acid, Venous: 0.6 mmol/L (ref 0.5–2.0)
Lactic Acid, Venous: 0.9 mmol/L (ref 0.5–2.0)

## 2015-12-02 LAB — AMMONIA: AMMONIA: 36 umol/L — AB (ref 9–35)

## 2015-12-02 LAB — TSH: TSH: 3.514 u[IU]/mL (ref 0.350–4.500)

## 2015-12-02 LAB — TROPONIN I: Troponin I: <0.03 ng/mL (ref <0.031)

## 2015-12-02 MED ORDER — VITAMIN B-1 100 MG PO TABS
100.0000 mg | ORAL_TABLET | Freq: Every day | ORAL | Status: DC
  Administered 2015-12-02: 100 mg via ORAL
  Filled 2015-12-02: qty 1

## 2015-12-02 MED ORDER — ADULT MULTIVITAMIN W/MINERALS CH
1.0000 | ORAL_TABLET | Freq: Every day | ORAL | Status: DC
  Administered 2015-12-02: 1 via ORAL
  Filled 2015-12-02: qty 1

## 2015-12-02 MED ORDER — COSYNTROPIN 0.25 MG IJ SOLR
0.2500 mg | Freq: Once | INTRAMUSCULAR | Status: AC
  Administered 2015-12-02: 0.25 mg via INTRAVENOUS
  Filled 2015-12-02: qty 0.25

## 2015-12-02 MED ORDER — THIAMINE HCL 100 MG/ML IJ SOLN
100.0000 mg | Freq: Every day | INTRAMUSCULAR | Status: DC
  Filled 2015-12-02: qty 1

## 2015-12-02 MED ORDER — LORAZEPAM 1 MG PO TABS: 0.0000 mg | ORAL_TABLET | Freq: Two times a day (BID) | ORAL | Status: DC

## 2015-12-02 MED ORDER — DEXAMETHASONE 0.5 MG PO TABS
0.5000 mg | ORAL_TABLET | Freq: Every day | ORAL | Status: DC
  Filled 2015-12-02: qty 1

## 2015-12-02 MED ORDER — FOLIC ACID 1 MG PO TABS
1.0000 mg | ORAL_TABLET | Freq: Every day | ORAL | Status: DC
  Administered 2015-12-02: 1 mg via ORAL
  Filled 2015-12-02: qty 1

## 2015-12-02 MED ORDER — LORAZEPAM 1 MG PO TABS: 0.0000 mg | ORAL_TABLET | Freq: Four times a day (QID) | ORAL | Status: DC

## 2015-12-02 MED ORDER — LORAZEPAM 1 MG PO TABS: 1.0000 mg | ORAL_TABLET | Freq: Four times a day (QID) | ORAL | Status: DC | PRN

## 2015-12-02 MED ORDER — DEXAMETHASONE 4 MG PO TABS
4.0000 mg | ORAL_TABLET | Freq: Every day | ORAL | Status: DC
  Filled 2015-12-02: qty 1

## 2015-12-02 MED ORDER — LORAZEPAM 2 MG/ML IJ SOLN: 1.0000 mg | Freq: Four times a day (QID) | INTRAMUSCULAR | Status: DC | PRN

## 2015-12-02 NOTE — Progress Notes (Signed)
12/02/15 at 11:38am - SAE reporting for the BMS 370 study-  The pt was hospitalized for severe altered mental status changes  on 12/01/15.  The research nurse reviewed the pt's records with Dr. Julien Nordmann, study PI, this morning.  Dr. Julien Nordmann also reviewed the latest IB addendum No. 1 dated 09/15/2015.  Dr. Julien Nordmann said that he is not convinced that it is "encephalopathy" because the pt has had past episodes of confusion.  Dr. Julien Nordmann said that it may be related to dehydration.  Dr. Julien Nordmann read through all of the pt's hospital notes.  He said that he wanted to see the pt for himself before he makes a determination about the cause of the pt's confusion and whether is it related to the pt's nivolumab.  He said that he would notify the research nurse about his findings later today.  The research nurse will await Dr. Worthy Flank direction before reporting this SAE.  Brion Aliment RN, BSN, CCRP Clinical Research Nurse 12/02/2015 11:51 AM    12/02/15 at 4:29pm - Dr. Julien Nordmann reviewed the pt's EEG results from this afternoon.  Dr. Julien Nordmann stated that he did not feel that the pt's altered mental status/confusion is related to the pt's nivolumab.  He advised research nurse to enter the pt's hospitalization as a SAE - "grade 3 altered mental status".  The pt is still hospitalized today.  The SAE was entered into the [facility] this afternoon.  Dr. Julien Nordmann signed off the SAE.  The SAE report was printed and given to Jerline Pain, Psychologist, educational, for IRB notification.  Will continue to monitor the pt's hospitalization and update the SAE with any new information.   Brion Aliment RN, BSN Clinical Research Nurse 12/02/2015 4:34 PM   12/03/15 at 8.57am - The pt was discharged from the hospital on 12/02/15 at 5:47 pm.  The Discharge Summary is still pending at this time.  Will await this D/C summary and have Dr. Julien Nordmann review the report before resolving the SAE.  Will obtain all of the pt's hospital medications for  reporting purposes.   Brion Aliment RN, BSN, [place] Clinical Research Nurse 12/03/2015 9:05 AM   12/06/15 at 9:13am - The pt's discharge summary has been finalized.  The discharge summary states, "Thus most likely cause of his presentation was dehydration and he appears to be at his baseline". The research nurse Doristine Johns, RN) gave Otilio Miu, pt's assigned research nurse, a report on the SAE that was submitted on 12/02/15 in her absence.  The pt's research nurse will complete the pt's SAE reporting.  Brion Aliment RN, BSN, CCRP Clinical Research Nurse 12/06/2015 9:16 AM

## 2015-12-02 NOTE — Progress Notes (Signed)
Pt VSS. No complaints or concerns. IV removed. Tele d/c'd. D/C summary reviewed at bedside w/ wife. No questions.

## 2015-12-02 NOTE — Progress Notes (Signed)
Subjective: Much improved.   Exam: Filed Vitals:   12/01/15 2332 12/02/15 0451  BP: 160/89 147/73  Pulse: 96 89  Temp: 98.1 F (36.7 C) 97.6 F (36.4 C)  Resp: 18 18   Gen: In bed, NAD Resp: non-labored breathing, no acute distress Abd: soft, nt  Neuro: MS: awake, alert, oriented to person, place, month, but not year.  BP:ZWCH, face symmetric Motor: 5/5 throughout.  Sensory:intact to LT  Pertinent Labs: Low cortisol.   Impression: 80 yo M with altered mental status. Given duration of symptoms with negative imaging, do not think that ischemia is liekly. Still reasonable to perform EEG, but if negatvie, then I think it is much more liekly to have been metabolic etiehr related to dehydration or his low cortisol.   Recommendations: 1) workup of cortisol deficiency per IM 2) EEG 3) If EEG is normal, then neurology will sign off. Please call with any further questions.   Roland Rack, MD [facility] [phone]  If 7pm- 7am, please page neurology on call as listed in [place].

## 2015-12-02 NOTE — Procedures (Signed)
History: 81 yo M who presented with confusion  Sedation: None  Technique: This is a 21 channel routine scalp EEG performed at the bedside with bipolar and monopolar montages arranged in accordance to the international 10/20 system of electrode placement. One channel was dedicated to EKG recording.    Background: There is theta range slowing of the background throughout much of the study, but the PDR does achieve a frequency of 8 Hz at times.  Sleep is recorded with normal appearing structures.   Photic stimulation: Physiologic driving is not performed  EEG Abnormalities: 1) Slow PDR  Clinical Interpretation: This borderline EEG is suggestive of a mild generalized non-specific cerebral dysfunction(encephalopathy). There was no seizure or seizure predisposition recorded on this study. Please note that a normal EEG does not preclude the possibility of epilepsy.   Roland Rack, MD [facility] [phone]  If 7pm- 7am, please page neurology on call as listed in [place].

## 2015-12-02 NOTE — Progress Notes (Signed)
EEG completed; results pending.    

## 2015-12-03 LAB — HEMOGLOBIN A1C
Hgb A1c MFr Bld: 6.4 % — ABNORMAL HIGH (ref 4.8–5.6)
Mean Plasma Glucose: 137 mg/dL

## 2015-12-03 NOTE — Discharge Summary (Signed)
[facility] Discharge Summary   Patient: Omar Peters    DPO:[id number] PCP: Tamsen Roers, MD    DOB: Jun 01, 1935 Date of admission: 12/01/2015  Date of discharge:  12/02/2015  Discharge Diagnoses:  Active Problems:   Bone metastasis ([place])   Cancer of upper lobe of right lung (HCC)   Hypertension   Dehydration   Hypokalemia   Aphasia   Acute encephalopathy   Recommendations for Outpatient Follow-up:  1. Follow up with hematology as scheduled. 2. Maintain adequate hydration.   Diet recommendation: regular diet  Activity: The patient is advised to gradually reintroduce usual activities.  Discharge Condition: good  History of present illness: As per the H and P dictated on admission, "Omar Peters is a 80 y.o. male   has a past medical history of Hypertension; GERD (gastroesophageal reflux disease); Shortness of breath dyspnea; Anxiety; Macular degeneration of right eye; Cancer ([place]); and Diabetes mellitus without complication ([place]).   Presented with confusion starting this morning at 2:30 AM he woke up seemingly was speaking nonsense.. Day he had a planned CAT scan for restaging. Patient's wife reports that he had some mild confusion to days ago and seems not been having trouble communicating today. He hasn't had any fevers or chills no headache patient has not been eating well. She called to oncology who recommended further take patient to emergency department. Patient presented to [facility] long. On arrival patient was unable to answer questions in complete sentences was unable to say what his birthday or his wife's name patient appears to be disoriented. Initial workup showed CT scan of the brain showing only atrophy MRI of the brain showing no acute infarct. UA was unremarkable patient clinically didn't appear to be somewhat dehydrated.  He denies any fever have not had any pain medications today or prior. No Headaches.  Patient has stage IV non-small cell lung cancer  diagnosed in January 2016 associated with pleural effusion and bone metastases. Patient undergone Pleurx catheter placement and is currently status post chemotherapy and recently has been treated with immunotherapy status post 13 cycles. He has tolerated his therapy well. Patient has a history of diabetes which is well controlled."  Hospital Course:  Summary of his active problems in the hospital is as following. Principal Problem:   Acute encephalopathy   Aphasia Patient presented with garbled speech without any other neurological deficit. CT and MRI head were negative for acute abnormalities. EEG was showing metabolic encephalopathy without any seizure focus. No evidence of UTI, pneumonia or any other infection on CT chest, abdominal, pelvis as well as CT head. No leucocytosis or fever. Patient symptoms improved rapidly with hydration. His cortisol level were low but he passed the cosyntropin stimulation test with appropriate rise in the cotisol level. He was talking normal sentences on my eval and walk in the hallway without any support with myself. Thus most likely cause of his presentation was dehydration and he appears to be at his baseline.  Active Problems:   Bone metastasis ([place])   Cancer of upper lobe of right lung ([place]) Continue follow up with hematology as scheduled.    Hypertension blood pressure remained well controlled.   Hypokalemia Resolved.  All other chronic medical condition were stable during the hospitalization.  Patient was ambulatory without any assistance.  On the day of the discharge the patient's mentation was at his baseline, and no other acute medical condition were reported by patient. the patient was felt safe to be discharge at home with family support.  Procedures and Results:  EEG mild generalized non-specific cerebral dysfunction(encephalopathy). There was no seizure or seizure predisposition recorded on this study  cosyntropin stimulation  test.   12/02/2015 11:30  Cortisol, Base 6.0  Cortisol, 30 Min 23.2  Cortisol, 60 Min 27.3   Consultations:  Neurology   Discharge Exam: Filed Weights   12/01/15 2332  Weight: 65.318 kg (144 lb)   Filed Vitals:   12/02/15 1056 12/02/15 1406  BP: 146/73 156/80  Pulse: 91 93  Temp: 97.8 F (36.6 C) 98 F (36.7 C)  Resp: 18 18    General: Appear in no distress, no Rash; Oral Mucosa moist. Cardiovascular: S1 and S2 Present, no Murmur, no JVD Respiratory: Bilateral Air entry present and Clear to Auscultation, no Crackles, no wheezes Abdomen: Bowel Sound present, Soft and no tenderness Extremities: no Pedal edema, no calf tenderness Neurology: Grossly no focal neuro deficit.  DISCHARGE MEDICATION: Discharge Instructions    Diet - low sodium heart healthy    Complete by:  As directed      Increase activity slowly    Complete by:  As directed           Discharge Medication List as of 12/02/2015  5:15 PM    CONTINUE these medications which have NOT CHANGED   Details  aspirin 81 MG tablet Take 81 mg by mouth daily., Until Discontinued, Historical Med    B Complex-Biotin-FA (B-COMPLEX PO) Take 1 each by mouth daily., Until Discontinued, Historical Med    HYDROcodone-acetaminophen (NORCO) 5-325 MG per tablet Take 1 tablet by mouth every 6 (six) hours as needed for moderate pain., Starting 01/15/2015, Until Discontinued, Print    metFORMIN (GLUCOPHAGE-XR) 500 MG 24 hr tablet Take 500 mg by mouth daily with breakfast., Until Discontinued, Historical Med    prochlorperazine (COMPAZINE) 10 MG tablet TAKE ONE TABLET BY MOUTH EVERY 6 HOURS AS NEEDED FOR NAUSEA AND VOMITING, Normal    ranitidine (ZANTAC) 150 MG tablet Take 150 mg by mouth daily., Until Discontinued, Historical Med    senna-docusate (SENNA S) 8.6-50 MG per tablet Take 2 tablets by mouth daily., Until Discontinued, Historical Med       Allergies  Allergen Reactions  . Amitriptyline Other (See Comments)     hallucinations  . Marinol [Dronabinol] Other (See Comments)    confusion   Follow-up Information    Follow up with Eilleen Kempf., MD. Daphane Shepherd on 12/08/2015.   Specialty:  Oncology   Why:  and call as needed., If symptoms worsen   Contact information:   [address] [place] [address] [phone]       The results of significant diagnostics from this hospitalization (including imaging, microbiology, ancillary and laboratory) are listed below for reference.    Significant Diagnostic Studies: Ct Head Wo Contrast  12/01/2015  CLINICAL DATA:  Was speaking non since after waking up at 0230 hours today, unable to speak appropriately, stroke symptoms, history hypertension, diabetes mellitus, former smoker, RIGHT upper lobe lung cancer EXAM: CT HEAD WITHOUT CONTRAST TECHNIQUE: Contiguous axial images were obtained from the base of the skull through the vertex without intravenous contrast. COMPARISON:  None; correlation MR brain 04/07/2015 FINDINGS: Generalized atrophy. Normal ventricular morphology. No midline shift or mass effect. Small vessel chronic ischemic changes of deep cerebral white matter. No intracranial hemorrhage, mass lesion, or acute infarction. Visualized paranasal sinuses and mastoid air cells clear. Bones unremarkable. Atherosclerotic calcification of internal carotid and vertebral arteries at skullbase. IMPRESSION: Atrophy with small vessel chronic ischemic  changes of deep cerebral white matter. No acute intracranial abnormalities. Electronically Signed   By: Lavonia Dana M.D.   On: 12/01/2015 16:36   Ct Chest W Contrast  12/01/2015  CLINICAL DATA:  Metastatic lung cancer, ongoing chemotherapy. EXAM: CT CHEST, ABDOMEN, AND PELVIS WITH CONTRAST TECHNIQUE: Multidetector CT imaging of the chest, abdomen and pelvis was performed following the standard protocol during bolus administration of intravenous contrast. CONTRAST:  114m OMNIPAQUE IOHEXOL 300 MG/ML  SOLN COMPARISON:   10/06/2015. RECIST 1.1 Target Lesions: 1. Right upper lobe mass, approximately 5.1 cm (series 4, image 20), difficult to measure due to heterogeneity and surrounding collapse/ consolidation, as well is pleural fluid. 2. Non-target Lesions: 1. AP window lymph node, 9 mm (image 23), previously 11 mm. 2. Subcarinal lymph node, present. 3. Multiple bone lesions, lytic, up to 3.9 cm (series 2, image 92), stable on axial imaging but increased on coronal imaging, as below. 4. Right pleural effusion, present. 5. Pre/paratracheal lymph nodes, present (image 23). 6. Left lower lobe pulmonary nodules, stable (image 38). FINDINGS: CT CHEST FINDINGS Mediastinum/Nodes: Mediastinal lymph nodes measure up to 8 mm in the lower right paratracheal station (series 2, image 23), stable. No left hilar or axillary adenopathy. Soft tissue thickening in the right hilum, stable. Three-vessel coronary artery calcification. Heart size normal. No pericardial effusion. Lungs/Pleura: A small amount of loculated pleural fluid is seen in the upper and lower aspects of the right hemi thorax, stable. Heterogeneous masslike consolidation in the right upper lobe appears larger, however there is surrounding collapse/ consolidation. Approximate measurement is 5.1 cm in longest transverse dimension (series 4, image 20), previously 3.8 cm. Posttreatment changes are seen in the surrounding right lung. Septal thickening, ground-glass and subpleural consolidation in the right lower lobe, as before. Centrilobular emphysema. Minimal peribronchovascular ground-glass nodularity in the left lower lobe (image 37), stable. Musculoskeletal: Question slight lytic destruction of the right scapula, at the glenoid (series 4, image 11). Mildly sclerotic lesion in the T11 vertebral body is unchanged. Pretracheal nodule measures 1.5 cm (image 7), decreased from 1.7 cm. CT ABDOMEN PELVIS FINDINGS Hepatobiliary: The liver margin is irregular. Liver and gallbladder are  otherwise unremarkable. Extrahepatic bile duct measures up to 13 mm stable. Pancreas: Negative. Spleen: Negative. Adrenals/Urinary Tract: Adrenal glands are unremarkable. 7 mm low-attenuation lesion in the right kidney is too small to characterize but stable and statistically, likely represents a cyst. Kidneys are otherwise unremarkable. Ureters are decompressed. Bladder is grossly unremarkable. Stomach/Bowel: Stomach, small bowel, appendix and colon are unremarkable. Vascular/Lymphatic: Atherosclerotic calcification of the arterial vasculature without abdominal aortic aneurysm. Recanalized paraumbilical vein. No pathologically enlarged lymph nodes. Reproductive: Prostate is mildly enlarged. Other: Small right inguinal hernia contains fat. No free fluid. Tiny periumbilical hernia contains fat. Mesenteries and peritoneum are unremarkable. Musculoskeletal: Lytic lesion in the right iliac wing measures up to 4.2 cm (coronal image 61), increased from 3.5 cm. A lytic in the adjacent right iliac wing has enlarged, with a larger soft tissue component, measuring 1.1 x 1.7 cm (Series 2, image 92), previously 0.9 x 1.2 cm. IMPRESSION: 1. Right upper lobe mass is difficult to accurately measure but appears larger than on 10/06/2015, worrisome for disease progression. 2. Progression of osseous metastatic disease with a suspected new lesion in the right glenoid and enlarging lesions in the right iliac wing. 3. Three-vessel coronary artery calcification. 4. Cirrhosis. 5. Enlarged prostate. Electronically Signed   By: MLorin PicketM.D.   On: 12/01/2015 09:36   Mr Brain Wo Contrast  12/01/2015  CLINICAL DATA:  Acute onset of aphasia beginning at 2:30 a.m. today. Speech has since returned. The examination had to be discontinued prior to completion due to the patient becoming combative and refusing further imaging. EXAM: MRI HEAD WITHOUT CONTRAST TECHNIQUE: Multiplanar, multiecho pulse sequences of the brain and surrounding  structures were obtained without intravenous contrast. COMPARISON:  CT head without contrast 12/01/2015. FINDINGS: The diffusion-weighted images demonstrate no evidence for acute or subacute infarction. Moderate atrophy and white matter disease is similar to the prior exam. No acute hemorrhage or mass lesion is present. The ventricles are proportionate to the degree of atrophy. The internal auditory canals are within normal limits. The brainstem and cerebellum are normal. Flow is present in the major intracranial arteries. A right lens replacement is present. The globes and orbits are otherwise intact. The paranasal sinuses and mastoid air cells are clear. Midline sagittal images are within normal limits. IMPRESSION: 1. No acute infarct. 2. Stable atrophy and white matter disease. This likely reflects the sequela of chronic microvascular ischemia. Electronically Signed   By: San Morelle M.D.   On: 12/01/2015 18:20   Ct Abdomen Pelvis W Contrast  12/01/2015  CLINICAL DATA:  Metastatic lung cancer, ongoing chemotherapy. EXAM: CT CHEST, ABDOMEN, AND PELVIS WITH CONTRAST TECHNIQUE: Multidetector CT imaging of the chest, abdomen and pelvis was performed following the standard protocol during bolus administration of intravenous contrast. CONTRAST:  117m OMNIPAQUE IOHEXOL 300 MG/ML  SOLN COMPARISON:  10/06/2015. RECIST 1.1 Target Lesions: 1. Right upper lobe mass, approximately 5.1 cm (series 4, image 20), difficult to measure due to heterogeneity and surrounding collapse/ consolidation, as well is pleural fluid. 2. Non-target Lesions: 1. AP window lymph node, 9 mm (image 23), previously 11 mm. 2. Subcarinal lymph node, present. 3. Multiple bone lesions, lytic, up to 3.9 cm (series 2, image 92), stable on axial imaging but increased on coronal imaging, as below. 4. Right pleural effusion, present. 5. Pre/paratracheal lymph nodes, present (image 23). 6. Left lower lobe pulmonary nodules, stable (image 38).  FINDINGS: CT CHEST FINDINGS Mediastinum/Nodes: Mediastinal lymph nodes measure up to 8 mm in the lower right paratracheal station (series 2, image 23), stable. No left hilar or axillary adenopathy. Soft tissue thickening in the right hilum, stable. Three-vessel coronary artery calcification. Heart size normal. No pericardial effusion. Lungs/Pleura: A small amount of loculated pleural fluid is seen in the upper and lower aspects of the right hemi thorax, stable. Heterogeneous masslike consolidation in the right upper lobe appears larger, however there is surrounding collapse/ consolidation. Approximate measurement is 5.1 cm in longest transverse dimension (series 4, image 20), previously 3.8 cm. Posttreatment changes are seen in the surrounding right lung. Septal thickening, ground-glass and subpleural consolidation in the right lower lobe, as before. Centrilobular emphysema. Minimal peribronchovascular ground-glass nodularity in the left lower lobe (image 37), stable. Musculoskeletal: Question slight lytic destruction of the right scapula, at the glenoid (series 4, image 11). Mildly sclerotic lesion in the T11 vertebral body is unchanged. Pretracheal nodule measures 1.5 cm (image 7), decreased from 1.7 cm. CT ABDOMEN PELVIS FINDINGS Hepatobiliary: The liver margin is irregular. Liver and gallbladder are otherwise unremarkable. Extrahepatic bile duct measures up to 13 mm stable. Pancreas: Negative. Spleen: Negative. Adrenals/Urinary Tract: Adrenal glands are unremarkable. 7 mm low-attenuation lesion in the right kidney is too small to characterize but stable and statistically, likely represents a cyst. Kidneys are otherwise unremarkable. Ureters are decompressed. Bladder is grossly unremarkable. Stomach/Bowel: Stomach, small bowel, appendix and colon are  unremarkable. Vascular/Lymphatic: Atherosclerotic calcification of the arterial vasculature without abdominal aortic aneurysm. Recanalized paraumbilical vein. No  pathologically enlarged lymph nodes. Reproductive: Prostate is mildly enlarged. Other: Small right inguinal hernia contains fat. No free fluid. Tiny periumbilical hernia contains fat. Mesenteries and peritoneum are unremarkable. Musculoskeletal: Lytic lesion in the right iliac wing measures up to 4.2 cm (coronal image 61), increased from 3.5 cm. A lytic in the adjacent right iliac wing has enlarged, with a larger soft tissue component, measuring 1.1 x 1.7 cm (Series 2, image 92), previously 0.9 x 1.2 cm. IMPRESSION: 1. Right upper lobe mass is difficult to accurately measure but appears larger than on 10/06/2015, worrisome for disease progression. 2. Progression of osseous metastatic disease with a suspected new lesion in the right glenoid and enlarging lesions in the right iliac wing. 3. Three-vessel coronary artery calcification. 4. Cirrhosis. 5. Enlarged prostate. Electronically Signed   By: Lorin Picket M.D.   On: 12/01/2015 09:36    Microbiology: No results found for this or any previous visit (from the past 240 hour(s)).   Labs: CBC:  Recent Labs Lab 12/01/15 1601 12/01/15 1618 12/02/15 0400  WBC 8.9  --  6.9  NEUTROABS 5.7  --   --   HGB 14.0 15.0 12.8*  HCT 41.0 44.0 38.0*  MCV 96.7  --  97.7  PLT 221  --  343   Basic Metabolic Panel:  Recent Labs Lab 12/01/15 1601 12/01/15 1618 12/02/15 0400  NA 137 138 140  K 3.3* 3.3* 3.4*  CL 98* 96* 105  CO2 32  --  28  GLUCOSE 123* 118* 120*  BUN '9 8 8  '$ CREATININE 0.92 0.80 0.76  CALCIUM 9.2  --  8.8*  MG  --   --  1.9  PHOS  --   --  2.9   Liver Function Tests:  Recent Labs Lab 12/01/15 1601 12/02/15 0400  AST 20 18  ALT 18 16*  ALKPHOS 83 71  BILITOT 0.3 0.7  PROT 7.3 6.3*  ALBUMIN 3.5 3.0*   No results for input(s): LIPASE, AMYLASE in the last 168 hours.  Recent Labs Lab 12/02/15 0400  AMMONIA 36*    Cardiac Enzymes:  Recent Labs Lab 12/02/15 0400  TROPONINI <0.03   BNP (last 3 results) No  results for input(s): BNP in the last 8760 hours.  ProBNP (last 3 results) No results for input(s): PROBNP in the last 8760 hours.  CBG:  Recent Labs Lab 12/01/15 1641 12/01/15 2345 12/02/15 0756 12/02/15 1240 12/02/15 1648  GLUCAP 118* 104* 186* 110* 172*    Time spent: 30 minutes  Signed:  Shenia Alan  [facility] 12/03/2015, 2:26 PM

## 2015-12-06 ENCOUNTER — Ambulatory Visit: Payer: Medicare Other | Admitting: Internal Medicine

## 2015-12-06 ENCOUNTER — Ambulatory Visit: Payer: Medicare Other

## 2015-12-06 ENCOUNTER — Other Ambulatory Visit: Payer: Medicare Other

## 2015-12-06 ENCOUNTER — Telehealth: Payer: Self-pay | Admitting: Medical Oncology

## 2015-12-06 NOTE — Telephone Encounter (Signed)
[id number] F/U call to patient to see how he was, patient d/c from hospital on 12/15. LVMOM for patient to return call at his earliest convenience.

## 2015-12-07 ENCOUNTER — Other Ambulatory Visit: Payer: Self-pay | Admitting: Medical Oncology

## 2015-12-07 ENCOUNTER — Other Ambulatory Visit: Payer: Self-pay | Admitting: *Deleted

## 2015-12-07 DIAGNOSIS — C3411 Malignant neoplasm of upper lobe, right bronchus or lung: Secondary | ICD-10-CM

## 2015-12-07 DIAGNOSIS — Z79899 Other long term (current) drug therapy: Secondary | ICD-10-CM

## 2015-12-08 ENCOUNTER — Other Ambulatory Visit: Payer: Self-pay | Admitting: Medical Oncology

## 2015-12-08 ENCOUNTER — Encounter: Payer: Self-pay | Admitting: Internal Medicine

## 2015-12-08 ENCOUNTER — Other Ambulatory Visit (HOSPITAL_BASED_OUTPATIENT_CLINIC_OR_DEPARTMENT_OTHER): Payer: Medicare Other

## 2015-12-08 ENCOUNTER — Telehealth: Payer: Self-pay | Admitting: Internal Medicine

## 2015-12-08 ENCOUNTER — Ambulatory Visit (HOSPITAL_BASED_OUTPATIENT_CLINIC_OR_DEPARTMENT_OTHER): Payer: Medicare Other | Admitting: Internal Medicine

## 2015-12-08 ENCOUNTER — Encounter: Payer: Self-pay | Admitting: *Deleted

## 2015-12-08 ENCOUNTER — Encounter: Payer: Self-pay | Admitting: Medical Oncology

## 2015-12-08 ENCOUNTER — Ambulatory Visit (HOSPITAL_BASED_OUTPATIENT_CLINIC_OR_DEPARTMENT_OTHER): Payer: Medicare Other

## 2015-12-08 ENCOUNTER — Other Ambulatory Visit (HOSPITAL_COMMUNITY)
Admission: RE | Admit: 2015-12-08 | Discharge: 2015-12-08 | Disposition: A | Discharge status: Home / Self Care | Payer: Medicare Other | Source: Ambulatory Visit | Attending: Internal Medicine | Admitting: Internal Medicine

## 2015-12-08 VITALS — BP 164/78 | HR 99 | Temp 98.4°F | Resp 18 | Ht 66.0 in | Wt 146.4 lb

## 2015-12-08 DIAGNOSIS — C3411 Malignant neoplasm of upper lobe, right bronchus or lung: Secondary | ICD-10-CM

## 2015-12-08 DIAGNOSIS — Z79899 Other long term (current) drug therapy: Secondary | ICD-10-CM

## 2015-12-08 DIAGNOSIS — Z006 Encounter for examination for normal comparison and control in clinical research program: Secondary | ICD-10-CM

## 2015-12-08 DIAGNOSIS — Z5112 Encounter for antineoplastic immunotherapy: Secondary | ICD-10-CM

## 2015-12-08 DIAGNOSIS — C3410 Malignant neoplasm of upper lobe, unspecified bronchus or lung: Secondary | ICD-10-CM | POA: Insufficient documentation

## 2015-12-08 DIAGNOSIS — C7951 Secondary malignant neoplasm of bone: Secondary | ICD-10-CM

## 2015-12-08 LAB — COMPREHENSIVE METABOLIC PANEL
ALBUMIN: 3.1 g/dL — AB (ref 3.5–5.0)
ALK PHOS: 99 U/L (ref 40–150)
ALT: 12 U/L (ref 0–55)
ANION GAP: 9 meq/L (ref 3–11)
AST: 16 U/L (ref 5–34)
BILIRUBIN TOTAL: 0.44 mg/dL (ref 0.20–1.20)
BUN: 13.2 mg/dL (ref 7.0–26.0)
CALCIUM: 9.5 mg/dL (ref 8.4–10.4)
CO2: 27 mEq/L (ref 22–29)
CREATININE: 0.9 mg/dL (ref 0.7–1.3)
Chloride: 105 mEq/L (ref 98–109)
EGFR: 83 mL/min/{1.73_m2} — AB (ref >90)
Glucose: 112 mg/dl (ref 70–140)
Potassium: 3.4 mEq/L — ABNORMAL LOW (ref 3.5–5.1)
Sodium: 140 mEq/L (ref 136–145)
TOTAL PROTEIN: 7.3 g/dL (ref 6.4–8.3)

## 2015-12-08 LAB — CBC WITH DIFFERENTIAL/PLATELET
BASO%: 1.4 % (ref 0.0–2.0)
BASOS ABS: 0.1 10*3/uL (ref 0.0–0.1)
EOS%: 5.2 % (ref 0.0–7.0)
Eosinophils Absolute: 0.4 10*3/uL (ref 0.0–0.5)
HCT: 43.3 % (ref 38.4–49.9)
HGB: 14.1 g/dL (ref 13.0–17.1)
LYMPH%: 23.2 % (ref 14.0–49.0)
MCH: 31.9 pg (ref 27.2–33.4)
MCHC: 32.6 g/dL (ref 32.0–36.0)
MCV: 97.8 fL (ref 79.3–98.0)
MONO#: 0.9 10*3/uL (ref 0.1–0.9)
MONO%: 11.7 % (ref 0.0–14.0)
NEUT#: 4.7 10*3/uL (ref 1.5–6.5)
NEUT%: 58.5 % (ref 39.0–75.0)
PLATELETS: 221 10*3/uL (ref 140–400)
RBC: 4.43 10*6/uL (ref 4.20–5.82)
RDW: 13.3 % (ref 11.0–14.6)
WBC: 8 10*3/uL (ref 4.0–10.3)
lymph#: 1.9 10*3/uL (ref 0.9–3.3)

## 2015-12-08 LAB — PHOSPHORUS: Phosphorus: 3.7 mg/dL (ref 2.5–4.6)

## 2015-12-08 LAB — T4, FREE: FREE T4: 0.91 ng/dL (ref 0.80–1.80)

## 2015-12-08 LAB — T3, FREE: T3, Free: 2.6 pg/mL (ref 2.3–4.2)

## 2015-12-08 LAB — LACTATE DEHYDROGENASE: LDH: 125 U/L (ref 125–245)

## 2015-12-08 LAB — TSH: TSH: 7.36 m[IU]/L — AB (ref 0.320–4.118)

## 2015-12-08 LAB — MAGNESIUM: MAGNESIUM: 2 mg/dL (ref 1.5–2.5)

## 2015-12-08 MED ORDER — SODIUM CHLORIDE 0.9 % IV SOLN
240.0000 mg | Freq: Once | INTRAVENOUS | Status: AC
  Administered 2015-12-08: 240 mg via INTRAVENOUS
  Filled 2015-12-08: qty 24

## 2015-12-08 MED ORDER — SODIUM CHLORIDE 0.9 % IV SOLN
Freq: Once | INTRAVENOUS | Status: AC
  Administered 2015-12-08: 11:00:00 via INTRAVENOUS

## 2015-12-08 NOTE — Progress Notes (Signed)
[facility] [phone]   Fax:([phone]) [place], MD [address] [place] [address]  DIAGNOSIS: Stage IV (T3, N3, M1b) non-small cell lung cancer, adenocarcinoma diagnosed in January 2016 presented with right upper lobe lung mass in addition to mediastinal lymphadenopathy and large right pleural effusion as well as bone metastasis.  PRIOR THERAPY:  1. Status post Pleurx catheter placement by Dr. Servando Snare for drainage of the right pleural effusion.  2. Systemic chemotherapy with carboplatin for AUC of 5 and Alimta 500 MG/M2 every 3 weeks. Status post 6 cycles.  CURRENT THERAPY: Patient is participating in the [place] 370 clinical trial and has been randomized to group A arm B-3 to receive Nivolumab 240 mg given every 2 weeks. First cycle expected 06/10/2015. Status post 13 cycles.  INTERVAL HISTORY: Omar Peters 80 y.o. male returns to the clinic today for follow-up visit accompanied by his wife. The patient is feeling fine today with no specific complaints except for fatigue. He is tolerating his treatment with Nivolumab fairly well with no significant adverse effects. He has some mental status changes and confusion after his CT scan of the chest, abdomen and pelvis last week. He was admitted to [facility] and this was felt to be secondary to dehydration and poor by mouth intake. He had MRI of the brain performed at that time that showed no evidence for metastatic disease or stroke. The patient denied having any significant skin rash, diarrhea. He has no nausea or vomiting. No significant headache or visual changes. He has no chest pain, shortness of breath, cough or hemoptysis. He has no significant weight loss or night sweats. He has some right shoulder pain but he does a lot of fishing. He had repeat CT scan of the chest, abdomen and pelvis performed recently and he is here today for evaluation and  discussion of his scan results before starting cycle #14 of his immunotherapy.  MEDICAL HISTORY: Past Medical History  Diagnosis Date  . Hypertension   . GERD (gastroesophageal reflux disease)   . Shortness of breath dyspnea     01/14/15 at night and has to sit up  . Anxiety     due to diagnosis of Stage 4 Lung Cancer  . Macular degeneration of right eye   . Cancer ([place])     Lung  . Diabetes mellitus without complication (HCC)     ALLERGIES:  is allergic to amitriptyline and marinol.  MEDICATIONS:  Current Outpatient Prescriptions  Medication Sig Dispense Refill  . aspirin 81 MG tablet Take 81 mg by mouth daily.    . B Complex-Biotin-FA (B-COMPLEX PO) Take 1 each by mouth daily.    . metFORMIN (GLUCOPHAGE-XR) 500 MG 24 hr tablet Take 500 mg by mouth daily with breakfast.    . ranitidine (ZANTAC) 150 MG tablet Take 150 mg by mouth daily.    Marland Kitchen senna-docusate (SENNA S) 8.6-50 MG per tablet Take 2 tablets by mouth daily.    Marland Kitchen HYDROcodone-acetaminophen (NORCO) 5-325 MG per tablet Take 1 tablet by mouth every 6 (six) hours as needed for moderate pain. (Patient not taking: Reported on 12/08/2015) 30 tablet 0  . prochlorperazine (COMPAZINE) 10 MG tablet TAKE ONE TABLET BY MOUTH EVERY 6 HOURS AS NEEDED FOR NAUSEA AND VOMITING (Patient not taking: Reported on 12/08/2015) 30 tablet 0   No current facility-administered medications for this visit.    SURGICAL HISTORY:  Past Surgical  History  Procedure Laterality Date  . Vein ligation and stripping  1977  . Chest tube insertion Right 01/15/2015    Procedure: INSERTION PLEURAL DRAINAGE CATHETER WITH ULTRASOUND AND FLURO GUIDANCE;  Surgeon: Grace Isaac, MD;  Location: [facility];  Service: Thoracic;  Laterality: Right;  . Removal of pleural drainage catheter Right 03/26/2015    Procedure: REMOVAL OF PLEURAL DRAINAGE CATHETER;  Surgeon: Grace Isaac, MD;  Location: [facility];  Service: Thoracic;  Laterality: Right;    REVIEW OF SYSTEMS:   Constitutional: positive for fatigue Eyes: negative Ears, nose, mouth, throat, and face: negative Respiratory: negative Cardiovascular: negative Gastrointestinal: negative Genitourinary:negative Integument/breast: negative Hematologic/lymphatic: negative Musculoskeletal:negative Neurological: negative Behavioral/Psych: negative Endocrine: negative Allergic/Immunologic: negative   PHYSICAL EXAMINATION: General appearance: alert, cooperative and no distress Head: Normocephalic, without obvious abnormality, atraumatic Neck: no adenopathy, no JVD, supple, symmetrical, trachea midline and thyroid not enlarged, symmetric, no tenderness/mass/nodules Lymph nodes: Cervical, supraclavicular, and axillary nodes normal. Resp: clear to auscultation bilaterally Back: symmetric, no curvature. ROM normal. No CVA tenderness. Cardio: regular rate and rhythm, S1, S2 normal, no murmur, click, rub or gallop GI: soft, non-tender; bowel sounds normal; no masses,  no organomegaly Extremities: extremities normal, atraumatic, no cyanosis or edema Neurologic: Alert and oriented X 3, normal strength and tone. Normal symmetric reflexes. Normal coordination and gait  ECOG PERFORMANCE STATUS: 1 - Symptomatic but completely ambulatory  Blood pressure 164/78, pulse 99, temperature 98.4 F (36.9 C), temperature source Oral, resp. rate 18, height '5\' 6"'$  (1.676 m), weight 146 lb 6.4 oz (66.407 kg), SpO2 97 %.  LABORATORY DATA: Lab Results  Component Value Date   WBC 8.0 12/08/2015   HGB 14.1 12/08/2015   HCT 43.3 12/08/2015   MCV 97.8 12/08/2015   PLT 221 12/08/2015      Chemistry      Component Value Date/Time   NA 140 12/02/2015 0400   NA 141 11/22/2015 1028   K 3.4* 12/02/2015 0400   K 3.8 11/22/2015 1028   CL 105 12/02/2015 0400   CO2 28 12/02/2015 0400   CO2 28 11/22/2015 1028   BUN 8 12/02/2015 0400   BUN 10.9 11/22/2015 1028   CREATININE 0.76 12/02/2015 0400   CREATININE 1.1 11/22/2015 1028     GLU 231* 03/10/2015 1539      Component Value Date/Time   CALCIUM 8.8* 12/02/2015 0400   CALCIUM 10.2 11/22/2015 1028   ALKPHOS 71 12/02/2015 0400   ALKPHOS 92 11/22/2015 1028   AST 18 12/02/2015 0400   AST 18 11/22/2015 1028   ALT 16* 12/02/2015 0400   ALT 16 11/22/2015 1028   BILITOT 0.7 12/02/2015 0400   BILITOT 0.40 11/22/2015 1028       RADIOGRAPHIC STUDIES: Ct Head Wo Contrast  12/01/2015  CLINICAL DATA:  Was speaking non since after waking up at 0230 hours today, unable to speak appropriately, stroke symptoms, history hypertension, diabetes mellitus, former smoker, RIGHT upper lobe lung cancer EXAM: CT HEAD WITHOUT CONTRAST TECHNIQUE: Contiguous axial images were obtained from the base of the skull through the vertex without intravenous contrast. COMPARISON:  None; correlation MR brain 04/07/2015 FINDINGS: Generalized atrophy. Normal ventricular morphology. No midline shift or mass effect. Small vessel chronic ischemic changes of deep cerebral white matter. No intracranial hemorrhage, mass lesion, or acute infarction. Visualized paranasal sinuses and mastoid air cells clear. Bones unremarkable. Atherosclerotic calcification of internal carotid and vertebral arteries at skullbase. IMPRESSION: Atrophy with small vessel chronic ischemic changes of deep cerebral white matter.  No acute intracranial abnormalities. Electronically Signed   By: Lavonia Dana M.D.   On: 12/01/2015 16:36   Ct Chest W Contrast  12/01/2015  CLINICAL DATA:  Metastatic lung cancer, ongoing chemotherapy. EXAM: CT CHEST, ABDOMEN, AND PELVIS WITH CONTRAST TECHNIQUE: Multidetector CT imaging of the chest, abdomen and pelvis was performed following the standard protocol during bolus administration of intravenous contrast. CONTRAST:  156m OMNIPAQUE IOHEXOL 300 MG/ML  SOLN COMPARISON:  10/06/2015. RECIST 1.1 Target Lesions: 1. Right upper lobe mass, approximately 5.1 cm (series 4, image 20), difficult to measure due to  heterogeneity and surrounding collapse/ consolidation, as well is pleural fluid. 2. Non-target Lesions: 1. AP window lymph node, 9 mm (image 23), previously 11 mm. 2. Subcarinal lymph node, present. 3. Multiple bone lesions, lytic, up to 3.9 cm (series 2, image 92), stable on axial imaging but increased on coronal imaging, as below. 4. Right pleural effusion, present. 5. Pre/paratracheal lymph nodes, present (image 23). 6. Left lower lobe pulmonary nodules, stable (image 38). FINDINGS: CT CHEST FINDINGS Mediastinum/Nodes: Mediastinal lymph nodes measure up to 8 mm in the lower right paratracheal station (series 2, image 23), stable. No left hilar or axillary adenopathy. Soft tissue thickening in the right hilum, stable. Three-vessel coronary artery calcification. Heart size normal. No pericardial effusion. Lungs/Pleura: A small amount of loculated pleural fluid is seen in the upper and lower aspects of the right hemi thorax, stable. Heterogeneous masslike consolidation in the right upper lobe appears larger, however there is surrounding collapse/ consolidation. Approximate measurement is 5.1 cm in longest transverse dimension (series 4, image 20), previously 3.8 cm. Posttreatment changes are seen in the surrounding right lung. Septal thickening, ground-glass and subpleural consolidation in the right lower lobe, as before. Centrilobular emphysema. Minimal peribronchovascular ground-glass nodularity in the left lower lobe (image 37), stable. Musculoskeletal: Question slight lytic destruction of the right scapula, at the glenoid (series 4, image 11). Mildly sclerotic lesion in the T11 vertebral body is unchanged. Pretracheal nodule measures 1.5 cm (image 7), decreased from 1.7 cm. CT ABDOMEN PELVIS FINDINGS Hepatobiliary: The liver margin is irregular. Liver and gallbladder are otherwise unremarkable. Extrahepatic bile duct measures up to 13 mm stable. Pancreas: Negative. Spleen: Negative. Adrenals/Urinary Tract:  Adrenal glands are unremarkable. 7 mm low-attenuation lesion in the right kidney is too small to characterize but stable and statistically, likely represents a cyst. Kidneys are otherwise unremarkable. Ureters are decompressed. Bladder is grossly unremarkable. Stomach/Bowel: Stomach, small bowel, appendix and colon are unremarkable. Vascular/Lymphatic: Atherosclerotic calcification of the arterial vasculature without abdominal aortic aneurysm. Recanalized paraumbilical vein. No pathologically enlarged lymph nodes. Reproductive: Prostate is mildly enlarged. Other: Small right inguinal hernia contains fat. No free fluid. Tiny periumbilical hernia contains fat. Mesenteries and peritoneum are unremarkable. Musculoskeletal: Lytic lesion in the right iliac wing measures up to 4.2 cm (coronal image 61), increased from 3.5 cm. A lytic in the adjacent right iliac wing has enlarged, with a larger soft tissue component, measuring 1.1 x 1.7 cm (Series 2, image 92), previously 0.9 x 1.2 cm. IMPRESSION: 1. Right upper lobe mass is difficult to accurately measure but appears larger than on 10/06/2015, worrisome for disease progression. 2. Progression of osseous metastatic disease with a suspected new lesion in the right glenoid and enlarging lesions in the right iliac wing. 3. Three-vessel coronary artery calcification. 4. Cirrhosis. 5. Enlarged prostate. Electronically Signed   By: MLorin PicketM.D.   On: 12/01/2015 09:36   Mr Brain Wo Contrast  12/01/2015  CLINICAL DATA:  Acute onset of aphasia beginning at 2:30 a.m. today. Speech has since returned. The examination had to be discontinued prior to completion due to the patient becoming combative and refusing further imaging. EXAM: MRI HEAD WITHOUT CONTRAST TECHNIQUE: Multiplanar, multiecho pulse sequences of the brain and surrounding structures were obtained without intravenous contrast. COMPARISON:  CT head without contrast 12/01/2015. FINDINGS: The diffusion-weighted  images demonstrate no evidence for acute or subacute infarction. Moderate atrophy and white matter disease is similar to the prior exam. No acute hemorrhage or mass lesion is present. The ventricles are proportionate to the degree of atrophy. The internal auditory canals are within normal limits. The brainstem and cerebellum are normal. Flow is present in the major intracranial arteries. A right lens replacement is present. The globes and orbits are otherwise intact. The paranasal sinuses and mastoid air cells are clear. Midline sagittal images are within normal limits. IMPRESSION: 1. No acute infarct. 2. Stable atrophy and white matter disease. This likely reflects the sequela of chronic microvascular ischemia. Electronically Signed   By: San Morelle M.D.   On: 12/01/2015 18:20   Ct Abdomen Pelvis W Contrast  12/01/2015  CLINICAL DATA:  Metastatic lung cancer, ongoing chemotherapy. EXAM: CT CHEST, ABDOMEN, AND PELVIS WITH CONTRAST TECHNIQUE: Multidetector CT imaging of the chest, abdomen and pelvis was performed following the standard protocol during bolus administration of intravenous contrast. CONTRAST:  121m OMNIPAQUE IOHEXOL 300 MG/ML  SOLN COMPARISON:  10/06/2015. RECIST 1.1 Target Lesions: 1. Right upper lobe mass, approximately 5.1 cm (series 4, image 20), difficult to measure due to heterogeneity and surrounding collapse/ consolidation, as well is pleural fluid. 2. Non-target Lesions: 1. AP window lymph node, 9 mm (image 23), previously 11 mm. 2. Subcarinal lymph node, present. 3. Multiple bone lesions, lytic, up to 3.9 cm (series 2, image 92), stable on axial imaging but increased on coronal imaging, as below. 4. Right pleural effusion, present. 5. Pre/paratracheal lymph nodes, present (image 23). 6. Left lower lobe pulmonary nodules, stable (image 38). FINDINGS: CT CHEST FINDINGS Mediastinum/Nodes: Mediastinal lymph nodes measure up to 8 mm in the lower right paratracheal station (series 2,  image 23), stable. No left hilar or axillary adenopathy. Soft tissue thickening in the right hilum, stable. Three-vessel coronary artery calcification. Heart size normal. No pericardial effusion. Lungs/Pleura: A small amount of loculated pleural fluid is seen in the upper and lower aspects of the right hemi thorax, stable. Heterogeneous masslike consolidation in the right upper lobe appears larger, however there is surrounding collapse/ consolidation. Approximate measurement is 5.1 cm in longest transverse dimension (series 4, image 20), previously 3.8 cm. Posttreatment changes are seen in the surrounding right lung. Septal thickening, ground-glass and subpleural consolidation in the right lower lobe, as before. Centrilobular emphysema. Minimal peribronchovascular ground-glass nodularity in the left lower lobe (image 37), stable. Musculoskeletal: Question slight lytic destruction of the right scapula, at the glenoid (series 4, image 11). Mildly sclerotic lesion in the T11 vertebral body is unchanged. Pretracheal nodule measures 1.5 cm (image 7), decreased from 1.7 cm. CT ABDOMEN PELVIS FINDINGS Hepatobiliary: The liver margin is irregular. Liver and gallbladder are otherwise unremarkable. Extrahepatic bile duct measures up to 13 mm stable. Pancreas: Negative. Spleen: Negative. Adrenals/Urinary Tract: Adrenal glands are unremarkable. 7 mm low-attenuation lesion in the right kidney is too small to characterize but stable and statistically, likely represents a cyst. Kidneys are otherwise unremarkable. Ureters are decompressed. Bladder is grossly unremarkable. Stomach/Bowel: Stomach, small bowel, appendix and colon are unremarkable. Vascular/Lymphatic: Atherosclerotic calcification of  the arterial vasculature without abdominal aortic aneurysm. Recanalized paraumbilical vein. No pathologically enlarged lymph nodes. Reproductive: Prostate is mildly enlarged. Other: Small right inguinal hernia contains fat. No free fluid.  Tiny periumbilical hernia contains fat. Mesenteries and peritoneum are unremarkable. Musculoskeletal: Lytic lesion in the right iliac wing measures up to 4.2 cm (coronal image 61), increased from 3.5 cm. A lytic in the adjacent right iliac wing has enlarged, with a larger soft tissue component, measuring 1.1 x 1.7 cm (Series 2, image 92), previously 0.9 x 1.2 cm. IMPRESSION: 1. Right upper lobe mass is difficult to accurately measure but appears larger than on 10/06/2015, worrisome for disease progression. 2. Progression of osseous metastatic disease with a suspected new lesion in the right glenoid and enlarging lesions in the right iliac wing. 3. Three-vessel coronary artery calcification. 4. Cirrhosis. 5. Enlarged prostate. Electronically Signed   By: Lorin Picket M.D.   On: 12/01/2015 09:36    ASSESSMENT AND PLAN: This is a very pleasant 80 years old white male with history of stage IV non-small cell lung cancer, adenocarcinoma status post induction chemotherapy was carboplatin and Alimta and currently undergoing treatment with immunotherapy according to the [employer] check[place]ated 370 clinical trial. He was randomized to the Nivolumab arm 240 mg IV every 2 weeks. He is status post 13 cycles and tolerating his treatment fairly well. The recent CT scan of the chest, abdomen and pelvis showed increase in the size of the right upper lobe mass in addition to increase in the osseous metastatic disease as well as new lesion in the right glenoid and enlarging lesion in the right iliac wing.  These findings are concerning for disease progression but the patient is currently asymptomatic and tolerating his treatment fairly well. I discussed the scan results with the patient and his wife and give him the option of continuous treatment beyond first progression versus switching treatment to a different chemotherapy regimen. The patient and his wife are very interested in continuing his current treatment with immunotherapy  with Nivolumab and reevaluation with the next CT scan. The patient will proceed with cycle #14 today as scheduled. He would come back for follow-up visit in 2 weeks for evaluation before starting cycle #15. I will also discuss with the patient next visit consideration of treatment with Xgeva for his metastatic bone disease after receiving dental clearance. The patient was advised to call immediately if he has any concerning symptoms in the interval. The patient voices understanding of current disease status and treatment options and is in agreement with the current care plan.  All questions were answered. The patient knows to call the clinic with any problems, questions or concerns. We can certainly see the patient much sooner if necessary.  Disclaimer: This note was dictated with voice recognition software. Similar sounding words can inadvertently be transcribed and may not be corrected upon review.

## 2015-12-08 NOTE — Progress Notes (Signed)
[id number], Group A, Arm B3 Cycle 14/week 26 Patient here today for labs/MD/treatment and review of recent re-staging CT scan. Resting vitals obtained. O2 sat @ 98%, patient maintaining weight @ 146.4 lb (66.407 kg). Patient had recent over night hospital admission d/t altered mental status and was found to be dehydrated. Educated patient and spouse on ways to maintain good hydration. Patient reports to be doing well with no complaints. Patient denies having nausea or vomiting, denies headaches and per spouse patient has not had any more episodes of confusion or difficulty speaking since discharge from hospital and answered all questions without difficulty, patient denies pain but continues to have "ache" in right hip and R shoulder and states ache in shoulder from fishing vacation, reports to not take any pain medication. Patient denies SOB or changes in breathing, denies diarrhea, no rash or swelling present. Patient continues to report non worsening fatigue as well as baseline constipation. I reminded patient and spouse per discharge instructions on 12/14 to stop taking amiptriptyline at bedtime, medication was ordered by his PCP for appetite improvement. Informed patient and spouse that one of the common side effects to this med is confusion. Patient and spouse gave verbal understanding. MD reviewed patient's 12/14 CT scan with patient and spouse, per MD progress note "these findings are concerning for disease progression but the patient is currently asymptomatic and tolerating his treatment fairly well." After MD clinical assessment of patient, any A/E's, labs and NCS labs, recommended to continue treating patient for four more cycles and obtain another CT scan in 8 weeks for restaging to confirm whether this is true progression. Patient gave verbal consent to continue on study with Nivolumab treatment. EPIP [id number]) submitted to study and the medical monitor, Dr. Wilfred Curtis replied, via [place], agreeing with Dr.  Worthy Flank decision to treat beyond progression as long as the following caveats are met: "1)investigator-assessed clinical benefit and do not have rapid disease progression, 2) tolerance of study drug, 3) stable performance status, 4) treatment beyond progression will not delay an imminent intervention to prevent serious complications of disease progression (eg, CNS metastases), 5) A radiographic assessment/scan is performed within 8 weeks of original PD to determine whether there has been a decrease in tumor size, or continued PD" and requested that consent be obtained from patient for treatment beyond progression. Dr. Julien Nordmann reviewed caveats and in agreement. Per consent form Version 1.1 signed by patient on 06/03/2015  for study at [facility], consent allows for patient to continue with Nivolumab beyond disease progression with specific stipulations and patient does not need to re-consented.  Patient was informed that his Potassium level resulted at 3.4 and per MD, patient to increase potassium rich foods in his diet (an information sheet with potassium rich food suggestions given to patient), patient and spouse confirmed understanding. Patient also informed that his TSH elevated again and will continue to follow. Patient was told to call clinic, Dr. Julien Nordmann or myself with any new or unusual symptoms, patient gave verbal understanding. All patient's and spouses questions answered to their satisfaction, both deny further questions at this time. Study generated vial assignment given to PharmD Laddie Aquas, treatment infusion sign given to Kathe Becton, RN. Patient scheduled to return 12/23/2015 Adele Dan, RN, Clinical Research 12/08/2015 11:55 AM

## 2015-12-08 NOTE — Progress Notes (Signed)
Oncology Nurse Navigator Documentation  Oncology Nurse Navigator Flowsheets 12/08/2015  Navigator Encounter Type Other/spoke with patient and wife today.  He had an episode of confusion recently and I asked them to explain.  Wife was shaken up by this but is doing better.  Patient has no confusion today.  He will be continuing on his treatment today.   Patient Visit Type Medonc  Treatment Phase Treatment  Interventions Other  Time Spent with Patient 30

## 2015-12-08 NOTE — Telephone Encounter (Signed)
Gave pt appts for 1/5 + 1/18.

## 2015-12-08 NOTE — Telephone Encounter (Signed)
per pof to sch pt appt-pt appts already sch

## 2015-12-08 NOTE — Patient Instructions (Signed)
[facility] Discharge Instructions for Patients Receiving Chemotherapy  Today you received the following chemotherapy agents Opdivo.  To help prevent nausea and vomiting after your treatment, we encourage you to take your nausea medication.   If you develop nausea and vomiting that is not controlled by your nausea medication, call the clinic.   BELOW ARE SYMPTOMS THAT SHOULD BE REPORTED IMMEDIATELY:  *FEVER GREATER THAN 100.5 F  *CHILLS WITH OR WITHOUT FEVER  NAUSEA AND VOMITING THAT IS NOT CONTROLLED WITH YOUR NAUSEA MEDICATION  *UNUSUAL SHORTNESS OF BREATH  *UNUSUAL BRUISING OR BLEEDING  TENDERNESS IN MOUTH AND THROAT WITH OR WITHOUT PRESENCE OF ULCERS  *URINARY PROBLEMS  *BOWEL PROBLEMS  UNUSUAL RASH Items with * indicate a potential emergency and should be followed up as soon as possible.  Feel free to call the clinic you have any questions or concerns. The clinic phone number is [phone].  Please show the CHEMO ALERT CARD at check-in to the Emergency Department and triage nurse.   

## 2015-12-17 ENCOUNTER — Other Ambulatory Visit: Payer: Self-pay | Admitting: Medical Oncology

## 2015-12-17 ENCOUNTER — Encounter: Payer: Self-pay | Admitting: Medical Oncology

## 2015-12-17 DIAGNOSIS — C3411 Malignant neoplasm of upper lobe, right bronchus or lung: Secondary | ICD-10-CM

## 2015-12-17 DIAGNOSIS — Z79899 Other long term (current) drug therapy: Secondary | ICD-10-CM

## 2015-12-23 ENCOUNTER — Telehealth: Payer: Self-pay | Admitting: *Deleted

## 2015-12-23 ENCOUNTER — Other Ambulatory Visit (HOSPITAL_BASED_OUTPATIENT_CLINIC_OR_DEPARTMENT_OTHER): Payer: Medicare Other

## 2015-12-23 ENCOUNTER — Other Ambulatory Visit (HOSPITAL_COMMUNITY)
Admission: RE | Admit: 2015-12-23 | Discharge: 2015-12-23 | Disposition: A | Discharge status: Home / Self Care | Payer: Medicare Other | Source: Ambulatory Visit | Attending: Internal Medicine | Admitting: Internal Medicine

## 2015-12-23 ENCOUNTER — Encounter: Payer: Self-pay | Admitting: Internal Medicine

## 2015-12-23 ENCOUNTER — Encounter: Payer: Self-pay | Admitting: Medical Oncology

## 2015-12-23 ENCOUNTER — Telehealth: Payer: Self-pay | Admitting: Internal Medicine

## 2015-12-23 ENCOUNTER — Ambulatory Visit (HOSPITAL_BASED_OUTPATIENT_CLINIC_OR_DEPARTMENT_OTHER): Payer: Medicare Other | Admitting: Internal Medicine

## 2015-12-23 ENCOUNTER — Ambulatory Visit (HOSPITAL_BASED_OUTPATIENT_CLINIC_OR_DEPARTMENT_OTHER): Payer: Medicare Other

## 2015-12-23 VITALS — HR 90 | Temp 97.8°F | Resp 17 | Ht 66.0 in | Wt 146.2 lb

## 2015-12-23 DIAGNOSIS — C3411 Malignant neoplasm of upper lobe, right bronchus or lung: Secondary | ICD-10-CM

## 2015-12-23 DIAGNOSIS — Z5112 Encounter for antineoplastic immunotherapy: Secondary | ICD-10-CM

## 2015-12-23 DIAGNOSIS — Z006 Encounter for examination for normal comparison and control in clinical research program: Secondary | ICD-10-CM

## 2015-12-23 DIAGNOSIS — Z79899 Other long term (current) drug therapy: Secondary | ICD-10-CM

## 2015-12-23 DIAGNOSIS — C7951 Secondary malignant neoplasm of bone: Secondary | ICD-10-CM | POA: Diagnosis not present

## 2015-12-23 LAB — COMPREHENSIVE METABOLIC PANEL
ALT: 14 U/L (ref 0–55)
AST: 16 U/L (ref 5–34)
Albumin: 3.4 g/dL — ABNORMAL LOW (ref 3.5–5.0)
Alkaline Phosphatase: 95 U/L (ref 40–150)
Anion Gap: 9 mEq/L (ref 3–11)
BUN: 11.2 mg/dL (ref 7.0–26.0)
CHLORIDE: 103 meq/L (ref 98–109)
CO2: 28 mEq/L (ref 22–29)
Calcium: 9.7 mg/dL (ref 8.4–10.4)
Creatinine: 0.9 mg/dL (ref 0.7–1.3)
EGFR: 82 mL/min/{1.73_m2} — ABNORMAL LOW (ref >90)
GLUCOSE: 144 mg/dL — AB (ref 70–140)
POTASSIUM: 3.5 meq/L (ref 3.5–5.1)
SODIUM: 140 meq/L (ref 136–145)
Total Bilirubin: 0.42 mg/dL (ref 0.20–1.20)
Total Protein: 7.6 g/dL (ref 6.4–8.3)

## 2015-12-23 LAB — CBC WITH DIFFERENTIAL/PLATELET
BASO%: 0.7 % (ref 0.0–2.0)
BASOS ABS: 0.1 10*3/uL (ref 0.0–0.1)
EOS%: 5.4 % (ref 0.0–7.0)
Eosinophils Absolute: 0.5 10*3/uL (ref 0.0–0.5)
HCT: 42.2 % (ref 38.4–49.9)
HGB: 14 g/dL (ref 13.0–17.1)
LYMPH%: 30.8 % (ref 14.0–49.0)
MCH: 32.6 pg (ref 27.2–33.4)
MCHC: 33.2 g/dL (ref 32.0–36.0)
MCV: 98.1 fL — ABNORMAL HIGH (ref 79.3–98.0)
MONO#: 0.9 10*3/uL (ref 0.1–0.9)
MONO%: 10.7 % (ref 0.0–14.0)
NEUT#: 4.3 10*3/uL (ref 1.5–6.5)
NEUT%: 52.4 % (ref 39.0–75.0)
Platelets: 219 10*3/uL (ref 140–400)
RBC: 4.3 10*6/uL (ref 4.20–5.82)
RDW: 13.7 % (ref 11.0–14.6)
WBC: 8.3 10*3/uL (ref 4.0–10.3)
lymph#: 2.6 10*3/uL (ref 0.9–3.3)

## 2015-12-23 LAB — MAGNESIUM: Magnesium: 2 mg/dl (ref 1.5–2.5)

## 2015-12-23 LAB — TSH: TSH: 4.677 m[IU]/L — AB (ref 0.320–4.118)

## 2015-12-23 LAB — PHOSPHORUS: PHOSPHORUS: 3.3 mg/dL (ref 2.5–4.6)

## 2015-12-23 LAB — LACTATE DEHYDROGENASE: LDH: 137 U/L (ref 125–245)

## 2015-12-23 MED ORDER — SODIUM CHLORIDE 0.9 % IV SOLN
Freq: Once | INTRAVENOUS | Status: AC
  Administered 2015-12-23: 13:00:00 via INTRAVENOUS

## 2015-12-23 MED ORDER — SODIUM CHLORIDE 0.9 % IV SOLN
240.0000 mg | Freq: Once | INTRAVENOUS | Status: AC
  Administered 2015-12-23: 240 mg via INTRAVENOUS
  Filled 2015-12-23: qty 24

## 2015-12-23 NOTE — Progress Notes (Signed)
[facility] [phone]   Fax:([phone]) [place], MD [address] [place] [address]  DIAGNOSIS: Stage IV (T3, N3, M1b) non-small cell lung cancer, adenocarcinoma diagnosed in January 2016 presented with right upper lobe lung mass in addition to mediastinal lymphadenopathy and large right pleural effusion as well as bone metastasis.  PRIOR THERAPY:  1. Status post Pleurx catheter placement by Dr. Servando Snare for drainage of the right pleural effusion.  2. Systemic chemotherapy with carboplatin for AUC of 5 and Alimta 500 MG/M2 every 3 weeks. Status post 6 cycles.  CURRENT THERAPY: Patient is participating in the [place] 370 clinical trial and has been randomized to group A arm B-3 to receive Nivolumab 240 mg given every 2 weeks. First cycle expected 06/10/2015. Status post 14 cycles.  INTERVAL HISTORY: Omar Peters 81 y.o. male returns to the clinic today for follow-up visit accompanied by his wife. The patient is feeling fine today with no specific complaints except for fatigue. He is tolerating his treatment with Nivolumab fairly well with no significant adverse effects. He denied having any significant fever or chills. He has no nausea, vomiting, diarrhea or constipation. The patient denied having any significant skin rash. Has no chest pain, shortness breath, cough or hemoptysis. He continues to have mild pain in the right shoulder area. He is here today to start cycle #15 of his immunotherapy.  MEDICAL HISTORY: Past Medical History  Diagnosis Date  . Hypertension   . GERD (gastroesophageal reflux disease)   . Shortness of breath dyspnea     01/14/15 at night and has to sit up  . Anxiety     due to diagnosis of Stage 4 Lung Cancer  . Macular degeneration of right eye   . Cancer ([place])     Lung  . Diabetes mellitus without complication (HCC)     ALLERGIES:  is allergic to amitriptyline and  marinol.  MEDICATIONS:  Current Outpatient Prescriptions  Medication Sig Dispense Refill  . aspirin 81 MG tablet Take 81 mg by mouth daily.    . B Complex-Biotin-FA (B-COMPLEX PO) Take 1 each by mouth daily.    . metFORMIN (GLUCOPHAGE) 500 MG tablet Take 500 mg by mouth daily.    . Naproxen Sodium (ALEVE PO) Take 1 tablet by mouth daily as needed.    Marland Kitchen OVER THE COUNTER MEDICATION Apply 1 patch topically daily as needed.    . ranitidine (ZANTAC) 150 MG tablet Take 150 mg by mouth daily.    Marland Kitchen senna-docusate (SENNA S) 8.6-50 MG per tablet Take 2 tablets by mouth daily.    Marland Kitchen HYDROcodone-acetaminophen (NORCO) 5-325 MG per tablet Take 1 tablet by mouth every 6 (six) hours as needed for moderate pain. (Patient not taking: Reported on 12/08/2015) 30 tablet 0  . prochlorperazine (COMPAZINE) 10 MG tablet TAKE ONE TABLET BY MOUTH EVERY 6 HOURS AS NEEDED FOR NAUSEA AND VOMITING (Patient not taking: Reported on 12/08/2015) 30 tablet 0   No current facility-administered medications for this visit.    SURGICAL HISTORY:  Past Surgical History  Procedure Laterality Date  . Vein ligation and stripping  1977  . Chest tube insertion Right 01/15/2015    Procedure: INSERTION PLEURAL DRAINAGE CATHETER WITH ULTRASOUND AND FLURO GUIDANCE;  Surgeon: Grace Isaac, MD;  Location: [facility];  Service: Thoracic;  Laterality: Right;  . Removal of pleural drainage catheter Right 03/26/2015    Procedure: REMOVAL OF PLEURAL  DRAINAGE CATHETER;  Surgeon: Grace Isaac, MD;  Location: [facility] OR;  Service: Thoracic;  Laterality: Right;    REVIEW OF SYSTEMS:  A comprehensive review of systems was negative except for: Constitutional: positive for fatigue Musculoskeletal: positive for bone pain   PHYSICAL EXAMINATION: General appearance: alert, cooperative and no distress Head: Normocephalic, without obvious abnormality, atraumatic Neck: no adenopathy, no JVD, supple, symmetrical, trachea midline and thyroid not enlarged,  symmetric, no tenderness/mass/nodules Lymph nodes: Cervical, supraclavicular, and axillary nodes normal. Resp: clear to auscultation bilaterally Back: symmetric, no curvature. ROM normal. No CVA tenderness. Cardio: regular rate and rhythm, S1, S2 normal, no murmur, click, rub or gallop GI: soft, non-tender; bowel sounds normal; no masses,  no organomegaly Extremities: extremities normal, atraumatic, no cyanosis or edema Neurologic: Alert and oriented X 3, normal strength and tone. Normal symmetric reflexes. Normal coordination and gait  ECOG PERFORMANCE STATUS: 1 - Symptomatic but completely ambulatory  Pulse 90, temperature 97.8 F (36.6 C), temperature source Oral, resp. rate 17, height '5\' 6"'$  (1.676 m), weight 146 lb 3.2 oz (66.316 kg), SpO2 97 %.  LABORATORY DATA: Lab Results  Component Value Date   WBC 8.3 12/23/2015   HGB 14.0 12/23/2015   HCT 42.2 12/23/2015   MCV 98.1* 12/23/2015   PLT 219 12/23/2015      Chemistry      Component Value Date/Time   NA 140 12/23/2015 1100   NA 140 12/02/2015 0400   K 3.5 12/23/2015 1100   K 3.4* 12/02/2015 0400   CL 105 12/02/2015 0400   CO2 28 12/23/2015 1100   CO2 28 12/02/2015 0400   BUN 11.2 12/23/2015 1100   BUN 8 12/02/2015 0400   CREATININE 0.9 12/23/2015 1100   CREATININE 0.76 12/02/2015 0400   GLU 231* 03/10/2015 1539      Component Value Date/Time   CALCIUM 9.7 12/23/2015 1100   CALCIUM 8.8* 12/02/2015 0400   ALKPHOS 95 12/23/2015 1100   ALKPHOS 71 12/02/2015 0400   AST 16 12/23/2015 1100   AST 18 12/02/2015 0400   ALT 14 12/23/2015 1100   ALT 16* 12/02/2015 0400   BILITOT 0.42 12/23/2015 1100   BILITOT 0.7 12/02/2015 0400       RADIOGRAPHIC STUDIES: Ct Head Wo Contrast  12/01/2015  CLINICAL DATA:  Was speaking non since after waking up at 0230 hours today, unable to speak appropriately, stroke symptoms, history hypertension, diabetes mellitus, former smoker, RIGHT upper lobe lung cancer EXAM: CT HEAD WITHOUT  CONTRAST TECHNIQUE: Contiguous axial images were obtained from the base of the skull through the vertex without intravenous contrast. COMPARISON:  None; correlation MR brain 04/07/2015 FINDINGS: Generalized atrophy. Normal ventricular morphology. No midline shift or mass effect. Small vessel chronic ischemic changes of deep cerebral white matter. No intracranial hemorrhage, mass lesion, or acute infarction. Visualized paranasal sinuses and mastoid air cells clear. Bones unremarkable. Atherosclerotic calcification of internal carotid and vertebral arteries at skullbase. IMPRESSION: Atrophy with small vessel chronic ischemic changes of deep cerebral white matter. No acute intracranial abnormalities. Electronically Signed   By: Lavonia Dana M.D.   On: 12/01/2015 16:36   Ct Chest W Contrast  12/01/2015  CLINICAL DATA:  Metastatic lung cancer, ongoing chemotherapy. EXAM: CT CHEST, ABDOMEN, AND PELVIS WITH CONTRAST TECHNIQUE: Multidetector CT imaging of the chest, abdomen and pelvis was performed following the standard protocol during bolus administration of intravenous contrast. CONTRAST:  139m OMNIPAQUE IOHEXOL 300 MG/ML  SOLN COMPARISON:  10/06/2015. RECIST 1.1 Target Lesions: 1. Right upper  lobe mass, approximately 5.1 cm (series 4, image 20), difficult to measure due to heterogeneity and surrounding collapse/ consolidation, as well is pleural fluid. 2. Non-target Lesions: 1. AP window lymph node, 9 mm (image 23), previously 11 mm. 2. Subcarinal lymph node, present. 3. Multiple bone lesions, lytic, up to 3.9 cm (series 2, image 92), stable on axial imaging but increased on coronal imaging, as below. 4. Right pleural effusion, present. 5. Pre/paratracheal lymph nodes, present (image 23). 6. Left lower lobe pulmonary nodules, stable (image 38). FINDINGS: CT CHEST FINDINGS Mediastinum/Nodes: Mediastinal lymph nodes measure up to 8 mm in the lower right paratracheal station (series 2, image 23), stable. No left hilar  or axillary adenopathy. Soft tissue thickening in the right hilum, stable. Three-vessel coronary artery calcification. Heart size normal. No pericardial effusion. Lungs/Pleura: A small amount of loculated pleural fluid is seen in the upper and lower aspects of the right hemi thorax, stable. Heterogeneous masslike consolidation in the right upper lobe appears larger, however there is surrounding collapse/ consolidation. Approximate measurement is 5.1 cm in longest transverse dimension (series 4, image 20), previously 3.8 cm. Posttreatment changes are seen in the surrounding right lung. Septal thickening, ground-glass and subpleural consolidation in the right lower lobe, as before. Centrilobular emphysema. Minimal peribronchovascular ground-glass nodularity in the left lower lobe (image 37), stable. Musculoskeletal: Question slight lytic destruction of the right scapula, at the glenoid (series 4, image 11). Mildly sclerotic lesion in the T11 vertebral body is unchanged. Pretracheal nodule measures 1.5 cm (image 7), decreased from 1.7 cm. CT ABDOMEN PELVIS FINDINGS Hepatobiliary: The liver margin is irregular. Liver and gallbladder are otherwise unremarkable. Extrahepatic bile duct measures up to 13 mm stable. Pancreas: Negative. Spleen: Negative. Adrenals/Urinary Tract: Adrenal glands are unremarkable. 7 mm low-attenuation lesion in the right kidney is too small to characterize but stable and statistically, likely represents a cyst. Kidneys are otherwise unremarkable. Ureters are decompressed. Bladder is grossly unremarkable. Stomach/Bowel: Stomach, small bowel, appendix and colon are unremarkable. Vascular/Lymphatic: Atherosclerotic calcification of the arterial vasculature without abdominal aortic aneurysm. Recanalized paraumbilical vein. No pathologically enlarged lymph nodes. Reproductive: Prostate is mildly enlarged. Other: Small right inguinal hernia contains fat. No free fluid. Tiny periumbilical hernia  contains fat. Mesenteries and peritoneum are unremarkable. Musculoskeletal: Lytic lesion in the right iliac wing measures up to 4.2 cm (coronal image 61), increased from 3.5 cm. A lytic in the adjacent right iliac wing has enlarged, with a larger soft tissue component, measuring 1.1 x 1.7 cm (Series 2, image 92), previously 0.9 x 1.2 cm. IMPRESSION: 1. Right upper lobe mass is difficult to accurately measure but appears larger than on 10/06/2015, worrisome for disease progression. 2. Progression of osseous metastatic disease with a suspected new lesion in the right glenoid and enlarging lesions in the right iliac wing. 3. Three-vessel coronary artery calcification. 4. Cirrhosis. 5. Enlarged prostate. Electronically Signed   By: Lorin Picket M.D.   On: 12/01/2015 09:36   Mr Brain Wo Contrast  12/01/2015  CLINICAL DATA:  Acute onset of aphasia beginning at 2:30 a.m. today. Speech has since returned. The examination had to be discontinued prior to completion due to the patient becoming combative and refusing further imaging. EXAM: MRI HEAD WITHOUT CONTRAST TECHNIQUE: Multiplanar, multiecho pulse sequences of the brain and surrounding structures were obtained without intravenous contrast. COMPARISON:  CT head without contrast 12/01/2015. FINDINGS: The diffusion-weighted images demonstrate no evidence for acute or subacute infarction. Moderate atrophy and white matter disease is similar to the prior exam.  No acute hemorrhage or mass lesion is present. The ventricles are proportionate to the degree of atrophy. The internal auditory canals are within normal limits. The brainstem and cerebellum are normal. Flow is present in the major intracranial arteries. A right lens replacement is present. The globes and orbits are otherwise intact. The paranasal sinuses and mastoid air cells are clear. Midline sagittal images are within normal limits. IMPRESSION: 1. No acute infarct. 2. Stable atrophy and white matter disease.  This likely reflects the sequela of chronic microvascular ischemia. Electronically Signed   By: San Morelle M.D.   On: 12/01/2015 18:20   Ct Abdomen Pelvis W Contrast  12/01/2015  CLINICAL DATA:  Metastatic lung cancer, ongoing chemotherapy. EXAM: CT CHEST, ABDOMEN, AND PELVIS WITH CONTRAST TECHNIQUE: Multidetector CT imaging of the chest, abdomen and pelvis was performed following the standard protocol during bolus administration of intravenous contrast. CONTRAST:  121m OMNIPAQUE IOHEXOL 300 MG/ML  SOLN COMPARISON:  10/06/2015. RECIST 1.1 Target Lesions: 1. Right upper lobe mass, approximately 5.1 cm (series 4, image 20), difficult to measure due to heterogeneity and surrounding collapse/ consolidation, as well is pleural fluid. 2. Non-target Lesions: 1. AP window lymph node, 9 mm (image 23), previously 11 mm. 2. Subcarinal lymph node, present. 3. Multiple bone lesions, lytic, up to 3.9 cm (series 2, image 92), stable on axial imaging but increased on coronal imaging, as below. 4. Right pleural effusion, present. 5. Pre/paratracheal lymph nodes, present (image 23). 6. Left lower lobe pulmonary nodules, stable (image 38). FINDINGS: CT CHEST FINDINGS Mediastinum/Nodes: Mediastinal lymph nodes measure up to 8 mm in the lower right paratracheal station (series 2, image 23), stable. No left hilar or axillary adenopathy. Soft tissue thickening in the right hilum, stable. Three-vessel coronary artery calcification. Heart size normal. No pericardial effusion. Lungs/Pleura: A small amount of loculated pleural fluid is seen in the upper and lower aspects of the right hemi thorax, stable. Heterogeneous masslike consolidation in the right upper lobe appears larger, however there is surrounding collapse/ consolidation. Approximate measurement is 5.1 cm in longest transverse dimension (series 4, image 20), previously 3.8 cm. Posttreatment changes are seen in the surrounding right lung. Septal thickening,  ground-glass and subpleural consolidation in the right lower lobe, as before. Centrilobular emphysema. Minimal peribronchovascular ground-glass nodularity in the left lower lobe (image 37), stable. Musculoskeletal: Question slight lytic destruction of the right scapula, at the glenoid (series 4, image 11). Mildly sclerotic lesion in the T11 vertebral body is unchanged. Pretracheal nodule measures 1.5 cm (image 7), decreased from 1.7 cm. CT ABDOMEN PELVIS FINDINGS Hepatobiliary: The liver margin is irregular. Liver and gallbladder are otherwise unremarkable. Extrahepatic bile duct measures up to 13 mm stable. Pancreas: Negative. Spleen: Negative. Adrenals/Urinary Tract: Adrenal glands are unremarkable. 7 mm low-attenuation lesion in the right kidney is too small to characterize but stable and statistically, likely represents a cyst. Kidneys are otherwise unremarkable. Ureters are decompressed. Bladder is grossly unremarkable. Stomach/Bowel: Stomach, small bowel, appendix and colon are unremarkable. Vascular/Lymphatic: Atherosclerotic calcification of the arterial vasculature without abdominal aortic aneurysm. Recanalized paraumbilical vein. No pathologically enlarged lymph nodes. Reproductive: Prostate is mildly enlarged. Other: Small right inguinal hernia contains fat. No free fluid. Tiny periumbilical hernia contains fat. Mesenteries and peritoneum are unremarkable. Musculoskeletal: Lytic lesion in the right iliac wing measures up to 4.2 cm (coronal image 61), increased from 3.5 cm. A lytic in the adjacent right iliac wing has enlarged, with a larger soft tissue component, measuring 1.1 x 1.7 cm (Series 2, image  92), previously 0.9 x 1.2 cm. IMPRESSION: 1. Right upper lobe mass is difficult to accurately measure but appears larger than on 10/06/2015, worrisome for disease progression. 2. Progression of osseous metastatic disease with a suspected new lesion in the right glenoid and enlarging lesions in the right  iliac wing. 3. Three-vessel coronary artery calcification. 4. Cirrhosis. 5. Enlarged prostate. Electronically Signed   By: Lorin Picket M.D.   On: 12/01/2015 09:36    ASSESSMENT AND PLAN: This is a very pleasant 81 years old white male with history of stage IV non-small cell lung cancer, adenocarcinoma status post induction chemotherapy was carboplatin and Alimta and currently undergoing treatment with immunotherapy according to the [facility] 370 clinical trial. He was randomized to the Nivolumab arm 240 mg IV every 2 weeks. He is status post 14 cycles and tolerating his treatment fairly well. The patient will proceed with cycle #15 today as scheduled. He would come back for follow-up visit in 2 weeks for evaluation before starting cycle #16. The patient was advised to call immediately if he has any concerning symptoms in the interval. The patient voices understanding of current disease status and treatment options and is in agreement with the current care plan.  All questions were answered. The patient knows to call the clinic with any problems, questions or concerns. We can certainly see the patient much sooner if necessary.  Disclaimer: This note was dictated with voice recognition software. Similar sounding words can inadvertently be transcribed and may not be corrected upon review.

## 2015-12-23 NOTE — Progress Notes (Signed)
[facility] 370: Group A, Arm B3 Cycle 15/Week 28 Patient here today with spouse for the labs, MD visit and to start cycle 15 of Nivolumab. Resting vital signs completed. O2 saturation at 97% and patient maintaining weight. I met with patient and spouse and reviewed concomitant medications. Patient reports to have started taking OTC naproxen, Aleve, for his reported right shoulder ache. Patient states he has only taken 2 tablets over two days, reports ache is aggravated mostly when he reaches for something, states ache at worse is a 3 on a pain scale of 10. Patient reports icy hot patches on shoulder helps the ache the most. Patient denies any other  Issues or concerns. He denies having any other pain, denies n/v, denies new or worsening SOB, no diarrhea and continues with his baseline constipation, denies cough or wheezing, no rashes or swelling to extremities. Denies neuro problems or headaches. Patient does report his energy level is good "not worse." Patient and spouse also report that patient has had no more episodes of confusion and patient has been trying to maintain adequate fluid intake. Per Dr. Worthy Flank assessment of patient and review of all labs, including NCS labs, patient is clear to proceed with Cycle 15 today. Patient's TSH resulted in just above normal, today down to 4.677 from 7.360, and will continue to monitor, Per MD.  Per MD,  no Kandra Nicolas or FreeT4 needed,  Patient asymptomatic and last 4 cycles of Free T3 and Free T4 have been within normal range. Patient informed of lab results. All patient's and spouse's questions answered to their satisfaction. I thanked patient and spouse for their continued support of study and encouraged them to call Dr. Julien Nordmann or myself with any questions, concerns or new symptoms that he may be experiencing.  Study generated vial assignment provided to PharmD Lattie Haw Halcomb. Infusion treatment sign given to [facility] with no questions. Adele Dan, RN, BSN Clinical  Research 12/23/2015 2:27 PM

## 2015-12-23 NOTE — Telephone Encounter (Signed)
per pof to sch pt appt-sent MW email to sch trmt-pt to get updated copy of avs b4 leaving

## 2015-12-23 NOTE — Patient Instructions (Signed)
[facility] Discharge Instructions for Patients Receiving Chemotherapy  Today you received the following chemotherapy agents:  Nivolumab.  To help prevent nausea and vomiting after your treatment, we encourage you to take your nausea medication as directed.   If you develop nausea and vomiting that is not controlled by your nausea medication, call the clinic.   BELOW ARE SYMPTOMS THAT SHOULD BE REPORTED IMMEDIATELY:  *FEVER GREATER THAN 100.5 F  *CHILLS WITH OR WITHOUT FEVER  NAUSEA AND VOMITING THAT IS NOT CONTROLLED WITH YOUR NAUSEA MEDICATION  *UNUSUAL SHORTNESS OF BREATH  *UNUSUAL BRUISING OR BLEEDING  TENDERNESS IN MOUTH AND THROAT WITH OR WITHOUT PRESENCE OF ULCERS  *URINARY PROBLEMS  *BOWEL PROBLEMS  UNUSUAL RASH Items with * indicate a potential emergency and should be followed up as soon as possible.  Feel free to call the clinic you have any questions or concerns. The clinic phone number is [phone].  Please show the CHEMO ALERT CARD at check-in to the Emergency Department and triage nurse.   

## 2015-12-23 NOTE — Telephone Encounter (Signed)
Per staff message and POF I have scheduled appts. Advised scheduler of appts. JMW  

## 2016-01-04 ENCOUNTER — Other Ambulatory Visit: Payer: Self-pay | Admitting: Medical Oncology

## 2016-01-04 DIAGNOSIS — C3411 Malignant neoplasm of upper lobe, right bronchus or lung: Secondary | ICD-10-CM

## 2016-01-04 DIAGNOSIS — Z79899 Other long term (current) drug therapy: Secondary | ICD-10-CM

## 2016-01-05 ENCOUNTER — Ambulatory Visit (HOSPITAL_BASED_OUTPATIENT_CLINIC_OR_DEPARTMENT_OTHER): Payer: Medicare Other | Admitting: Internal Medicine

## 2016-01-05 ENCOUNTER — Encounter: Payer: Self-pay | Admitting: Oncology

## 2016-01-05 ENCOUNTER — Encounter: Payer: Self-pay | Admitting: Medical Oncology

## 2016-01-05 ENCOUNTER — Telehealth: Payer: Self-pay | Admitting: Internal Medicine

## 2016-01-05 ENCOUNTER — Other Ambulatory Visit (HOSPITAL_BASED_OUTPATIENT_CLINIC_OR_DEPARTMENT_OTHER): Payer: Medicare Other

## 2016-01-05 ENCOUNTER — Ambulatory Visit (HOSPITAL_BASED_OUTPATIENT_CLINIC_OR_DEPARTMENT_OTHER): Payer: Medicare Other

## 2016-01-05 ENCOUNTER — Other Ambulatory Visit (HOSPITAL_COMMUNITY)
Admission: RE | Admit: 2016-01-05 | Discharge: 2016-01-05 | Disposition: A | Discharge status: Home / Self Care | Payer: Medicare Other | Source: Ambulatory Visit | Attending: Internal Medicine | Admitting: Internal Medicine

## 2016-01-05 ENCOUNTER — Encounter: Payer: Self-pay | Admitting: Internal Medicine

## 2016-01-05 VITALS — BP 182/86 | HR 97 | Temp 98.4°F | Resp 18 | Ht 66.0 in | Wt 149.0 lb

## 2016-01-05 DIAGNOSIS — Z5112 Encounter for antineoplastic immunotherapy: Secondary | ICD-10-CM

## 2016-01-05 DIAGNOSIS — Z029 Encounter for administrative examinations, unspecified: Secondary | ICD-10-CM | POA: Insufficient documentation

## 2016-01-05 DIAGNOSIS — C3411 Malignant neoplasm of upper lobe, right bronchus or lung: Secondary | ICD-10-CM

## 2016-01-05 DIAGNOSIS — Z006 Encounter for examination for normal comparison and control in clinical research program: Secondary | ICD-10-CM

## 2016-01-05 DIAGNOSIS — C7951 Secondary malignant neoplasm of bone: Secondary | ICD-10-CM

## 2016-01-05 DIAGNOSIS — Z79899 Other long term (current) drug therapy: Secondary | ICD-10-CM

## 2016-01-05 LAB — CBC WITH DIFFERENTIAL/PLATELET
BASO%: 1.2 % (ref 0.0–2.0)
Basophils Absolute: 0.1 10*3/uL (ref 0.0–0.1)
EOS%: 5 % (ref 0.0–7.0)
Eosinophils Absolute: 0.4 10*3/uL (ref 0.0–0.5)
HCT: 42.5 % (ref 38.4–49.9)
HGB: 14.4 g/dL (ref 13.0–17.1)
LYMPH#: 1.5 10*3/uL (ref 0.9–3.3)
LYMPH%: 18.8 % (ref 14.0–49.0)
MCH: 33.2 pg (ref 27.2–33.4)
MCHC: 33.9 g/dL (ref 32.0–36.0)
MCV: 97.9 fL (ref 79.3–98.0)
MONO#: 0.7 10*3/uL (ref 0.1–0.9)
MONO%: 9.5 % (ref 0.0–14.0)
NEUT#: 5.1 10*3/uL (ref 1.5–6.5)
NEUT%: 65.5 % (ref 39.0–75.0)
Platelets: 194 10*3/uL (ref 140–400)
RBC: 4.34 10*6/uL (ref 4.20–5.82)
RDW: 13.6 % (ref 11.0–14.6)
WBC: 7.8 10*3/uL (ref 4.0–10.3)

## 2016-01-05 LAB — COMPREHENSIVE METABOLIC PANEL
ALT: 15 U/L (ref 0–55)
AST: 17 U/L (ref 5–34)
Albumin: 3.2 g/dL — ABNORMAL LOW (ref 3.5–5.0)
Alkaline Phosphatase: 91 U/L (ref 40–150)
Anion Gap: 9 mEq/L (ref 3–11)
BUN: 11.7 mg/dL (ref 7.0–26.0)
CHLORIDE: 105 meq/L (ref 98–109)
CO2: 26 meq/L (ref 22–29)
CREATININE: 0.9 mg/dL (ref 0.7–1.3)
Calcium: 9.6 mg/dL (ref 8.4–10.4)
EGFR: 83 mL/min/{1.73_m2} — ABNORMAL LOW (ref >90)
Glucose: 176 mg/dl — ABNORMAL HIGH (ref 70–140)
Potassium: 3.7 mEq/L (ref 3.5–5.1)
Sodium: 140 mEq/L (ref 136–145)
Total Bilirubin: 0.34 mg/dL (ref 0.20–1.20)
Total Protein: 7.2 g/dL (ref 6.4–8.3)

## 2016-01-05 LAB — LACTATE DEHYDROGENASE: LDH: 135 U/L (ref 125–245)

## 2016-01-05 LAB — MAGNESIUM: Magnesium: 2.3 mg/dl (ref 1.5–2.5)

## 2016-01-05 LAB — TSH: TSH: 4.622 m[IU]/L — AB (ref 0.320–4.118)

## 2016-01-05 LAB — PHOSPHORUS: PHOSPHORUS: 3.1 mg/dL (ref 2.5–4.6)

## 2016-01-05 MED ORDER — SODIUM CHLORIDE 0.9 % IV SOLN
240.0000 mg | Freq: Once | INTRAVENOUS | Status: AC
  Administered 2016-01-05: 240 mg via INTRAVENOUS
  Filled 2016-01-05: qty 24

## 2016-01-05 MED ORDER — SODIUM CHLORIDE 0.9 % IV SOLN
Freq: Once | INTRAVENOUS | Status: AC
  Administered 2016-01-05: 11:00:00 via INTRAVENOUS

## 2016-01-05 NOTE — Progress Notes (Signed)
01/05/16 - [facility] [id number] - Questionnaires (PRO's) - Patient into the cancer center for routine visit.  Patient given PRO's upon arrival to the cancer center.  Patient completed the PRO's ([id number] first and then the FACT-L) before any study procedures were done.  The PRO's were checked for completeness.  The patient was thanked for his continued support in this clinical trial. Omar Peters 01/05/16 - 9:30 am

## 2016-01-05 NOTE — Progress Notes (Signed)
[facility] [phone]   Fax:(336) [place], MD [address] [place] [address]  DIAGNOSIS: Stage IV (T3, N3, M1b) non-small cell lung cancer, adenocarcinoma diagnosed in January 2016 presented with right upper lobe lung mass in addition to mediastinal lymphadenopathy and large right pleural effusion as well as bone metastasis.  PRIOR THERAPY:  1. Status post Pleurx catheter placement by Dr. Servando Snare for drainage of the right pleural effusion.  2. Systemic chemotherapy with carboplatin for AUC of 5 and Alimta 500 MG/M2 every 3 weeks. Status post 6 cycles.  CURRENT THERAPY: Patient is participating in the [place] 370 clinical trial and has been randomized to group A arm B-3 to receive Nivolumab 240 mg given every 2 weeks. First cycle expected 06/10/2015. Status post 14 cycles.  INTERVAL HISTORY: Omar Peters 81 y.o. male returns to the clinic today for follow-up visit accompanied by his wife. The patient is feeling fine today with no specific complaints except for aching pain in the right shoulder and back after a fishing trip several weeks ago. He is tolerating his treatment with Nivolumab fairly well with no significant adverse effects. He denied having any significant fever or chills. He has no nausea, vomiting, diarrhea or constipation. The patient denied having any significant skin rash. He has no chest pain, shortness of breath, cough or hemoptysis. He is here today to start cycle #16 of his immunotherapy.  MEDICAL HISTORY: Past Medical History  Diagnosis Date  . Hypertension   . GERD (gastroesophageal reflux disease)   . Shortness of breath dyspnea     01/14/15 at night and has to sit up  . Anxiety     due to diagnosis of Stage 4 Lung Cancer  . Macular degeneration of right eye   . Cancer ([facility])     Lung  . Diabetes mellitus without complication (HCC)     ALLERGIES:  is allergic to amitriptyline  and marinol.  MEDICATIONS:  Current Outpatient Prescriptions  Medication Sig Dispense Refill  . aspirin 81 MG tablet Take 81 mg by mouth daily.    . B Complex-Biotin-FA (B-COMPLEX PO) Take 1 each by mouth daily.    . metFORMIN (GLUCOPHAGE) 500 MG tablet Take 500 mg by mouth daily.    . Naproxen Sodium (ALEVE PO) Take 1 tablet by mouth daily as needed.    . ranitidine (ZANTAC) 150 MG tablet Take 150 mg by mouth daily.    Marland Kitchen senna-docusate (SENNA S) 8.6-50 MG per tablet Take 2 tablets by mouth daily.    Marland Kitchen HYDROcodone-acetaminophen (NORCO) 5-325 MG per tablet Take 1 tablet by mouth every 6 (six) hours as needed for moderate pain. (Patient not taking: Reported on 12/08/2015) 30 tablet 0  . OVER THE COUNTER MEDICATION Apply 1 patch topically daily as needed. Reported on 01/05/2016    . prochlorperazine (COMPAZINE) 10 MG tablet TAKE ONE TABLET BY MOUTH EVERY 6 HOURS AS NEEDED FOR NAUSEA AND VOMITING (Patient not taking: Reported on 12/08/2015) 30 tablet 0   No current facility-administered medications for this visit.    SURGICAL HISTORY:  Past Surgical History  Procedure Laterality Date  . Vein ligation and stripping  1977  . Chest tube insertion Right 01/15/2015    Procedure: INSERTION PLEURAL DRAINAGE CATHETER WITH ULTRASOUND AND FLURO GUIDANCE;  Surgeon: Grace Isaac, MD;  Location: [facility];  Service: Thoracic;  Laterality: Right;  . Removal of pleural drainage catheter Right  03/26/2015    Procedure: REMOVAL OF PLEURAL DRAINAGE CATHETER;  Surgeon: Grace Isaac, MD;  Location: [facility];  Service: Thoracic;  Laterality: Right;    REVIEW OF SYSTEMS:  A comprehensive review of systems was negative except for: Musculoskeletal: positive for arthralgias and bone pain   PHYSICAL EXAMINATION: General appearance: alert, cooperative and no distress Head: Normocephalic, without obvious abnormality, atraumatic Neck: no adenopathy, no JVD, supple, symmetrical, trachea midline and thyroid not enlarged,  symmetric, no tenderness/mass/nodules Lymph nodes: Cervical, supraclavicular, and axillary nodes normal. Resp: clear to auscultation bilaterally Back: symmetric, no curvature. ROM normal. No CVA tenderness. Cardio: regular rate and rhythm, S1, S2 normal, no murmur, click, rub or gallop GI: soft, non-tender; bowel sounds normal; no masses,  no organomegaly Extremities: extremities normal, atraumatic, no cyanosis or edema Neurologic: Alert and oriented X 3, normal strength and tone. Normal symmetric reflexes. Normal coordination and gait  ECOG PERFORMANCE STATUS: 1 - Symptomatic but completely ambulatory  Blood pressure 182/86, pulse 97, temperature 98.4 F (36.9 C), temperature source Oral, resp. rate 18, height '5\' 6"'$  (1.676 m), weight 149 lb (67.586 kg), SpO2 96 %.  LABORATORY DATA: Lab Results  Component Value Date   WBC 7.8 01/05/2016   HGB 14.4 01/05/2016   HCT 42.5 01/05/2016   MCV 97.9 01/05/2016   PLT 194 01/05/2016      Chemistry      Component Value Date/Time   NA 140 01/05/2016 0920   NA 140 12/02/2015 0400   K 3.7 01/05/2016 0920   K 3.4* 12/02/2015 0400   CL 105 12/02/2015 0400   CO2 26 01/05/2016 0920   CO2 28 12/02/2015 0400   BUN 11.7 01/05/2016 0920   BUN 8 12/02/2015 0400   CREATININE 0.9 01/05/2016 0920   CREATININE 0.76 12/02/2015 0400   GLU 231* 03/10/2015 1539      Component Value Date/Time   CALCIUM 9.6 01/05/2016 0920   CALCIUM 8.8* 12/02/2015 0400   ALKPHOS 91 01/05/2016 0920   ALKPHOS 71 12/02/2015 0400   AST 17 01/05/2016 0920   AST 18 12/02/2015 0400   ALT 15 01/05/2016 0920   ALT 16* 12/02/2015 0400   BILITOT 0.34 01/05/2016 0920   BILITOT 0.7 12/02/2015 0400       RADIOGRAPHIC STUDIES: No results found.  ASSESSMENT AND PLAN: This is a very pleasant 81 years old white male with history of stage IV non-small cell lung cancer, adenocarcinoma status post induction chemotherapy was carboplatin and Alimta and currently undergoing  treatment with immunotherapy according to the [facility]MS checkmated 370 clinical trial. He was randomized to the Nivolumab arm 240 mg IV every 2 weeks. He is status post 15 cycles and tolerating his treatment fairly well. The patient will proceed with cycle #16 today as scheduled. He would come back for follow-up visit in 2 weeks for evaluation before starting cycle #17. He would have repeat CT scan of the chest, abdomen and pelvis on 01/26/2016 for restaging of his disease. The patient was advised to call immediately if he has any concerning symptoms in the interval. The patient voices understanding of current disease status and treatment options and is in agreement with the current care plan.  All questions were answered. The patient knows to call the clinic with any problems, questions or concerns. We can certainly see the patient much sooner if necessary.  Disclaimer: This note was dictated with voice recognition software. Similar sounding words can inadvertently be transcribed and may not be corrected upon review.

## 2016-01-05 NOTE — Patient Instructions (Signed)
[facility] Discharge Instructions for Patients Receiving Chemotherapy  Today you received the following chemotherapy agents Nivolumab.   To help prevent nausea and vomiting after your treatment, we encourage you to take your nausea medications as directed.     If you develop nausea and vomiting that is not controlled by your nausea medication, call the clinic.   BELOW ARE SYMPTOMS THAT SHOULD BE REPORTED IMMEDIATELY:  *FEVER GREATER THAN 100.5 F  *CHILLS WITH OR WITHOUT FEVER  NAUSEA AND VOMITING THAT IS NOT CONTROLLED WITH YOUR NAUSEA MEDICATION  *UNUSUAL SHORTNESS OF BREATH  *UNUSUAL BRUISING OR BLEEDING  TENDERNESS IN MOUTH AND THROAT WITH OR WITHOUT PRESENCE OF ULCERS  *URINARY PROBLEMS  *BOWEL PROBLEMS  UNUSUAL RASH Items with * indicate a potential emergency and should be followed up as soon as possible.  Feel free to call the clinic you have any questions or concerns. The clinic phone number is [phone].  Please show the [place] at check-in to the Emergency Department and triage nurse.

## 2016-01-05 NOTE — Progress Notes (Signed)
[id number] Group A, Arm B3: Cycle 16/week30 Patient in clinic with spouse today, for labs, MD and cycle 16 treatment. PRO's completed with research assistant Remer Macho, prior to patient's lab appointment. I met with patient and spouse during vitals assessment. Patients B/P elevated initially @ 182/86  and this RN retook B/P again after patient had some time to sit and relax, with B/P down to 165/82. Patient and spouse were encouraged to follow-up with PCP again and discuss elevated B/P, spouse gave verbal confirmation. Patient reports no changes to his concomitant medications. Patient denies any nausea or vomiting, denies diarrhea but does continue with his baseline constipation. Patient reports energy level "about the same" and not worse. Patient continues to report no appetite and states he has to make himself to eat, he has had a 3 pound weight gain since his last visit. He denies any skin rashes, denies headache or worsening fatigue. No edema present to extremities. Patient reports to be having occasional SOB upon waking up in morning and states that this has not been consistent, something he has recently noticed. Patient continues to report ache to Right shoulder and states has "some lower back" soreness that he attributes a previous fishing trip and some recent lifting. Patient reports no other symptoms or concerns today. Per Dr. Worthy Flank physical assessment of patient and review of patient's labs and NCS labs, patient cleared to proceed with Cycle 16 of Nivolumab today. Patient was informed that his next CT scan will be scheduled for 01/26/16. All patient's and spouse's questions answered to their satisfaction. They were both encouraged to contact clinic with any questions/concerns. I thanked patient for their continued support of study. Study generated vial assignment given to Pharmacist Raul Del. Infusion treatment sign directions given to Mayra Reel, RN, with no questions. Adele Dan, RN,  BSN Clinical Research 01/05/2016 11:03 AM

## 2016-01-05 NOTE — Telephone Encounter (Signed)
Gv pt appts for Feb 2017.

## 2016-01-06 LAB — T4, FREE: T4,Free(Direct): 1.02 ng/dL (ref 0.82–1.77)

## 2016-01-06 LAB — T3, FREE: Triiodothyronine,Free,Serum: 2.8 pg/mL (ref 2.0–4.4)

## 2016-01-18 ENCOUNTER — Other Ambulatory Visit: Payer: Self-pay | Admitting: Medical Oncology

## 2016-01-18 DIAGNOSIS — C3411 Malignant neoplasm of upper lobe, right bronchus or lung: Secondary | ICD-10-CM

## 2016-01-19 ENCOUNTER — Ambulatory Visit (HOSPITAL_BASED_OUTPATIENT_CLINIC_OR_DEPARTMENT_OTHER): Payer: Medicare Other

## 2016-01-19 ENCOUNTER — Encounter: Payer: Self-pay | Admitting: *Deleted

## 2016-01-19 ENCOUNTER — Encounter: Payer: Self-pay | Admitting: Medical Oncology

## 2016-01-19 ENCOUNTER — Encounter: Payer: Self-pay | Admitting: Internal Medicine

## 2016-01-19 ENCOUNTER — Ambulatory Visit (HOSPITAL_BASED_OUTPATIENT_CLINIC_OR_DEPARTMENT_OTHER): Payer: Medicare Other | Admitting: Internal Medicine

## 2016-01-19 ENCOUNTER — Other Ambulatory Visit (HOSPITAL_BASED_OUTPATIENT_CLINIC_OR_DEPARTMENT_OTHER): Payer: Medicare Other

## 2016-01-19 ENCOUNTER — Other Ambulatory Visit (HOSPITAL_COMMUNITY)
Admission: RE | Admit: 2016-01-19 | Discharge: 2016-01-19 | Disposition: A | Discharge status: Home / Self Care | Payer: Medicare Other | Source: Ambulatory Visit | Attending: Internal Medicine | Admitting: Internal Medicine

## 2016-01-19 VITALS — BP 147/70 | HR 103 | Temp 97.8°F | Resp 18 | Wt 145.6 lb

## 2016-01-19 DIAGNOSIS — Z006 Encounter for examination for normal comparison and control in clinical research program: Secondary | ICD-10-CM | POA: Diagnosis not present

## 2016-01-19 DIAGNOSIS — Z5112 Encounter for antineoplastic immunotherapy: Secondary | ICD-10-CM

## 2016-01-19 DIAGNOSIS — C7951 Secondary malignant neoplasm of bone: Secondary | ICD-10-CM | POA: Diagnosis not present

## 2016-01-19 DIAGNOSIS — C3411 Malignant neoplasm of upper lobe, right bronchus or lung: Secondary | ICD-10-CM

## 2016-01-19 DIAGNOSIS — C3431 Malignant neoplasm of lower lobe, right bronchus or lung: Secondary | ICD-10-CM | POA: Insufficient documentation

## 2016-01-19 LAB — CBC WITH DIFFERENTIAL/PLATELET
BASO%: 0.2 % (ref 0.0–2.0)
Basophils Absolute: 0 10*3/uL (ref 0.0–0.1)
EOS%: 4.3 % (ref 0.0–7.0)
Eosinophils Absolute: 0.4 10*3/uL (ref 0.0–0.5)
HCT: 48.6 % (ref 38.4–49.9)
HGB: 16.2 g/dL (ref 13.0–17.1)
LYMPH#: 2.2 10*3/uL (ref 0.9–3.3)
LYMPH%: 24.1 % (ref 14.0–49.0)
MCH: 33 pg (ref 27.2–33.4)
MCHC: 33.4 g/dL (ref 32.0–36.0)
MCV: 99 fL — AB (ref 79.3–98.0)
MONO#: 1.1 10*3/uL — ABNORMAL HIGH (ref 0.1–0.9)
MONO%: 11.5 % (ref 0.0–14.0)
NEUT#: 5.5 10*3/uL (ref 1.5–6.5)
NEUT%: 59.9 % (ref 39.0–75.0)
Platelets: 232 10*3/uL (ref 140–400)
RBC: 4.91 10*6/uL (ref 4.20–5.82)
RDW: 13.6 % (ref 11.0–14.6)
WBC: 9.2 10*3/uL (ref 4.0–10.3)

## 2016-01-19 LAB — COMPREHENSIVE METABOLIC PANEL
ALBUMIN: 3.3 g/dL — AB (ref 3.5–5.0)
ALT: 16 U/L (ref 0–55)
AST: 18 U/L (ref 5–34)
Alkaline Phosphatase: 103 U/L (ref 40–150)
Anion Gap: 10 mEq/L (ref 3–11)
BILIRUBIN TOTAL: 0.5 mg/dL (ref 0.20–1.20)
BUN: 10.1 mg/dL (ref 7.0–26.0)
CALCIUM: 9.8 mg/dL (ref 8.4–10.4)
CHLORIDE: 102 meq/L (ref 98–109)
CO2: 29 mEq/L (ref 22–29)
CREATININE: 0.9 mg/dL (ref 0.7–1.3)
EGFR: 82 mL/min/{1.73_m2} — ABNORMAL LOW (ref >90)
Glucose: 116 mg/dl (ref 70–140)
Potassium: 3.8 mEq/L (ref 3.5–5.1)
Sodium: 141 mEq/L (ref 136–145)
TOTAL PROTEIN: 7.7 g/dL (ref 6.4–8.3)

## 2016-01-19 LAB — MAGNESIUM: MAGNESIUM: 2.2 mg/dL (ref 1.5–2.5)

## 2016-01-19 LAB — LACTATE DEHYDROGENASE: LDH: 145 U/L (ref 125–245)

## 2016-01-19 LAB — PHOSPHORUS: Phosphorus: 3.1 mg/dL (ref 2.5–4.6)

## 2016-01-19 MED ORDER — SODIUM CHLORIDE 0.9 % IV SOLN
240.0000 mg | Freq: Once | INTRAVENOUS | Status: AC
  Administered 2016-01-19: 240 mg via INTRAVENOUS
  Filled 2016-01-19: qty 24

## 2016-01-19 MED ORDER — SODIUM CHLORIDE 0.9 % IV SOLN
Freq: Once | INTRAVENOUS | Status: AC
  Administered 2016-01-19: 13:00:00 via INTRAVENOUS

## 2016-01-19 NOTE — Patient Instructions (Signed)
[facility] Discharge Instructions for Patients Receiving Chemotherapy  Today you received the following chemotherapy agents Nivolumab  To help prevent nausea and vomiting after your treatment, we encourage you to take your nausea medication     If you develop nausea and vomiting that is not controlled by your nausea medication, call the clinic.   BELOW ARE SYMPTOMS THAT SHOULD BE REPORTED IMMEDIATELY:  *FEVER GREATER THAN 100.5 F  *CHILLS WITH OR WITHOUT FEVER  NAUSEA AND VOMITING THAT IS NOT CONTROLLED WITH YOUR NAUSEA MEDICATION  *UNUSUAL SHORTNESS OF BREATH  *UNUSUAL BRUISING OR BLEEDING  TENDERNESS IN MOUTH AND THROAT WITH OR WITHOUT PRESENCE OF ULCERS  *URINARY PROBLEMS  *BOWEL PROBLEMS  UNUSUAL RASH Items with * indicate a potential emergency and should be followed up as soon as possible.  Feel free to call the clinic you have any questions or concerns. The clinic phone number is [phone].  Please show the CHEMO ALERT CARD at check-in to the Emergency Department and triage nurse.   

## 2016-01-19 NOTE — Progress Notes (Signed)
Oncology Nurse Navigator Documentation  Oncology Nurse Navigator Flowsheets 01/19/2016  Navigator Encounter Type Treatment/spoke with patient and wife today.  Omar Peters looks great today and looks that he has gained a little weight.  No barriers identified at this time  Patient Visit Type MedOnc  Treatment Phase Treatment  Barriers/Navigation Needs No barriers at this time  Acuity Level 1  Time Spent with Patient 15

## 2016-01-19 NOTE — Progress Notes (Signed)
BMS370: Group A, Arm B3; Cycle 17/week 32 Patient here today, with spouse, for labs, MD visit and Cycle 17 of study drug Nivolumab. I met with patient and spouse after patient's resting v/s assessment completed. Patient with a 4 pound weight loss since last visit, but patient reports his appetite has not changed and he continues with nutritional supplementation. Patient confirms no new symptoms. Patient denies any nausea, vomiting. He denies any pain, skin problems or diarrhea and no new respiratory concerns. Patient reports that his energy level "a little better." Patient does report to have followed up with his PCP, Dr. Little, regarding his elevated B/P, and patient now taking Amlodipine 10 mg daily, started 01/17/2016. Per Dr. Mohamed's assessment of patient and review of patient's labs, as well as NCS labs, patient cleared for Cycle 17 treatment today. Patient and spouse are aware that patient is scheduled for CT scan on the 8th of February. I also informed them that I will be out the week of the 13th and research nurse Gina Dixon will see them for his return appointment on the 15th of Feb. Patient and spouse gave verbal understanding. During today's visit, patient was informed of re-consent for study due to updates. I reviewed with patient and spouse Version-2, Group A informed consent form, page by page, highlighting the specific changes in the version-2 ICF. At completion of review, all patient's and spouse's questions answered to their satisfaction, patient proceeded to initial each page and signed the last two pages of consent form. Patient's spouse filled in the date for patient on each required page, due to patient having difficulty seeing the date line without his glasses and his difficulty with writing steady. On the last two pages of ICF, spouse had inadvertently picked up a blue ink pen to date the ICF, this nurse can verify that signing and dating of consent occurred on the same day. Patient was  provided with a copy of his signed and dated consent form for his records. I thanked both patient and spouse for their continued support of study and encouraged them to call Dr. Mohamed or myself with any questions or concerns they may have. Study generated vial assignment given to Pharmacist Ginna Tucker, infusion treatment sign instructions given to Natalie Stroud, RN and Loren O'Flahtery RN with all questions answered. , , RN, BSN Clinical Research 01/19/2016 2:59 PM  

## 2016-01-19 NOTE — Progress Notes (Signed)
[facility] Telephone:([phone]   Fax:(336) [place], MD [address] [place] [address]  DIAGNOSIS: Stage IV (T3, N3, M1b) non-small cell lung cancer, adenocarcinoma diagnosed in January 2016 presented with right upper lobe lung mass in addition to mediastinal lymphadenopathy and large right pleural effusion as well as bone metastasis.  PRIOR THERAPY:  1. Status post Pleurx catheter placement by Dr. Servando Snare for drainage of the right pleural effusion.  2. Systemic chemotherapy with carboplatin for AUC of 5 and Alimta 500 MG/M2 every 3 weeks. Status post 6 cycles.  CURRENT THERAPY: Patient is participating in the [place] 370 clinical trial and has been randomized to group A arm B-3 to receive Nivolumab 240 mg given every 2 weeks. First cycle expected 06/10/2015. Status post 16 cycles.  INTERVAL HISTORY: Omar Peters 81 y.o. male returns to the clinic today for follow-up visit accompanied by his wife. The patient is feeling fine today with no specific complaints with no complaints today. He lost around 4 pounds since his last visit but eats well and has good appetite. He is tolerating his treatment with Nivolumab fairly well with no significant adverse effects. He denied having any significant fever or chills. He has no nausea, vomiting, diarrhea or constipation. The patient denied having any significant skin rash. He has no chest pain, shortness of breath, cough or hemoptysis. He is here today to start cycle #17 of his immunotherapy.  MEDICAL HISTORY: Past Medical History  Diagnosis Date  . Hypertension   . GERD (gastroesophageal reflux disease)   . Shortness of breath dyspnea     01/14/15 at night and has to sit up  . Anxiety     due to diagnosis of Stage 4 Lung Cancer  . Macular degeneration of right eye   . Cancer ([facility])     Lung  . Diabetes mellitus without complication (HCC)     ALLERGIES:  is allergic to  amitriptyline and marinol.  MEDICATIONS:  Current Outpatient Prescriptions  Medication Sig Dispense Refill  . amLODipine (NORVASC) 10 MG tablet Take 10 mg by mouth daily.    Marland Kitchen aspirin 81 MG tablet Take 81 mg by mouth daily.    . B Complex-Biotin-FA (B-COMPLEX PO) Take 1 each by mouth daily.    Marland Kitchen HYDROcodone-acetaminophen (NORCO) 5-325 MG per tablet Take 1 tablet by mouth every 6 (six) hours as needed for moderate pain. 30 tablet 0  . metFORMIN (GLUCOPHAGE) 500 MG tablet Take 500 mg by mouth daily.    . Naproxen Sodium (ALEVE PO) Take 1 tablet by mouth daily as needed.    Marland Kitchen OVER THE COUNTER MEDICATION Apply 1 patch topically daily as needed. Reported on 01/05/2016    . prochlorperazine (COMPAZINE) 10 MG tablet TAKE ONE TABLET BY MOUTH EVERY 6 HOURS AS NEEDED FOR NAUSEA AND VOMITING 30 tablet 0  . ranitidine (ZANTAC) 150 MG tablet Take 150 mg by mouth daily.    Marland Kitchen senna-docusate (SENNA S) 8.6-50 MG per tablet Take 2 tablets by mouth daily.     No current facility-administered medications for this visit.    SURGICAL HISTORY:  Past Surgical History  Procedure Laterality Date  . Vein ligation and stripping  1977  . Chest tube insertion Right 01/15/2015    Procedure: INSERTION PLEURAL DRAINAGE CATHETER WITH ULTRASOUND AND FLURO GUIDANCE;  Surgeon: Grace Isaac, MD;  Location: [facility];  Service: Thoracic;  Laterality: Right;  .  Removal of pleural drainage catheter Right 03/26/2015    Procedure: REMOVAL OF PLEURAL DRAINAGE CATHETER;  Surgeon: Grace Isaac, MD;  Location: [facility];  Service: Thoracic;  Laterality: Right;    REVIEW OF SYSTEMS:  A comprehensive review of systems was negative except for: Constitutional: positive for fatigue and weight loss   PHYSICAL EXAMINATION: General appearance: alert, cooperative and no distress Head: Normocephalic, without obvious abnormality, atraumatic Neck: no adenopathy, no JVD, supple, symmetrical, trachea midline and thyroid not enlarged, symmetric,  no tenderness/mass/nodules Lymph nodes: Cervical, supraclavicular, and axillary nodes normal. Resp: clear to auscultation bilaterally Back: symmetric, no curvature. ROM normal. No CVA tenderness. Cardio: regular rate and rhythm, S1, S2 normal, no murmur, click, rub or gallop GI: soft, non-tender; bowel sounds normal; no masses,  no organomegaly Extremities: extremities normal, atraumatic, no cyanosis or edema Neurologic: Alert and oriented X 3, normal strength and tone. Normal symmetric reflexes. Normal coordination and gait  ECOG PERFORMANCE STATUS: 1 - Symptomatic but completely ambulatory  Blood pressure 147/70, pulse 103, temperature 97.8 F (36.6 C), temperature source Oral, resp. rate 18, weight 145 lb 9.6 oz (66.044 kg), SpO2 97 %.  LABORATORY DATA: Lab Results  Component Value Date   WBC 9.2 01/19/2016   HGB 16.2 01/19/2016   HCT 48.6 01/19/2016   MCV 99.0* 01/19/2016   PLT 232 01/19/2016      Chemistry      Component Value Date/Time   NA 141 01/19/2016 1101   NA 140 12/02/2015 0400   K 3.8 01/19/2016 1101   K 3.4* 12/02/2015 0400   CL 105 12/02/2015 0400   CO2 29 01/19/2016 1101   CO2 28 12/02/2015 0400   BUN 10.1 01/19/2016 1101   BUN 8 12/02/2015 0400   CREATININE 0.9 01/19/2016 1101   CREATININE 0.76 12/02/2015 0400   GLU 231* 03/10/2015 1539      Component Value Date/Time   CALCIUM 9.8 01/19/2016 1101   CALCIUM 8.8* 12/02/2015 0400   ALKPHOS 103 01/19/2016 1101   ALKPHOS 71 12/02/2015 0400   AST 18 01/19/2016 1101   AST 18 12/02/2015 0400   ALT 16 01/19/2016 1101   ALT 16* 12/02/2015 0400   BILITOT 0.50 01/19/2016 1101   BILITOT 0.7 12/02/2015 0400       RADIOGRAPHIC STUDIES: No results found.  ASSESSMENT AND PLAN: This is a very pleasant 81 years old white male with history of stage IV non-small cell lung cancer, adenocarcinoma status post induction chemotherapy was carboplatin and Alimta and currently undergoing treatment with immunotherapy  according to the BMS checkmated 370 clinical trial. He was randomized to the Nivolumab arm 240 mg IV every 2 weeks. He is status post 15 cycles and tolerating his treatment fairly well. The patient will proceed with cycle #17 today as scheduled. He would come back for follow-up visit in 2 weeks for evaluation before starting cycle #18 after repeating CT scan of the chest, abdomen and pelvis next week. The patient was advised to call immediately if he has any concerning symptoms in the interval. The patient voices understanding of current disease status and treatment options and is in agreement with the current care plan.  All questions were answered. The patient knows to call the clinic with any problems, questions or concerns. We can certainly see the patient much sooner if necessary.  Disclaimer: This note was dictated with voice recognition software. Similar sounding words can inadvertently be transcribed and may not be corrected upon review.

## 2016-01-26 ENCOUNTER — Encounter (HOSPITAL_COMMUNITY): Payer: Self-pay

## 2016-01-26 ENCOUNTER — Ambulatory Visit (HOSPITAL_COMMUNITY)
Admission: RE | Admit: 2016-01-26 | Discharge: 2016-01-26 | Disposition: A | Discharge status: Home / Self Care | Payer: Medicare Other | Source: Ambulatory Visit | Attending: Internal Medicine | Admitting: Internal Medicine

## 2016-01-26 DIAGNOSIS — I2510 Atherosclerotic heart disease of native coronary artery without angina pectoris: Secondary | ICD-10-CM | POA: Insufficient documentation

## 2016-01-26 DIAGNOSIS — C3411 Malignant neoplasm of upper lobe, right bronchus or lung: Secondary | ICD-10-CM | POA: Insufficient documentation

## 2016-01-26 DIAGNOSIS — Z9221 Personal history of antineoplastic chemotherapy: Secondary | ICD-10-CM | POA: Diagnosis not present

## 2016-01-26 DIAGNOSIS — K838 Other specified diseases of biliary tract: Secondary | ICD-10-CM | POA: Insufficient documentation

## 2016-01-26 DIAGNOSIS — C7951 Secondary malignant neoplasm of bone: Secondary | ICD-10-CM

## 2016-01-26 DIAGNOSIS — Z5112 Encounter for antineoplastic immunotherapy: Secondary | ICD-10-CM

## 2016-01-26 MED ORDER — IOHEXOL 300 MG/ML  SOLN
100.0000 mL | Freq: Once | INTRAMUSCULAR | Status: AC | PRN
  Administered 2016-01-26: 100 mL via INTRAVENOUS

## 2016-01-27 ENCOUNTER — Encounter: Payer: Self-pay | Admitting: Medical Oncology

## 2016-01-27 ENCOUNTER — Other Ambulatory Visit: Payer: Self-pay | Admitting: Medical Oncology

## 2016-01-27 DIAGNOSIS — C3411 Malignant neoplasm of upper lobe, right bronchus or lung: Secondary | ICD-10-CM

## 2016-01-27 NOTE — Progress Notes (Signed)
BMS 370: Group A, Arm B3 Patient's 02/08 restaging CT scan reviewed with Dr. Julien Nordmann. Per MD, with CT results, plan is continue patient on study and treatment, pending patient's agreement. Patient has lab, MD and treatment appointments on 02/15. Epip sent to study and reviewed with Dr. Duwayne Heck. Per Dr. Duwayne Heck, she agrees with Dr. Worthy Flank decision to continue with treatment.  Adele Dan, RN, BSN Clinical Research 01/27/2016 12:14 PM

## 2016-02-02 ENCOUNTER — Other Ambulatory Visit (HOSPITAL_BASED_OUTPATIENT_CLINIC_OR_DEPARTMENT_OTHER): Payer: Medicare Other

## 2016-02-02 ENCOUNTER — Telehealth: Payer: Self-pay | Admitting: *Deleted

## 2016-02-02 ENCOUNTER — Ambulatory Visit: Payer: Medicare Other

## 2016-02-02 ENCOUNTER — Encounter: Payer: Self-pay | Admitting: *Deleted

## 2016-02-02 ENCOUNTER — Other Ambulatory Visit (HOSPITAL_COMMUNITY)
Admission: RE | Admit: 2016-02-02 | Discharge: 2016-02-02 | Disposition: A | Discharge status: Home / Self Care | Payer: Medicare Other | Source: Ambulatory Visit | Attending: Internal Medicine | Admitting: Internal Medicine

## 2016-02-02 ENCOUNTER — Encounter: Payer: Self-pay | Admitting: Internal Medicine

## 2016-02-02 ENCOUNTER — Telehealth: Payer: Self-pay | Admitting: Internal Medicine

## 2016-02-02 ENCOUNTER — Ambulatory Visit (HOSPITAL_BASED_OUTPATIENT_CLINIC_OR_DEPARTMENT_OTHER): Payer: Medicare Other | Admitting: Internal Medicine

## 2016-02-02 VITALS — BP 145/65 | HR 92 | Temp 97.5°F | Resp 18 | Ht 66.0 in | Wt 143.5 lb

## 2016-02-02 DIAGNOSIS — C3431 Malignant neoplasm of lower lobe, right bronchus or lung: Secondary | ICD-10-CM | POA: Insufficient documentation

## 2016-02-02 DIAGNOSIS — Z5112 Encounter for antineoplastic immunotherapy: Secondary | ICD-10-CM

## 2016-02-02 DIAGNOSIS — Z006 Encounter for examination for normal comparison and control in clinical research program: Secondary | ICD-10-CM

## 2016-02-02 DIAGNOSIS — C3411 Malignant neoplasm of upper lobe, right bronchus or lung: Secondary | ICD-10-CM

## 2016-02-02 DIAGNOSIS — C7951 Secondary malignant neoplasm of bone: Secondary | ICD-10-CM

## 2016-02-02 DIAGNOSIS — M25511 Pain in right shoulder: Secondary | ICD-10-CM

## 2016-02-02 LAB — COMPREHENSIVE METABOLIC PANEL
ALBUMIN: 3.3 g/dL — AB (ref 3.5–5.0)
ALK PHOS: 101 U/L (ref 40–150)
ALT: 17 U/L (ref 0–55)
AST: 20 U/L (ref 5–34)
Anion Gap: 9 mEq/L (ref 3–11)
BILIRUBIN TOTAL: 0.38 mg/dL (ref 0.20–1.20)
BUN: 11.2 mg/dL (ref 7.0–26.0)
CO2: 29 mEq/L (ref 22–29)
CREATININE: 0.9 mg/dL (ref 0.7–1.3)
Calcium: 9.8 mg/dL (ref 8.4–10.4)
Chloride: 103 mEq/L (ref 98–109)
EGFR: 82 mL/min/{1.73_m2} — ABNORMAL LOW (ref >90)
GLUCOSE: 151 mg/dL — AB (ref 70–140)
Potassium: 3.6 mEq/L (ref 3.5–5.1)
SODIUM: 140 meq/L (ref 136–145)
TOTAL PROTEIN: 7.5 g/dL (ref 6.4–8.3)

## 2016-02-02 LAB — CBC WITH DIFFERENTIAL/PLATELET
BASO%: 1.5 % (ref 0.0–2.0)
Basophils Absolute: 0.1 10*3/uL (ref 0.0–0.1)
EOS%: 3.8 % (ref 0.0–7.0)
Eosinophils Absolute: 0.3 10*3/uL (ref 0.0–0.5)
HCT: 45.6 % (ref 38.4–49.9)
HEMOGLOBIN: 15.8 g/dL (ref 13.0–17.1)
LYMPH#: 2.6 10*3/uL (ref 0.9–3.3)
LYMPH%: 29.9 % (ref 14.0–49.0)
MCH: 33.8 pg — ABNORMAL HIGH (ref 27.2–33.4)
MCHC: 34.6 g/dL (ref 32.0–36.0)
MCV: 97.6 fL (ref 79.3–98.0)
MONO#: 0.7 10*3/uL (ref 0.1–0.9)
MONO%: 8.2 % (ref 0.0–14.0)
NEUT#: 5 10*3/uL (ref 1.5–6.5)
NEUT%: 56.6 % (ref 39.0–75.0)
NRBC: 0 % (ref 0–0)
Platelets: 184 10*3/uL (ref 140–400)
RBC: 4.67 10*6/uL (ref 4.20–5.82)
RDW: 13.6 % (ref 11.0–14.6)
WBC: 8.8 10*3/uL (ref 4.0–10.3)

## 2016-02-02 LAB — MAGNESIUM: Magnesium: 2.1 mg/dl (ref 1.5–2.5)

## 2016-02-02 LAB — PHOSPHORUS: Phosphorus: 3.1 mg/dL (ref 2.5–4.6)

## 2016-02-02 LAB — LACTATE DEHYDROGENASE: LDH: 157 U/L (ref 125–245)

## 2016-02-02 NOTE — Progress Notes (Addendum)
[facility] [phone]   Fax:([phone]) [place], MD [address] [place] [address]  DIAGNOSIS: Stage IV (T3, N3, M1b) non-small cell lung cancer, adenocarcinoma diagnosed in January 2016 presented with right upper lobe lung mass in addition to mediastinal lymphadenopathy and large right pleural effusion as well as bone metastasis.  PRIOR THERAPY:  1. Status post Pleurx catheter placement by Dr. Servando Snare for drainage of the right pleural effusion.  2. Systemic chemotherapy with carboplatin for AUC of 5 and Alimta 500 MG/M2 every 3 weeks. Status post 6 cycles.  CURRENT THERAPY: Patient is participating in the [facility] 370 clinical trial and has been randomized to group A arm B-3 to receive Nivolumab 240 mg given every 2 weeks. First cycle expected 06/10/2015. Status post 17 cycles.  INTERVAL HISTORY: Omar Peters 81 y.o. male returns to the clinic today for follow-up visit accompanied by his wife. The patient is feeling fine today with no specific complaints with no complaints today except for the persistent pain in the right shoulder area which increased after his fishing trip few weeks ago. He is tolerating his treatment with Nivolumab fairly well with no significant adverse effects. He denied having any significant fever or chills. He has no nausea, vomiting, diarrhea or constipation. The patient denied having any significant skin rash. He has no chest pain, shortness of breath, cough or hemoptysis. He had repeat CT scan of the chest, abdomen and pelvis performed recently and he is here today for evaluation and discussion of his scan results and treatment options.   MEDICAL HISTORY: Past Medical History  Diagnosis Date  . Hypertension   . GERD (gastroesophageal reflux disease)   . Shortness of breath dyspnea     01/14/15 at night and has to sit up  . Anxiety     due to diagnosis of Stage 4 Lung Cancer  .  Macular degeneration of right eye   . Cancer ([facility])     Lung  . Diabetes mellitus without complication (HCC)     ALLERGIES:  is allergic to amitriptyline and marinol.  MEDICATIONS:  Current Outpatient Prescriptions  Medication Sig Dispense Refill  . amLODipine (NORVASC) 10 MG tablet Take 10 mg by mouth daily.    Marland Kitchen aspirin 81 MG tablet Take 81 mg by mouth daily.    . B Complex-Biotin-FA (B-COMPLEX PO) Take 1 each by mouth daily.    Marland Kitchen HYDROcodone-acetaminophen (NORCO) 5-325 MG per tablet Take 1 tablet by mouth every 6 (six) hours as needed for moderate pain. 30 tablet 0  . metFORMIN (GLUCOPHAGE) 500 MG tablet Take 500 mg by mouth daily.    . Naproxen Sodium (ALEVE PO) Take 1 tablet by mouth daily as needed.    Marland Kitchen OVER THE COUNTER MEDICATION Apply 1 patch topically daily as needed. Reported on 01/05/2016    . prochlorperazine (COMPAZINE) 10 MG tablet TAKE ONE TABLET BY MOUTH EVERY 6 HOURS AS NEEDED FOR NAUSEA AND VOMITING 30 tablet 0  . ranitidine (ZANTAC) 150 MG tablet Take 150 mg by mouth daily.    Marland Kitchen senna-docusate (SENNA S) 8.6-50 MG per tablet Take 2 tablets by mouth daily.     No current facility-administered medications for this visit.    SURGICAL HISTORY:  Past Surgical History  Procedure Laterality Date  . Vein ligation and stripping  1977  . Chest tube insertion Right 01/15/2015    Procedure: INSERTION PLEURAL DRAINAGE CATHETER WITH  ULTRASOUND AND FLURO GUIDANCE;  Surgeon: Grace Isaac, MD;  Location: [facility];  Service: Thoracic;  Laterality: Right;  . Removal of pleural drainage catheter Right 03/26/2015    Procedure: REMOVAL OF PLEURAL DRAINAGE CATHETER;  Surgeon: Grace Isaac, MD;  Location: [facility];  Service: Thoracic;  Laterality: Right;    REVIEW OF SYSTEMS:  Constitutional: positive for fatigue Eyes: negative Ears, nose, mouth, throat, and face: negative Respiratory: negative Cardiovascular: negative Gastrointestinal:  negative Genitourinary:negative Integument/breast: negative Hematologic/lymphatic: negative Musculoskeletal:positive for bone pain Neurological: negative Behavioral/Psych: negative Endocrine: negative Allergic/Immunologic: negative   PHYSICAL EXAMINATION: General appearance: alert, cooperative and no distress Head: Normocephalic, without obvious abnormality, atraumatic Neck: no adenopathy, no JVD, supple, symmetrical, trachea midline and thyroid not enlarged, symmetric, no tenderness/mass/nodules Lymph nodes: Cervical, supraclavicular, and axillary nodes normal. Resp: clear to auscultation bilaterally Back: symmetric, no curvature. ROM normal. No CVA tenderness. Cardio: regular rate and rhythm, S1, S2 normal, no murmur, click, rub or gallop GI: soft, non-tender; bowel sounds normal; no masses,  no organomegaly Extremities: extremities normal, atraumatic, no cyanosis or edema Neurologic: Alert and oriented X 3, normal strength and tone. Normal symmetric reflexes. Normal coordination and gait  ECOG PERFORMANCE STATUS: 1 - Symptomatic but completely ambulatory  Blood pressure 145/65, pulse 92, temperature 97.5 F (36.4 C), resp. rate 18, height '5\' 6"'$  (1.676 m), weight 143 lb 8 oz (65.091 kg), SpO2 97 %.  LABORATORY DATA: Lab Results  Component Value Date   WBC 8.8 02/02/2016   HGB 15.8 02/02/2016   HCT 45.6 02/02/2016   MCV 97.6 02/02/2016   PLT 184 02/02/2016      Chemistry      Component Value Date/Time   NA 141 01/19/2016 1101   NA 140 12/02/2015 0400   K 3.8 01/19/2016 1101   K 3.4* 12/02/2015 0400   CL 105 12/02/2015 0400   CO2 29 01/19/2016 1101   CO2 28 12/02/2015 0400   BUN 10.1 01/19/2016 1101   BUN 8 12/02/2015 0400   CREATININE 0.9 01/19/2016 1101   CREATININE 0.76 12/02/2015 0400   GLU 231* 03/10/2015 1539      Component Value Date/Time   CALCIUM 9.8 01/19/2016 1101   CALCIUM 8.8* 12/02/2015 0400   ALKPHOS 103 01/19/2016 1101   ALKPHOS 71 12/02/2015  0400   AST 18 01/19/2016 1101   AST 18 12/02/2015 0400   ALT 16 01/19/2016 1101   ALT 16* 12/02/2015 0400   BILITOT 0.50 01/19/2016 1101   BILITOT 0.7 12/02/2015 0400       RADIOGRAPHIC STUDIES: Ct Chest W Contrast  01/26/2016  CLINICAL DATA:  81 year old male with history of lung cancer with bone metastasis diagnosed in January 2016. Chemotherapy ongoing. RECIST protocol. EXAM: CT CHEST, ABDOMEN, AND PELVIS WITH CONTRAST TECHNIQUE: Multidetector CT imaging of the chest, abdomen and pelvis was performed following the standard protocol during bolus administration of intravenous contrast. CONTRAST:  135m OMNIPAQUE IOHEXOL 300 MG/ML  SOLN COMPARISON:  CT of the chest, abdomen and pelvis 12/01/2015. FINDINGS: RECIST 1.1 Target Lesions: 1. Previously noted right upper lobe mass measures approximately 6.5 x 4.2 cm (image 19 of series 2). This measurement is larger than prior exam 12/01/2015, but it is largely reflective of differences in measurement technique. The mass is very indistinct and difficult to separate from the adjacent collapsed/consolidated lung, and overall is favored to be relatively stable in size compared to the prior examination. Non-target Lesions: 1. AP window lymph node measuring 9 mm (image 23 of series  2), unchanged. 2. Subcarinal lymph node measuring 9 mm (image 27 of series 2), unchanged. 3. Multiple lytic osseous lesions again noted, largest of which is in the medial aspect of the right ilium measuring up to 4.3 cm (image 91 of series 2), increased from 3.9 cm on prior exam 12/01/2015. 4. Chronic small right-sided enhancing pleural effusion, unchanged. 5. Low right paratracheal lymph node measuring 8 mm in short axis (image 23 of series 2), unchanged. 6. Tiny sub cm nodules in the left lower lobe are unchanged, and highly likely to be benign areas of mucoid impaction (image 37 of series 4). CT CHEST FINDINGS Mediastinum/Lymph Nodes: Multiple nonenlarged lymph nodes are again noted  throughout the mediastinal and hilar nodal stations, unchanged compared to prior examinations, largest of which include a 9 mm subcarinal and AP window lymph nodes. Heart size is normal. There is no significant pericardial fluid, thickening or pericardial calcification. There is atherosclerosis of the thoracic aorta, the great vessels of the mediastinum and the coronary arteries, including calcified atherosclerotic plaque in the left main, left anterior descending and right coronary arteries. Dependent secretions in the distal trachea and proximal right mainstem bronchus. Extensive soft tissue thickening in the right hilar region, similar to prior examinations. Lungs/Pleura: Previously noted right upper lobe mass measures approximately 6.5 x 4.2 cm (image 19 of series 2). This measurement is larger than prior exam 12/01/2015, but it is largely reflective of differences in measurement technique. The mass is very indistinct and difficult to separate from the adjacent collapsed/consolidated lung, and overall is favored to be relatively stable in size compared to the prior examination. There continues to be extensive architectural distortion surrounding this mass, presumably evolving postradiation changes. There is increasing septal thickening and thickening of the peribronchovascular interstitium diffusely throughout the right lung, which is concerning for lymphangitic spread of tumor. In addition, there is a pleural-based mass-like opacity in the posterior aspect of the right lower lobe which is unchanged over several recent prior examinations, measuring approximately 5.8 x 1.9 cm (image 36 of series 4), favored to represent an area of rounded atelectasis. Mild diffuse bronchial wall thickening with moderate centrilobular and mild paraseptal emphysema. Small chronic partially loculated thick-walled enhancing pleural effusion is unchanged, presumably malignant. Musculoskeletal/Soft Tissues: Multiple ill-defined lytic  lesions are again noted, the largest of which is in the right glenoid where there is an expansile lesion that measures approximately 3.8 x 3.0 cm (image 8 of series 4), which appears slightly larger than the prior examination. CT ABDOMEN AND PELVIS FINDINGS Hepatobiliary: The liver has a shrunken appearance and nodular contour, however with underlying cirrhosis. No definite cystic or solid hepatic lesions. No intrahepatic biliary ductal dilatation. Common bile duct is dilated measuring 14 mm in the porta hepatis, only slightly increased compared to the prior study (13 mm on the prior). Gallbladder wall appears mildly thickened and demonstrates avid enhancement (5 mm). No gallstones. No surrounding inflammatory changes. Pancreas: No pancreatic mass. No pancreatic ductal dilatation. No pancreatic or peripancreatic fluid or inflammatory changes. Spleen: Unremarkable. Adrenals/Urinary Tract: Bilateral adrenal glands are normal in appearance. Sub cm low-attenuation lesions in the right kidney are too small to definitively characterize, but unchanged compared to prior examinations, likely cysts. Left kidney is normal in appearance. No hydroureteronephrosis. Urinary bladder is normal in appearance. Stomach/Bowel: The stomach is normal in appearance. No pathologic dilatation of small bowel or colon. Normal appendix. Vascular/Lymphatic: Extensive atherosclerosis throughout the abdominal and pelvic vasculature, without evidence of aneurysm or dissection. No lymphadenopathy noted  in the abdomen or pelvis. Reproductive: Prostate gland and seminal vesicles are unremarkable in appearance. Other: No significant volume of ascites.  No pneumoperitoneum. Musculoskeletal: Numerous lytic lesions are again noted throughout the visualized skeleton, the largest of which are in the right hemipelvis in the superior aspect of the right ilium. The largest of these measures 4.3 x 2.4 cm (image 91 of series 2), slightly larger than the prior  examination. The adjacent smaller lesion is also larger than the prior examination measuring 19 x 22 mm on today's study (previously 17 x 11 mm on 12/01/2015). IMPRESSION: 1. Slight progression of metastatic disease to the bones, as detailed above. 2. Otherwise, the examination appears stable compared to the prior study, including the primary target lesion in the right upper lobe (despite differences in measurement technique), as discussed above. 3. Continued dilatation of the common bile duct, very similar to prior studies, of uncertain etiology and significance. No associated intrahepatic biliary ductal dilatation to suggest frank obstruction. There continues to be a relatively contracted and slightly thick-walled enhancing gallbladder, which is also unchanged. No definite gallbladder wall mass identified at this time. 4. Stigmata of cirrhosis redemonstrated. 5. Extensive atherosclerosis, including left main and 2 vessel coronary artery disease. 6. Additional incidental findings, as above. Electronically Signed   By: Vinnie Langton M.D.   On: 01/26/2016 10:15   Ct Abdomen Pelvis W Contrast  01/26/2016  CLINICAL DATA:  81 year old male with history of lung cancer with bone metastasis diagnosed in January 2016. Chemotherapy ongoing. RECIST protocol. EXAM: CT CHEST, ABDOMEN, AND PELVIS WITH CONTRAST TECHNIQUE: Multidetector CT imaging of the chest, abdomen and pelvis was performed following the standard protocol during bolus administration of intravenous contrast. CONTRAST:  182m OMNIPAQUE IOHEXOL 300 MG/ML  SOLN COMPARISON:  CT of the chest, abdomen and pelvis 12/01/2015. FINDINGS: RECIST 1.1 Target Lesions: 1. Previously noted right upper lobe mass measures approximately 6.5 x 4.2 cm (image 19 of series 2). This measurement is larger than prior exam 12/01/2015, but it is largely reflective of differences in measurement technique. The mass is very indistinct and difficult to separate from the adjacent  collapsed/consolidated lung, and overall is favored to be relatively stable in size compared to the prior examination. Non-target Lesions: 1. AP window lymph node measuring 9 mm (image 23 of series 2), unchanged. 2. Subcarinal lymph node measuring 9 mm (image 27 of series 2), unchanged. 3. Multiple lytic osseous lesions again noted, largest of which is in the medial aspect of the right ilium measuring up to 4.3 cm (image 91 of series 2), increased from 3.9 cm on prior exam 12/01/2015. 4. Chronic small right-sided enhancing pleural effusion, unchanged. 5. Low right paratracheal lymph node measuring 8 mm in short axis (image 23 of series 2), unchanged. 6. Tiny sub cm nodules in the left lower lobe are unchanged, and highly likely to be benign areas of mucoid impaction (image 37 of series 4). CT CHEST FINDINGS Mediastinum/Lymph Nodes: Multiple nonenlarged lymph nodes are again noted throughout the mediastinal and hilar nodal stations, unchanged compared to prior examinations, largest of which include a 9 mm subcarinal and AP window lymph nodes. Heart size is normal. There is no significant pericardial fluid, thickening or pericardial calcification. There is atherosclerosis of the thoracic aorta, the great vessels of the mediastinum and the coronary arteries, including calcified atherosclerotic plaque in the left main, left anterior descending and right coronary arteries. Dependent secretions in the distal trachea and proximal right mainstem bronchus. Extensive soft tissue thickening in  the right hilar region, similar to prior examinations. Lungs/Pleura: Previously noted right upper lobe mass measures approximately 6.5 x 4.2 cm (image 19 of series 2). This measurement is larger than prior exam 12/01/2015, but it is largely reflective of differences in measurement technique. The mass is very indistinct and difficult to separate from the adjacent collapsed/consolidated lung, and overall is favored to be relatively stable  in size compared to the prior examination. There continues to be extensive architectural distortion surrounding this mass, presumably evolving postradiation changes. There is increasing septal thickening and thickening of the peribronchovascular interstitium diffusely throughout the right lung, which is concerning for lymphangitic spread of tumor. In addition, there is a pleural-based mass-like opacity in the posterior aspect of the right lower lobe which is unchanged over several recent prior examinations, measuring approximately 5.8 x 1.9 cm (image 36 of series 4), favored to represent an area of rounded atelectasis. Mild diffuse bronchial wall thickening with moderate centrilobular and mild paraseptal emphysema. Small chronic partially loculated thick-walled enhancing pleural effusion is unchanged, presumably malignant. Musculoskeletal/Soft Tissues: Multiple ill-defined lytic lesions are again noted, the largest of which is in the right glenoid where there is an expansile lesion that measures approximately 3.8 x 3.0 cm (image 8 of series 4), which appears slightly larger than the prior examination. CT ABDOMEN AND PELVIS FINDINGS Hepatobiliary: The liver has a shrunken appearance and nodular contour, however with underlying cirrhosis. No definite cystic or solid hepatic lesions. No intrahepatic biliary ductal dilatation. Common bile duct is dilated measuring 14 mm in the porta hepatis, only slightly increased compared to the prior study (13 mm on the prior). Gallbladder wall appears mildly thickened and demonstrates avid enhancement (5 mm). No gallstones. No surrounding inflammatory changes. Pancreas: No pancreatic mass. No pancreatic ductal dilatation. No pancreatic or peripancreatic fluid or inflammatory changes. Spleen: Unremarkable. Adrenals/Urinary Tract: Bilateral adrenal glands are normal in appearance. Sub cm low-attenuation lesions in the right kidney are too small to definitively characterize, but  unchanged compared to prior examinations, likely cysts. Left kidney is normal in appearance. No hydroureteronephrosis. Urinary bladder is normal in appearance. Stomach/Bowel: The stomach is normal in appearance. No pathologic dilatation of small bowel or colon. Normal appendix. Vascular/Lymphatic: Extensive atherosclerosis throughout the abdominal and pelvic vasculature, without evidence of aneurysm or dissection. No lymphadenopathy noted in the abdomen or pelvis. Reproductive: Prostate gland and seminal vesicles are unremarkable in appearance. Other: No significant volume of ascites.  No pneumoperitoneum. Musculoskeletal: Numerous lytic lesions are again noted throughout the visualized skeleton, the largest of which are in the right hemipelvis in the superior aspect of the right ilium. The largest of these measures 4.3 x 2.4 cm (image 91 of series 2), slightly larger than the prior examination. The adjacent smaller lesion is also larger than the prior examination measuring 19 x 22 mm on today's study (previously 17 x 11 mm on 12/01/2015). IMPRESSION: 1. Slight progression of metastatic disease to the bones, as detailed above. 2. Otherwise, the examination appears stable compared to the prior study, including the primary target lesion in the right upper lobe (despite differences in measurement technique), as discussed above. 3. Continued dilatation of the common bile duct, very similar to prior studies, of uncertain etiology and significance. No associated intrahepatic biliary ductal dilatation to suggest frank obstruction. There continues to be a relatively contracted and slightly thick-walled enhancing gallbladder, which is also unchanged. No definite gallbladder wall mass identified at this time. 4. Stigmata of cirrhosis redemonstrated. 5. Extensive atherosclerosis, including left main  and 2 vessel coronary artery disease. 6. Additional incidental findings, as above. Electronically Signed   By: Vinnie Langton  M.D.   On: 01/26/2016 10:15    ASSESSMENT AND PLAN: This is a very pleasant 81 years old white male with history of stage IV non-small cell lung cancer, adenocarcinoma status post induction chemotherapy was carboplatin and Alimta and currently undergoing treatment with immunotherapy according to the [employer] 370 clinical trial. He was randomized to the Nivolumab arm 240 mg IV every 2 weeks. He is status post 17 cycles and tolerating his treatment fairly well. The recent CT scan of the chest, abdomen and pelvis showed slight disease progression to the bone especially in the right glenoid and right hemipelvis. His other lesions are stable. I discussed the scan results with the patient and his wife. The patient is currently asymptomatic except for the pain in the right shoulder area. I think the patient is still benefiting from his treatment with immunotherapy and I would recommend for him to continue with the same treatment. Regarding the pain in the right shoulder area, I would consider referring the patient to [name] from radiation oncology for consideration of short course of palliative radiotherapy to this area. This may require holding his treatment for the week before and week after his radiation. We will hold his treatment today until we discussed with the study monitor his current scan results and receive final approval to continue with his treatment. The patient his wife agreed to the current plan. The patient was advised to call immediately if he has any concerning symptoms in the interval. The patient voices understanding of current disease status and treatment options and is in agreement with the current care plan.  All questions were answered. The patient knows to call the clinic with any problems, questions or concerns. We can certainly see the patient much sooner if necessary.  Disclaimer: This note was dictated with voice recognition software. Similar sounding words can  inadvertently be transcribed and may not be corrected upon review.  ADDENDUM: After discussion with the study monitor, Dr. Duwayne Heck, who agreed with the patient to continue treatment on the clinical trial. On 02/03/2016, I recommended for the patient to proceed with his immunotherapy with Nivolumab today and the guidelines regarding radiotherapy during the treatment will be followed.

## 2016-02-02 NOTE — Telephone Encounter (Signed)
Per staff message and POF I have scheduled appts. Advised scheduler of appts. JMW  

## 2016-02-02 NOTE — Progress Notes (Signed)
02/02/2016  1145 BMS 370, Group A, Arm B3, Cycle 18/week 34 The patient comes in to the clinic today accompanied by his wife.  I explained to patient that I would be covering for his research nurse who is out of the office.  Labs were drawn and a resting O2 sat with vital signs were taken. The patient denies having any complaints except right shoulder pain.  He denies diarrhea, constipation, nausea/vomiting, SOB, wheezing, or other changes. Dr. Julien Nordmann was in to see patient and perform a targeted exam.  He reviewed the latest CT scan results with the patient and his wife.  Dr. Julien Nordmann stated that it is his recommendation was to continue treatment as the patient shows no signs of clinical progression since last scan.  Dr. Julien Nordmann is aware of the ePIP sent on 01/26/16 to the medical monitor, Dr. Patsey Berthold, by Jerilynn Mages. Leonetti, which included a copy of the RECIST worksheet and latest CT scan report from 01/26/16.  Per the medical monitor, agree to continue treatment.  Dr. Julien Nordmann confirmed.  Based on lab results review, targeted physical exam, and CT results from 01/26/16, Dr. Julien Nordmann cleared the patient for continued treatment with Nivolumab.  Dr. Julien Nordmann did feel like the patient would benefit from palliative radiation to his right shoulder and that it would help with the pain.  Section 3.4.3 of protocol shown to MD addressing the pall. RT.  Per MD, hold the treatment today and contact the medical monitor to clarify protocol requirements and current CT results.  The patient and wife agreed.  This RN explained to the patient and his wife that they would be contacted by this RN on 02/03/16. This RN submitted an ePIP # [id number] to medical monitor and will wait for a response. Marcellus Scott, RN  02/03/16 0745 Received ePIP response from medical monitor who confirmed the patient could continue treatment with Nivolumab.  The response back from medical monitor did not fully answer the questions that were submitted and the medical  monitor listed incorrect CT scan dates in the reply.  Dr. Julien Nordmann notified and was okay with patient coming in today to receive the treatment.  The radiation consult with [name] is on 02/07/16 and it is unknown at this time if or when the patient would receive RT.   However, Dr. Julien Nordmann agreed that this RN should re-contact medical monitor first for acknowledgement of clarification on scan dates and further agreement to continue treatment today with study drug. Marcellus Scott, RN  02/03/2016 1050 Spoke with Dr. Duwayne Heck by phone re: ePIP # [id number] She read the ePIP with me over the phone and said she would reply back back in an ePIP that: 1. She acknolwedged the CT date the right glenoid appeared was on 11/30/16 and that the CT from 01/26/16 did not show unequivacle progression 2. She stated the scan results from 01/26/16 did not change her recommendation that Dr. Donley Redder could continue treatment with Nivolumab today.   She reiterated the protocol for pall. RT should be followed per section 3.4.3. 3. She stated it was okay not to do a targeted exam again today, prior to treatment , as long as nothing has changed with the patient since yesterday and as long as Dr. Julien Nordmann agreed.   This RN spoke with Dr. Julien Nordmann and he agreed.  [name] was also notified by email  that patient is scheduled to see him on 02/07/16.  The specifics of the protocol requirements were sent to him as well as information  on current treatment with Nivolumab, last date to be received (02/03/16) and protocol parameters to follow. Marcellus Scott, RN  02/03/2016 1535 The patient arrived to clinic with his wife.  He reports no changes since his visit with Dr. Julien Nordmann yesterday.  Weight,  resting VS and O2 sat. were obtained.  Dr. Julien Nordmann aware patient is here, VSS, and that patient reports no changes since he was seen on 02/03/16.  Dr. Julien Nordmann said to proceed with treatment. Infusion sign with instructions given to Arna Snipe, RN and IWRS  vial assignment given to [employer], Pharm. D.  The patient and his wife verbalized understanding to call for problems.   Marcellus Scott, RN

## 2016-02-02 NOTE — Telephone Encounter (Signed)
Pt confirmed labs/ov per 02/15 POF, gave pt AVS and Calendar.Cherylann Banas, sent

## 2016-02-02 NOTE — Telephone Encounter (Signed)
Pt confirmed labs/ov per 02/15 POF, gave pt AVS and Calendar.... KJ, sent msg to add chemo and also sent msg to MD confirming a time on 03/01 and referral to radiation in workqueue.... KJ

## 2016-02-03 ENCOUNTER — Ambulatory Visit (HOSPITAL_BASED_OUTPATIENT_CLINIC_OR_DEPARTMENT_OTHER): Payer: Medicare Other

## 2016-02-03 VITALS — BP 135/68 | HR 103 | Temp 98.0°F | Resp 18 | Ht 66.0 in | Wt 145.0 lb

## 2016-02-03 DIAGNOSIS — C3411 Malignant neoplasm of upper lobe, right bronchus or lung: Secondary | ICD-10-CM

## 2016-02-03 DIAGNOSIS — Z5112 Encounter for antineoplastic immunotherapy: Secondary | ICD-10-CM | POA: Diagnosis not present

## 2016-02-03 DIAGNOSIS — C7951 Secondary malignant neoplasm of bone: Secondary | ICD-10-CM

## 2016-02-03 DIAGNOSIS — Z006 Encounter for examination for normal comparison and control in clinical research program: Secondary | ICD-10-CM

## 2016-02-03 MED ORDER — SODIUM CHLORIDE 0.9 % IV SOLN
Freq: Once | INTRAVENOUS | Status: AC
  Administered 2016-02-03: 15:00:00 via INTRAVENOUS

## 2016-02-03 MED ORDER — SODIUM CHLORIDE 0.9 % IV SOLN
240.0000 mg | Freq: Once | INTRAVENOUS | Status: AC
  Administered 2016-02-03: 240 mg via INTRAVENOUS
  Filled 2016-02-03: qty 24

## 2016-02-03 NOTE — Patient Instructions (Signed)
[facility] Discharge Instructions for Patients Receiving Chemotherapy  Today you received the following chemotherapy agents Nivolumab  To help prevent nausea and vomiting after your treatment, we encourage you to take your nausea medication     If you develop nausea and vomiting that is not controlled by your nausea medication, call the clinic.   BELOW ARE SYMPTOMS THAT SHOULD BE REPORTED IMMEDIATELY:  *FEVER GREATER THAN 100.5 F  *CHILLS WITH OR WITHOUT FEVER  NAUSEA AND VOMITING THAT IS NOT CONTROLLED WITH YOUR NAUSEA MEDICATION  *UNUSUAL SHORTNESS OF BREATH  *UNUSUAL BRUISING OR BLEEDING  TENDERNESS IN MOUTH AND THROAT WITH OR WITHOUT PRESENCE OF ULCERS  *URINARY PROBLEMS  *BOWEL PROBLEMS  UNUSUAL RASH Items with * indicate a potential emergency and should be followed up as soon as possible.  Feel free to call the clinic you have any questions or concerns. The clinic phone number is [phone].  Please show the CHEMO ALERT CARD at check-in to the Emergency Department and triage nurse.   

## 2016-02-04 ENCOUNTER — Telehealth: Payer: Self-pay | Admitting: Internal Medicine

## 2016-02-04 NOTE — Telephone Encounter (Signed)
Lft msg for pt confirming labs/ov added with infusion also per MD staff msg ok to add NP due to no availability... KJ mailed out schedule

## 2016-02-07 ENCOUNTER — Ambulatory Visit
Admission: RE | Admit: 2016-02-07 | Discharge: 2016-02-07 | Disposition: A | Discharge status: Home / Self Care | Payer: Medicare Other | Source: Ambulatory Visit | Attending: Radiation Oncology | Admitting: Radiation Oncology

## 2016-02-07 ENCOUNTER — Encounter: Payer: Self-pay | Admitting: Radiation Oncology

## 2016-02-07 VITALS — BP 150/68 | HR 98 | Resp 16 | Ht 66.0 in | Wt 146.4 lb

## 2016-02-07 DIAGNOSIS — Z7984 Long term (current) use of oral hypoglycemic drugs: Secondary | ICD-10-CM | POA: Diagnosis not present

## 2016-02-07 DIAGNOSIS — Z510 Encounter for antineoplastic radiation therapy: Secondary | ICD-10-CM | POA: Insufficient documentation

## 2016-02-07 DIAGNOSIS — C7951 Secondary malignant neoplasm of bone: Secondary | ICD-10-CM

## 2016-02-07 DIAGNOSIS — I2510 Atherosclerotic heart disease of native coronary artery without angina pectoris: Secondary | ICD-10-CM | POA: Diagnosis not present

## 2016-02-07 DIAGNOSIS — C3411 Malignant neoplasm of upper lobe, right bronchus or lung: Secondary | ICD-10-CM | POA: Insufficient documentation

## 2016-02-07 DIAGNOSIS — E119 Type 2 diabetes mellitus without complications: Secondary | ICD-10-CM | POA: Diagnosis not present

## 2016-02-07 DIAGNOSIS — K219 Gastro-esophageal reflux disease without esophagitis: Secondary | ICD-10-CM | POA: Diagnosis not present

## 2016-02-07 DIAGNOSIS — Z9221 Personal history of antineoplastic chemotherapy: Secondary | ICD-10-CM | POA: Diagnosis not present

## 2016-02-07 DIAGNOSIS — Z7982 Long term (current) use of aspirin: Secondary | ICD-10-CM | POA: Diagnosis not present

## 2016-02-07 DIAGNOSIS — I10 Essential (primary) hypertension: Secondary | ICD-10-CM | POA: Insufficient documentation

## 2016-02-07 DIAGNOSIS — Z87891 Personal history of nicotine dependence: Secondary | ICD-10-CM | POA: Insufficient documentation

## 2016-02-07 DIAGNOSIS — H3530 Unspecified macular degeneration: Secondary | ICD-10-CM | POA: Insufficient documentation

## 2016-02-07 HISTORY — DX: Malignant neoplasm of unspecified part of unspecified bronchus or lung: C34.90

## 2016-02-07 HISTORY — DX: Malignant neoplasm of bone and articular cartilage, unspecified: C41.9

## 2016-02-07 NOTE — Progress Notes (Signed)
Histology and Location of Primary Cancer: Stage IV non small cell lung cancer, adenocarcinoma diagnosed in January 2016 in addition to mediastinal lymphadenopathy and bone metastasis  Sites of Visceral and Bony Metastatic Disease: mediastinal lymphadenopathy, right glenoid process of scapula, and right hemipelvis  Location(s) of Symptomatic Metastases: right shoulder  Past/Anticipated chemotherapy by medical oncology, if any:  PRIOR THERAPY:  1. Status post Pleurx catheter placement by Dr. Servando Snare for drainage of the right pleural effusion.  2. Systemic chemotherapy with carboplatin for AUC of 5 and Alimta 500 MG/M2 every 3 weeks. Status post 6 cycles. CURRENT THERAPY: Patient is participating in the [employer] clinical trial and has been randomized to group A arm B-3 to receive Nivolumab 240 mg given every 2 weeks. First cycle expected 06/10/2015. Status post 17 cycles.   Pain on a scale of 0-10 is: Presently, right shoulder pain is 3 on a scale of 0-10 but, with movement that increases. Reports he takes Aleve to manage this pain. Reports his right hip tingling when he puts on his pants in the morning that resolves without intervention.    If Spine Met(s), symptoms, if any, include:  Bowel/Bladder retention or incontinence (please describe): No  Numbness or weakness in extremities (please describe): No  Current Decadron regimen, if applicable: No  Ambulatory status? Walker? Wheelchair?: Ambulatory  SAFETY ISSUES:  Prior radiation? no   Pacemaker/ICD? no  Possible current pregnancy? no  Is the patient on methotrexate? no  Current Complaints / other details:  81 year old male. Married for 33 years. Palliative radiotherapy may require holding his treatment (immunotherapy) for the week before and week after his radiation. Consulted originally by [name] 12/31/2014.  Two brothers-lung ca (both deceased). Father-pancreatic (deceased)

## 2016-02-07 NOTE — Progress Notes (Signed)
See progress note under physician encounter. 

## 2016-02-07 NOTE — Progress Notes (Signed)
Radiation Oncology         [phone] ________________________________  Initial outpatient Consultation  Name: Omar Peters MRN: [id number]  Date: 02/07/2016  DOB: 09-Nov-1935  WU:JWJXBJ,YNWGN, MD  Rigoberto Noel, MD   REFERRING PHYSICIAN: Rigoberto Noel, MD  DIAGNOSIS: The encounter diagnosis was Cancer of upper lobe of right lung [facility]).    ICD-9-CM ICD-10-CM   1. Cancer of upper lobe of right lung (HCC) 162.3 C34.11     HISTORY OF PRESENT ILLNESS: Omar Peters is a 81 y.o. male seen at the request of Dr. Julien Nordmann for consideration of palliative radiotherapy to the right glenoid region. 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 He was originally diagnosed with his cancer in January 2016. He was found on CT imaging to have a right upper lobe mass with mediastinal adenopathy, a large right effusion and bone metastases. He has finished Alimta and carboplatin for a total of 6 cycles and is currently on a clinical trial with immunotherapy. He has received 17 courses of immunotherapy under the Checkmate 370 trial. He had a restaging study on 01/26/2016 revealing persistence of his right upper lobe disease measuring 6.5 x 4.2 cm, and a 4.3 cm lesion of the medial aspect of the right ilium, and a mass in the right glenoid region measuring 3.8 x 3 cm consistent with metastatic disease. Due to progressive pain in his right shoulder, Dr. Julien Nordmann talk to the patient about proceceding with palliative radiotherapy for pain management. He comes to discuss this further with [name].  PREVIOUS RADIATION THERAPY: No  PAST MEDICAL HISTORY:  Past Medical History  Diagnosis Date  . Hypertension   . GERD (gastroesophageal reflux disease)   . Shortness of breath dyspnea     01/14/15 at night and has to sit up  . Anxiety     due to diagnosis of Stage 4 Lung Cancer  . Macular degeneration of right eye   . Diabetes mellitus without complication  ([place])   . Lung cancer ([place])     stage IV non small cell lung cancer dx. January 2016   . Bone cancer ([place])     non small cell lung with mets to bone      PAST SURGICAL HISTORY: Past Surgical History  Procedure Laterality Date  . Vein ligation and stripping  1977  . Chest tube insertion Right 01/15/2015    Procedure: INSERTION PLEURAL DRAINAGE CATHETER WITH ULTRASOUND AND FLURO GUIDANCE;  Surgeon: Grace Isaac, MD;  Location: [facility];  Service: Thoracic;  Laterality: Right;  . Removal of pleural drainage catheter Right 03/26/2015    Procedure: REMOVAL OF PLEURAL DRAINAGE CATHETER;  Surgeon: Grace Isaac, MD;  Location: [facility];  Service: Thoracic;  Laterality: Right;    FAMILY HISTORY:  Family History  Problem Relation Age of Onset  . Stroke Mother   . Cancer Father     pancreatic  . Cancer Brother     lung  . Cancer Brother     lung    SOCIAL HISTORY:  Social History   Social History  . Marital Status: Married    Spouse Name: N/A  . Number of Children: N/A  . Years of Education: N/A   Occupational History  . Not on file.   Social History Main Topics  . Smoking status: Former Smoker -- 1.00 packs/day for 50 years    Quit date: 12/14/2014  .  Smokeless tobacco: Former Systems developer    Quit date: 12/14/2014     Comment: 4-5 cig day  . Alcohol Use: 4.2 oz/week    7 Shots of liquor per week     Comment: 2-3  beers day  . Drug Use: No  . Sexual Activity: Not on file   Other Topics Concern  . Not on file   Social History Narrative    ALLERGIES: Amitriptyline and Marinol  MEDICATIONS:  Current Outpatient Prescriptions  Medication Sig Dispense Refill  . amLODipine (NORVASC) 10 MG tablet Take 10 mg by mouth daily.    Marland Kitchen aspirin 81 MG tablet Take 81 mg by mouth daily.    . B Complex-Biotin-FA (B-COMPLEX PO) Take 1 each by mouth daily.    . metFORMIN (GLUCOPHAGE) 500 MG tablet Take 500 mg by mouth daily.    . Naproxen Sodium (ALEVE PO) Take 1 tablet by mouth daily as  needed.    Marland Kitchen OVER THE COUNTER MEDICATION Apply 1 patch topically daily as needed. Reported on 01/05/2016    . ranitidine (ZANTAC) 150 MG tablet Take 150 mg by mouth daily.    Marland Kitchen senna-docusate (SENNA S) 8.6-50 MG per tablet Take 2 tablets by mouth daily.    Marland Kitchen HYDROcodone-acetaminophen (NORCO) 5-325 MG per tablet Take 1 tablet by mouth every 6 (six) hours as needed for moderate pain. (Patient not taking: Reported on 02/02/2016) 30 tablet 0  . prochlorperazine (COMPAZINE) 10 MG tablet TAKE ONE TABLET BY MOUTH EVERY 6 HOURS AS NEEDED FOR NAUSEA AND VOMITING (Patient not taking: Reported on 02/02/2016) 30 tablet 0   No current facility-administered medications for this encounter.    REVIEW OF SYSTEMS:  On review of systems the patient reports that overall he is doing okay and states that he has been managing his pain with oral Aleve. He states that this pain that he is experiencing bothers her mostly at night but is aggravated by activity. He states that he is able to pull objects but experiences significant pain with resistance when pushing. He is unable to abduct his shoulder when trying to come his hair. He states that this is limited due to pain rather than inability from lack of range of motion. He is able to get dressed and states that his hip pain only bothers him when he is putting on his pants. He denies any numbness or tingling in his extremities. He denies any bowel or bladder dysfunction. He has not expensing chest pain, shortness of breath, fevers or chills. He denies any proptosis and states that he does have an occasional cough during the day but this is nonproductive. He is trying to gain weight back since his original diagnosis and originally lost about 26 pounds on chemotherapy and his wife states he's up to about 8 pounds of gained from this deficit. A complete review of systems is obtained and is otherwise negative.   PHYSICAL EXAM:  height is '5\' 6"'$  (1.676 m) and weight is 146 lb 6.4 oz  (66.407 kg). His blood pressure is 150/68 and his pulse is 98. His respiration is 16 and oxygen saturation is 100%.   Pain Scale 3/10  In general this is a well-appearing Caucasian male in no acute distress. He is alert and oriented 4 and appropriate throughout the examination. HEENT reveals normocephalic atraumatic, EOMs are intact. Lymphatic review is performed and does not reveal any palpable supraclavicular cervical or axillary adenopathy. Along the mid outlined in the side detainee is tissues there is what  appears to be a sebaceous cyst that is mobile and nontender. Central punctate is evaluated but no evidence of infectious process is identified. Cardiovascular exam reveals a regular rate and rhythm, no clicks rubs or murmurs are auscultated. Chest is clear to auscultation bilaterally. Full range of motion is noted of the right upper extremity but this is limited by pain with abduction at 90 with flexion. The left upper extremity does not experience any resistance or pain. 4+ grip strength is noted bilaterally, and testing resistance when pushing with the right upper extremity versus left reveals 2+ strength versus 4+, and pulling is 4+ bilaterally. No gait disturbances are appreciated and no palpable pain is noted over the react crests bilaterally. No evidence of pretibial pitting edema or deep calf tenderness is noted. The abdomen is intact with active bowel sounds in all quadrants, is soft, nontender, nondistended.  KPS = 80  100 - Normal; no complaints; no evidence of disease. 90   - Able to carry on normal activity; minor signs or symptoms of disease. 80   - Normal activity with effort; some signs or symptoms of disease. 55   - Cares for self; unable to carry on normal activity or to do active work. 60   - Requires occasional assistance, but is able to care for most of his personal needs. 50   - Requires considerable assistance and frequent medical care. 16   - Disabled; requires special  care and assistance. 41   - Severely disabled; hospital admission is indicated although death not imminent. 16   - Very sick; hospital admission necessary; active supportive treatment necessary. 10   - Moribund; fatal processes progressing rapidly. 0     - Dead  Karnofsky DA, Abelmann [place], Craver LS and [place] [facility] [phone]) The use of the nitrogen mustards in the palliative treatment of carcinoma: with particular reference to bronchogenic carcinoma Cancer 1 [id number]  LABORATORY DATA:  Lab Results  Component Value Date   WBC 8.8 02/02/2016   HGB 15.8 02/02/2016   HCT 45.6 02/02/2016   MCV 97.6 02/02/2016   PLT 184 02/02/2016   Lab Results  Component Value Date   NA 140 02/02/2016   K 3.6 02/02/2016   CL 105 12/02/2015   CO2 29 02/02/2016   Lab Results  Component Value Date   ALT 17 02/02/2016   AST 20 02/02/2016   ALKPHOS 101 02/02/2016   BILITOT 0.38 02/02/2016     RADIOGRAPHY: Ct Chest W Contrast  01/26/2016  CLINICAL DATA:  81 year old male with history of lung cancer with bone metastasis diagnosed in January 2016. Chemotherapy ongoing. RECIST protocol. EXAM: CT CHEST, ABDOMEN, AND PELVIS WITH CONTRAST TECHNIQUE: Multidetector CT imaging of the chest, abdomen and pelvis was performed following the standard protocol during bolus administration of intravenous contrast. CONTRAST:  186m OMNIPAQUE IOHEXOL 300 MG/ML  SOLN COMPARISON:  CT of the chest, abdomen and pelvis 12/01/2015. FINDINGS: RECIST 1.1 Target Lesions: 1. Previously noted right upper lobe mass measures approximately 6.5 x 4.2 cm (image 19 of series 2). This measurement is larger than prior exam 12/01/2015, but it is largely reflective of differences in measurement technique. The mass is very indistinct and difficult to separate from the adjacent collapsed/consolidated lung, and overall is favored to be relatively stable in size compared to the prior examination. Non-target Lesions: 1. AP window lymph node measuring 9 mm  (image 23 of series 2), unchanged. 2. Subcarinal lymph node measuring 9 mm (image 27 of series 2), unchanged. 3.  Multiple lytic osseous lesions again noted, largest of which is in the medial aspect of the right ilium measuring up to 4.3 cm (image 91 of series 2), increased from 3.9 cm on prior exam 12/01/2015. 4. Chronic small right-sided enhancing pleural effusion, unchanged. 5. Low right paratracheal lymph node measuring 8 mm in short axis (image 23 of series 2), unchanged. 6. Tiny sub cm nodules in the left lower lobe are unchanged, and highly likely to be benign areas of mucoid impaction (image 37 of series 4). CT CHEST FINDINGS Mediastinum/Lymph Nodes: Multiple nonenlarged lymph nodes are again noted throughout the mediastinal and hilar nodal stations, unchanged compared to prior examinations, largest of which include a 9 mm subcarinal and AP window lymph nodes. Heart size is normal. There is no significant pericardial fluid, thickening or pericardial calcification. There is atherosclerosis of the thoracic aorta, the great vessels of the mediastinum and the coronary arteries, including calcified atherosclerotic plaque in the left main, left anterior descending and right coronary arteries. Dependent secretions in the distal trachea and proximal right mainstem bronchus. Extensive soft tissue thickening in the right hilar region, similar to prior examinations. Lungs/Pleura: Previously noted right upper lobe mass measures approximately 6.5 x 4.2 cm (image 19 of series 2). This measurement is larger than prior exam 12/01/2015, but it is largely reflective of differences in measurement technique. The mass is very indistinct and difficult to separate from the adjacent collapsed/consolidated lung, and overall is favored to be relatively stable in size compared to the prior examination. There continues to be extensive architectural distortion surrounding this mass, presumably evolving postradiation changes. There is  increasing septal thickening and thickening of the peribronchovascular interstitium diffusely throughout the right lung, which is concerning for lymphangitic spread of tumor. In addition, there is a pleural-based mass-like opacity in the posterior aspect of the right lower lobe which is unchanged over several recent prior examinations, measuring approximately 5.8 x 1.9 cm (image 36 of series 4), favored to represent an area of rounded atelectasis. Mild diffuse bronchial wall thickening with moderate centrilobular and mild paraseptal emphysema. Small chronic partially loculated thick-walled enhancing pleural effusion is unchanged, presumably malignant. Musculoskeletal/Soft Tissues: Multiple ill-defined lytic lesions are again noted, the largest of which is in the right glenoid where there is an expansile lesion that measures approximately 3.8 x 3.0 cm (image 8 of series 4), which appears slightly larger than the prior examination. CT ABDOMEN AND PELVIS FINDINGS Hepatobiliary: The liver has a shrunken appearance and nodular contour, however with underlying cirrhosis. No definite cystic or solid hepatic lesions. No intrahepatic biliary ductal dilatation. Common bile duct is dilated measuring 14 mm in the porta hepatis, only slightly increased compared to the prior study (13 mm on the prior). Gallbladder wall appears mildly thickened and demonstrates avid enhancement (5 mm). No gallstones. No surrounding inflammatory changes. Pancreas: No pancreatic mass. No pancreatic ductal dilatation. No pancreatic or peripancreatic fluid or inflammatory changes. Spleen: Unremarkable. Adrenals/Urinary Tract: Bilateral adrenal glands are normal in appearance. Sub cm low-attenuation lesions in the right kidney are too small to definitively characterize, but unchanged compared to prior examinations, likely cysts. Left kidney is normal in appearance. No hydroureteronephrosis. Urinary bladder is normal in appearance. Stomach/Bowel: The  stomach is normal in appearance. No pathologic dilatation of small bowel or colon. Normal appendix. Vascular/Lymphatic: Extensive atherosclerosis throughout the abdominal and pelvic vasculature, without evidence of aneurysm or dissection. No lymphadenopathy noted in the abdomen or pelvis. Reproductive: Prostate gland and seminal vesicles are unremarkable in appearance. Other:  No significant volume of ascites.  No pneumoperitoneum. Musculoskeletal: Numerous lytic lesions are again noted throughout the visualized skeleton, the largest of which are in the right hemipelvis in the superior aspect of the right ilium. The largest of these measures 4.3 x 2.4 cm (image 91 of series 2), slightly larger than the prior examination. The adjacent smaller lesion is also larger than the prior examination measuring 19 x 22 mm on today's study (previously 17 x 11 mm on 12/01/2015). IMPRESSION: 1. Slight progression of metastatic disease to the bones, as detailed above. 2. Otherwise, the examination appears stable compared to the prior study, including the primary target lesion in the right upper lobe (despite differences in measurement technique), as discussed above. 3. Continued dilatation of the common bile duct, very similar to prior studies, of uncertain etiology and significance. No associated intrahepatic biliary ductal dilatation to suggest frank obstruction. There continues to be a relatively contracted and slightly thick-walled enhancing gallbladder, which is also unchanged. No definite gallbladder wall mass identified at this time. 4. Stigmata of cirrhosis redemonstrated. 5. Extensive atherosclerosis, including left main and 2 vessel coronary artery disease. 6. Additional incidental findings, as above. Electronically Signed   By: Vinnie Langton M.D.   On: 01/26/2016 10:15   Ct Abdomen Pelvis W Contrast  01/26/2016  CLINICAL DATA:  81 year old male with history of lung cancer with bone metastasis diagnosed in January  2016. Chemotherapy ongoing. RECIST protocol. EXAM: CT CHEST, ABDOMEN, AND PELVIS WITH CONTRAST TECHNIQUE: Multidetector CT imaging of the chest, abdomen and pelvis was performed following the standard protocol during bolus administration of intravenous contrast. CONTRAST:  153m OMNIPAQUE IOHEXOL 300 MG/ML  SOLN COMPARISON:  CT of the chest, abdomen and pelvis 12/01/2015. FINDINGS: RECIST 1.1 Target Lesions: 1. Previously noted right upper lobe mass measures approximately 6.5 x 4.2 cm (image 19 of series 2). This measurement is larger than prior exam 12/01/2015, but it is largely reflective of differences in measurement technique. The mass is very indistinct and difficult to separate from the adjacent collapsed/consolidated lung, and overall is favored to be relatively stable in size compared to the prior examination. Non-target Lesions: 1. AP window lymph node measuring 9 mm (image 23 of series 2), unchanged. 2. Subcarinal lymph node measuring 9 mm (image 27 of series 2), unchanged. 3. Multiple lytic osseous lesions again noted, largest of which is in the medial aspect of the right ilium measuring up to 4.3 cm (image 91 of series 2), increased from 3.9 cm on prior exam 12/01/2015. 4. Chronic small right-sided enhancing pleural effusion, unchanged. 5. Low right paratracheal lymph node measuring 8 mm in short axis (image 23 of series 2), unchanged. 6. Tiny sub cm nodules in the left lower lobe are unchanged, and highly likely to be benign areas of mucoid impaction (image 37 of series 4). CT CHEST FINDINGS Mediastinum/Lymph Nodes: Multiple nonenlarged lymph nodes are again noted throughout the mediastinal and hilar nodal stations, unchanged compared to prior examinations, largest of which include a 9 mm subcarinal and AP window lymph nodes. Heart size is normal. There is no significant pericardial fluid, thickening or pericardial calcification. There is atherosclerosis of the thoracic aorta, the great vessels of the  mediastinum and the coronary arteries, including calcified atherosclerotic plaque in the left main, left anterior descending and right coronary arteries. Dependent secretions in the distal trachea and proximal right mainstem bronchus. Extensive soft tissue thickening in the right hilar region, similar to prior examinations. Lungs/Pleura: Previously noted right upper lobe mass measures  approximately 6.5 x 4.2 cm (image 19 of series 2). This measurement is larger than prior exam 12/01/2015, but it is largely reflective of differences in measurement technique. The mass is very indistinct and difficult to separate from the adjacent collapsed/consolidated lung, and overall is favored to be relatively stable in size compared to the prior examination. There continues to be extensive architectural distortion surrounding this mass, presumably evolving postradiation changes. There is increasing septal thickening and thickening of the peribronchovascular interstitium diffusely throughout the right lung, which is concerning for lymphangitic spread of tumor. In addition, there is a pleural-based mass-like opacity in the posterior aspect of the right lower lobe which is unchanged over several recent prior examinations, measuring approximately 5.8 x 1.9 cm (image 36 of series 4), favored to represent an area of rounded atelectasis. Mild diffuse bronchial wall thickening with moderate centrilobular and mild paraseptal emphysema. Small chronic partially loculated thick-walled enhancing pleural effusion is unchanged, presumably malignant. Musculoskeletal/Soft Tissues: Multiple ill-defined lytic lesions are again noted, the largest of which is in the right glenoid where there is an expansile lesion that measures approximately 3.8 x 3.0 cm (image 8 of series 4), which appears slightly larger than the prior examination. CT ABDOMEN AND PELVIS FINDINGS Hepatobiliary: The liver has a shrunken appearance and nodular contour, however with  underlying cirrhosis. No definite cystic or solid hepatic lesions. No intrahepatic biliary ductal dilatation. Common bile duct is dilated measuring 14 mm in the porta hepatis, only slightly increased compared to the prior study (13 mm on the prior). Gallbladder wall appears mildly thickened and demonstrates avid enhancement (5 mm). No gallstones. No surrounding inflammatory changes. Pancreas: No pancreatic mass. No pancreatic ductal dilatation. No pancreatic or peripancreatic fluid or inflammatory changes. Spleen: Unremarkable. Adrenals/Urinary Tract: Bilateral adrenal glands are normal in appearance. Sub cm low-attenuation lesions in the right kidney are too small to definitively characterize, but unchanged compared to prior examinations, likely cysts. Left kidney is normal in appearance. No hydroureteronephrosis. Urinary bladder is normal in appearance. Stomach/Bowel: The stomach is normal in appearance. No pathologic dilatation of small bowel or colon. Normal appendix. Vascular/Lymphatic: Extensive atherosclerosis throughout the abdominal and pelvic vasculature, without evidence of aneurysm or dissection. No lymphadenopathy noted in the abdomen or pelvis. Reproductive: Prostate gland and seminal vesicles are unremarkable in appearance. Other: No significant volume of ascites.  No pneumoperitoneum. Musculoskeletal: Numerous lytic lesions are again noted throughout the visualized skeleton, the largest of which are in the right hemipelvis in the superior aspect of the right ilium. The largest of these measures 4.3 x 2.4 cm (image 91 of series 2), slightly larger than the prior examination. The adjacent smaller lesion is also larger than the prior examination measuring 19 x 22 mm on today's study (previously 17 x 11 mm on 12/01/2015). IMPRESSION: 1. Slight progression of metastatic disease to the bones, as detailed above. 2. Otherwise, the examination appears stable compared to the prior study, including the primary  target lesion in the right upper lobe (despite differences in measurement technique), as discussed above. 3. Continued dilatation of the common bile duct, very similar to prior studies, of uncertain etiology and significance. No associated intrahepatic biliary ductal dilatation to suggest frank obstruction. There continues to be a relatively contracted and slightly thick-walled enhancing gallbladder, which is also unchanged. No definite gallbladder wall mass identified at this time. 4. Stigmata of cirrhosis redemonstrated. 5. Extensive atherosclerosis, including left main and 2 vessel coronary artery disease. 6. Additional incidental findings, as above. Electronically Signed  By: Vinnie Langton M.D.   On: 01/26/2016 10:15      IMPRESSION: 81 year old male with a history of a stage IV T3 and 3 M1 B non-small cell carcinoma of the right lung with metastatic disease to the right glenoid and right ilium  PLAN: [name] discusses with the patient the rationale for proceeding with palliative radiotherapy to the right glenoid fossa, and right iliac region. He discusses with the patient that as he is on a clinical trial we will respect the parameters of the trial and plan for simulation later this week on Thursday, February 23. We would attempt to proceed with radiation on Monday, February 27, and he would have to wait a full week before resuming his immunotherapy following completion of this. [name] has recommended four fractions to a total of 20 Gy. Risks, benefits, and schedule were detailed with the patient. At the end of the conversation he is ready to move forward.  The above documentation reflects my direct findings during this shared patient visit. Please see the separate note by [name] on this date for the remainder of the patient's plan of care.  Carola Rhine, PAC

## 2016-02-08 NOTE — Patient Instructions (Signed)
Contact our office if you have any questions following today's appointment: [phone].  

## 2016-02-10 ENCOUNTER — Ambulatory Visit
Admission: RE | Admit: 2016-02-10 | Discharge: 2016-02-10 | Disposition: A | Discharge status: Home / Self Care | Payer: Medicare Other | Source: Ambulatory Visit | Attending: Radiation Oncology | Admitting: Radiation Oncology

## 2016-02-10 DIAGNOSIS — C7951 Secondary malignant neoplasm of bone: Secondary | ICD-10-CM

## 2016-02-10 DIAGNOSIS — Z510 Encounter for antineoplastic radiation therapy: Secondary | ICD-10-CM | POA: Diagnosis not present

## 2016-02-10 NOTE — Progress Notes (Signed)
  Radiation Oncology         [phone] ________________________________  Name: Omar Peters MRN: [id number]  Date: 02/10/2016  DOB: [birth year]  SIMULATION AND TREATMENT PLANNING NOTE    ICD-9-CM ICD-10-CM   1. 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  Identity was confirmed.  All relevant records and images related to the planned course of therapy were reviewed.  The patient freely provided informed written consent to proceed with treatment after reviewing the details related to the planned course of therapy. The consent form was witnessed and verified by the simulation staff.  Then, the patient was set-up in a stable reproducible  supine position for radiation therapy.  CT images were obtained.  Surface markings were placed.  The CT images were loaded into the planning software.  Then the target and avoidance structures were contoured.  Treatment planning then occurred.  The radiation prescription was entered and confirmed.  Then, I designed and supervised the construction of a total of 5 medically necessary complex treatment devices.  I have requested : 3D Simulation  I have requested a DVH of the following structures: lungs, bladder, rt femur.  PLAN:  The patient will receive 20 Gy in 5 fractions.  ________________________________  Sheral Apley Tammi Klippel, M.D.\

## 2016-02-14 ENCOUNTER — Ambulatory Visit
Admission: RE | Admit: 2016-02-14 | Discharge: 2016-02-14 | Disposition: A | Discharge status: Home / Self Care | Payer: Medicare Other | Source: Ambulatory Visit | Attending: Radiation Oncology | Admitting: Radiation Oncology

## 2016-02-14 ENCOUNTER — Telehealth: Payer: Self-pay | Admitting: Medical Oncology

## 2016-02-14 NOTE — Telephone Encounter (Signed)
[id number]: Informed patient's spouse, Caryl Asp,  that I confirmed with Dr. Julien Nordmann today to cancel appt for Wednesday 03/01 lab/NP/tx due to patient receiving radiation this week. And, per study, Nivolumab is to be held for a least 1 week after radiation. Patient has scheduled appointment on March 15th for labs/MD/tx, which gives patient 11 days prior to scheduled Nivolumab treatment to be assessed by MD for restart. Spouse gave verbal understanding and confirms understanding of reason this weeks appt cancellation. All questions answered. Patient and spouse encouraged to call Dr. Julien Nordmann or myself with any questions or concerns.  Adele Dan, RN, BSN Clinical Research 02/14/2016 4:53 PM

## 2016-02-15 ENCOUNTER — Ambulatory Visit
Admission: RE | Admit: 2016-02-15 | Discharge: 2016-02-15 | Disposition: A | Discharge status: Home / Self Care | Payer: Medicare Other | Source: Ambulatory Visit | Attending: Radiation Oncology | Admitting: Radiation Oncology

## 2016-02-15 DIAGNOSIS — Z510 Encounter for antineoplastic radiation therapy: Secondary | ICD-10-CM | POA: Diagnosis not present

## 2016-02-16 ENCOUNTER — Ambulatory Visit: Payer: Medicare Other

## 2016-02-16 ENCOUNTER — Ambulatory Visit
Admission: RE | Admit: 2016-02-16 | Discharge: 2016-02-16 | Disposition: A | Discharge status: Home / Self Care | Payer: Medicare Other | Source: Ambulatory Visit | Attending: Radiation Oncology | Admitting: Radiation Oncology

## 2016-02-16 ENCOUNTER — Other Ambulatory Visit: Payer: Medicare Other

## 2016-02-16 ENCOUNTER — Ambulatory Visit: Payer: Medicare Other | Admitting: Nurse Practitioner

## 2016-02-16 DIAGNOSIS — Z510 Encounter for antineoplastic radiation therapy: Secondary | ICD-10-CM | POA: Diagnosis not present

## 2016-02-17 ENCOUNTER — Ambulatory Visit
Admission: RE | Admit: 2016-02-17 | Discharge: 2016-02-17 | Disposition: A | Discharge status: Home / Self Care | Payer: Medicare Other | Source: Ambulatory Visit | Attending: Radiation Oncology | Admitting: Radiation Oncology

## 2016-02-17 ENCOUNTER — Ambulatory Visit: Payer: Medicare Other

## 2016-02-17 ENCOUNTER — Encounter: Payer: Self-pay | Admitting: Radiation Oncology

## 2016-02-17 VITALS — BP 133/65 | HR 107 | Resp 16 | Wt 147.1 lb

## 2016-02-17 DIAGNOSIS — C7951 Secondary malignant neoplasm of bone: Secondary | ICD-10-CM

## 2016-02-17 DIAGNOSIS — Z510 Encounter for antineoplastic radiation therapy: Secondary | ICD-10-CM | POA: Diagnosis not present

## 2016-02-17 NOTE — Progress Notes (Signed)
Weight and vitals stable. Reports the pain in his right shoulder has greatly improved since starting radiation therapy. Reports he has only had to take Aleve once this week to relieve right shoulder pain. Denies right shoulder or hip pain presently. Reports he is able to lift his right leg higher than he was prior to starting radiation. Understands to continue use of radiaplex bid as directed. Scheduled for chemotherapy on 03/01/2016. One month follow up appointment card not given. Will consult with [name] about follow up date.   BP 133/65 mmHg  Pulse 107  Resp 16  Wt 147 lb 1.6 oz (66.724 kg)  SpO2 100% Wt Readings from Last 3 Encounters:  02/17/16 147 lb 1.6 oz (66.724 kg)  02/07/16 146 lb 6.4 oz (66.407 kg)  02/03/16 145 lb (65.772 kg)

## 2016-02-17 NOTE — Progress Notes (Signed)
.  No diagnosis found. Radiation Oncology         [phone]   Name: Omar Peters MRN: [id number]   Date: 02/17/2016  DOB: Feb 08, 1935    Weekly Radiation Therapy Management   DIAGNOSIS: 81 year old male with a history of a stage IV  non-small cell carcinoma of the right lung with metastatic disease to the right glenoid and right ilium   Current Dose: 20 Gy  Planned Dose:  20 Gy  Narrative The patient presents for routine under treatment assessment.  Weight and vitals stable. Reports the pain in his right shoulder has greatly improved since starting radiation therapy. Reports he has only had to take Aleve once this week to relieve right shoulder pain. Denies right shoulder or hip pain during this encounter. Reports he is able to lift his right leg higher than he was prior to starting radiation. Denies nausea, emesis, or diarrhea.  Set-up films were reviewed. The chart was checked.  Physical Findings  weight is 147 lb 1.6 oz (66.724 kg). His blood pressure is 133/65 and his pulse is 107. His respiration is 16 and oxygen saturation is 100%. . Weight essentially stable.  No significant changes. Better range of motion of the patient's right arm. He is able to raise it above his head.  Impression The patient tolerated radiation.  Plan Treatment is completed. He understands to continue using radiaplex bid as directed. Scheduled for chemotherapy on 03/01/2016. A one month follow up appointment card was given. He will follow up on April 4.    -----------------------------------  Blair Promise, PhD, MD  This document serves as a record of services personally performed by Gery Pray, MD. It was created on his behalf by Darcus Austin, a trained medical scribe. The creation of this record is based on the scribe's personal observations and the provider's statements to them. This document has been checked and approved by the attending provider.

## 2016-02-18 ENCOUNTER — Encounter: Payer: Self-pay | Admitting: Radiation Oncology

## 2016-02-18 ENCOUNTER — Ambulatory Visit
Admission: RE | Admit: 2016-02-18 | Discharge: 2016-02-18 | Disposition: A | Discharge status: Home / Self Care | Payer: Medicare Other | Source: Ambulatory Visit | Attending: Radiation Oncology | Admitting: Radiation Oncology

## 2016-02-18 DIAGNOSIS — Z510 Encounter for antineoplastic radiation therapy: Secondary | ICD-10-CM | POA: Diagnosis not present

## 2016-02-27 NOTE — Progress Notes (Signed)
  Radiation Oncology         [phone] ________________________________  Name: DELAINE CANTER MRN: [id number]  Date: 02/18/2016  DOB: May 02, 1935  End of Treatment Note  ICD-9-CM ICD-10-CM     1. Bone metastasis (HCC) 198.5 C79.51     DIAGNOSIS: 81 year old male with a history of a stage IV T3 N3 M1b non-small cell carcinoma of the right lung with metastatic disease to the right glenoid and right ilium     Indication for treatment:  Palliation       Radiation treatment dates:   02/14/2016-02/18/2016  Site/dose:    1.  The right ilium metastasis was treated to 20 Gy in 4 fractions of 5 Gy 2.  The right glenoid metastasis was treated to 20 Gy in 4 fractions of 5 Gy  Beams/energy:    1.  The right ilium metastasis was treated using a 4-field technique with 6 and 10 MV X-rays 2.  The right glenoid metastasis was treated using a 4-field technique with 6 and 10 MV X-rays  Narrative: The patient tolerated radiation treatment relatively well.   No acute complications occurred.  Pain improved.  Plan: The patient has completed radiation treatment. The patient will return to radiation oncology clinic for routine followup in one month. I advised him to call or return sooner if he has any questions or concerns related to his recovery or treatment. ________________________________  Sheral Apley. Tammi Klippel, M.D.

## 2016-02-28 ENCOUNTER — Other Ambulatory Visit: Payer: Self-pay | Admitting: Medical Oncology

## 2016-02-28 DIAGNOSIS — Z79899 Other long term (current) drug therapy: Secondary | ICD-10-CM

## 2016-02-28 DIAGNOSIS — C3411 Malignant neoplasm of upper lobe, right bronchus or lung: Secondary | ICD-10-CM

## 2016-03-01 ENCOUNTER — Ambulatory Visit (HOSPITAL_BASED_OUTPATIENT_CLINIC_OR_DEPARTMENT_OTHER): Payer: Medicare Other | Admitting: Internal Medicine

## 2016-03-01 ENCOUNTER — Encounter: Payer: Self-pay | Admitting: *Deleted

## 2016-03-01 ENCOUNTER — Telehealth: Payer: Self-pay | Admitting: *Deleted

## 2016-03-01 ENCOUNTER — Other Ambulatory Visit: Payer: Self-pay | Admitting: Medical Oncology

## 2016-03-01 ENCOUNTER — Other Ambulatory Visit (HOSPITAL_COMMUNITY)
Admission: RE | Admit: 2016-03-01 | Discharge: 2016-03-01 | Disposition: A | Discharge status: Home / Self Care | Payer: Medicare Other | Source: Ambulatory Visit | Attending: Internal Medicine | Admitting: Internal Medicine

## 2016-03-01 ENCOUNTER — Encounter: Payer: Self-pay | Admitting: Medical Oncology

## 2016-03-01 ENCOUNTER — Encounter: Payer: Self-pay | Admitting: Oncology

## 2016-03-01 ENCOUNTER — Ambulatory Visit (HOSPITAL_BASED_OUTPATIENT_CLINIC_OR_DEPARTMENT_OTHER): Payer: Medicare Other

## 2016-03-01 ENCOUNTER — Encounter: Payer: Self-pay | Admitting: Internal Medicine

## 2016-03-01 VITALS — BP 146/63 | HR 103 | Temp 98.0°F | Resp 17 | Wt 146.3 lb

## 2016-03-01 DIAGNOSIS — Z006 Encounter for examination for normal comparison and control in clinical research program: Secondary | ICD-10-CM

## 2016-03-01 DIAGNOSIS — C7951 Secondary malignant neoplasm of bone: Secondary | ICD-10-CM

## 2016-03-01 DIAGNOSIS — C3411 Malignant neoplasm of upper lobe, right bronchus or lung: Secondary | ICD-10-CM

## 2016-03-01 DIAGNOSIS — Z5112 Encounter for antineoplastic immunotherapy: Secondary | ICD-10-CM | POA: Diagnosis not present

## 2016-03-01 DIAGNOSIS — Z79899 Other long term (current) drug therapy: Secondary | ICD-10-CM

## 2016-03-01 LAB — COMPREHENSIVE METABOLIC PANEL
ALBUMIN: 3.3 g/dL — AB (ref 3.5–5.0)
ALT: 20 U/L (ref 0–55)
AST: 18 U/L (ref 5–34)
Alkaline Phosphatase: 109 U/L (ref 40–150)
Anion Gap: 9 mEq/L (ref 3–11)
BUN: 12.9 mg/dL (ref 7.0–26.0)
CHLORIDE: 104 meq/L (ref 98–109)
CO2: 27 mEq/L (ref 22–29)
CREATININE: 0.9 mg/dL (ref 0.7–1.3)
Calcium: 9.4 mg/dL (ref 8.4–10.4)
EGFR: 83 mL/min/{1.73_m2} — ABNORMAL LOW (ref >90)
GLUCOSE: 138 mg/dL (ref 70–140)
POTASSIUM: 3.5 meq/L (ref 3.5–5.1)
SODIUM: 140 meq/L (ref 136–145)
Total Bilirubin: 0.38 mg/dL (ref 0.20–1.20)
Total Protein: 7.4 g/dL (ref 6.4–8.3)

## 2016-03-01 LAB — CBC WITH DIFFERENTIAL/PLATELET
BASO%: 0.2 % (ref 0.0–2.0)
BASOS ABS: 0 10*3/uL (ref 0.0–0.1)
EOS%: 5.5 % (ref 0.0–7.0)
Eosinophils Absolute: 0.4 10*3/uL (ref 0.0–0.5)
HCT: 46.4 % (ref 38.4–49.9)
HEMOGLOBIN: 15.4 g/dL (ref 13.0–17.1)
LYMPH#: 1.7 10*3/uL (ref 0.9–3.3)
LYMPH%: 24 % (ref 14.0–49.0)
MCH: 32.8 pg (ref 27.2–33.4)
MCHC: 33.1 g/dL (ref 32.0–36.0)
MCV: 99 fL — ABNORMAL HIGH (ref 79.3–98.0)
MONO#: 0.9 10*3/uL (ref 0.1–0.9)
MONO%: 12.5 % (ref 0.0–14.0)
NEUT%: 57.8 % (ref 39.0–75.0)
NEUTROS ABS: 4 10*3/uL (ref 1.5–6.5)
Platelets: 199 10*3/uL (ref 140–400)
RBC: 4.69 10*6/uL (ref 4.20–5.82)
RDW: 13.4 % (ref 11.0–14.6)
WBC: 6.9 10*3/uL (ref 4.0–10.3)

## 2016-03-01 LAB — PHOSPHORUS: PHOSPHORUS: 3.8 mg/dL (ref 2.5–4.6)

## 2016-03-01 LAB — LACTATE DEHYDROGENASE: LDH: 129 U/L (ref 125–245)

## 2016-03-01 LAB — MAGNESIUM: Magnesium: 1.9 mg/dl (ref 1.5–2.5)

## 2016-03-01 LAB — TSH: TSH: 4.462 m(IU)/L — ABNORMAL HIGH (ref 0.320–4.118)

## 2016-03-01 MED ORDER — SODIUM CHLORIDE 0.9 % IV SOLN
Freq: Once | INTRAVENOUS | Status: AC
  Administered 2016-03-01: 11:00:00 via INTRAVENOUS

## 2016-03-01 MED ORDER — INV-NIVOLUMAB CHEMO INJECTION 100 MG/10 ML BMS CA 209-370
240.0000 mg | Freq: Once | INTRAVENOUS | Status: AC
  Administered 2016-03-01: 240 mg via INTRAVENOUS
  Filled 2016-03-01: qty 24

## 2016-03-01 NOTE — Progress Notes (Signed)
BMS 370: Group A, Arm B-3 Cycle 19/Week 36 Patient return to clinic for start of Cycle 19. Patient had palliative radiation therapy and per protocol, Nivloumab was to be held week prior to radiation, week of and at least one week after completion. Patient held for appropriate time as instructed in study protocol. Last radiation treatment was 03/02. Today, return to clinic for labs, MD assessment and cycle 19. Resting V/S completed. PROs completed with research assistant Remer Macho. Patient reports that palliative radiation treatment to right shoulder and right hip has helped with ache and discomfort during treatment, but states that discomfort is still present and he continues with taking Aleve as needed. Patient also reports to have restarted taking Amitriptyline due to not sleeping well and states he started that the 3/16th. I discussed with the patient and spouse patient's intolerance to the medication and that it could have caused the mental confusion episode that placed him in the hospital. Spouse and patient gave verbal understanding and patient reports to be sleeping much better since restarting it, spouse confirms that patient has not had mental confusion at all since restarting. Education was provided to patient and spouse regarding the side effects of Amitriptyline. Patient reports no new symptoms/concerns. Patient denies: headaches, nausea, vomiting, bowel or bladder problems. Also denies rash/skin problems. Continues to report baseline decrease of appetite and occasional NP cough. Confirms ache to shoulder continues and takes Aleve as needed, at its worse is a 4 on pain scale of 10. Patient reports no side affects from radiation treatment. Per Dr. Worthy Flank physical assessment of patient, review of labs and NCS labs, patient ok for cycle 19 Nivolumab today. Study assigned vial numbers provided to PharmD Dene Gentry. Infusion treatment sign provided to RN Magdalene River with all questions answered.  Patient and spouse know to call Dr. Julien Nordmann or myself with any questions or concerns they may have. TSH level resulted slightly higher and outside of normal range, Free T4 and Free T3 ordered and not resulted at this time. Will follow. Adele Dan, RN, BSN Clinical Research 03/01/2016 11:27 AM

## 2016-03-01 NOTE — Progress Notes (Signed)
03/01/16 - BMS [id number] - Questionnaires (PRO's) - Patient into cancer center for routine visit.  Patient given PRO's upon arrival to the cancer center.  The PRO's were completed (EQ-5D-3L first and then FACT-L).  The PRO's were checked for completeness.  The patient was thanked for his continues support in this clinical trial. Omar Peters 03/01/16 - 10:00 am

## 2016-03-01 NOTE — Telephone Encounter (Signed)
Per staff message and POF I have scheduled appts. Advised scheduler of appts. JMW  

## 2016-03-01 NOTE — Patient Instructions (Signed)
[facility] Discharge Instructions for Patients Receiving Chemotherapy  Today you received the following chemotherapy agents Opdivo.  To help prevent nausea and vomiting after your treatment, we encourage you to take your nausea medication as directed.   If you develop nausea and vomiting that is not controlled by your nausea medication, call the clinic.   BELOW ARE SYMPTOMS THAT SHOULD BE REPORTED IMMEDIATELY:  *FEVER GREATER THAN 100.5 F  *CHILLS WITH OR WITHOUT FEVER  NAUSEA AND VOMITING THAT IS NOT CONTROLLED WITH YOUR NAUSEA MEDICATION  *UNUSUAL SHORTNESS OF BREATH  *UNUSUAL BRUISING OR BLEEDING  TENDERNESS IN MOUTH AND THROAT WITH OR WITHOUT PRESENCE OF ULCERS  *URINARY PROBLEMS  *BOWEL PROBLEMS  UNUSUAL RASH Items with * indicate a potential emergency and should be followed up as soon as possible.  Feel free to call the clinic you have any questions or concerns. The clinic phone number is [phone].  Please show the CHEMO ALERT CARD at check-in to the Emergency Department and triage nurse.    

## 2016-03-01 NOTE — Progress Notes (Signed)
[place] [phone]   [phone]) [place], MD [address] [place] [address]  DIAGNOSIS: Stage IV (T3, N3, M1b) non-small cell lung cancer, adenocarcinoma diagnosed in January 2016 presented with right upper lobe lung mass in addition to mediastinal lymphadenopathy and large right pleural effusion as well as bone metastasis.  PRIOR THERAPY:  1. Status post Pleurx catheter placement by Dr. Servando Snare for drainage of the right pleural effusion.  2. Systemic chemotherapy with carboplatin for AUC of 5 and Alimta 500 MG/M2 every 3 weeks. Status post 6 cycles. 3. Status post palliative radiotherapy to the metastatic bone lesion in the right glenoid as well as the right ilium area completed 02/18/2016  CURRENT THERAPY: Patient is participating in the [place] 370 clinical trial and has been randomized to group A arm B-3 to receive Nivolumab 240 mg given every 2 weeks. First cycle expected 06/10/2015. Status post 18 cycles.  INTERVAL HISTORY: Omar Peters 81 y.o. male returns to the clinic today for follow-up visit accompanied by his wife. The patient is feeling fine today with no specific complaints with no complaints today except for mild pain in the right shoulder and right hip area which is significantly improved compared to few weeks ago before starting radiotherapy. He takes ibuprofen on as-needed basis. He has been off treatment for the last few weeks during his radiotherapy. He has been tolerating his treatment with Nivolumab fairly well with no significant adverse effects. He denied having any significant fever or chills. He has no nausea, vomiting, diarrhea or constipation. The patient denied having any significant skin rash. He has no chest pain, shortness of breath, cough or hemoptysis. He is here today to resume his treatment with immunotherapy.   MEDICAL HISTORY: Past Medical History  Diagnosis Date  .  Hypertension   . GERD (gastroesophageal reflux disease)   . Shortness of breath dyspnea     01/14/15 at night and has to sit up  . Anxiety     due to diagnosis of Stage 4 Lung Cancer  . Macular degeneration of right eye   . Diabetes mellitus without complication ([place])   . Lung cancer ([place])     stage IV non small cell lung cancer dx. January 2016   . Bone cancer ([place])     non small cell lung with mets to bone    ALLERGIES:  is allergic to amitriptyline and marinol.  MEDICATIONS:  Current Outpatient Prescriptions  Medication Sig Dispense Refill  . amitriptyline (ELAVIL) 10 MG tablet Take 10 mg by mouth at bedtime.    Marland Kitchen amLODipine (NORVASC) 10 MG tablet Take 10 mg by mouth daily.    Marland Kitchen aspirin 81 MG tablet Take 81 mg by mouth daily.    . B Complex-Biotin-FA (B-COMPLEX PO) Take 1 each by mouth daily. Reported on 03/01/2016    . metFORMIN (GLUCOPHAGE) 500 MG tablet Take 500 mg by mouth daily.    . Naproxen Sodium (ALEVE PO) Take 1 tablet by mouth daily as needed.    . ranitidine (ZANTAC) 150 MG tablet Take 150 mg by mouth daily.    Marland Kitchen senna-docusate (SENNA S) 8.6-50 MG per tablet Take 2 tablets by mouth daily.    Marland Kitchen HYDROcodone-acetaminophen (NORCO) 5-325 MG per tablet Take 1 tablet by mouth every 6 (six) hours as needed for moderate pain. (Patient not taking: Reported on 02/02/2016) 30 tablet 0  . prochlorperazine (COMPAZINE) 10 MG  tablet TAKE ONE TABLET BY MOUTH EVERY 6 HOURS AS NEEDED FOR NAUSEA AND VOMITING (Patient not taking: Reported on 02/02/2016) 30 tablet 0   No current facility-administered medications for this visit.    SURGICAL HISTORY:  Past Surgical History  Procedure Laterality Date  . Vein ligation and stripping  1977  . Chest tube insertion Right 01/15/2015    Procedure: INSERTION PLEURAL DRAINAGE CATHETER WITH ULTRASOUND AND FLURO GUIDANCE;  Surgeon: Grace Isaac, MD;  Location: [facility];  Service: Thoracic;  Laterality: Right;  . Removal of pleural drainage catheter  Right 03/26/2015    Procedure: REMOVAL OF PLEURAL DRAINAGE CATHETER;  Surgeon: Grace Isaac, MD;  Location: [facility];  Service: Thoracic;  Laterality: Right;    REVIEW OF SYSTEMS:  Constitutional: positive for fatigue Eyes: negative Ears, nose, mouth, throat, and face: negative Respiratory: negative Cardiovascular: negative Gastrointestinal: negative Genitourinary:negative Integument/breast: negative Hematologic/lymphatic: negative Musculoskeletal:positive for bone pain Neurological: negative Behavioral/Psych: negative Endocrine: negative Allergic/Immunologic: negative   PHYSICAL EXAMINATION: General appearance: alert, cooperative and no distress Head: Normocephalic, without obvious abnormality, atraumatic Neck: no adenopathy, no JVD, supple, symmetrical, trachea midline and thyroid not enlarged, symmetric, no tenderness/mass/nodules Lymph nodes: Cervical, supraclavicular, and axillary nodes normal. Resp: clear to auscultation bilaterally Back: symmetric, no curvature. ROM normal. No CVA tenderness. Cardio: regular rate and rhythm, S1, S2 normal, no murmur, click, rub or gallop GI: soft, non-tender; bowel sounds normal; no masses,  no organomegaly Extremities: extremities normal, atraumatic, no cyanosis or edema Neurologic: Alert and oriented X 3, normal strength and tone. Normal symmetric reflexes. Normal coordination and gait  ECOG PERFORMANCE STATUS: 1 - Symptomatic but completely ambulatory  Blood pressure 146/63, pulse 103, temperature 98 F (36.7 C), temperature source Oral, resp. rate 17, weight 146 lb 4.8 oz (66.361 kg), SpO2 96 %.  LABORATORY DATA: Lab Results  Component Value Date   WBC 6.9 03/01/2016   HGB 15.4 03/01/2016   HCT 46.4 03/01/2016   MCV 99.0* 03/01/2016   PLT 199 03/01/2016      Chemistry      Component Value Date/Time   NA 140 02/02/2016 1153   NA 140 12/02/2015 0400   K 3.6 02/02/2016 1153   K 3.4* 12/02/2015 0400   CL 105 12/02/2015 0400     CO2 29 02/02/2016 1153   CO2 28 12/02/2015 0400   BUN 11.2 02/02/2016 1153   BUN 8 12/02/2015 0400   CREATININE 0.9 02/02/2016 1153   CREATININE 0.76 12/02/2015 0400   GLU 231* 03/10/2015 1539      Component Value Date/Time   CALCIUM 9.8 02/02/2016 1153   CALCIUM 8.8* 12/02/2015 0400   ALKPHOS 101 02/02/2016 1153   ALKPHOS 71 12/02/2015 0400   AST 20 02/02/2016 1153   AST 18 12/02/2015 0400   ALT 17 02/02/2016 1153   ALT 16* 12/02/2015 0400   BILITOT 0.38 02/02/2016 1153   BILITOT 0.7 12/02/2015 0400       RADIOGRAPHIC STUDIES: No results found.  ASSESSMENT AND PLAN: This is a very pleasant 81 years old white male with history of stage IV non-small cell lung cancer, adenocarcinoma status post induction chemotherapy was carboplatin and Alimta and currently undergoing treatment with immunotherapy according to the BMS checkmated 370 clinical trial. He was randomized to the Nivolumab arm 240 mg IV every 2 weeks. He is status post 18 cycles and tolerating his treatment fairly well. He recently completed a course of palliative radiotherapy to the right glenoid and right ilium with some improvement  of his pain management. We will resume his treatment with Nivolumab today with cycle #19 as a scheduled. The patient would come back for follow-up visit in 2 weeks for reevaluation. I may consider him for treatment with Xgeva for bone metastasis as it is no contraindication with the clinical trial protocol. The patient was advised to call immediately if he has any concerning symptoms in the interval. The patient voices understanding of current disease status and treatment options and is in agreement with the current care plan.  All questions were answered. The patient knows to call the clinic with any problems, questions or concerns. We can certainly see the patient much sooner if necessary.  Disclaimer: This note was dictated with voice recognition software. Similar sounding words can  inadvertently be transcribed and may not be corrected upon review.

## 2016-03-01 NOTE — Progress Notes (Signed)
Oncology Nurse Navigator Documentation  Oncology Nurse Navigator Flowsheets 03/01/2016  Navigator Location [facility]Med [facility]  Navigator Encounter Type Treatment/spoke with Mr and Mrs. Wallick today during his treatment.  He is on Opdivo and is tolerating well.  He is also getting XRT to right shoulder due to mets.  He is also tolerating that well.  No questions or concerns noted today.    Abnormal Finding Date 12/15/2014  Confirmed Diagnosis Date 01/15/2015  Treatment Initiated Date 01/20/2015  Patient Visit Type MedOnc  Treatment Phase Treatment  Barriers/Navigation Needs No barriers at this time  Interventions None required  Acuity Level 1  Time Spent with Patient 30

## 2016-03-02 LAB — T3, FREE: T3 FREE: 2.6 pg/mL (ref 2.0–4.4)

## 2016-03-02 LAB — T4, FREE: T4,Free(Direct): 1.1 ng/dL (ref 0.82–1.77)

## 2016-03-02 NOTE — Progress Notes (Unsigned)
BMS 370: Group A, Arm B-3 Cycle 19/Week 36 Correction to Research note made on 03/01/2016. Patient reports to have restarted Amitriptyline on 02/03/2016 to help him sleep.  Adele Dan, RN, BSN Clinical Research 03/02/2016 2:41 PM

## 2016-03-13 ENCOUNTER — Other Ambulatory Visit: Payer: Self-pay | Admitting: Internal Medicine

## 2016-03-13 DIAGNOSIS — J984 Other disorders of lung: Secondary | ICD-10-CM

## 2016-03-13 DIAGNOSIS — C3411 Malignant neoplasm of upper lobe, right bronchus or lung: Secondary | ICD-10-CM

## 2016-03-13 DIAGNOSIS — C7951 Secondary malignant neoplasm of bone: Secondary | ICD-10-CM

## 2016-03-14 ENCOUNTER — Other Ambulatory Visit: Payer: Self-pay | Admitting: Medical Oncology

## 2016-03-14 DIAGNOSIS — C3411 Malignant neoplasm of upper lobe, right bronchus or lung: Secondary | ICD-10-CM

## 2016-03-15 ENCOUNTER — Telehealth: Payer: Self-pay | Admitting: *Deleted

## 2016-03-15 ENCOUNTER — Telehealth: Payer: Self-pay | Admitting: Internal Medicine

## 2016-03-15 ENCOUNTER — Encounter: Payer: Self-pay | Admitting: Medical Oncology

## 2016-03-15 ENCOUNTER — Encounter: Payer: Self-pay | Admitting: Internal Medicine

## 2016-03-15 ENCOUNTER — Ambulatory Visit (HOSPITAL_BASED_OUTPATIENT_CLINIC_OR_DEPARTMENT_OTHER): Payer: Medicare Other | Admitting: Internal Medicine

## 2016-03-15 ENCOUNTER — Other Ambulatory Visit (HOSPITAL_COMMUNITY)
Admission: AD | Admit: 2016-03-15 | Discharge: 2016-03-15 | Disposition: A | Discharge status: Home / Self Care | Payer: Medicare Other | Source: Ambulatory Visit | Attending: Internal Medicine | Admitting: Internal Medicine

## 2016-03-15 ENCOUNTER — Other Ambulatory Visit (HOSPITAL_BASED_OUTPATIENT_CLINIC_OR_DEPARTMENT_OTHER): Payer: Medicare Other

## 2016-03-15 ENCOUNTER — Ambulatory Visit (HOSPITAL_BASED_OUTPATIENT_CLINIC_OR_DEPARTMENT_OTHER): Payer: Medicare Other

## 2016-03-15 VITALS — BP 142/64 | HR 97 | Temp 98.4°F | Resp 17 | Ht 66.0 in | Wt 144.4 lb

## 2016-03-15 DIAGNOSIS — Z5112 Encounter for antineoplastic immunotherapy: Secondary | ICD-10-CM

## 2016-03-15 DIAGNOSIS — C7951 Secondary malignant neoplasm of bone: Secondary | ICD-10-CM

## 2016-03-15 DIAGNOSIS — C3411 Malignant neoplasm of upper lobe, right bronchus or lung: Secondary | ICD-10-CM | POA: Diagnosis present

## 2016-03-15 DIAGNOSIS — J90 Pleural effusion, not elsewhere classified: Secondary | ICD-10-CM

## 2016-03-15 DIAGNOSIS — Z006 Encounter for examination for normal comparison and control in clinical research program: Secondary | ICD-10-CM

## 2016-03-15 LAB — COMPREHENSIVE METABOLIC PANEL
ALT: 18 U/L (ref 0–55)
AST: 18 U/L (ref 5–34)
Albumin: 3.2 g/dL — ABNORMAL LOW (ref 3.5–5.0)
Alkaline Phosphatase: 94 U/L (ref 40–150)
Anion Gap: 9 mEq/L (ref 3–11)
BILIRUBIN TOTAL: 0.41 mg/dL (ref 0.20–1.20)
BUN: 9.8 mg/dL (ref 7.0–26.0)
CHLORIDE: 101 meq/L (ref 98–109)
CO2: 29 meq/L (ref 22–29)
Calcium: 9.7 mg/dL (ref 8.4–10.4)
Creatinine: 0.9 mg/dL (ref 0.7–1.3)
EGFR: 83 mL/min/{1.73_m2} — AB (ref >90)
GLUCOSE: 178 mg/dL — AB (ref 70–140)
POTASSIUM: 3.3 meq/L — AB (ref 3.5–5.1)
SODIUM: 139 meq/L (ref 136–145)
Total Protein: 7.5 g/dL (ref 6.4–8.3)

## 2016-03-15 LAB — CBC WITH DIFFERENTIAL/PLATELET
BASO%: 1.2 % (ref 0.0–2.0)
BASOS ABS: 0.1 10*3/uL (ref 0.0–0.1)
EOS%: 4.2 % (ref 0.0–7.0)
Eosinophils Absolute: 0.3 10*3/uL (ref 0.0–0.5)
HCT: 46.9 % (ref 38.4–49.9)
HEMOGLOBIN: 15.6 g/dL (ref 13.0–17.1)
LYMPH%: 21.6 % (ref 14.0–49.0)
MCH: 32.8 pg (ref 27.2–33.4)
MCHC: 33.2 g/dL (ref 32.0–36.0)
MCV: 98.8 fL — ABNORMAL HIGH (ref 79.3–98.0)
MONO#: 0.9 10*3/uL (ref 0.1–0.9)
MONO%: 11.8 % (ref 0.0–14.0)
NEUT#: 4.4 10*3/uL (ref 1.5–6.5)
NEUT%: 61.2 % (ref 39.0–75.0)
Platelets: 204 10*3/uL (ref 140–400)
RBC: 4.75 10*6/uL (ref 4.20–5.82)
RDW: 13.8 % (ref 11.0–14.6)
WBC: 7.3 10*3/uL (ref 4.0–10.3)
lymph#: 1.6 10*3/uL (ref 0.9–3.3)

## 2016-03-15 LAB — MAGNESIUM: MAGNESIUM: 1.9 mg/dL (ref 1.5–2.5)

## 2016-03-15 LAB — LACTATE DEHYDROGENASE: LDH: 177 U/L (ref 125–245)

## 2016-03-15 LAB — PHOSPHORUS: Phosphorus: 2.8 mg/dL (ref 2.5–4.6)

## 2016-03-15 MED ORDER — INV-NIVOLUMAB CHEMO INJECTION 100 MG/10 ML BMS CA 209-370
240.0000 mg | Freq: Once | INTRAVENOUS | Status: AC
  Administered 2016-03-15: 240 mg via INTRAVENOUS
  Filled 2016-03-15: qty 24

## 2016-03-15 MED ORDER — SODIUM CHLORIDE 0.9 % IV SOLN
Freq: Once | INTRAVENOUS | Status: AC
  Administered 2016-03-15: 12:00:00 via INTRAVENOUS

## 2016-03-15 NOTE — Progress Notes (Signed)
[id number]: Group A, Arm B3 cycle 20/week 38 Patient here today for labs, office visit and cycle 20 of Nivolumab on study. Resting vitals completed, O2 saturation at 97%. Concomitant medications reviewed and patient denies any new medications or changes to meds. Patient denies any concerns or adverse events. Patient does report that right shoulder pain has improved much and has caused him very little discomfort, reports no pain today to right shoulder or right hip. Patient denies nausea or vomiting. Continues with his baseline bowel habits. Denies SOB, coughing or wheezing. Denies any neurological issues. Patient continues to report his energy level the same,  not worse, but not better. Patient also reporting no change/improvement in appetite, has been maintaining  his weight. Potassium resulted at 3.3 today and per MD, patient was encouraged to add potassium enriched foods to diet. Spouse and patient report to have a list of potassium enriched foods at home and gave verbal confirmation that they will do so. Per Dr. Worthy Flank assessment of patient and patient's labs, as well as, NCS labs, patient cleared for cycle 20 of Nivolumab treatment today. Study generated vial assignment provided to PharmD Dene Gentry, infusion treatment sign provided to [facility] RN, in treatment room, with all questions answered.  Patient is aware of CT scan scheduled for 03/22/2016, contrast given. I thanked patient and spouse for their time and continued support of study and encouraged them to call Dr. Julien Nordmann or myself with any questions or concerns they may have. They deny further questions at this visit. Adele Dan, RN, BSN Clinical Research 03/15/2016 1:55 PM

## 2016-03-15 NOTE — Progress Notes (Signed)
[facility] Telephone:([phone]) [phone]   Fax:(336) [place], [place] [address] [place] [id number]  DIAGNOSIS: Stage IV (T3, N3, M1b) non-small cell lung cancer, adenocarcinoma diagnosed in January 2016 presented with right upper lobe lung mass in addition to mediastinal lymphadenopathy and large right pleural effusion as well as bone metastasis.  PRIOR THERAPY:  1. Status post Pleurx catheter placement by Dr. Servando Snare for drainage of the right pleural effusion.  2. Systemic chemotherapy with carboplatin for AUC of 5 and Alimta 500 MG/M2 every 3 weeks. Status post 6 cycles. 3. Status post palliative radiotherapy to the metastatic bone lesion in the right glenoid as well as the right ilium area completed 02/18/2016  CURRENT THERAPY: Patient is participating in the [place] 370 clinical trial and has been randomized to group A arm B-3 to receive Nivolumab 240 mg given every 2 weeks. First cycle expected 06/10/2015. Status post 19 cycles.  INTERVAL HISTORY: Omar Peters 81 y.o. male returns to the clinic today for follow-up visit accompanied by his wife. The patient is feeling much better today. The pain in the right shoulder has significantly improved and he continues to have mild pain in the right hip area. He tolerated the last cycle of his treatment with Nivolumab fairly well with no significant adverse effects. He denied having any significant fever or chills. He has no nausea, vomiting, diarrhea or constipation. The patient denied having any significant skin rash. He has no chest pain, shortness of breath, cough or hemoptysis. He is here today to start cycle #20 his treatment with Nivolumab.   MEDICAL HISTORY: Past Medical History  Diagnosis Date  . Hypertension   . GERD (gastroesophageal reflux disease)   . Shortness of breath dyspnea     01/14/15 at night and has to sit up  . Anxiety     due to diagnosis of Stage 4 Lung  Cancer  . Macular degeneration of right eye   . Diabetes mellitus without complication ([place])   . Lung cancer ([place])     stage IV non small cell lung cancer dx. January 2016   . Bone cancer ([place])     non small cell lung with mets to bone    ALLERGIES:  is allergic to amitriptyline and marinol.  MEDICATIONS:  Current Outpatient Prescriptions  Medication Sig Dispense Refill  . amitriptyline (ELAVIL) 10 MG tablet Take 10 mg by mouth at bedtime.    Marland Kitchen amLODipine (NORVASC) 10 MG tablet Take 10 mg by mouth daily.    Marland Kitchen aspirin 81 MG tablet Take 81 mg by mouth daily.    . B Complex-Biotin-FA (B-COMPLEX PO) Take 1 each by mouth daily. Reported on 03/01/2016    . HYDROcodone-acetaminophen (NORCO) 5-325 MG per tablet Take 1 tablet by mouth every 6 (six) hours as needed for moderate pain. (Patient not taking: Reported on 02/02/2016) 30 tablet 0  . metFORMIN (GLUCOPHAGE) 500 MG tablet Take 500 mg by mouth daily.    . Naproxen Sodium (ALEVE PO) Take 1 tablet by mouth daily as needed.    . prochlorperazine (COMPAZINE) 10 MG tablet TAKE ONE TABLET BY MOUTH EVERY 6 HOURS AS NEEDED FOR NAUSEA AND VOMITING (Patient not taking: Reported on 02/02/2016) 30 tablet 0  . ranitidine (ZANTAC) 150 MG tablet Take 150 mg by mouth daily.    Marland Kitchen senna-docusate (SENNA S) 8.6-50 MG per tablet Take 2 tablets by mouth daily.  No current facility-administered medications for this visit.    SURGICAL HISTORY:  Past Surgical History  Procedure Laterality Date  . Vein ligation and stripping  1977  . Chest tube insertion Right 01/15/2015    Procedure: INSERTION PLEURAL DRAINAGE CATHETER WITH ULTRASOUND AND FLURO GUIDANCE;  Surgeon: Grace Isaac, MD;  Location: [facility];  Service: Thoracic;  Laterality: Right;  . Removal of pleural drainage catheter Right 03/26/2015    Procedure: REMOVAL OF PLEURAL DRAINAGE CATHETER;  Surgeon: Grace Isaac, MD;  Location: [facility];  Service: Thoracic;  Laterality: Right;    REVIEW OF  SYSTEMS:  A comprehensive review of systems was negative except for: Constitutional: positive for fatigue Musculoskeletal: positive for bone pain   PHYSICAL EXAMINATION: General appearance: alert, cooperative and no distress Head: Normocephalic, without obvious abnormality, atraumatic Neck: no adenopathy, no JVD, supple, symmetrical, trachea midline and thyroid not enlarged, symmetric, no tenderness/mass/nodules Lymph nodes: Cervical, supraclavicular, and axillary nodes normal. Resp: clear to auscultation bilaterally Back: symmetric, no curvature. ROM normal. No CVA tenderness. Cardio: regular rate and rhythm, S1, S2 normal, no murmur, click, rub or gallop GI: soft, non-tender; bowel sounds normal; no masses,  no organomegaly Extremities: extremities normal, atraumatic, no cyanosis or edema Neurologic: Alert and oriented X 3, normal strength and tone. Normal symmetric reflexes. Normal coordination and gait  ECOG PERFORMANCE STATUS: 1 - Symptomatic but completely ambulatory  Blood pressure 142/64, pulse 97, temperature 98.4 F (36.9 C), temperature source Oral, resp. rate 17, height '5\' 6"'$  (1.676 m), weight 144 lb 6.4 oz (65.499 kg), SpO2 97 %.  LABORATORY DATA: Lab Results  Component Value Date   WBC 7.3 03/15/2016   HGB 15.6 03/15/2016   HCT 46.9 03/15/2016   MCV 98.8* 03/15/2016   PLT 204 03/15/2016      Chemistry      Component Value Date/Time   NA 139 03/15/2016 1031   NA 140 12/02/2015 0400   K 3.3* 03/15/2016 1031   K 3.4* 12/02/2015 0400   CL 105 12/02/2015 0400   CO2 29 03/15/2016 1031   CO2 28 12/02/2015 0400   BUN 9.8 03/15/2016 1031   BUN 8 12/02/2015 0400   CREATININE 0.9 03/15/2016 1031   CREATININE 0.76 12/02/2015 0400   GLU 231* 03/10/2015 1539      Component Value Date/Time   CALCIUM 9.7 03/15/2016 1031   CALCIUM 8.8* 12/02/2015 0400   ALKPHOS 94 03/15/2016 1031   ALKPHOS 71 12/02/2015 0400   AST 18 03/15/2016 1031   AST 18 12/02/2015 0400   ALT 18  03/15/2016 1031   ALT 16* 12/02/2015 0400   BILITOT 0.41 03/15/2016 1031   BILITOT 0.7 12/02/2015 0400       RADIOGRAPHIC STUDIES: No results found.  ASSESSMENT AND PLAN: This is a very pleasant 81 years old white male with history of stage IV non-small cell lung cancer, adenocarcinoma status post induction chemotherapy was carboplatin and Alimta and currently undergoing treatment with immunotherapy according to the [employer] 370 clinical trial. He was randomized to the Nivolumab arm 240 mg IV every 2 weeks. He is status post 19 cycles and tolerating his treatment fairly well. The patient will proceed with cycle #20 of his immune therapy today as a scheduled. The patient would come back for follow-up visit in 2 weeks for reevaluation after repeating CT scan of the chest, abdomen and pelvis for restaging of his disease before starting cycle #21. I will not be able to start the patient on treatment  with Xgeva at this point because it would be consider deviation of the protocol that I may consider it in the future once the patient completes the clinical study. The patient was advised to call immediately if he has any concerning symptoms in the interval. The patient voices understanding of current disease status and treatment options and is in agreement with the current care plan.  All questions were answered. The patient knows to call the clinic with any problems, questions or concerns. We can certainly see the patient much sooner if necessary.  Disclaimer: This note was dictated with voice recognition software. Similar sounding words can inadvertently be transcribed and may not be corrected upon review.

## 2016-03-15 NOTE — Patient Instructions (Signed)
[facility] Discharge Instructions for Patients Receiving Chemotherapy  Today you received the following chemotherapy agents:  Nivolumab.  To help prevent nausea and vomiting after your treatment, we encourage you to take your nausea medication as directed.   If you develop nausea and vomiting that is not controlled by your nausea medication, call the clinic.   BELOW ARE SYMPTOMS THAT SHOULD BE REPORTED IMMEDIATELY:  *FEVER GREATER THAN 100.5 F  *CHILLS WITH OR WITHOUT FEVER  NAUSEA AND VOMITING THAT IS NOT CONTROLLED WITH YOUR NAUSEA MEDICATION  *UNUSUAL SHORTNESS OF BREATH  *UNUSUAL BRUISING OR BLEEDING  TENDERNESS IN MOUTH AND THROAT WITH OR WITHOUT PRESENCE OF ULCERS  *URINARY PROBLEMS  *BOWEL PROBLEMS  UNUSUAL RASH Items with * indicate a potential emergency and should be followed up as soon as possible.  Feel free to call the clinic you have any questions or concerns. The clinic phone number is [phone].  Please show the CHEMO ALERT CARD at check-in to the Emergency Department and triage nurse.   

## 2016-03-15 NOTE — Telephone Encounter (Signed)
Per staff message and POF I have scheduled appts. Advised scheduler of appts. JMW  

## 2016-03-15 NOTE — Progress Notes (Signed)
Ok to treat with potassium. Will replace with nutrition

## 2016-03-15 NOTE — Telephone Encounter (Signed)
per pof to sch pt appt-sent MW email to sch trmt-pt to get updated copy of avs

## 2016-03-21 ENCOUNTER — Ambulatory Visit: Payer: Self-pay | Admitting: Radiation Oncology

## 2016-03-22 ENCOUNTER — Encounter (HOSPITAL_COMMUNITY): Payer: Self-pay

## 2016-03-22 ENCOUNTER — Ambulatory Visit (HOSPITAL_COMMUNITY)
Admission: RE | Admit: 2016-03-22 | Discharge: 2016-03-22 | Disposition: A | Discharge status: Home / Self Care | Payer: Medicare Other | Source: Ambulatory Visit | Attending: Internal Medicine | Admitting: Internal Medicine

## 2016-03-22 DIAGNOSIS — C3411 Malignant neoplasm of upper lobe, right bronchus or lung: Secondary | ICD-10-CM | POA: Insufficient documentation

## 2016-03-22 DIAGNOSIS — J90 Pleural effusion, not elsewhere classified: Secondary | ICD-10-CM | POA: Diagnosis not present

## 2016-03-22 DIAGNOSIS — Z9221 Personal history of antineoplastic chemotherapy: Secondary | ICD-10-CM | POA: Diagnosis not present

## 2016-03-22 DIAGNOSIS — K838 Other specified diseases of biliary tract: Secondary | ICD-10-CM | POA: Insufficient documentation

## 2016-03-22 DIAGNOSIS — K7460 Unspecified cirrhosis of liver: Secondary | ICD-10-CM | POA: Insufficient documentation

## 2016-03-22 DIAGNOSIS — C7951 Secondary malignant neoplasm of bone: Secondary | ICD-10-CM

## 2016-03-22 DIAGNOSIS — J984 Other disorders of lung: Secondary | ICD-10-CM

## 2016-03-22 DIAGNOSIS — R918 Other nonspecific abnormal finding of lung field: Secondary | ICD-10-CM | POA: Insufficient documentation

## 2016-03-22 MED ORDER — IOPAMIDOL (ISOVUE-300) INJECTION 61%
100.0000 mL | Freq: Once | INTRAVENOUS | Status: AC | PRN
  Administered 2016-03-22: 100 mL via INTRAVENOUS

## 2016-03-28 ENCOUNTER — Other Ambulatory Visit: Payer: Self-pay | Admitting: Medical Oncology

## 2016-03-28 DIAGNOSIS — C3411 Malignant neoplasm of upper lobe, right bronchus or lung: Secondary | ICD-10-CM

## 2016-03-29 ENCOUNTER — Other Ambulatory Visit (HOSPITAL_BASED_OUTPATIENT_CLINIC_OR_DEPARTMENT_OTHER): Payer: Medicare Other

## 2016-03-29 ENCOUNTER — Encounter: Payer: Self-pay | Admitting: Internal Medicine

## 2016-03-29 ENCOUNTER — Ambulatory Visit (HOSPITAL_BASED_OUTPATIENT_CLINIC_OR_DEPARTMENT_OTHER): Payer: Medicare Other

## 2016-03-29 ENCOUNTER — Ambulatory Visit
Admission: RE | Admit: 2016-03-29 | Discharge: 2016-03-29 | Disposition: A | Discharge status: Home / Self Care | Payer: Medicare Other | Source: Ambulatory Visit | Attending: Radiation Oncology | Admitting: Radiation Oncology

## 2016-03-29 ENCOUNTER — Encounter: Payer: Self-pay | Admitting: Radiation Oncology

## 2016-03-29 ENCOUNTER — Encounter: Payer: Self-pay | Admitting: Medical Oncology

## 2016-03-29 ENCOUNTER — Telehealth: Payer: Self-pay | Admitting: Internal Medicine

## 2016-03-29 ENCOUNTER — Other Ambulatory Visit (HOSPITAL_COMMUNITY)
Admission: AD | Admit: 2016-03-29 | Discharge: 2016-03-29 | Disposition: A | Discharge status: Home / Self Care | Payer: Medicare Other | Source: Ambulatory Visit | Attending: Internal Medicine | Admitting: Internal Medicine

## 2016-03-29 ENCOUNTER — Ambulatory Visit (HOSPITAL_BASED_OUTPATIENT_CLINIC_OR_DEPARTMENT_OTHER): Payer: Medicare Other | Admitting: Internal Medicine

## 2016-03-29 VITALS — BP 148/68 | HR 95 | Temp 98.5°F | Resp 18 | Ht 66.0 in | Wt 145.4 lb

## 2016-03-29 VITALS — BP 145/67 | HR 101 | Temp 98.3°F | Ht 66.0 in | Wt 145.9 lb

## 2016-03-29 DIAGNOSIS — Z006 Encounter for examination for normal comparison and control in clinical research program: Secondary | ICD-10-CM

## 2016-03-29 DIAGNOSIS — C7951 Secondary malignant neoplasm of bone: Secondary | ICD-10-CM

## 2016-03-29 DIAGNOSIS — C3411 Malignant neoplasm of upper lobe, right bronchus or lung: Secondary | ICD-10-CM

## 2016-03-29 DIAGNOSIS — R0602 Shortness of breath: Secondary | ICD-10-CM

## 2016-03-29 DIAGNOSIS — Z5112 Encounter for antineoplastic immunotherapy: Secondary | ICD-10-CM

## 2016-03-29 LAB — COMPREHENSIVE METABOLIC PANEL
ALT: 18 U/L (ref 0–55)
ANION GAP: 8 meq/L (ref 3–11)
AST: 21 U/L (ref 5–34)
Albumin: 3 g/dL — ABNORMAL LOW (ref 3.5–5.0)
Alkaline Phosphatase: 100 U/L (ref 40–150)
BILIRUBIN TOTAL: 0.37 mg/dL (ref 0.20–1.20)
BUN: 9.6 mg/dL (ref 7.0–26.0)
CALCIUM: 9.4 mg/dL (ref 8.4–10.4)
CO2: 27 meq/L (ref 22–29)
CREATININE: 0.8 mg/dL (ref 0.7–1.3)
Chloride: 105 mEq/L (ref 98–109)
EGFR: 85 mL/min/{1.73_m2} — ABNORMAL LOW (ref >90)
Glucose: 144 mg/dl — ABNORMAL HIGH (ref 70–140)
Potassium: 3.4 mEq/L — ABNORMAL LOW (ref 3.5–5.1)
Sodium: 140 mEq/L (ref 136–145)
TOTAL PROTEIN: 7.1 g/dL (ref 6.4–8.3)

## 2016-03-29 LAB — CBC WITH DIFFERENTIAL/PLATELET
BASO%: 0.6 % (ref 0.0–2.0)
Basophils Absolute: 0 10*3/uL (ref 0.0–0.1)
EOS%: 4.4 % (ref 0.0–7.0)
Eosinophils Absolute: 0.3 10*3/uL (ref 0.0–0.5)
HEMATOCRIT: 43.3 % (ref 38.4–49.9)
HGB: 15.1 g/dL (ref 13.0–17.1)
LYMPH#: 1.7 10*3/uL (ref 0.9–3.3)
LYMPH%: 24.1 % (ref 14.0–49.0)
MCH: 33.7 pg — ABNORMAL HIGH (ref 27.2–33.4)
MCHC: 34.9 g/dL (ref 32.0–36.0)
MCV: 96.7 fL (ref 79.3–98.0)
MONO#: 0.8 10*3/uL (ref 0.1–0.9)
MONO%: 12.2 % (ref 0.0–14.0)
NEUT%: 58.7 % (ref 39.0–75.0)
NEUTROS ABS: 4 10*3/uL (ref 1.5–6.5)
PLATELETS: 193 10*3/uL (ref 140–400)
RBC: 4.48 10*6/uL (ref 4.20–5.82)
RDW: 13.7 % (ref 11.0–14.6)
WBC: 6.9 10*3/uL (ref 4.0–10.3)

## 2016-03-29 LAB — PHOSPHORUS: Phosphorus: 3.2 mg/dL (ref 2.5–4.6)

## 2016-03-29 LAB — LACTATE DEHYDROGENASE: LDH: 120 U/L — AB (ref 125–245)

## 2016-03-29 LAB — MAGNESIUM: MAGNESIUM: 1.9 mg/dL (ref 1.5–2.5)

## 2016-03-29 MED ORDER — INV-NIVOLUMAB CHEMO INJECTION 100 MG/10 ML BMS CA 209-370
240.0000 mg | Freq: Once | INTRAVENOUS | Status: AC
  Administered 2016-03-29: 240 mg via INTRAVENOUS
  Filled 2016-03-29: qty 24

## 2016-03-29 MED ORDER — SODIUM CHLORIDE 0.9 % IV SOLN
Freq: Once | INTRAVENOUS | Status: AC
  Administered 2016-03-29: 12:00:00 via INTRAVENOUS

## 2016-03-29 NOTE — Progress Notes (Signed)
BMS 370: Group A, Arm B3: Cycle 21/week40 I met patient and spouse after his lab appointment and prior to obtaining V/S and weight. O2 sat @ 98%. Patient maintaining weight. Patient denies any changes or new additions to his concomitant medications. Patient denies any A/E's or side effects. Patient reports to "feel great" and spouse reports that patient is "eating better." Patient denies; nausea, vomiting, diarrhea, SOB or breathing difficulties. Patient also denies any new or unusual skin rashes or skin problems. Patient reports Right shoulder pain to be minimal and rarely uses any pain medication for it. Patient also reports no change in energy level, not worse but not better. Patient had CT scan last week (04/05) MD reviewed and gave patient results. Per MD, of today's assessment of patient, review of patient's labs as well as NCS labs and CT scan, patient cleared to complete cycle 21 today. Patient's potassium level at 3.4, per MD, patient was encouraged to add food's enriched with potassium in his diet, patient and spouse gave verbal understanding. All patient's questions answered to his satisfaction, patient and spouse encouraged to call Dr. Julien Nordmann or myself with any questions or concerns.  Study assigned vial assignment numbers provided to PharmD, [name]. Infusion treatment sign/instructions provided to Kathe Becton RN, all questions answered. Adele Dan, RN, BSN Clinical Research 03/29/2016 11:27 AM

## 2016-03-29 NOTE — Patient Instructions (Signed)
[facility] Discharge Instructions for Patients Receiving Chemotherapy  Today you received the following chemotherapy agents Nivolumab  To help prevent nausea and vomiting after your treatment, we encourage you to take your nausea medication     If you develop nausea and vomiting that is not controlled by your nausea medication, call the clinic.   BELOW ARE SYMPTOMS THAT SHOULD BE REPORTED IMMEDIATELY:  *FEVER GREATER THAN 100.5 F  *CHILLS WITH OR WITHOUT FEVER  NAUSEA AND VOMITING THAT IS NOT CONTROLLED WITH YOUR NAUSEA MEDICATION  *UNUSUAL SHORTNESS OF BREATH  *UNUSUAL BRUISING OR BLEEDING  TENDERNESS IN MOUTH AND THROAT WITH OR WITHOUT PRESENCE OF ULCERS  *URINARY PROBLEMS  *BOWEL PROBLEMS  UNUSUAL RASH Items with * indicate a potential emergency and should be followed up as soon as possible.  Feel free to call the clinic you have any questions or concerns. The clinic phone number is [phone].  Please show the CHEMO ALERT CARD at check-in to the Emergency Department and triage nurse.   

## 2016-03-29 NOTE — Telephone Encounter (Signed)
Extended pt sched out to 5/24

## 2016-03-29 NOTE — Progress Notes (Signed)
[place] [phone]   Fax:([phone]) [place], [place] [address] [place] [address]  DIAGNOSIS: Stage IV (T3, N3, M1b) non-small cell lung cancer, adenocarcinoma diagnosed in January 2016 presented with right upper lobe lung mass in addition to mediastinal lymphadenopathy and large right pleural effusion as well as bone metastasis.  PRIOR THERAPY:  1. Status post Pleurx catheter placement by Dr. Servando Snare for drainage of the right pleural effusion.  2. Systemic chemotherapy with carboplatin for AUC of 5 and Alimta 500 MG/M2 every 3 weeks. Status post 6 cycles. 3. Status post palliative radiotherapy to the metastatic bone lesion in the right glenoid as well as the right ilium area completed 02/18/2016  CURRENT THERAPY: Patient is participating in the [place] 370 clinical trial and has been randomized to group A arm B-3 to receive Nivolumab 240 mg given every 2 weeks. First cycle expected 06/10/2015. Status post 20 cycles.  INTERVAL HISTORY: Omar Peters 81 y.o. male returns to the clinic today for follow-up visit accompanied by his wife. The patient is feeling much better today. He denied having any bony pains. He tolerated the last cycle of his treatment with Nivolumab fairly well with no significant adverse effects. He denied having any significant fever or chills. He has no nausea, vomiting, diarrhea or constipation. The patient denied having any significant skin rash. He denied having any significant weight loss or night sweats. He has no chest pain, shortness of breath, cough or hemoptysis. He had repeat CT scan of the chest, abdomen and pelvis performed recently and he is here for evaluation and discussion of his scan results.  MEDICAL HISTORY: Past Medical History  Diagnosis Date  . Hypertension   . GERD (gastroesophageal reflux disease)   . Shortness of breath dyspnea     01/14/15 at night and has to sit up   . Anxiety     due to diagnosis of Stage 4 Lung Cancer  . Macular degeneration of right eye   . Diabetes mellitus without complication ([place])   . Lung cancer ([place])     stage IV non small cell lung cancer dx. January 2016   . Bone cancer ([place])     non small cell lung with mets to bone    ALLERGIES:  is allergic to amitriptyline and marinol.  MEDICATIONS:  Current Outpatient Prescriptions  Medication Sig Dispense Refill  . amitriptyline (ELAVIL) 10 MG tablet Take 10 mg by mouth at bedtime.    Marland Kitchen amLODipine (NORVASC) 10 MG tablet Take 10 mg by mouth daily.    Marland Kitchen aspirin 81 MG tablet Take 81 mg by mouth daily.    . B Complex-Biotin-FA (B-COMPLEX PO) Take 1 each by mouth daily. Reported on 03/01/2016    . HYDROcodone-acetaminophen (NORCO) 5-325 MG per tablet Take 1 tablet by mouth every 6 (six) hours as needed for moderate pain. (Patient not taking: Reported on 02/02/2016) 30 tablet 0  . metFORMIN (GLUCOPHAGE) 500 MG tablet Take 500 mg by mouth daily.    . Naproxen Sodium (ALEVE PO) Take 1 tablet by mouth daily as needed. Reported on 03/29/2016    . prochlorperazine (COMPAZINE) 10 MG tablet TAKE ONE TABLET BY MOUTH EVERY 6 HOURS AS NEEDED FOR NAUSEA AND VOMITING (Patient not taking: Reported on 02/02/2016) 30 tablet 0  . ranitidine (ZANTAC) 150 MG tablet Take 150 mg by mouth daily.    Marland Kitchen senna-docusate (SENNA S) 8.6-50 MG per  tablet Take 2 tablets by mouth daily.     No current facility-administered medications for this visit.    SURGICAL HISTORY:  Past Surgical History  Procedure Laterality Date  . Vein ligation and stripping  1977  . Chest tube insertion Right 01/15/2015    Procedure: INSERTION PLEURAL DRAINAGE CATHETER WITH ULTRASOUND AND FLURO GUIDANCE;  Surgeon: Grace Isaac, MD;  Location: [facility];  Service: Thoracic;  Laterality: Right;  . Removal of pleural drainage catheter Right 03/26/2015    Procedure: REMOVAL OF PLEURAL DRAINAGE CATHETER;  Surgeon: Grace Isaac, MD;   Location: [facility];  Service: Thoracic;  Laterality: Right;    REVIEW OF SYSTEMS:  Constitutional: negative Eyes: negative Ears, nose, mouth, throat, and face: negative Respiratory: negative Cardiovascular: negative Gastrointestinal: negative Genitourinary:negative Integument/breast: negative Hematologic/lymphatic: negative Musculoskeletal:negative Neurological: negative Behavioral/Psych: negative Endocrine: negative Allergic/Immunologic: negative   PHYSICAL EXAMINATION: General appearance: alert, cooperative and no distress Head: Normocephalic, without obvious abnormality, atraumatic Neck: no adenopathy, no JVD, supple, symmetrical, trachea midline and thyroid not enlarged, symmetric, no tenderness/mass/nodules Lymph nodes: Cervical, supraclavicular, and axillary nodes normal. Resp: clear to auscultation bilaterally Back: symmetric, no curvature. ROM normal. No CVA tenderness. Cardio: regular rate and rhythm, S1, S2 normal, no murmur, click, rub or gallop GI: soft, non-tender; bowel sounds normal; no masses,  no organomegaly Extremities: extremities normal, atraumatic, no cyanosis or edema Neurologic: Alert and oriented X 3, normal strength and tone. Normal symmetric reflexes. Normal coordination and gait  ECOG PERFORMANCE STATUS: 1 - Symptomatic but completely ambulatory  Blood pressure 148/68, pulse 95, temperature 98.5 F (36.9 C), temperature source Oral, resp. rate 18, height '5\' 6"'$  (1.676 m), weight 145 lb 6.4 oz (65.953 kg), SpO2 98 %.  LABORATORY DATA: Lab Results  Component Value Date   WBC 6.9 03/29/2016   HGB 15.1 03/29/2016   HCT 43.3 03/29/2016   MCV 96.7 03/29/2016   PLT 193 03/29/2016      Chemistry      Component Value Date/Time   NA 140 03/29/2016 0934   NA 140 12/02/2015 0400   K 3.4* 03/29/2016 0934   K 3.4* 12/02/2015 0400   CL 105 12/02/2015 0400   CO2 27 03/29/2016 0934   CO2 28 12/02/2015 0400   BUN 9.6 03/29/2016 0934   BUN 8 12/02/2015 0400    CREATININE 0.8 03/29/2016 0934   CREATININE 0.76 12/02/2015 0400   GLU 231* 03/10/2015 1539      Component Value Date/Time   CALCIUM 9.4 03/29/2016 0934   CALCIUM 8.8* 12/02/2015 0400   ALKPHOS 100 03/29/2016 0934   ALKPHOS 71 12/02/2015 0400   AST 21 03/29/2016 0934   AST 18 12/02/2015 0400   ALT 18 03/29/2016 0934   ALT 16* 12/02/2015 0400   BILITOT 0.37 03/29/2016 0934   BILITOT 0.7 12/02/2015 0400       RADIOGRAPHIC STUDIES: Ct Chest W Contrast  03/23/2016  ADDENDUM REPORT: 03/23/2016 12:10 ADDENDUM: The following should represent a non target lesion, rather than a second target lesion. Non target lesion # 8.: High right paratracheal node which measures 11 mm on image 11/series 2 versus 9 mm on the prior. Electronically Signed   By: Abigail Miyamoto M.D.   On: 03/23/2016 12:10  03/23/2016  CLINICAL DATA:  Right-sided lung cancer diagnosed 1/16. Restaging. Bone metastasis. Ongoing chemotherapy. EXAM: CT CHEST, ABDOMEN, AND PELVIS WITH CONTRAST TECHNIQUE: Multidetector CT imaging of the chest, abdomen and pelvis was performed following the standard protocol during bolus administration of intravenous  contrast. CONTRAST:  128m ISOVUE-300 IOPAMIDOL (ISOVUE-300) INJECTION 61% COMPARISON:  01/26/2016 FINDINGS: RECIST 1.1 Target Lesions: 1. Right upper lobe lung mass, 6.4 x 4.1 cm on image 16/series 2. 6.5 x 4.2 cm on the prior. 2. High right paratracheal node which measures 11 mm on image 11/series 2 versus 9 mm on the prior. Non-target Lesions: 1. AP window node of 6 mm on image 21/ series 2 versus 9 mL and the prior. 2. Subcarinal node which measures 9 mm on image 26/series 2. unchanged. 3. Similar right iliac metastasis, measuring maximally 4.3 cm. 4. Right pleural effusion, similar. 5. Low right paratracheal node measuring 8 mm on image 21/series 2 , similar. 6. left lower lobe pulmonary nodularity, likely due to mucoid impaction, similar. 7. Right glenoid metastasis, similar. CT CHEST FINDINGS  Mediastinum/Lymph Nodes: No supraclavicular adenopathy. Probable sebaceous cyst superficial to the left side of the hyoid cartilage. Example at 1.5 cm, similar. Aortic and branch vessel atherosclerosis. Normal heart size, without pericardial effusion. Multivessel coronary artery atherosclerosis. No central pulmonary embolism, on this non-dedicated study. Right paratracheal node measures 11 mm on image 11/series 2 versus 9 mm on the prior. Low right paratracheal node measures 8 mm on image 21/ series 2 and is unchanged. 9 mm subcarinal node is similar. Lungs/Pleura: Right-sided small volume pleural fluid with pleural thickening, similar. Diffuse right-sided septal thickening is similar. Dependent right lower lobe collapse/consolidative change is similar, including on image 59/series 4. Residual right upper lobe masslike consolidation measures on the order of 6.4 by 4.1 cm, including on image 16/series 2. Compare 6.5 x 4.2 cm on the prior. Musculoskeletal: Similar right glenoid metastasis, including on image 8/series 2 the. CT ABDOMEN PELVIS FINDINGS Hepatobiliary: Mild to moderate cirrhosis. Hyper enhancing gallbladder mucosa, as before. 1.3 cm common duct, similar. No obstructive stone or mass identified. Pancreas: Normal, without mass or ductal dilatation. Spleen: Normal in size, without focal abnormality. Adrenals/Urinary Tract: Normal adrenal glands. Too small to characterize left renal lesions. Normal right kidney. No hydronephrosis. Normal urinary bladder. Stomach/Bowel: Normal stomach, without wall thickening. Normal colon, appendix, and terminal ileum. Normal small bowel. Vascular/Lymphatic: Aortic and branch vessel atherosclerosis. No abdominopelvic adenopathy. Reproductive: Moderate prostatomegaly. Other: No significant free fluid. No evidence of omental or peritoneal disease. Musculoskeletal: Right iliac metastasis measures 4.3 by 2.5 cm on image 89/series 2, similar. More lateral iliac metastasis measures  maximally 2.1 cm on image 89/series 2 and is not significantly changed. IMPRESSION: CT CHEST IMPRESSION 1. Similar right upper lobe masslike consolidation, difficult to differentiate from presumed surrounding radiation change. 2. Suspicion of mild enlargement of a high right paratracheal node. Other thoracic nodes are similar in size and not pathologic by size criteria. 3. Similar right-sided pleural effusion with pleural thickening. 4.  Atherosclerosis, including within the coronary arteries. 5. Similar osseous metastasis. CT ABDOMEN AND PELVIS IMPRESSION 1. Similar osseous metastasis. No extraosseous metastatic disease within the abdomen or pelvis. 2. Cirrhosis. 3. Chronic gallbladder mucosal thickening, nonspecific. Similar moderate common duct dilatation, without cause identified. Electronically Signed: By: KAbigail MiyamotoM.D. On: 03/22/2016 09:53   Ct Abdomen Pelvis W Contrast  03/23/2016  ADDENDUM REPORT: 03/23/2016 12:10 ADDENDUM: The following should represent a non target lesion, rather than a second target lesion. Non target lesion # 8.: High right paratracheal node which measures 11 mm on image 11/series 2 versus 9 mm on the prior. Electronically Signed   By: KAbigail MiyamotoM.D.   On: 03/23/2016 12:10  03/23/2016  CLINICAL DATA:  Right-sided lung  cancer diagnosed 1/16. Restaging. Bone metastasis. Ongoing chemotherapy. EXAM: CT CHEST, ABDOMEN, AND PELVIS WITH CONTRAST TECHNIQUE: Multidetector CT imaging of the chest, abdomen and pelvis was performed following the standard protocol during bolus administration of intravenous contrast. CONTRAST:  162m ISOVUE-300 IOPAMIDOL (ISOVUE-300) INJECTION 61% COMPARISON:  01/26/2016 FINDINGS: RECIST 1.1 Target Lesions: 1. Right upper lobe lung mass, 6.4 x 4.1 cm on image 16/series 2. 6.5 x 4.2 cm on the prior. 2. High right paratracheal node which measures 11 mm on image 11/series 2 versus 9 mm on the prior. Non-target Lesions: 1. AP window node of 6 mm on image 21/  series 2 versus 9 mL and the prior. 2. Subcarinal node which measures 9 mm on image 26/series 2. unchanged. 3. Similar right iliac metastasis, measuring maximally 4.3 cm. 4. Right pleural effusion, similar. 5. Low right paratracheal node measuring 8 mm on image 21/series 2 , similar. 6. left lower lobe pulmonary nodularity, likely due to mucoid impaction, similar. 7. Right glenoid metastasis, similar. CT CHEST FINDINGS Mediastinum/Lymph Nodes: No supraclavicular adenopathy. Probable sebaceous cyst superficial to the left side of the hyoid cartilage. Example at 1.5 cm, similar. Aortic and branch vessel atherosclerosis. Normal heart size, without pericardial effusion. Multivessel coronary artery atherosclerosis. No central pulmonary embolism, on this non-dedicated study. Right paratracheal node measures 11 mm on image 11/series 2 versus 9 mm on the prior. Low right paratracheal node measures 8 mm on image 21/ series 2 and is unchanged. 9 mm subcarinal node is similar. Lungs/Pleura: Right-sided small volume pleural fluid with pleural thickening, similar. Diffuse right-sided septal thickening is similar. Dependent right lower lobe collapse/consolidative change is similar, including on image 59/series 4. Residual right upper lobe masslike consolidation measures on the order of 6.4 by 4.1 cm, including on image 16/series 2. Compare 6.5 x 4.2 cm on the prior. Musculoskeletal: Similar right glenoid metastasis, including on image 8/series 2 the. CT ABDOMEN PELVIS FINDINGS Hepatobiliary: Mild to moderate cirrhosis. Hyper enhancing gallbladder mucosa, as before. 1.3 cm common duct, similar. No obstructive stone or mass identified. Pancreas: Normal, without mass or ductal dilatation. Spleen: Normal in size, without focal abnormality. Adrenals/Urinary Tract: Normal adrenal glands. Too small to characterize left renal lesions. Normal right kidney. No hydronephrosis. Normal urinary bladder. Stomach/Bowel: Normal stomach, without  wall thickening. Normal colon, appendix, and terminal ileum. Normal small bowel. Vascular/Lymphatic: Aortic and branch vessel atherosclerosis. No abdominopelvic adenopathy. Reproductive: Moderate prostatomegaly. Other: No significant free fluid. No evidence of omental or peritoneal disease. Musculoskeletal: Right iliac metastasis measures 4.3 by 2.5 cm on image 89/series 2, similar. More lateral iliac metastasis measures maximally 2.1 cm on image 89/series 2 and is not significantly changed. IMPRESSION: CT CHEST IMPRESSION 1. Similar right upper lobe masslike consolidation, difficult to differentiate from presumed surrounding radiation change. 2. Suspicion of mild enlargement of a high right paratracheal node. Other thoracic nodes are similar in size and not pathologic by size criteria. 3. Similar right-sided pleural effusion with pleural thickening. 4.  Atherosclerosis, including within the coronary arteries. 5. Similar osseous metastasis. CT ABDOMEN AND PELVIS IMPRESSION 1. Similar osseous metastasis. No extraosseous metastatic disease within the abdomen or pelvis. 2. Cirrhosis. 3. Chronic gallbladder mucosal thickening, nonspecific. Similar moderate common duct dilatation, without cause identified. Electronically Signed: By: KAbigail MiyamotoM.D. On: 03/22/2016 09:53    ASSESSMENT AND PLAN: This is a very pleasant 81years old white male with history of stage IV non-small cell lung cancer, adenocarcinoma status post induction chemotherapy was carboplatin and Alimta and currently undergoing  treatment with immunotherapy according to the [employer] [place] 370 clinical trial. He was randomized to the Nivolumab arm 240 mg IV every 2 weeks. He is status post 20 cycles and tolerating his treatment fairly well.  Recent CT scan of the chest, abdomen and pelvis showed no evidence for disease progression. I discussed the scan results with the patient his wife. I recommended for him to continue his current treatment with  immunotherapy. The patient will proceed with cycle #21 of Nivolumab today as a scheduled. The patient would come back for follow-up visit in 2 weeks for reevaluation before starting cycle #21. The patient was advised to call immediately if he has any concerning symptoms in the interval. The patient voices understanding of current disease status and treatment options and is in agreement with the current care plan.  All questions were answered. The patient knows to call the clinic with any problems, questions or concerns. We can certainly see the patient much sooner if necessary.  Disclaimer: This note was dictated with voice recognition software. Similar sounding words can inadvertently be transcribed and may not be corrected upon review.

## 2016-03-29 NOTE — Progress Notes (Signed)
Mr. Omar Peters here for reassessment s/p XRT to his right ilium and right glenoid.  He reports level 2 or less pain in these areas. He reports less fatigue.  CT reports of chest, abdomen and pelvis completed on 03/22/16

## 2016-03-29 NOTE — Progress Notes (Signed)
Radiation Oncology         [phone] ________________________________  Name: Omar Peters MRN: [id number]  Date: 03/29/2016  DOB: February 05, 1935  Follow-Up Visit Note  CC: Tamsen Roers, MD  Curt Bears, MD  Diagnosis:  stage IV T3 N3 M1b non-small cell carcinoma of the right lung with metastatic disease to the right glenoid and right ilium.     ICD-9-CM ICD-10-CM   1. Cancer of upper lobe of right lung (HCC) 162.3 C34.11   2. Shortness of breath 786.05 R06.02   3. Bone metastasis (HCC) 198.5 C79.51     Interval Since Last Radiation:  4  weeks   02/14/2016-02/18/2016: 1. The right ilium metastasis was treated to 20 Gy in 4 fractions of 5 Gy 2. The right glenoid metastasis was treated to 20 Gy in 4 fractions of 5 Gy  Narrative:  The patient returns today for routine follow-up since completing his radiotherapy. He continues on clinical trial with Dr. Julien Nordmann and had a CT scan on 03/22/16 that revealed  essentially stable change of his target lesions, without criteria based progression.  On review of systems, the patient reports that he is doing extremely well, and his pain has completely resolved in his glenoid region, and has significantly also improved along the right hip. He reports that he only notices pain in his hip when he is bending his leg to put on his trousers. He denies any concerns with range of motion and is now able to, his hair without pain when doing so. He denies any new joint pains. He has been tolerating study drug well at this point, and is pleased with his progress. No other complaints or verbalized.   ALLERGIES:  is allergic to amitriptyline and marinol.  Meds: Current Outpatient Prescriptions  Medication Sig Dispense Refill  . amitriptyline (ELAVIL) 10 MG tablet Take 10 mg by mouth at bedtime.    Marland Kitchen amLODipine (NORVASC) 10 MG tablet Take 10 mg by mouth daily.    Marland Kitchen aspirin 81 MG tablet Take 81 mg by mouth daily.    . B Complex-Biotin-FA (B-COMPLEX PO)  Take 1 each by mouth daily. Reported on 03/01/2016    . metFORMIN (GLUCOPHAGE) 500 MG tablet Take 500 mg by mouth daily.    . ranitidine (ZANTAC) 150 MG tablet Take 150 mg by mouth daily.    Marland Kitchen senna-docusate (SENNA S) 8.6-50 MG per tablet Take 2 tablets by mouth daily.    Marland Kitchen HYDROcodone-acetaminophen (NORCO) 5-325 MG per tablet Take 1 tablet by mouth every 6 (six) hours as needed for moderate pain. (Patient not taking: Reported on 02/02/2016) 30 tablet 0  . Naproxen Sodium (ALEVE PO) Take 1 tablet by mouth daily as needed. Reported on 03/29/2016    . prochlorperazine (COMPAZINE) 10 MG tablet TAKE ONE TABLET BY MOUTH EVERY 6 HOURS AS NEEDED FOR NAUSEA AND VOMITING (Patient not taking: Reported on 02/02/2016) 30 tablet 0   No current facility-administered medications for this encounter.   Facility-Administered Medications Ordered in Other Encounters  Medication Dose Route Frequency Provider Last Rate Last Dose  . 0.9 %  sodium chloride infusion   Intravenous Once Curt Bears, MD      . INV-nivolumab BMS CA [id number] (OPDIVO) 240 mg in sodium chloride 0.9 % 100 mL (1.9355 mg/mL) chemo infusion  240 mg Intravenous Once Curt Bears, MD        Physical Findings:  height is '5\' 6"'$  (1.676 m) and weight is 145 lb 14.4 oz (66.18 kg). His temperature  is 98.3 F (36.8 C). His blood pressure is 145/67 and his pulse is 101. His oxygen saturation is 99%.   Pain scale 0/10 In general this is a well appearing Caucasian male in no acute distress. He's alert and oriented x4 and appropriate throughout the examination. Cardiopulmonary assessment is negative for acute distress and he exhibits normal effort. All range of motion is noted of his right upper extremity without any reproducible pain. There are no gait disturbances noted, and the patient has 5+5 strength of his right proximal lower extremity. No reproducible pain is appreciated.  Lab Findings: Lab Results  Component Value Date   WBC 6.9 03/29/2016    WBC 6.9 12/02/2015   HGB 15.1 03/29/2016   HGB 12.8* 12/02/2015   HCT 43.3 03/29/2016   HCT 38.0* 12/02/2015   PLT 193 03/29/2016   PLT 197 12/02/2015    Lab Results  Component Value Date   NA 140 03/29/2016   NA 140 12/02/2015   K 3.4* 03/29/2016   K 3.4* 12/02/2015   CHLORIDE 105 03/29/2016   CO2 27 03/29/2016   CO2 28 12/02/2015   GLUCOSE 144* 03/29/2016   GLUCOSE 120* 12/02/2015   BUN 9.6 03/29/2016   BUN 8 12/02/2015   CREATININE 0.8 03/29/2016   CREATININE 0.76 12/02/2015   BILITOT 0.37 03/29/2016   BILITOT 0.7 12/02/2015   ALKPHOS 100 03/29/2016   ALKPHOS 71 12/02/2015   AST 21 03/29/2016   AST 18 12/02/2015   ALT 18 03/29/2016   ALT 16* 12/02/2015   PROT 7.1 03/29/2016   PROT 6.3* 12/02/2015   ALBUMIN 3.0* 03/29/2016   ALBUMIN 3.0* 12/02/2015   CALCIUM 9.4 03/29/2016   CALCIUM 8.8* 12/02/2015   ANIONGAP 8 03/29/2016   ANIONGAP 7 12/02/2015    Radiographic Findings: Ct Chest W Contrast  03/23/2016  ADDENDUM REPORT: 03/23/2016 12:10 ADDENDUM: The following should represent a non target lesion, rather than a second target lesion. Non target lesion # 8.: High right paratracheal node which measures 11 mm on image 11/series 2 versus 9 mm on the prior. Electronically Signed   By: Abigail Miyamoto M.D.   On: 03/23/2016 12:10  03/23/2016  CLINICAL DATA:  Right-sided lung cancer diagnosed 1/16. Restaging. Bone metastasis. Ongoing chemotherapy. EXAM: CT CHEST, ABDOMEN, AND PELVIS WITH CONTRAST TECHNIQUE: Multidetector CT imaging of the chest, abdomen and pelvis was performed following the standard protocol during bolus administration of intravenous contrast. CONTRAST:  131m ISOVUE-300 IOPAMIDOL (ISOVUE-300) INJECTION 61% COMPARISON:  01/26/2016 FINDINGS: RECIST 1.1 Target Lesions: 1. Right upper lobe lung mass, 6.4 x 4.1 cm on image 16/series 2. 6.5 x 4.2 cm on the prior. 2. High right paratracheal node which measures 11 mm on image 11/series 2 versus 9 mm on the prior.  Non-target Lesions: 1. AP window node of 6 mm on image 21/ series 2 versus 9 mL and the prior. 2. Subcarinal node which measures 9 mm on image 26/series 2. unchanged. 3. Similar right iliac metastasis, measuring maximally 4.3 cm. 4. Right pleural effusion, similar. 5. Low right paratracheal node measuring 8 mm on image 21/series 2 , similar. 6. left lower lobe pulmonary nodularity, likely due to mucoid impaction, similar. 7. Right glenoid metastasis, similar. CT CHEST FINDINGS Mediastinum/Lymph Nodes: No supraclavicular adenopathy. Probable sebaceous cyst superficial to the left side of the hyoid cartilage. Example at 1.5 cm, similar. Aortic and branch vessel atherosclerosis. Normal heart size, without pericardial effusion. Multivessel coronary artery atherosclerosis. No central pulmonary embolism, on this non-dedicated study. Right paratracheal  node measures 11 mm on image 11/series 2 versus 9 mm on the prior. Low right paratracheal node measures 8 mm on image 21/ series 2 and is unchanged. 9 mm subcarinal node is similar. Lungs/Pleura: Right-sided small volume pleural fluid with pleural thickening, similar. Diffuse right-sided septal thickening is similar. Dependent right lower lobe collapse/consolidative change is similar, including on image 59/series 4. Residual right upper lobe masslike consolidation measures on the order of 6.4 by 4.1 cm, including on image 16/series 2. Compare 6.5 x 4.2 cm on the prior. Musculoskeletal: Similar right glenoid metastasis, including on image 8/series 2 the. CT ABDOMEN PELVIS FINDINGS Hepatobiliary: Mild to moderate cirrhosis. Hyper enhancing gallbladder mucosa, as before. 1.3 cm common duct, similar. No obstructive stone or mass identified. Pancreas: Normal, without mass or ductal dilatation. Spleen: Normal in size, without focal abnormality. Adrenals/Urinary Tract: Normal adrenal glands. Too small to characterize left renal lesions. Normal right kidney. No hydronephrosis.  Normal urinary bladder. Stomach/Bowel: Normal stomach, without wall thickening. Normal colon, appendix, and terminal ileum. Normal small bowel. Vascular/Lymphatic: Aortic and branch vessel atherosclerosis. No abdominopelvic adenopathy. Reproductive: Moderate prostatomegaly. Other: No significant free fluid. No evidence of omental or peritoneal disease. Musculoskeletal: Right iliac metastasis measures 4.3 by 2.5 cm on image 89/series 2, similar. More lateral iliac metastasis measures maximally 2.1 cm on image 89/series 2 and is not significantly changed. IMPRESSION: CT CHEST IMPRESSION 1. Similar right upper lobe masslike consolidation, difficult to differentiate from presumed surrounding radiation change. 2. Suspicion of mild enlargement of a high right paratracheal node. Other thoracic nodes are similar in size and not pathologic by size criteria. 3. Similar right-sided pleural effusion with pleural thickening. 4.  Atherosclerosis, including within the coronary arteries. 5. Similar osseous metastasis. CT ABDOMEN AND PELVIS IMPRESSION 1. Similar osseous metastasis. No extraosseous metastatic disease within the abdomen or pelvis. 2. Cirrhosis. 3. Chronic gallbladder mucosal thickening, nonspecific. Similar moderate common duct dilatation, without cause identified. Electronically Signed: By: Abigail Miyamoto M.D. On: 03/22/2016 09:53   Ct Abdomen Pelvis W Contrast  03/23/2016  ADDENDUM REPORT: 03/23/2016 12:10 ADDENDUM: The following should represent a non target lesion, rather than a second target lesion. Non target lesion # 8.: High right paratracheal node which measures 11 mm on image 11/series 2 versus 9 mm on the prior. Electronically Signed   By: Abigail Miyamoto M.D.   On: 03/23/2016 12:10  03/23/2016  CLINICAL DATA:  Right-sided lung cancer diagnosed 1/16. Restaging. Bone metastasis. Ongoing chemotherapy. EXAM: CT CHEST, ABDOMEN, AND PELVIS WITH CONTRAST TECHNIQUE: Multidetector CT imaging of the chest, abdomen and  pelvis was performed following the standard protocol during bolus administration of intravenous contrast. CONTRAST:  164m ISOVUE-300 IOPAMIDOL (ISOVUE-300) INJECTION 61% COMPARISON:  01/26/2016 FINDINGS: RECIST 1.1 Target Lesions: 1. Right upper lobe lung mass, 6.4 x 4.1 cm on image 16/series 2. 6.5 x 4.2 cm on the prior. 2. High right paratracheal node which measures 11 mm on image 11/series 2 versus 9 mm on the prior. Non-target Lesions: 1. AP window node of 6 mm on image 21/ series 2 versus 9 mL and the prior. 2. Subcarinal node which measures 9 mm on image 26/series 2. unchanged. 3. Similar right iliac metastasis, measuring maximally 4.3 cm. 4. Right pleural effusion, similar. 5. Low right paratracheal node measuring 8 mm on image 21/series 2 , similar. 6. left lower lobe pulmonary nodularity, likely due to mucoid impaction, similar. 7. Right glenoid metastasis, similar. CT CHEST FINDINGS Mediastinum/Lymph Nodes: No supraclavicular adenopathy. Probable sebaceous cyst superficial to  the left side of the hyoid cartilage. Example at 1.5 cm, similar. Aortic and branch vessel atherosclerosis. Normal heart size, without pericardial effusion. Multivessel coronary artery atherosclerosis. No central pulmonary embolism, on this non-dedicated study. Right paratracheal node measures 11 mm on image 11/series 2 versus 9 mm on the prior. Low right paratracheal node measures 8 mm on image 21/ series 2 and is unchanged. 9 mm subcarinal node is similar. Lungs/Pleura: Right-sided small volume pleural fluid with pleural thickening, similar. Diffuse right-sided septal thickening is similar. Dependent right lower lobe collapse/consolidative change is similar, including on image 59/series 4. Residual right upper lobe masslike consolidation measures on the order of 6.4 by 4.1 cm, including on image 16/series 2. Compare 6.5 x 4.2 cm on the prior. Musculoskeletal: Similar right glenoid metastasis, including on image 8/series 2 the. CT  ABDOMEN PELVIS FINDINGS Hepatobiliary: Mild to moderate cirrhosis. Hyper enhancing gallbladder mucosa, as before. 1.3 cm common duct, similar. No obstructive stone or mass identified. Pancreas: Normal, without mass or ductal dilatation. Spleen: Normal in size, without focal abnormality. Adrenals/Urinary Tract: Normal adrenal glands. Too small to characterize left renal lesions. Normal right kidney. No hydronephrosis. Normal urinary bladder. Stomach/Bowel: Normal stomach, without wall thickening. Normal colon, appendix, and terminal ileum. Normal small bowel. Vascular/Lymphatic: Aortic and branch vessel atherosclerosis. No abdominopelvic adenopathy. Reproductive: Moderate prostatomegaly. Other: No significant free fluid. No evidence of omental or peritoneal disease. Musculoskeletal: Right iliac metastasis measures 4.3 by 2.5 cm on image 89/series 2, similar. More lateral iliac metastasis measures maximally 2.1 cm on image 89/series 2 and is not significantly changed. IMPRESSION: CT CHEST IMPRESSION 1. Similar right upper lobe masslike consolidation, difficult to differentiate from presumed surrounding radiation change. 2. Suspicion of mild enlargement of a high right paratracheal node. Other thoracic nodes are similar in size and not pathologic by size criteria. 3. Similar right-sided pleural effusion with pleural thickening. 4.  Atherosclerosis, including within the coronary arteries. 5. Similar osseous metastasis. CT ABDOMEN AND PELVIS IMPRESSION 1. Similar osseous metastasis. No extraosseous metastatic disease within the abdomen or pelvis. 2. Cirrhosis. 3. Chronic gallbladder mucosal thickening, nonspecific. Similar moderate common duct dilatation, without cause identified. Electronically Signed: By: Abigail Miyamoto M.D. On: 03/22/2016 09:53    Impression/Plan: 1. Stage IV T3 N3 M1b non-small cell carcinoma of the right lung with metastatic disease to the right glenoid and right iliumd and right ilium. The  patient continues to do well in his pain has improved since completing radiotherapy. We will be happy to see the patient in the future if there are any additional means for radiotherapy. He will continue under the care of Dr. Julien Nordmann and the research department here at [facility]. We wished him the best moving forward, and have encouraged him to call if he has any questions or concerns regarding his previous treatment with [place].     Carola Rhine, PAC

## 2016-04-05 ENCOUNTER — Ambulatory Visit: Payer: Medicare Other | Admitting: Internal Medicine

## 2016-04-05 ENCOUNTER — Other Ambulatory Visit: Payer: Medicare Other

## 2016-04-07 ENCOUNTER — Other Ambulatory Visit: Payer: Self-pay | Admitting: Medical Oncology

## 2016-04-07 DIAGNOSIS — Z79899 Other long term (current) drug therapy: Secondary | ICD-10-CM

## 2016-04-07 DIAGNOSIS — C3411 Malignant neoplasm of upper lobe, right bronchus or lung: Secondary | ICD-10-CM

## 2016-04-11 ENCOUNTER — Encounter: Payer: Self-pay | Admitting: Internal Medicine

## 2016-04-11 ENCOUNTER — Encounter: Payer: Self-pay | Admitting: *Deleted

## 2016-04-11 ENCOUNTER — Ambulatory Visit (HOSPITAL_BASED_OUTPATIENT_CLINIC_OR_DEPARTMENT_OTHER): Payer: Medicare Other | Admitting: Internal Medicine

## 2016-04-11 ENCOUNTER — Ambulatory Visit (HOSPITAL_BASED_OUTPATIENT_CLINIC_OR_DEPARTMENT_OTHER): Payer: Medicare Other

## 2016-04-11 ENCOUNTER — Other Ambulatory Visit (HOSPITAL_COMMUNITY)
Admission: RE | Admit: 2016-04-11 | Discharge: 2016-04-11 | Disposition: A | Discharge status: Home / Self Care | Payer: Medicare Other | Source: Ambulatory Visit | Attending: Internal Medicine | Admitting: Internal Medicine

## 2016-04-11 ENCOUNTER — Encounter: Payer: Self-pay | Admitting: Oncology

## 2016-04-11 ENCOUNTER — Other Ambulatory Visit (HOSPITAL_BASED_OUTPATIENT_CLINIC_OR_DEPARTMENT_OTHER): Payer: Medicare Other

## 2016-04-11 VITALS — BP 140/72 | HR 98 | Temp 97.8°F | Resp 17 | Ht 66.0 in | Wt 142.0 lb

## 2016-04-11 DIAGNOSIS — Z006 Encounter for examination for normal comparison and control in clinical research program: Secondary | ICD-10-CM

## 2016-04-11 DIAGNOSIS — C3411 Malignant neoplasm of upper lobe, right bronchus or lung: Secondary | ICD-10-CM | POA: Diagnosis present

## 2016-04-11 DIAGNOSIS — Z79899 Other long term (current) drug therapy: Secondary | ICD-10-CM

## 2016-04-11 DIAGNOSIS — C7951 Secondary malignant neoplasm of bone: Secondary | ICD-10-CM

## 2016-04-11 DIAGNOSIS — Z5112 Encounter for antineoplastic immunotherapy: Secondary | ICD-10-CM

## 2016-04-11 LAB — MAGNESIUM: Magnesium: 1.9 mg/dl (ref 1.5–2.5)

## 2016-04-11 LAB — CBC WITH DIFFERENTIAL/PLATELET
BASO%: 0.1 % (ref 0.0–2.0)
Basophils Absolute: 0 10*3/uL (ref 0.0–0.1)
EOS ABS: 0.3 10*3/uL (ref 0.0–0.5)
EOS%: 4.9 % (ref 0.0–7.0)
HCT: 45.6 % (ref 38.4–49.9)
HEMOGLOBIN: 15.3 g/dL (ref 13.0–17.1)
LYMPH%: 24.7 % (ref 14.0–49.0)
MCH: 33.6 pg — ABNORMAL HIGH (ref 27.2–33.4)
MCHC: 33.7 g/dL (ref 32.0–36.0)
MCV: 99.7 fL — AB (ref 79.3–98.0)
MONO#: 0.7 10*3/uL (ref 0.1–0.9)
MONO%: 11 % (ref 0.0–14.0)
NEUT%: 59.3 % (ref 39.0–75.0)
NEUTROS ABS: 4 10*3/uL (ref 1.5–6.5)
Platelets: 280 10*3/uL (ref 140–400)
RBC: 4.57 10*6/uL (ref 4.20–5.82)
RDW: 13.9 % (ref 11.0–14.6)
WBC: 6.8 10*3/uL (ref 4.0–10.3)
lymph#: 1.7 10*3/uL (ref 0.9–3.3)

## 2016-04-11 LAB — LACTATE DEHYDROGENASE: LDH: 119 U/L — ABNORMAL LOW (ref 125–245)

## 2016-04-11 LAB — COMPREHENSIVE METABOLIC PANEL
ALBUMIN: 3.1 g/dL — AB (ref 3.5–5.0)
ALT: 18 U/L (ref 0–55)
AST: 18 U/L (ref 5–34)
Alkaline Phosphatase: 96 U/L (ref 40–150)
Anion Gap: 10 mEq/L (ref 3–11)
BUN: 8.9 mg/dL (ref 7.0–26.0)
CHLORIDE: 102 meq/L (ref 98–109)
CO2: 27 meq/L (ref 22–29)
CREATININE: 0.8 mg/dL (ref 0.7–1.3)
Calcium: 9.4 mg/dL (ref 8.4–10.4)
EGFR: 84 mL/min/{1.73_m2} — ABNORMAL LOW (ref >90)
GLUCOSE: 170 mg/dL — AB (ref 70–140)
Potassium: 3.6 mEq/L (ref 3.5–5.1)
SODIUM: 138 meq/L (ref 136–145)
TOTAL PROTEIN: 7 g/dL (ref 6.4–8.3)
Total Bilirubin: 0.47 mg/dL (ref 0.20–1.20)

## 2016-04-11 LAB — PHOSPHORUS: Phosphorus: 3.2 mg/dL (ref 2.5–4.6)

## 2016-04-11 LAB — TSH: TSH: 2.977 m[IU]/L (ref 0.320–4.118)

## 2016-04-11 MED ORDER — SODIUM CHLORIDE 0.9 % IV SOLN
Freq: Once | INTRAVENOUS | Status: AC
  Administered 2016-04-11: 12:00:00 via INTRAVENOUS

## 2016-04-11 MED ORDER — SODIUM CHLORIDE 0.9 % IV SOLN
240.0000 mg | Freq: Once | INTRAVENOUS | Status: AC
  Administered 2016-04-11: 240 mg via INTRAVENOUS
  Filled 2016-04-11: qty 24

## 2016-04-11 NOTE — Progress Notes (Signed)
[facility] [phone]   Fax:([phone]) [place], [place] [address] [place] [address]  DIAGNOSIS: Stage IV (T3, N3, M1b) non-small cell lung cancer, adenocarcinoma diagnosed in January 2016 presented with right upper lobe lung mass in addition to mediastinal lymphadenopathy and large right pleural effusion as well as bone metastasis.  PRIOR THERAPY:  1. Status post Pleurx catheter placement by Dr. Servando Snare for drainage of the right pleural effusion.  2. Systemic chemotherapy with carboplatin for AUC of 5 and Alimta 500 MG/M2 every 3 weeks. Status post 6 cycles. 3. Status post palliative radiotherapy to the metastatic bone lesion in the right glenoid as well as the right ilium area completed 02/18/2016  CURRENT THERAPY: Patient is participating in the [place] 370 clinical trial and has been randomized to group A arm B-3 to receive Nivolumab 240 mg given every 2 weeks. First cycle expected 06/10/2015. Status post 21 cycles.  INTERVAL HISTORY: Omar Peters 81 y.o. male returns to the clinic today for follow-up visit accompanied by his wife. The patient is feeling much better today with no specific complaints. He tolerated the last cycle of his treatment with Nivolumab fairly well with no significant adverse effects. He denied having any significant fever or chills. He has no nausea, vomiting, diarrhea or constipation. The patient denied having any significant skin rash. He denied having any significant weight loss or night sweats. He has no chest pain, shortness of breath, cough or hemoptysis. He is here today to start cycle #22.  MEDICAL HISTORY: Past Medical History  Diagnosis Date  . Hypertension   . GERD (gastroesophageal reflux disease)   . Shortness of breath dyspnea     01/14/15 at night and has to sit up  . Anxiety     due to diagnosis of Stage 4 Lung Cancer  . Macular degeneration of right eye   .  Diabetes mellitus without complication ([place])   . Lung cancer ([place])     stage IV non small cell lung cancer dx. January 2016   . Bone cancer ([place])     non small cell lung with mets to bone    ALLERGIES:  is allergic to amitriptyline and marinol.  MEDICATIONS:  Current Outpatient Prescriptions  Medication Sig Dispense Refill  . amitriptyline (ELAVIL) 10 MG tablet Take 10 mg by mouth at bedtime.    Marland Kitchen amLODipine (NORVASC) 10 MG tablet Take 10 mg by mouth daily.    Marland Kitchen aspirin 81 MG tablet Take 81 mg by mouth daily.    . B Complex-Biotin-FA (B-COMPLEX PO) Take 1 each by mouth daily. Reported on 03/01/2016    . HYDROcodone-acetaminophen (NORCO) 5-325 MG per tablet Take 1 tablet by mouth every 6 (six) hours as needed for moderate pain. (Patient not taking: Reported on 02/02/2016) 30 tablet 0  . metFORMIN (GLUCOPHAGE) 500 MG tablet Take 500 mg by mouth daily.    . Naproxen Sodium (ALEVE PO) Take 1 tablet by mouth daily as needed. Reported on 03/29/2016    . prochlorperazine (COMPAZINE) 10 MG tablet TAKE ONE TABLET BY MOUTH EVERY 6 HOURS AS NEEDED FOR NAUSEA AND VOMITING (Patient not taking: Reported on 02/02/2016) 30 tablet 0  . ranitidine (ZANTAC) 150 MG tablet Take 150 mg by mouth daily.    Marland Kitchen senna-docusate (SENNA S) 8.6-50 MG per tablet Take 2 tablets by mouth daily.     No current facility-administered medications for this visit.  SURGICAL HISTORY:  Past Surgical History  Procedure Laterality Date  . Vein ligation and stripping  1977  . Chest tube insertion Right 01/15/2015    Procedure: INSERTION PLEURAL DRAINAGE CATHETER WITH ULTRASOUND AND FLURO GUIDANCE;  Surgeon: Grace Isaac, MD;  Location: [facility];  Service: Thoracic;  Laterality: Right;  . Removal of pleural drainage catheter Right 03/26/2015    Procedure: REMOVAL OF PLEURAL DRAINAGE CATHETER;  Surgeon: Grace Isaac, MD;  Location: [facility];  Service: Thoracic;  Laterality: Right;    REVIEW OF SYSTEMS:  A comprehensive review of  systems was negative.   PHYSICAL EXAMINATION: General appearance: alert, cooperative and no distress Head: Normocephalic, without obvious abnormality, atraumatic Neck: no adenopathy, no JVD, supple, symmetrical, trachea midline and thyroid not enlarged, symmetric, no tenderness/mass/nodules Lymph nodes: Cervical, supraclavicular, and axillary nodes normal. Resp: clear to auscultation bilaterally Back: symmetric, no curvature. ROM normal. No CVA tenderness. Cardio: regular rate and rhythm, S1, S2 normal, no murmur, click, rub or gallop GI: soft, non-tender; bowel sounds normal; no masses,  no organomegaly Extremities: extremities normal, atraumatic, no cyanosis or edema Neurologic: Alert and oriented X 3, normal strength and tone. Normal symmetric reflexes. Normal coordination and gait  ECOG PERFORMANCE STATUS: 1 - Symptomatic but completely ambulatory  Blood pressure 140/72, pulse 98, temperature 97.8 F (36.6 C), temperature source Oral, resp. rate 17, height '5\' 6"'$  (1.676 m), weight 142 lb (64.411 kg), SpO2 97 %.  LABORATORY DATA: Lab Results  Component Value Date   WBC 6.8 04/11/2016   HGB 15.3 04/11/2016   HCT 45.6 04/11/2016   MCV 99.7* 04/11/2016   PLT 280 04/11/2016      Chemistry      Component Value Date/Time   NA 138 04/11/2016 1032   NA 140 12/02/2015 0400   K 3.6 04/11/2016 1032   K 3.4* 12/02/2015 0400   CL 105 12/02/2015 0400   CO2 27 04/11/2016 1032   CO2 28 12/02/2015 0400   BUN 8.9 04/11/2016 1032   BUN 8 12/02/2015 0400   CREATININE 0.8 04/11/2016 1032   CREATININE 0.76 12/02/2015 0400   GLU 231* 03/10/2015 1539      Component Value Date/Time   CALCIUM 9.4 04/11/2016 1032   CALCIUM 8.8* 12/02/2015 0400   ALKPHOS 96 04/11/2016 1032   ALKPHOS 71 12/02/2015 0400   AST 18 04/11/2016 1032   AST 18 12/02/2015 0400   ALT 18 04/11/2016 1032   ALT 16* 12/02/2015 0400   BILITOT 0.47 04/11/2016 1032   BILITOT 0.7 12/02/2015 0400       RADIOGRAPHIC  STUDIES: Ct Chest W Contrast  03/23/2016  ADDENDUM REPORT: 03/23/2016 12:10 ADDENDUM: The following should represent a non target lesion, rather than a second target lesion. Non target lesion # 8.: High right paratracheal node which measures 11 mm on image 11/series 2 versus 9 mm on the prior. Electronically Signed   By: Abigail Miyamoto M.D.   On: 03/23/2016 12:10  03/23/2016  CLINICAL DATA:  Right-sided lung cancer diagnosed 1/16. Restaging. Bone metastasis. Ongoing chemotherapy. EXAM: CT CHEST, ABDOMEN, AND PELVIS WITH CONTRAST TECHNIQUE: Multidetector CT imaging of the chest, abdomen and pelvis was performed following the standard protocol during bolus administration of intravenous contrast. CONTRAST:  136m ISOVUE-300 IOPAMIDOL (ISOVUE-300) INJECTION 61% COMPARISON:  01/26/2016 FINDINGS: RECIST 1.1 Target Lesions: 1. Right upper lobe lung mass, 6.4 x 4.1 cm on image 16/series 2. 6.5 x 4.2 cm on the prior. 2. High right paratracheal node which measures 11 mm  on image 11/series 2 versus 9 mm on the prior. Non-target Lesions: 1. AP window node of 6 mm on image 21/ series 2 versus 9 mL and the prior. 2. Subcarinal node which measures 9 mm on image 26/series 2. unchanged. 3. Similar right iliac metastasis, measuring maximally 4.3 cm. 4. Right pleural effusion, similar. 5. Low right paratracheal node measuring 8 mm on image 21/series 2 , similar. 6. left lower lobe pulmonary nodularity, likely due to mucoid impaction, similar. 7. Right glenoid metastasis, similar. CT CHEST FINDINGS Mediastinum/Lymph Nodes: No supraclavicular adenopathy. Probable sebaceous cyst superficial to the left side of the hyoid cartilage. Example at 1.5 cm, similar. Aortic and branch vessel atherosclerosis. Normal heart size, without pericardial effusion. Multivessel coronary artery atherosclerosis. No central pulmonary embolism, on this non-dedicated study. Right paratracheal node measures 11 mm on image 11/series 2 versus 9 mm on the prior. Low  right paratracheal node measures 8 mm on image 21/ series 2 and is unchanged. 9 mm subcarinal node is similar. Lungs/Pleura: Right-sided small volume pleural fluid with pleural thickening, similar. Diffuse right-sided septal thickening is similar. Dependent right lower lobe collapse/consolidative change is similar, including on image 59/series 4. Residual right upper lobe masslike consolidation measures on the order of 6.4 by 4.1 cm, including on image 16/series 2. Compare 6.5 x 4.2 cm on the prior. Musculoskeletal: Similar right glenoid metastasis, including on image 8/series 2 the. CT ABDOMEN PELVIS FINDINGS Hepatobiliary: Mild to moderate cirrhosis. Hyper enhancing gallbladder mucosa, as before. 1.3 cm common duct, similar. No obstructive stone or mass identified. Pancreas: Normal, without mass or ductal dilatation. Spleen: Normal in size, without focal abnormality. Adrenals/Urinary Tract: Normal adrenal glands. Too small to characterize left renal lesions. Normal right kidney. No hydronephrosis. Normal urinary bladder. Stomach/Bowel: Normal stomach, without wall thickening. Normal colon, appendix, and terminal ileum. Normal small bowel. Vascular/Lymphatic: Aortic and branch vessel atherosclerosis. No abdominopelvic adenopathy. Reproductive: Moderate prostatomegaly. Other: No significant free fluid. No evidence of omental or peritoneal disease. Musculoskeletal: Right iliac metastasis measures 4.3 by 2.5 cm on image 89/series 2, similar. More lateral iliac metastasis measures maximally 2.1 cm on image 89/series 2 and is not significantly changed. IMPRESSION: CT CHEST IMPRESSION 1. Similar right upper lobe masslike consolidation, difficult to differentiate from presumed surrounding radiation change. 2. Suspicion of mild enlargement of a high right paratracheal node. Other thoracic nodes are similar in size and not pathologic by size criteria. 3. Similar right-sided pleural effusion with pleural thickening. 4.   Atherosclerosis, including within the coronary arteries. 5. Similar osseous metastasis. CT ABDOMEN AND PELVIS IMPRESSION 1. Similar osseous metastasis. No extraosseous metastatic disease within the abdomen or pelvis. 2. Cirrhosis. 3. Chronic gallbladder mucosal thickening, nonspecific. Similar moderate common duct dilatation, without cause identified. Electronically Signed: By: Abigail Miyamoto M.D. On: 03/22/2016 09:53   Ct Abdomen Pelvis W Contrast  03/23/2016  ADDENDUM REPORT: 03/23/2016 12:10 ADDENDUM: The following should represent a non target lesion, rather than a second target lesion. Non target lesion # 8.: High right paratracheal node which measures 11 mm on image 11/series 2 versus 9 mm on the prior. Electronically Signed   By: Abigail Miyamoto M.D.   On: 03/23/2016 12:10  03/23/2016  CLINICAL DATA:  Right-sided lung cancer diagnosed 1/16. Restaging. Bone metastasis. Ongoing chemotherapy. EXAM: CT CHEST, ABDOMEN, AND PELVIS WITH CONTRAST TECHNIQUE: Multidetector CT imaging of the chest, abdomen and pelvis was performed following the standard protocol during bolus administration of intravenous contrast. CONTRAST:  127m ISOVUE-300 IOPAMIDOL (ISOVUE-300) INJECTION 61% COMPARISON:  01/26/2016 FINDINGS: RECIST 1.1 Target Lesions: 1. Right upper lobe lung mass, 6.4 x 4.1 cm on image 16/series 2. 6.5 x 4.2 cm on the prior. 2. High right paratracheal node which measures 11 mm on image 11/series 2 versus 9 mm on the prior. Non-target Lesions: 1. AP window node of 6 mm on image 21/ series 2 versus 9 mL and the prior. 2. Subcarinal node which measures 9 mm on image 26/series 2. unchanged. 3. Similar right iliac metastasis, measuring maximally 4.3 cm. 4. Right pleural effusion, similar. 5. Low right paratracheal node measuring 8 mm on image 21/series 2 , similar. 6. left lower lobe pulmonary nodularity, likely due to mucoid impaction, similar. 7. Right glenoid metastasis, similar. CT CHEST FINDINGS Mediastinum/Lymph  Nodes: No supraclavicular adenopathy. Probable sebaceous cyst superficial to the left side of the hyoid cartilage. Example at 1.5 cm, similar. Aortic and branch vessel atherosclerosis. Normal heart size, without pericardial effusion. Multivessel coronary artery atherosclerosis. No central pulmonary embolism, on this non-dedicated study. Right paratracheal node measures 11 mm on image 11/series 2 versus 9 mm on the prior. Low right paratracheal node measures 8 mm on image 21/ series 2 and is unchanged. 9 mm subcarinal node is similar. Lungs/Pleura: Right-sided small volume pleural fluid with pleural thickening, similar. Diffuse right-sided septal thickening is similar. Dependent right lower lobe collapse/consolidative change is similar, including on image 59/series 4. Residual right upper lobe masslike consolidation measures on the order of 6.4 by 4.1 cm, including on image 16/series 2. Compare 6.5 x 4.2 cm on the prior. Musculoskeletal: Similar right glenoid metastasis, including on image 8/series 2 the. CT ABDOMEN PELVIS FINDINGS Hepatobiliary: Mild to moderate cirrhosis. Hyper enhancing gallbladder mucosa, as before. 1.3 cm common duct, similar. No obstructive stone or mass identified. Pancreas: Normal, without mass or ductal dilatation. Spleen: Normal in size, without focal abnormality. Adrenals/Urinary Tract: Normal adrenal glands. Too small to characterize left renal lesions. Normal right kidney. No hydronephrosis. Normal urinary bladder. Stomach/Bowel: Normal stomach, without wall thickening. Normal colon, appendix, and terminal ileum. Normal small bowel. Vascular/Lymphatic: Aortic and branch vessel atherosclerosis. No abdominopelvic adenopathy. Reproductive: Moderate prostatomegaly. Other: No significant free fluid. No evidence of omental or peritoneal disease. Musculoskeletal: Right iliac metastasis measures 4.3 by 2.5 cm on image 89/series 2, similar. More lateral iliac metastasis measures maximally 2.1 cm  on image 89/series 2 and is not significantly changed. IMPRESSION: CT CHEST IMPRESSION 1. Similar right upper lobe masslike consolidation, difficult to differentiate from presumed surrounding radiation change. 2. Suspicion of mild enlargement of a high right paratracheal node. Other thoracic nodes are similar in size and not pathologic by size criteria. 3. Similar right-sided pleural effusion with pleural thickening. 4.  Atherosclerosis, including within the coronary arteries. 5. Similar osseous metastasis. CT ABDOMEN AND PELVIS IMPRESSION 1. Similar osseous metastasis. No extraosseous metastatic disease within the abdomen or pelvis. 2. Cirrhosis. 3. Chronic gallbladder mucosal thickening, nonspecific. Similar moderate common duct dilatation, without cause identified. Electronically Signed: By: Abigail Miyamoto M.D. On: 03/22/2016 09:53    ASSESSMENT AND PLAN: This is a very pleasant 81 years old white male with history of stage IV non-small cell lung cancer, adenocarcinoma status post induction chemotherapy was carboplatin and Alimta and currently undergoing treatment with immunotherapy according to the [facility] [place] 370 clinical trial. He was randomized to the Nivolumab arm 240 mg IV every 2 weeks. He is status post 20 cycles and tolerating his treatment fairly well.  I recommended for the patient to continue his treatment with Nivolumab  as scheduled The patient will proceed with cycle #22 today. The patient would come back for follow-up visit in 2 weeks for reevaluation before starting cycle #23. The patient was advised to call immediately if he has any concerning symptoms in the interval. The patient voices understanding of current disease status and treatment options and is in agreement with the current care plan.  All questions were answered. The patient knows to call the clinic with any problems, questions or concerns. We can certainly see the patient much sooner if necessary.  Disclaimer: This note  was dictated with voice recognition software. Similar sounding words can inadvertently be transcribed and may not be corrected upon review.

## 2016-04-11 NOTE — Patient Instructions (Signed)
[facility] Discharge Instructions for Patients Receiving Chemotherapy  Today you received the following chemotherapy agents Nivolumab  To help prevent nausea and vomiting after your treatment, we encourage you to take your nausea medication     If you develop nausea and vomiting that is not controlled by your nausea medication, call the clinic.   BELOW ARE SYMPTOMS THAT SHOULD BE REPORTED IMMEDIATELY:  *FEVER GREATER THAN 100.5 F  *CHILLS WITH OR WITHOUT FEVER  NAUSEA AND VOMITING THAT IS NOT CONTROLLED WITH YOUR NAUSEA MEDICATION  *UNUSUAL SHORTNESS OF BREATH  *UNUSUAL BRUISING OR BLEEDING  TENDERNESS IN MOUTH AND THROAT WITH OR WITHOUT PRESENCE OF ULCERS  *URINARY PROBLEMS  *BOWEL PROBLEMS  UNUSUAL RASH Items with * indicate a potential emergency and should be followed up as soon as possible.  Feel free to call the clinic you have any questions or concerns. The clinic phone number is [phone].  Please show the CHEMO ALERT CARD at check-in to the Emergency Department and triage nurse.   

## 2016-04-11 NOTE — Progress Notes (Signed)
04/11/16 at 11:31am - [facility] 370- Group A, Arm B3: cycle 22, day 1 study notes- The pt was into the cancer center this morning with his wife for his cycle 22 assessments and nivolumab treatment.  The pt completed his study questionnaires/PRO's.  The pt's study required labs were drawn today.  The pt was seen and examined today by Dr. Julien Nordmann.  Dr. Julien Nordmann reviewed the pt's blood results, and he said that the pt's values were acceptable to proceed with treatment today.  Dr. Julien Nordmann stated that his abnormal lab values were "not clinically significant".  The pt's TSH was WNL.  Therefore, no additional thyroid tests were required.  The pt denied any new medicines or any changes to his current medications.  The pt said that he tolerated his last treatment well with no problems.  The pt denies any fevers or pain issues.  He also denies any swelling, skin rashes, headaches, new eye problems, or pulmonary changes.  The pt said that his radiation really helped his shoulder pain.  The pt denies any falls.  The pt also reports sleeping well at night.  However, he does report that his appetite is decreased.  He states that he is not drinking as many "milkshakes" recently.  The pt has had a 3 lb weight loss since his last visit.  Dr. Julien Nordmann encouraged the pt to eat more, and Dr. Julien Nordmann said that a "couple of lost pounds" is not significant.  The pt and his wife are aware of his future May appointments.  Study assigned vial assignment numbers provided to [name], pharmacist.  The research nurse spoke to [facility], infusion room nurse, about the pt's treatment.  The infusion nurse was given the administration sheet for her reference.   Brion Aliment RN, BSN, CCRP  Clinical Research Nurse 04/11/2016 1:02 PM

## 2016-04-11 NOTE — Progress Notes (Signed)
04/11/16 - BMS [id number] - Questionnaires (PRO's) - Patient into cancer center for routine visit.  Patient given PRO's upon arrival to the cancer center.  Patient completed the PRO's ([id number] first and then the FACT-L).  The PRO's were checked for completeness.  The patient was thanked for his continued support in this clinical trial. Remer Macho 04/11/16 - 10:50 am

## 2016-04-14 ENCOUNTER — Encounter: Payer: Self-pay | Admitting: Medical Oncology

## 2016-04-14 DIAGNOSIS — C3411 Malignant neoplasm of upper lobe, right bronchus or lung: Secondary | ICD-10-CM

## 2016-04-25 ENCOUNTER — Other Ambulatory Visit: Payer: Self-pay | Admitting: Medical Oncology

## 2016-04-25 DIAGNOSIS — C3411 Malignant neoplasm of upper lobe, right bronchus or lung: Secondary | ICD-10-CM

## 2016-04-26 ENCOUNTER — Other Ambulatory Visit (HOSPITAL_COMMUNITY)
Admission: RE | Admit: 2016-04-26 | Discharge: 2016-04-26 | Disposition: A | Discharge status: Home / Self Care | Payer: Medicare Other | Source: Ambulatory Visit | Attending: Internal Medicine | Admitting: Internal Medicine

## 2016-04-26 ENCOUNTER — Telehealth: Payer: Self-pay | Admitting: Internal Medicine

## 2016-04-26 ENCOUNTER — Encounter: Payer: Self-pay | Admitting: Medical Oncology

## 2016-04-26 ENCOUNTER — Other Ambulatory Visit (HOSPITAL_BASED_OUTPATIENT_CLINIC_OR_DEPARTMENT_OTHER): Payer: Medicare Other

## 2016-04-26 ENCOUNTER — Encounter: Payer: Self-pay | Admitting: Internal Medicine

## 2016-04-26 ENCOUNTER — Ambulatory Visit (HOSPITAL_BASED_OUTPATIENT_CLINIC_OR_DEPARTMENT_OTHER): Payer: Medicare Other

## 2016-04-26 ENCOUNTER — Ambulatory Visit (HOSPITAL_BASED_OUTPATIENT_CLINIC_OR_DEPARTMENT_OTHER): Payer: Medicare Other | Admitting: Internal Medicine

## 2016-04-26 VITALS — BP 140/61 | HR 101 | Temp 98.1°F | Resp 18 | Ht 66.0 in | Wt 142.6 lb

## 2016-04-26 DIAGNOSIS — Z5112 Encounter for antineoplastic immunotherapy: Secondary | ICD-10-CM

## 2016-04-26 DIAGNOSIS — C3411 Malignant neoplasm of upper lobe, right bronchus or lung: Secondary | ICD-10-CM

## 2016-04-26 DIAGNOSIS — C7951 Secondary malignant neoplasm of bone: Secondary | ICD-10-CM | POA: Diagnosis not present

## 2016-04-26 DIAGNOSIS — Z006 Encounter for examination for normal comparison and control in clinical research program: Secondary | ICD-10-CM

## 2016-04-26 LAB — COMPREHENSIVE METABOLIC PANEL
ALBUMIN: 3.1 g/dL — AB (ref 3.5–5.0)
ALK PHOS: 106 U/L (ref 40–150)
ALT: 21 U/L (ref 0–55)
ANION GAP: 7 meq/L (ref 3–11)
AST: 20 U/L (ref 5–34)
BUN: 8.9 mg/dL (ref 7.0–26.0)
CALCIUM: 9.5 mg/dL (ref 8.4–10.4)
CO2: 30 mEq/L — ABNORMAL HIGH (ref 22–29)
Chloride: 101 mEq/L (ref 98–109)
Creatinine: 0.9 mg/dL (ref 0.7–1.3)
EGFR: 81 mL/min/{1.73_m2} — AB (ref >90)
Glucose: 183 mg/dl — ABNORMAL HIGH (ref 70–140)
POTASSIUM: 3.7 meq/L (ref 3.5–5.1)
Sodium: 138 mEq/L (ref 136–145)
Total Bilirubin: 0.54 mg/dL (ref 0.20–1.20)
Total Protein: 7.2 g/dL (ref 6.4–8.3)

## 2016-04-26 LAB — CBC WITH DIFFERENTIAL/PLATELET
BASO%: 0.1 % (ref 0.0–2.0)
BASOS ABS: 0 10*3/uL (ref 0.0–0.1)
EOS ABS: 0.4 10*3/uL (ref 0.0–0.5)
EOS%: 6.1 % (ref 0.0–7.0)
HEMATOCRIT: 47.3 % (ref 38.4–49.9)
HEMOGLOBIN: 15.7 g/dL (ref 13.0–17.1)
LYMPH#: 1.6 10*3/uL (ref 0.9–3.3)
LYMPH%: 24.4 % (ref 14.0–49.0)
MCH: 33.3 pg (ref 27.2–33.4)
MCHC: 33.3 g/dL (ref 32.0–36.0)
MCV: 100 fL — AB (ref 79.3–98.0)
MONO#: 0.7 10*3/uL (ref 0.1–0.9)
MONO%: 11.4 % (ref 0.0–14.0)
NEUT#: 3.8 10*3/uL (ref 1.5–6.5)
NEUT%: 58 % (ref 39.0–75.0)
PLATELETS: 232 10*3/uL (ref 140–400)
RBC: 4.73 10*6/uL (ref 4.20–5.82)
RDW: 14 % (ref 11.0–14.6)
WBC: 6.5 10*3/uL (ref 4.0–10.3)

## 2016-04-26 LAB — MAGNESIUM: MAGNESIUM: 1.9 mg/dL (ref 1.5–2.5)

## 2016-04-26 LAB — LACTATE DEHYDROGENASE: LDH: 147 U/L (ref 125–245)

## 2016-04-26 LAB — PHOSPHORUS: PHOSPHORUS: 3.2 mg/dL (ref 2.5–4.6)

## 2016-04-26 MED ORDER — SODIUM CHLORIDE 0.9 % IV SOLN
240.0000 mg | Freq: Once | INTRAVENOUS | Status: AC
  Administered 2016-04-26: 240 mg via INTRAVENOUS
  Filled 2016-04-26: qty 24

## 2016-04-26 MED ORDER — SODIUM CHLORIDE 0.9 % IV SOLN
Freq: Once | INTRAVENOUS | Status: AC
  Administered 2016-04-26: 13:00:00 via INTRAVENOUS

## 2016-04-26 NOTE — Progress Notes (Signed)
BMS 370: Group A, Arm B3- Cycle 23/week 44 I met with patient and spouse in exam room after labs completed. Patient here for cycle 23. Resting V/S completed, patient maintaining weight. Patient denies new medications or changes to his current medications. Patient reports no new symptoms or A/E's. He does state he has an occasional productive cough with a small amount of white phlegm present.  Patient denies nausea, vomiting, bleeding, and reports no pain, denies skin rashes or diarrhea. Patient does report that he "feels has a little more energy than I did have" and reports appetite is about the same, not worse, but not improved. No edema to extremities present. Per MD assessment of patient and review of patient's labs and abnormal labs, deemed NCS by Dr. Julien Nordmann, patient cleared for Cycle 23 of Nivolumab today. All patient's and spouses questions answered to their satisfaction, patient was thanked for his continued support of study and encouraged to contact Dr. Julien Nordmann or myself with any questions or concerns he may. Study generated vial assignment provided to PharmD Raul Del.  Infusion treatment sign provided to Mayra Reel RN, all questions answered.  Patient knows to expect call from scheduling for CT to be scheduled on 05/31 Adele Dan, RN, BSN Clinical Research 04/26/2016 1:25 PM

## 2016-04-26 NOTE — Progress Notes (Signed)
[facility] [phone]   Fax:([phone]) [place], MD [address] [place] [address]  DIAGNOSIS: Stage IV (T3, N3, M1b) non-small cell lung cancer, adenocarcinoma diagnosed in January 2016 presented with right upper lobe lung mass in addition to mediastinal lymphadenopathy and large right pleural effusion as well as bone metastasis.  PRIOR THERAPY:  1. Status post Pleurx catheter placement by Dr. Servando Snare for drainage of the right pleural effusion.  2. Systemic chemotherapy with carboplatin for AUC of 5 and Alimta 500 MG/M2 every 3 weeks. Status post 6 cycles. 3. Status post palliative radiotherapy to the metastatic bone lesion in the right glenoid as well as the right ilium area completed 02/18/2016  CURRENT THERAPY: Patient is participating in the [place] 370 clinical trial and has been randomized to group A arm B-3 to receive Nivolumab 240 mg given every 2 weeks. First cycle expected 06/10/2015. Status post 22 cycles.  INTERVAL HISTORY: Omar Peters 81 y.o. male returns to the clinic today for follow-up visit accompanied by his wife. The patient has no complaints today. He tolerated the last cycle of his treatment with Nivolumab fairly well with no significant adverse effects. He denied having any significant fever or chills. He has no nausea, vomiting, diarrhea or constipation. The patient denied having any significant skin rash. He denied having any significant weight loss or night sweats. He has no chest pain, shortness of breath, but has mild cough with no hemoptysis. He is here today to start cycle #23.  MEDICAL HISTORY: Past Medical History  Diagnosis Date  . Hypertension   . GERD (gastroesophageal reflux disease)   . Shortness of breath dyspnea     01/14/15 at night and has to sit up  . Anxiety     due to diagnosis of Stage 4 Lung Cancer  . Macular degeneration of right eye   . Diabetes mellitus  without complication ([facility])   . Lung cancer ([place])     stage IV non small cell lung cancer dx. January 2016   . Bone cancer ([place])     non small cell lung with mets to bone    ALLERGIES:  is allergic to amitriptyline and marinol.  MEDICATIONS:  Current Outpatient Prescriptions  Medication Sig Dispense Refill  . amitriptyline (ELAVIL) 10 MG tablet Take 10 mg by mouth at bedtime.    Marland Kitchen amLODipine (NORVASC) 10 MG tablet Take 10 mg by mouth daily.    Marland Kitchen aspirin 81 MG tablet Take 81 mg by mouth daily.    . B Complex-Biotin-FA (B-COMPLEX PO) Take 1 each by mouth daily. Reported on 03/01/2016    . HYDROcodone-acetaminophen (NORCO) 5-325 MG per tablet Take 1 tablet by mouth every 6 (six) hours as needed for moderate pain. (Patient not taking: Reported on 02/02/2016) 30 tablet 0  . metFORMIN (GLUCOPHAGE) 500 MG tablet Take 500 mg by mouth daily.    . Naproxen Sodium (ALEVE PO) Take 1 tablet by mouth daily as needed. Reported on 04/11/2016    . prochlorperazine (COMPAZINE) 10 MG tablet TAKE ONE TABLET BY MOUTH EVERY 6 HOURS AS NEEDED FOR NAUSEA AND VOMITING (Patient not taking: Reported on 02/02/2016) 30 tablet 0  . ranitidine (ZANTAC) 150 MG tablet Take 150 mg by mouth daily.    Marland Kitchen senna-docusate (SENNA S) 8.6-50 MG per tablet Take 2 tablets by mouth daily.     No current facility-administered medications for this visit.  SURGICAL HISTORY:  Past Surgical History  Procedure Laterality Date  . Vein ligation and stripping  1977  . Chest tube insertion Right 01/15/2015    Procedure: INSERTION PLEURAL DRAINAGE CATHETER WITH ULTRASOUND AND FLURO GUIDANCE;  Surgeon: Grace Isaac, MD;  Location: [facility];  Service: Thoracic;  Laterality: Right;  . Removal of pleural drainage catheter Right 03/26/2015    Procedure: REMOVAL OF PLEURAL DRAINAGE CATHETER;  Surgeon: Grace Isaac, MD;  Location: [facility];  Service: Thoracic;  Laterality: Right;    REVIEW OF SYSTEMS:  A comprehensive review of systems was  negative except for: Respiratory: positive for cough   PHYSICAL EXAMINATION: General appearance: alert, cooperative and no distress Head: Normocephalic, without obvious abnormality, atraumatic Neck: no adenopathy, no JVD, supple, symmetrical, trachea midline and thyroid not enlarged, symmetric, no tenderness/mass/nodules Lymph nodes: Cervical, supraclavicular, and axillary nodes normal. Resp: clear to auscultation bilaterally Back: symmetric, no curvature. ROM normal. No CVA tenderness. Cardio: regular rate and rhythm, S1, S2 normal, no murmur, click, rub or gallop GI: soft, non-tender; bowel sounds normal; no masses,  no organomegaly Extremities: extremities normal, atraumatic, no cyanosis or edema Neurologic: Alert and oriented X 3, normal strength and tone. Normal symmetric reflexes. Normal coordination and gait  ECOG PERFORMANCE STATUS: 1 - Symptomatic but completely ambulatory  Blood pressure 140/61, pulse 101, temperature 98.1 F (36.7 C), temperature source Oral, resp. rate 18, height '5\' 6"'$  (1.676 m), weight 142 lb 9.6 oz (64.683 kg), SpO2 97 %.  LABORATORY DATA: Lab Results  Component Value Date   WBC 6.5 04/26/2016   HGB 15.7 04/26/2016   HCT 47.3 04/26/2016   MCV 100.0* 04/26/2016   PLT 232 04/26/2016      Chemistry      Component Value Date/Time   NA 138 04/26/2016 1126   NA 140 12/02/2015 0400   K 3.7 04/26/2016 1126   K 3.4* 12/02/2015 0400   CL 105 12/02/2015 0400   CO2 30* 04/26/2016 1126   CO2 28 12/02/2015 0400   BUN 8.9 04/26/2016 1126   BUN 8 12/02/2015 0400   CREATININE 0.9 04/26/2016 1126   CREATININE 0.76 12/02/2015 0400   GLU 231* 03/10/2015 1539      Component Value Date/Time   CALCIUM 9.5 04/26/2016 1126   CALCIUM 8.8* 12/02/2015 0400   ALKPHOS 106 04/26/2016 1126   ALKPHOS 71 12/02/2015 0400   AST 20 04/26/2016 1126   AST 18 12/02/2015 0400   ALT 21 04/26/2016 1126   ALT 16* 12/02/2015 0400   BILITOT 0.54 04/26/2016 1126   BILITOT 0.7  12/02/2015 0400       RADIOGRAPHIC STUDIES: No results found.  ASSESSMENT AND PLAN: This is a very pleasant 81 years old white male with history of stage IV non-small cell lung cancer, adenocarcinoma status post induction chemotherapy was carboplatin and Alimta and currently undergoing treatment with immunotherapy according to the BMS checkmated 370 clinical trial. He was randomized to the Nivolumab arm 240 mg IV every 2 weeks. He is status post 22 cycles and tolerating his treatment fairly well.  I recommended for the patient to continue his treatment with Nivolumab as scheduled The patient will proceed with cycle #23 today. The patient would come back for follow-up visit in 2 weeks for reevaluation before starting cycle #24. He would have repeat CT scan of the chest, abdomen and pelvis performed on 05/17/2016. The patient was advised to call immediately if he has any concerning symptoms in the interval. The patient voices  understanding of current disease status and treatment options and is in agreement with the current care plan.  All questions were answered. The patient knows to call the clinic with any problems, questions or concerns. We can certainly see the patient much sooner if necessary.  Disclaimer: This note was dictated with voice recognition software. Similar sounding words can inadvertently be transcribed and may not be corrected upon review.

## 2016-04-26 NOTE — Telephone Encounter (Signed)
Gave and printed appt sched and avs for pt for May and June  °

## 2016-04-26 NOTE — Patient Instructions (Signed)
[facility] Discharge Instructions for Patients Receiving Chemotherapy  Today you received the following chemotherapy agents Nivolumab  To help prevent nausea and vomiting after your treatment, we encourage you to take your nausea medication     If you develop nausea and vomiting that is not controlled by your nausea medication, call the clinic.   BELOW ARE SYMPTOMS THAT SHOULD BE REPORTED IMMEDIATELY:  *FEVER GREATER THAN 100.5 F  *CHILLS WITH OR WITHOUT FEVER  NAUSEA AND VOMITING THAT IS NOT CONTROLLED WITH YOUR NAUSEA MEDICATION  *UNUSUAL SHORTNESS OF BREATH  *UNUSUAL BRUISING OR BLEEDING  TENDERNESS IN MOUTH AND THROAT WITH OR WITHOUT PRESENCE OF ULCERS  *URINARY PROBLEMS  *BOWEL PROBLEMS  UNUSUAL RASH Items with * indicate a potential emergency and should be followed up as soon as possible.  Feel free to call the clinic you have any questions or concerns. The clinic phone number is [phone].  Please show the CHEMO ALERT CARD at check-in to the Emergency Department and triage nurse.   

## 2016-05-09 ENCOUNTER — Other Ambulatory Visit: Payer: Self-pay | Admitting: Medical Oncology

## 2016-05-09 DIAGNOSIS — C3411 Malignant neoplasm of upper lobe, right bronchus or lung: Secondary | ICD-10-CM

## 2016-05-10 ENCOUNTER — Encounter: Payer: Self-pay | Admitting: Medical Oncology

## 2016-05-10 ENCOUNTER — Ambulatory Visit (HOSPITAL_BASED_OUTPATIENT_CLINIC_OR_DEPARTMENT_OTHER): Payer: Medicare Other

## 2016-05-10 ENCOUNTER — Ambulatory Visit (HOSPITAL_BASED_OUTPATIENT_CLINIC_OR_DEPARTMENT_OTHER): Payer: Medicare Other | Admitting: Nurse Practitioner

## 2016-05-10 ENCOUNTER — Other Ambulatory Visit (HOSPITAL_COMMUNITY)
Admission: AD | Admit: 2016-05-10 | Discharge: 2016-05-10 | Disposition: A | Discharge status: Home / Self Care | Payer: Medicare Other | Source: Ambulatory Visit | Attending: Internal Medicine | Admitting: Internal Medicine

## 2016-05-10 ENCOUNTER — Other Ambulatory Visit: Payer: Self-pay | Admitting: Internal Medicine

## 2016-05-10 ENCOUNTER — Other Ambulatory Visit (HOSPITAL_BASED_OUTPATIENT_CLINIC_OR_DEPARTMENT_OTHER): Payer: Medicare Other

## 2016-05-10 VITALS — BP 150/69 | HR 96 | Temp 98.0°F | Resp 18 | Ht 66.0 in | Wt 142.3 lb

## 2016-05-10 DIAGNOSIS — C3411 Malignant neoplasm of upper lobe, right bronchus or lung: Secondary | ICD-10-CM

## 2016-05-10 DIAGNOSIS — Z006 Encounter for examination for normal comparison and control in clinical research program: Secondary | ICD-10-CM

## 2016-05-10 DIAGNOSIS — C7931 Secondary malignant neoplasm of brain: Secondary | ICD-10-CM

## 2016-05-10 DIAGNOSIS — C7951 Secondary malignant neoplasm of bone: Secondary | ICD-10-CM

## 2016-05-10 DIAGNOSIS — Z5112 Encounter for antineoplastic immunotherapy: Secondary | ICD-10-CM

## 2016-05-10 LAB — MAGNESIUM: Magnesium: 1.9 mg/dl (ref 1.5–2.5)

## 2016-05-10 LAB — CBC WITH DIFFERENTIAL/PLATELET
BASO%: 1.4 % (ref 0.0–2.0)
Basophils Absolute: 0.1 10*3/uL (ref 0.0–0.1)
EOS ABS: 0.4 10*3/uL (ref 0.0–0.5)
EOS%: 4.7 % (ref 0.0–7.0)
HEMATOCRIT: 42.4 % (ref 38.4–49.9)
HEMOGLOBIN: 14.3 g/dL (ref 13.0–17.1)
LYMPH%: 20.8 % (ref 14.0–49.0)
MCH: 33.6 pg — ABNORMAL HIGH (ref 27.2–33.4)
MCHC: 33.7 g/dL (ref 32.0–36.0)
MCV: 99.5 fL — ABNORMAL HIGH (ref 79.3–98.0)
MONO#: 0.8 10*3/uL (ref 0.1–0.9)
MONO%: 11.1 % (ref 0.0–14.0)
NEUT#: 4.7 10*3/uL (ref 1.5–6.5)
NEUT%: 62 % (ref 39.0–75.0)
Platelets: 215 10*3/uL (ref 140–400)
RBC: 4.26 10*6/uL (ref 4.20–5.82)
RDW: 14 % (ref 11.0–14.6)
WBC: 7.5 10*3/uL (ref 4.0–10.3)
lymph#: 1.6 10*3/uL (ref 0.9–3.3)

## 2016-05-10 LAB — COMPREHENSIVE METABOLIC PANEL
ALBUMIN: 3.1 g/dL — AB (ref 3.5–5.0)
ALK PHOS: 111 U/L (ref 40–150)
ALT: 18 U/L (ref 0–55)
AST: 17 U/L (ref 5–34)
Anion Gap: 9 mEq/L (ref 3–11)
BUN: 11.6 mg/dL (ref 7.0–26.0)
CALCIUM: 9.7 mg/dL (ref 8.4–10.4)
CHLORIDE: 102 meq/L (ref 98–109)
CO2: 28 mEq/L (ref 22–29)
CREATININE: 0.8 mg/dL (ref 0.7–1.3)
EGFR: 84 mL/min/{1.73_m2} — ABNORMAL LOW (ref >90)
GLUCOSE: 184 mg/dL — AB (ref 70–140)
POTASSIUM: 3.6 meq/L (ref 3.5–5.1)
SODIUM: 139 meq/L (ref 136–145)
Total Bilirubin: 0.43 mg/dL (ref 0.20–1.20)
Total Protein: 7.3 g/dL (ref 6.4–8.3)

## 2016-05-10 LAB — PHOSPHORUS: PHOSPHORUS: 3.3 mg/dL (ref 2.5–4.6)

## 2016-05-10 LAB — LACTATE DEHYDROGENASE: LDH: 121 U/L — ABNORMAL LOW (ref 125–245)

## 2016-05-10 MED ORDER — SODIUM CHLORIDE 0.9 % IV SOLN
240.0000 mg | Freq: Once | INTRAVENOUS | Status: AC
  Administered 2016-05-10: 240 mg via INTRAVENOUS
  Filled 2016-05-10: qty 24

## 2016-05-10 MED ORDER — SODIUM CHLORIDE 0.9 % IV SOLN
Freq: Once | INTRAVENOUS | Status: AC
  Administered 2016-05-10: 13:00:00 via INTRAVENOUS

## 2016-05-10 MED ORDER — AMITRIPTYLINE HCL 10 MG PO TABS: 10.0000 mg | ORAL_TABLET | Freq: Every day | ORAL | Status: DC

## 2016-05-10 NOTE — Progress Notes (Signed)
  [facility] OFFICE PROGRESS NOTE    DIAGNOSIS: Stage IV (T3, N3, M1b) non-small cell lung cancer, adenocarcinoma diagnosed in January 2016 presented with right upper lobe lung mass in addition to mediastinal lymphadenopathy and large right pleural effusion as well as bone metastasis.  PRIOR THERAPY:  1. Status post Pleurx catheter placement by Dr. Servando Snare for drainage of the right pleural effusion.  2. Systemic chemotherapy with carboplatin for AUC of 5 and Alimta 500 MG/M2 every 3 weeks. Status post 6 cycles. 3. Status post palliative radiotherapy to the metastatic bone lesion in the right glenoid as well as the right ilium area completed 02/18/2016  CURRENT THERAPY: Patient is participating in the [place] 370 clinical trial and has been randomized to group A arm B-3 to receive Nivolumab 240 mg given every 2 weeks. First cycle  06/10/2015. Status post 23 cycles.  INTERVAL HISTORY:   Omar Peters the returns as scheduled. He completed cycle 23 nivolumab 04/26/2016. He denies nausea/vomiting. No mouth sores. No diarrhea. No rash. He denies shortness of breath. Stable mild cough. No hemoptysis. No change in appetite. He reports his weight is stable. He denies pain. He continues Elavil at bedtime for sleep. He would like a refill.  Objective:  Vital signs in last 24 hours:  Blood pressure 150/69, pulse 96, temperature 98 F (36.7 C), temperature source Oral, resp. rate 18, height '5\' 6"'$  (1.676 m), weight 142 lb 4.8 oz (64.547 kg), SpO2 99 %.    HEENT: No thrush or ulcers. Lymphatics: No palpable cervical or supraclavicular lymph nodes. Resp: Lungs clear bilaterally. Cardio: Regular rate and rhythm. GI: Abdomen soft and nontender. No hepatomegaly. Vascular: No leg edema. Calves soft and nontender.   Lab Results:  Lab Results  Component Value Date   WBC 7.5 05/10/2016   HGB 14.3 05/10/2016   HCT 42.4 05/10/2016   MCV 99.5* 05/10/2016   PLT 215 05/10/2016   NEUTROABS 4.7 05/10/2016    Imaging:  No results found.  Medications: I have reviewed the patient's current medications.  Assessment/Plan: 1. Stage IV non-small cell lung cancer, adenocarcinoma, right upper lobe, metastatic to bone status post induction chemotherapy with carboplatin and Alimta, currently undergoing treatment with immunotherapy according to the BMS checkmate 370 clinical trial. He was randomized to the Nivolumab arm 240 mg IV every 2 weeks. He is status post 23 cycles.   Disposition: Omar Peters appears stable. He has completed 23 cycles of nivolumab. Plan to proceed with cycle 24 today as scheduled. Staging CT evaluation 05/17/2016. He will return for a follow-up visit with Dr. Julien Nordmann on 05/24/2016. He will contact the office in the interim with any problems.    [name] ANP/GNP-BC   05/10/2016  12:07 PM

## 2016-05-10 NOTE — Progress Notes (Signed)
BMS 370: Group A, Arm B-3: Cycle 24/week 46 Patient here with spouse today for cycle 24 of on-study treatment with Nivolumab. I met with patient and spouse after his lab appointment and prior to V/S assessment. Patient maintaining weight, but does report no improvement in appetite. I reviewed patient's concomitant medications and patient reports no new meds. Patient reports to have been "feeling weaker than normal, no energy last week," and does report this week is better. Patient denies any pain, denies nausea or vomiting, denies bowel/bladder concerns, denies SOB or wheezing. Patient also denies skin issues; rashes or bruising. Patient denies any bleeding or edema to extremities. Patient also reports no headaches or neuro concerns. He reports to have seen his eye doctor yesterday and was told that his macular degeneration has improved, but vision has slightly worsened, and denies any inflammation or unusual vision problems. Patient also continues to report an occasional product cough with white sputum. Per NP [name]' assessment of patient and review of labs and NCS labs, patient cleared to continue with Cycle 24 today. Study dispensed vial assignment provided to PharmD Nadean Corwin. Treatment infusion directions sign given to Mariana Kaufman, RN and all questions answered. All patient's and spouse's questions answered to their satisfaction. They were both encouraged to call Dr. Julien Nordmann or myself with any questions/concerns they may have.  Spouse informed me today that they will be taking a family vacation from July 9-16, and this should not interfere with treatment, as of today. Patient aware of CT appointment on 5/31 Patient was re consented today with version 3 consent, dated 04/04/2016. Changes to study/consent were read to patient and spouse and after all their questions were answered to their satisfaction, patient proceeded to initial each page and sign the last two pages, spouse dated each page for  patient due to his inability to see the line and write the date well.  Adele Dan, RN, BSN Clinical Research 05/10/2016 2:25 PM

## 2016-05-10 NOTE — Patient Instructions (Signed)
[facility] Discharge Instructions for Patients Receiving Chemotherapy  Today you received the following chemotherapy agents: NIVOLUMAB   To help prevent nausea and vomiting after your treatment, we encourage you to take your nausea medication.   If you develop nausea and vomiting that is not controlled by your nausea medication, call the clinic.   BELOW ARE SYMPTOMS THAT SHOULD BE REPORTED IMMEDIATELY:  *FEVER GREATER THAN 100.5 F  *CHILLS WITH OR WITHOUT FEVER  NAUSEA AND VOMITING THAT IS NOT CONTROLLED WITH YOUR NAUSEA MEDICATION  *UNUSUAL SHORTNESS OF BREATH  *UNUSUAL BRUISING OR BLEEDING  TENDERNESS IN MOUTH AND THROAT WITH OR WITHOUT PRESENCE OF ULCERS  *URINARY PROBLEMS  *BOWEL PROBLEMS  UNUSUAL RASH Items with * indicate a potential emergency and should be followed up as soon as possible.  Feel free to call the clinic you have any questions or concerns. The clinic phone number is [phone].  Please show the [facility] at check-in to the Emergency Department and triage nurse.

## 2016-05-16 ENCOUNTER — Other Ambulatory Visit: Payer: Self-pay | Admitting: *Deleted

## 2016-05-16 DIAGNOSIS — R110 Nausea: Secondary | ICD-10-CM

## 2016-05-16 DIAGNOSIS — T451X5A Adverse effect of antineoplastic and immunosuppressive drugs, initial encounter: Principal | ICD-10-CM

## 2016-05-16 MED ORDER — PROCHLORPERAZINE MALEATE 10 MG PO TABS: 10.0000 mg | ORAL_TABLET | Freq: Four times a day (QID) | ORAL | Status: AC | PRN

## 2016-05-17 ENCOUNTER — Ambulatory Visit (HOSPITAL_COMMUNITY)
Admission: RE | Admit: 2016-05-17 | Discharge: 2016-05-17 | Disposition: A | Discharge status: Home / Self Care | Payer: Medicare Other | Source: Ambulatory Visit | Attending: Internal Medicine | Admitting: Internal Medicine

## 2016-05-17 ENCOUNTER — Encounter (HOSPITAL_COMMUNITY): Payer: Self-pay

## 2016-05-17 DIAGNOSIS — N400 Enlarged prostate without lower urinary tract symptoms: Secondary | ICD-10-CM | POA: Diagnosis not present

## 2016-05-17 DIAGNOSIS — K7460 Unspecified cirrhosis of liver: Secondary | ICD-10-CM | POA: Insufficient documentation

## 2016-05-17 DIAGNOSIS — Z5112 Encounter for antineoplastic immunotherapy: Secondary | ICD-10-CM | POA: Diagnosis not present

## 2016-05-17 DIAGNOSIS — C3411 Malignant neoplasm of upper lobe, right bronchus or lung: Secondary | ICD-10-CM | POA: Insufficient documentation

## 2016-05-17 DIAGNOSIS — C7951 Secondary malignant neoplasm of bone: Secondary | ICD-10-CM | POA: Diagnosis not present

## 2016-05-17 DIAGNOSIS — J941 Fibrothorax: Secondary | ICD-10-CM | POA: Diagnosis not present

## 2016-05-17 DIAGNOSIS — I2510 Atherosclerotic heart disease of native coronary artery without angina pectoris: Secondary | ICD-10-CM | POA: Diagnosis not present

## 2016-05-17 MED ORDER — IOPAMIDOL (ISOVUE-300) INJECTION 61%
100.0000 mL | Freq: Once | INTRAVENOUS | Status: AC | PRN
  Administered 2016-05-17: 100 mL via INTRAVENOUS

## 2016-05-18 ENCOUNTER — Telehealth: Payer: Self-pay | Admitting: Internal Medicine

## 2016-05-18 ENCOUNTER — Other Ambulatory Visit: Payer: Self-pay | Admitting: Medical Oncology

## 2016-05-18 DIAGNOSIS — C3411 Malignant neoplasm of upper lobe, right bronchus or lung: Secondary | ICD-10-CM

## 2016-05-18 NOTE — Telephone Encounter (Signed)
appt made per 6/1 pof. Per CB ok to schedule due to MD overbooked and PA on PAL

## 2016-05-23 ENCOUNTER — Other Ambulatory Visit: Payer: Self-pay | Admitting: Medical Oncology

## 2016-05-23 DIAGNOSIS — Z79899 Other long term (current) drug therapy: Secondary | ICD-10-CM

## 2016-05-24 ENCOUNTER — Other Ambulatory Visit (HOSPITAL_COMMUNITY)
Admission: RE | Admit: 2016-05-24 | Discharge: 2016-05-24 | Disposition: A | Discharge status: Home / Self Care | Payer: Medicare Other | Source: Ambulatory Visit | Attending: Internal Medicine | Admitting: Internal Medicine

## 2016-05-24 ENCOUNTER — Encounter: Payer: Self-pay | Admitting: Medical Oncology

## 2016-05-24 ENCOUNTER — Telehealth: Payer: Self-pay | Admitting: Internal Medicine

## 2016-05-24 ENCOUNTER — Encounter: Payer: Self-pay | Admitting: Internal Medicine

## 2016-05-24 ENCOUNTER — Encounter: Payer: Self-pay | Admitting: Oncology

## 2016-05-24 ENCOUNTER — Ambulatory Visit (HOSPITAL_BASED_OUTPATIENT_CLINIC_OR_DEPARTMENT_OTHER): Payer: Medicare Other

## 2016-05-24 ENCOUNTER — Ambulatory Visit (HOSPITAL_BASED_OUTPATIENT_CLINIC_OR_DEPARTMENT_OTHER): Payer: Medicare Other | Admitting: Internal Medicine

## 2016-05-24 ENCOUNTER — Other Ambulatory Visit (HOSPITAL_BASED_OUTPATIENT_CLINIC_OR_DEPARTMENT_OTHER): Payer: Medicare Other

## 2016-05-24 VITALS — BP 144/63 | HR 97 | Temp 97.6°F | Resp 17 | Ht 66.0 in | Wt 142.0 lb

## 2016-05-24 DIAGNOSIS — C7951 Secondary malignant neoplasm of bone: Secondary | ICD-10-CM | POA: Diagnosis not present

## 2016-05-24 DIAGNOSIS — Z79899 Other long term (current) drug therapy: Secondary | ICD-10-CM | POA: Diagnosis not present

## 2016-05-24 DIAGNOSIS — C3411 Malignant neoplasm of upper lobe, right bronchus or lung: Secondary | ICD-10-CM

## 2016-05-24 DIAGNOSIS — Z006 Encounter for examination for normal comparison and control in clinical research program: Secondary | ICD-10-CM | POA: Diagnosis not present

## 2016-05-24 DIAGNOSIS — Z5112 Encounter for antineoplastic immunotherapy: Secondary | ICD-10-CM | POA: Diagnosis not present

## 2016-05-24 LAB — CBC WITH DIFFERENTIAL/PLATELET
BASO%: 1.1 % (ref 0.0–2.0)
Basophils Absolute: 0.1 10*3/uL (ref 0.0–0.1)
EOS%: 5 % (ref 0.0–7.0)
Eosinophils Absolute: 0.4 10*3/uL (ref 0.0–0.5)
HCT: 43 % (ref 38.4–49.9)
HGB: 14.6 g/dL (ref 13.0–17.1)
LYMPH%: 18.5 % (ref 14.0–49.0)
MCH: 33.9 pg — ABNORMAL HIGH (ref 27.2–33.4)
MCHC: 34 g/dL (ref 32.0–36.0)
MCV: 100 fL — ABNORMAL HIGH (ref 79.3–98.0)
MONO#: 1.1 10*3/uL — ABNORMAL HIGH (ref 0.1–0.9)
MONO%: 12 % (ref 0.0–14.0)
NEUT#: 5.7 10*3/uL (ref 1.5–6.5)
NEUT%: 63.4 % (ref 39.0–75.0)
PLATELETS: 234 10*3/uL (ref 140–400)
RBC: 4.3 10*6/uL (ref 4.20–5.82)
RDW: 13.2 % (ref 11.0–14.6)
WBC: 9 10*3/uL (ref 4.0–10.3)
lymph#: 1.7 10*3/uL (ref 0.9–3.3)

## 2016-05-24 LAB — COMPREHENSIVE METABOLIC PANEL
ALT: 14 U/L (ref 0–55)
ANION GAP: 8 meq/L (ref 3–11)
AST: 17 U/L (ref 5–34)
Albumin: 3 g/dL — ABNORMAL LOW (ref 3.5–5.0)
Alkaline Phosphatase: 105 U/L (ref 40–150)
BUN: 9.4 mg/dL (ref 7.0–26.0)
CHLORIDE: 102 meq/L (ref 98–109)
CO2: 28 meq/L (ref 22–29)
CREATININE: 0.8 mg/dL (ref 0.7–1.3)
Calcium: 9.6 mg/dL (ref 8.4–10.4)
EGFR: 84 mL/min/{1.73_m2} — ABNORMAL LOW (ref >90)
Glucose: 143 mg/dl — ABNORMAL HIGH (ref 70–140)
POTASSIUM: 3.9 meq/L (ref 3.5–5.1)
Sodium: 138 mEq/L (ref 136–145)
Total Bilirubin: 0.35 mg/dL (ref 0.20–1.20)
Total Protein: 7.4 g/dL (ref 6.4–8.3)

## 2016-05-24 LAB — LACTATE DEHYDROGENASE: LDH: 162 U/L (ref 125–245)

## 2016-05-24 LAB — PHOSPHORUS: PHOSPHORUS: 3.2 mg/dL (ref 2.5–4.6)

## 2016-05-24 LAB — MAGNESIUM: Magnesium: 2.2 mg/dl (ref 1.5–2.5)

## 2016-05-24 LAB — TSH: TSH: 4.405 m[IU]/L — AB (ref 0.320–4.118)

## 2016-05-24 MED ORDER — SODIUM CHLORIDE 0.9 % IV SOLN
240.0000 mg | Freq: Once | INTRAVENOUS | Status: AC
  Administered 2016-05-24: 240 mg via INTRAVENOUS
  Filled 2016-05-24: qty 24

## 2016-05-24 MED ORDER — SODIUM CHLORIDE 0.9 % IV SOLN
Freq: Once | INTRAVENOUS | Status: AC
  Administered 2016-05-24: 12:00:00 via INTRAVENOUS

## 2016-05-24 NOTE — Progress Notes (Signed)
[place] [phone]   [phone]) [place], MD [address] [place] [address]  DIAGNOSIS: Stage IV (T3, N3, M1b) non-small cell lung cancer, adenocarcinoma diagnosed in January 2016 presented with right upper lobe lung mass in addition to mediastinal lymphadenopathy and large right pleural effusion as well as bone metastasis.  PRIOR THERAPY:  1. Status post Pleurx catheter placement by Dr. Servando Snare for drainage of the right pleural effusion.  2. Systemic chemotherapy with carboplatin for AUC of 5 and Alimta 500 MG/M2 every 3 weeks. Status post 6 cycles. 3. Status post palliative radiotherapy to the metastatic bone lesion in the right glenoid as well as the right ilium area completed 02/18/2016  CURRENT THERAPY: Patient is participating in the [place] 370 clinical trial and has been randomized to group A arm B-3 to receive Nivolumab 240 mg given every 2 weeks. First cycle expected 06/10/2015. Status post 24 cycles.  INTERVAL HISTORY: ROMELL WOLDEN 81 y.o. male returns to the clinic today for follow-up visit accompanied by his wife. The patient has no complaints today. He tolerated the last cycle of his treatment with Nivolumab fairly well with no significant adverse effects. He denied having any significant fever or chills. He has no nausea, vomiting, diarrhea or constipation. The patient denied having any significant skin rash. He denied having any significant weight loss or night sweats. He has no chest pain, shortness of breath, cough or hemoptysis. He had repeat CT scan of the chest, abdomen and pelvis performed recently and he is here for evaluation and discussion of his scan results.  MEDICAL HISTORY: Past Medical History  Diagnosis Date  . Hypertension   . GERD (gastroesophageal reflux disease)   . Shortness of breath dyspnea     01/14/15 at night and has to sit up  . Anxiety     due to diagnosis of  Stage 4 Lung Cancer  . Macular degeneration of right eye   . Diabetes mellitus without complication ([place])   . Lung cancer ([place])     stage IV non small cell lung cancer dx. January 2016   . Bone cancer ([place])     non small cell lung with mets to bone    ALLERGIES:  is allergic to amitriptyline and marinol.  MEDICATIONS:  Current Outpatient Prescriptions  Medication Sig Dispense Refill  . amitriptyline (ELAVIL) 10 MG tablet Take 1 tablet (10 mg total) by mouth at bedtime. 30 tablet 0  . amLODipine (NORVASC) 10 MG tablet Take 10 mg by mouth daily.    Marland Kitchen aspirin 81 MG tablet Take 81 mg by mouth daily.    . B Complex-Biotin-FA (B-COMPLEX PO) Take 1 each by mouth daily. Reported on 03/01/2016    . HYDROcodone-acetaminophen (NORCO) 5-325 MG per tablet Take 1 tablet by mouth every 6 (six) hours as needed for moderate pain. (Patient not taking: Reported on 05/10/2016) 30 tablet 0  . losartan-hydrochlorothiazide (HYZAAR) 100-25 MG tablet     . metFORMIN (GLUCOPHAGE) 500 MG tablet Take 500 mg by mouth daily.    . Naproxen Sodium (ALEVE PO) Take 1 tablet by mouth daily as needed. Reported on 04/11/2016    . prochlorperazine (COMPAZINE) 10 MG tablet Take 1 tablet (10 mg total) by mouth every 6 (six) hours as needed for nausea or vomiting. 30 tablet 0  . ranitidine (ZANTAC) 150 MG tablet Take 150 mg by mouth daily.    Marland Kitchen senna-docusate (  SENNA S) 8.6-50 MG per tablet Take 2 tablets by mouth daily.     No current facility-administered medications for this visit.    SURGICAL HISTORY:  Past Surgical History  Procedure Laterality Date  . Vein ligation and stripping  1977  . Chest tube insertion Right 01/15/2015    Procedure: INSERTION PLEURAL DRAINAGE CATHETER WITH ULTRASOUND AND FLURO GUIDANCE;  Surgeon: Grace Isaac, MD;  Location: [name];  Service: Thoracic;  Laterality: Right;  . Removal of pleural drainage catheter Right 03/26/2015    Procedure: REMOVAL OF PLEURAL DRAINAGE CATHETER;  Surgeon: Grace Isaac, MD;  Location: [facility];  Service: Thoracic;  Laterality: Right;    REVIEW OF SYSTEMS:  Constitutional: negative Eyes: negative Ears, nose, mouth, throat, and face: negative Respiratory: negative Cardiovascular: negative Gastrointestinal: negative Genitourinary:negative Integument/breast: negative Hematologic/lymphatic: negative Musculoskeletal:negative Neurological: negative Behavioral/Psych: negative Endocrine: negative Allergic/Immunologic: negative   PHYSICAL EXAMINATION: General appearance: alert, cooperative and no distress Head: Normocephalic, without obvious abnormality, atraumatic Neck: no adenopathy, no JVD, supple, symmetrical, trachea midline and thyroid not enlarged, symmetric, no tenderness/mass/nodules Lymph nodes: Cervical, supraclavicular, and axillary nodes normal. Resp: clear to auscultation bilaterally Back: symmetric, no curvature. ROM normal. No CVA tenderness. Cardio: regular rate and rhythm, S1, S2 normal, no murmur, click, rub or gallop GI: soft, non-tender; bowel sounds normal; no masses,  no organomegaly Extremities: extremities normal, atraumatic, no cyanosis or edema Neurologic: Alert and oriented X 3, normal strength and tone. Normal symmetric reflexes. Normal coordination and gait  ECOG PERFORMANCE STATUS: 1 - Symptomatic but completely ambulatory  Blood pressure 144/63, pulse 97, temperature 97.6 F (36.4 C), temperature source Oral, resp. rate 17, height '5\' 6"'$  (1.676 m), weight 142 lb (64.411 kg), SpO2 98 %.  LABORATORY DATA: Lab Results  Component Value Date   WBC 9.0 05/24/2016   HGB 14.6 05/24/2016   HCT 43.0 05/24/2016   MCV 100.0* 05/24/2016   PLT 234 05/24/2016      Chemistry      Component Value Date/Time   NA 139 05/10/2016 1057   NA 140 12/02/2015 0400   K 3.6 05/10/2016 1057   K 3.4* 12/02/2015 0400   CL 105 12/02/2015 0400   CO2 28 05/10/2016 1057   CO2 28 12/02/2015 0400   BUN 11.6 05/10/2016 1057   BUN 8  12/02/2015 0400   CREATININE 0.8 05/10/2016 1057   CREATININE 0.76 12/02/2015 0400   GLU 231* 03/10/2015 1539      Component Value Date/Time   CALCIUM 9.7 05/10/2016 1057   CALCIUM 8.8* 12/02/2015 0400   ALKPHOS 111 05/10/2016 1057   ALKPHOS 71 12/02/2015 0400   AST 17 05/10/2016 1057   AST 18 12/02/2015 0400   ALT 18 05/10/2016 1057   ALT 16* 12/02/2015 0400   BILITOT 0.43 05/10/2016 1057   BILITOT 0.7 12/02/2015 0400       RADIOGRAPHIC STUDIES: Ct Chest W Contrast  05/22/2016  ADDENDUM REPORT: 05/22/2016 08:53 ADDENDUM: RECIST 1.1 Target Lesions: Right upper lobe mass, 6.6 x 4.1 cm (series 4, image 44), stable. Non-target Lesions: 1. AP window lymph node, present (series 2, image 23). 2. Subcarinal lymph node, present (series 2, image 28). 3. Right iliac metastasis, present. 4. Right pleural effusion, present. 5. Low right paratracheal lymph node, present (series 2, image 23). 6. Left lower lobe pulmonary nodularity, present. 7. Right glenoid metastasis, present. Electronically Signed   By: Suzy Bouchard M.D.   On: 05/22/2016 08:53  05/22/2016  CLINICAL DATA:  Right lung  cancer, bone metastases. Chemotherapy in progress. Radiation therapy to shoulder and hip. EXAM: CT CHEST, ABDOMEN, AND PELVIS WITH CONTRAST TECHNIQUE: Multidetector CT imaging of the chest, abdomen and pelvis was performed following the standard protocol during bolus administration of intravenous contrast. CONTRAST:  179m ISOVUE-300 IOPAMIDOL (ISOVUE-300) INJECTION 61% COMPARISON:  03/22/2016. FINDINGS: RECIST 1.1 Target Lesions: Right upper lobe mass, 6.6 x 4.1 cm (series 4, image 44), stable. Non-target Lesions: 1. AP window lymph node, present (series 2, image 23). 2. Subcarinal lymph node, present (series 2, image 28). 3. Right iliac metastasis, present. 4. Right pleural effusion, present. 5. Low right paratracheal lymph node, present (series 2, image 23). 6. Left lower lobe pulmonary nodularity, present. 7. Right  glenoid metastasis, present. 8. High right paratracheal lymph node, present (series 2, image 12). CT CHEST FINDINGS Mediastinum/Lymph Nodes: Sub cm low-attenuation lesion in the right thyroid, stable. Mediastinal lymph nodes measure up to 13 mm in the high right paratracheal station (series 2, image 12), stable. Ill-defined low-attenuation is again seen in the right paratracheal and subcarinal regions. No left hilar or axillary adenopathy. Atherosclerotic calcification of the arterial vasculature, including coronary arteries. Heart size normal. No pericardial effusion. Lungs/Pleura: Masslike consolidation in the right upper and right middle lobes remains grossly stable, measuring 4.1 x 6.6 cm. There is an associated rind of pleural fluid with thickened margins, similar. Ground-glass, septal thickening, architectural distortion and subpleural consolidation in the right lower lobe are unchanged as well. Centrilobular emphysema. Minimal peribronchovascular nodularity in the peripheral left lower lobe, unchanged. No left pleural fluid. Debris is seen dependently in the right mainstem bronchus and bronchus intermedius. Musculoskeletal: Lytic lesions in the spine, ribs and right glenoid are grossly similar. CT ABDOMEN PELVIS FINDINGS Hepatobiliary: Liver margin is irregular. Liver and gallbladder are otherwise unremarkable. Common bile duct measures 12 mm, stable. No intrahepatic biliary ductal dilatation. Pancreas: Negative. Spleen: Negative. Adrenals/Urinary Tract: Adrenal glands are unremarkable. Sub cm low-attenuation lesion in the right kidney are too small to characterize. Ureters are decompressed. Bladder is unremarkable. Stomach/Bowel: Stomach, small bowel, appendix and colon are unremarkable. Vascular/Lymphatic: Atherosclerotic calcification of the arterial vasculature without abdominal aortic aneurysm. Recanalized paraumbilical vein. Retroperitoneal lymph nodes are not enlarged by CT size criteria. Reproductive:  Prostate is enlarged. Other: No free fluid. Mesenteries and peritoneum are unremarkable. Small bilateral inguinal hernias contain fat. Musculoskeletal: Lytic bilateral iliac wing metastases appear stable. IMPRESSION: 1. Masslike consolidation in the right hemi thorax is grossly stable, as are mediastinal lymph nodes and osseous metastases. 2. Post treatment changes in the right hemi thorax, including a a small fibrothorax. 3. Coronary artery calcification. 4. Cirrhosis. 5. Enlarged prostate. Electronically Signed: By: MLorin PicketM.D. On: 05/17/2016 09:57   Ct Abdomen Pelvis W Contrast  05/22/2016  ADDENDUM REPORT: 05/22/2016 08:53 ADDENDUM: RECIST 1.1 Target Lesions: Right upper lobe mass, 6.6 x 4.1 cm (series 4, image 44), stable. Non-target Lesions: 1. AP window lymph node, present (series 2, image 23). 2. Subcarinal lymph node, present (series 2, image 28). 3. Right iliac metastasis, present. 4. Right pleural effusion, present. 5. Low right paratracheal lymph node, present (series 2, image 23). 6. Left lower lobe pulmonary nodularity, present. 7. Right glenoid metastasis, present. Electronically Signed   By: SSuzy BouchardM.D.   On: 05/22/2016 08:53  05/22/2016  CLINICAL DATA:  Right lung cancer, bone metastases. Chemotherapy in progress. Radiation therapy to shoulder and hip. EXAM: CT CHEST, ABDOMEN, AND PELVIS WITH CONTRAST TECHNIQUE: Multidetector CT imaging of the chest, abdomen and pelvis  was performed following the standard protocol during bolus administration of intravenous contrast. CONTRAST:  168m ISOVUE-300 IOPAMIDOL (ISOVUE-300) INJECTION 61% COMPARISON:  03/22/2016. FINDINGS: RECIST 1.1 Target Lesions: Right upper lobe mass, 6.6 x 4.1 cm (series 4, image 44), stable. Non-target Lesions: 1. AP window lymph node, present (series 2, image 23). 2. Subcarinal lymph node, present (series 2, image 28). 3. Right iliac metastasis, present. 4. Right pleural effusion, present. 5. Low right  paratracheal lymph node, present (series 2, image 23). 6. Left lower lobe pulmonary nodularity, present. 7. Right glenoid metastasis, present. 8. High right paratracheal lymph node, present (series 2, image 12). CT CHEST FINDINGS Mediastinum/Lymph Nodes: Sub cm low-attenuation lesion in the right thyroid, stable. Mediastinal lymph nodes measure up to 13 mm in the high right paratracheal station (series 2, image 12), stable. Ill-defined low-attenuation is again seen in the right paratracheal and subcarinal regions. No left hilar or axillary adenopathy. Atherosclerotic calcification of the arterial vasculature, including coronary arteries. Heart size normal. No pericardial effusion. Lungs/Pleura: Masslike consolidation in the right upper and right middle lobes remains grossly stable, measuring 4.1 x 6.6 cm. There is an associated rind of pleural fluid with thickened margins, similar. Ground-glass, septal thickening, architectural distortion and subpleural consolidation in the right lower lobe are unchanged as well. Centrilobular emphysema. Minimal peribronchovascular nodularity in the peripheral left lower lobe, unchanged. No left pleural fluid. Debris is seen dependently in the right mainstem bronchus and bronchus intermedius. Musculoskeletal: Lytic lesions in the spine, ribs and right glenoid are grossly similar. CT ABDOMEN PELVIS FINDINGS Hepatobiliary: Liver margin is irregular. Liver and gallbladder are otherwise unremarkable. Common bile duct measures 12 mm, stable. No intrahepatic biliary ductal dilatation. Pancreas: Negative. Spleen: Negative. Adrenals/Urinary Tract: Adrenal glands are unremarkable. Sub cm low-attenuation lesion in the right kidney are too small to characterize. Ureters are decompressed. Bladder is unremarkable. Stomach/Bowel: Stomach, small bowel, appendix and colon are unremarkable. Vascular/Lymphatic: Atherosclerotic calcification of the arterial vasculature without abdominal aortic  aneurysm. Recanalized paraumbilical vein. Retroperitoneal lymph nodes are not enlarged by CT size criteria. Reproductive: Prostate is enlarged. Other: No free fluid. Mesenteries and peritoneum are unremarkable. Small bilateral inguinal hernias contain fat. Musculoskeletal: Lytic bilateral iliac wing metastases appear stable. IMPRESSION: 1. Masslike consolidation in the right hemi thorax is grossly stable, as are mediastinal lymph nodes and osseous metastases. 2. Post treatment changes in the right hemi thorax, including a a small fibrothorax. 3. Coronary artery calcification. 4. Cirrhosis. 5. Enlarged prostate. Electronically Signed: By: MLorin PicketM.D. On: 05/17/2016 09:57    ASSESSMENT AND PLAN: This is a very pleasant 81years old white male with history of stage IV non-small cell lung cancer, adenocarcinoma status post induction chemotherapy was carboplatin and Alimta and currently undergoing treatment with immunotherapy according to the [employer] [place] 370 clinical trial. He was randomized to the Nivolumab arm 240 mg IV every 2 weeks. He is status post 24 cycles and tolerating his treatment fairly well.  The recent CT scan of the chest, abdomen and pelvis showed stable disease. I discussed the scan results with the patient and his wife. I recommended for the patient to continue his treatment with Nivolumab and he will proceed with cycle #25 today as a scheduled.  The patient would come back for follow-up visit in 2 weeks for reevaluation before starting cycle #26. The patient was advised to call immediately if he has any concerning symptoms in the interval. The patient voices understanding of current disease status and treatment options and is in  agreement with the current care plan.  All questions were answered. The patient knows to call the clinic with any problems, questions or concerns. We can certainly see the patient much sooner if necessary.  Disclaimer: This note was dictated with voice  recognition software. Similar sounding words can inadvertently be transcribed and may not be corrected upon review.

## 2016-05-24 NOTE — Progress Notes (Signed)
[facility] 370 Group A, Arm B3: Cycle 25/ week 48 Patient returns to clinic for cycle 25 of on study Nivolumab treatment. Patient completed study required PRO's, lab draw and MD visit. I met with patient and spouse prior to patient's scheduled office visit with Dr. Julien Nordmann. VSS. Patient maintaining weight. Concomitant medications reviewed and patient reports no new medications or changes to current medication list. Patient denies any new issues/side effects and reports that he is felling well and reports no significant changes. Patient denies pain, nausea, vomiting, bowel or bladder, reports having no diarrhea. Patient also denies any rashes, neurological concerns or breathing changes. Per MD assessment of patient and review of labs and NCS labs, patient cleared for cycle 25 treatment. Study vial assignment provided to PharmD Romualdo Bolk, treatment infusion directions provided to Kathe Becton RN, all questions answered. All patient's and spouse's questions answered to their satisfaction, patient encouraged to call clinic, Dr. Julien Nordmann or myself with any questions or concerns.  Dr. Julien Nordmann reviewed recent CT scan with patient and spouse and reports "stable disease." Adele Dan, RN, BSN Clinical Research 05/24/2016 11:54 AM

## 2016-05-24 NOTE — Patient Instructions (Signed)
[facility] Discharge Instructions for Patients Receiving Chemotherapy  Today you received the following chemotherapy agents Nivolumab  To help prevent nausea and vomiting after your treatment, we encourage you to take your nausea medication     If you develop nausea and vomiting that is not controlled by your nausea medication, call the clinic.   BELOW ARE SYMPTOMS THAT SHOULD BE REPORTED IMMEDIATELY:  *FEVER GREATER THAN 100.5 F  *CHILLS WITH OR WITHOUT FEVER  NAUSEA AND VOMITING THAT IS NOT CONTROLLED WITH YOUR NAUSEA MEDICATION  *UNUSUAL SHORTNESS OF BREATH  *UNUSUAL BRUISING OR BLEEDING  TENDERNESS IN MOUTH AND THROAT WITH OR WITHOUT PRESENCE OF ULCERS  *URINARY PROBLEMS  *BOWEL PROBLEMS  UNUSUAL RASH Items with * indicate a potential emergency and should be followed up as soon as possible.  Feel free to call the clinic you have any questions or concerns. The clinic phone number is [phone].  Please show the CHEMO ALERT CARD at check-in to the Emergency Department and triage nurse.   

## 2016-05-24 NOTE — Progress Notes (Signed)
05/24/16 - BMS [id number] - Questionnaires (PRO's) - Patient into the cancer center for routine visit.  Patient given PRO's upon arrival to the cancer center.  Patient completed PRO's.  The PRO's were checked for completeness. The patient was thanked for his continued support in this clinical trial. Omar Peters 05/24/16 - 11:00 am

## 2016-05-24 NOTE — Telephone Encounter (Signed)
Gave and printed appt sched and avs for pt for June and July  °

## 2016-06-05 ENCOUNTER — Other Ambulatory Visit: Payer: Self-pay | Admitting: Medical Oncology

## 2016-06-05 DIAGNOSIS — I878 Other specified disorders of veins: Secondary | ICD-10-CM

## 2016-06-05 DIAGNOSIS — C3411 Malignant neoplasm of upper lobe, right bronchus or lung: Secondary | ICD-10-CM

## 2016-06-07 ENCOUNTER — Other Ambulatory Visit (HOSPITAL_BASED_OUTPATIENT_CLINIC_OR_DEPARTMENT_OTHER): Payer: Medicare Other

## 2016-06-07 ENCOUNTER — Encounter: Payer: Self-pay | Admitting: *Deleted

## 2016-06-07 ENCOUNTER — Ambulatory Visit (HOSPITAL_BASED_OUTPATIENT_CLINIC_OR_DEPARTMENT_OTHER): Payer: Medicare Other | Admitting: Nurse Practitioner

## 2016-06-07 ENCOUNTER — Other Ambulatory Visit (HOSPITAL_COMMUNITY)
Admission: RE | Admit: 2016-06-07 | Discharge: 2016-06-07 | Disposition: A | Discharge status: Home / Self Care | Payer: Medicare Other | Source: Ambulatory Visit | Attending: Internal Medicine | Admitting: Internal Medicine

## 2016-06-07 ENCOUNTER — Encounter: Payer: Self-pay | Admitting: Nurse Practitioner

## 2016-06-07 ENCOUNTER — Ambulatory Visit (HOSPITAL_BASED_OUTPATIENT_CLINIC_OR_DEPARTMENT_OTHER): Payer: Medicare Other

## 2016-06-07 VITALS — BP 136/62 | HR 95 | Temp 98.4°F | Resp 18 | Ht 66.0 in | Wt 143.1 lb

## 2016-06-07 DIAGNOSIS — C7951 Secondary malignant neoplasm of bone: Secondary | ICD-10-CM | POA: Diagnosis not present

## 2016-06-07 DIAGNOSIS — Z79899 Other long term (current) drug therapy: Secondary | ICD-10-CM | POA: Diagnosis not present

## 2016-06-07 DIAGNOSIS — Z006 Encounter for examination for normal comparison and control in clinical research program: Secondary | ICD-10-CM

## 2016-06-07 DIAGNOSIS — Z5112 Encounter for antineoplastic immunotherapy: Secondary | ICD-10-CM

## 2016-06-07 DIAGNOSIS — C3411 Malignant neoplasm of upper lobe, right bronchus or lung: Secondary | ICD-10-CM

## 2016-06-07 LAB — COMPREHENSIVE METABOLIC PANEL
ALBUMIN: 3 g/dL — AB (ref 3.5–5.0)
ALT: 15 U/L (ref 0–55)
ANION GAP: 8 meq/L (ref 3–11)
AST: 16 U/L (ref 5–34)
Alkaline Phosphatase: 113 U/L (ref 40–150)
BUN: 9.3 mg/dL (ref 7.0–26.0)
CALCIUM: 9.5 mg/dL (ref 8.4–10.4)
CHLORIDE: 101 meq/L (ref 98–109)
CO2: 28 meq/L (ref 22–29)
Creatinine: 0.8 mg/dL (ref 0.7–1.3)
EGFR: 84 mL/min/{1.73_m2} — ABNORMAL LOW (ref >90)
Glucose: 173 mg/dl — ABNORMAL HIGH (ref 70–140)
Potassium: 3.7 mEq/L (ref 3.5–5.1)
Sodium: 137 mEq/L (ref 136–145)
Total Bilirubin: 0.39 mg/dL (ref 0.20–1.20)
Total Protein: 7.3 g/dL (ref 6.4–8.3)

## 2016-06-07 LAB — CBC WITH DIFFERENTIAL/PLATELET
BASO%: 0.9 % (ref 0.0–2.0)
BASOS ABS: 0.1 10*3/uL (ref 0.0–0.1)
EOS%: 5.4 % (ref 0.0–7.0)
Eosinophils Absolute: 0.5 10*3/uL (ref 0.0–0.5)
HEMATOCRIT: 42.3 % (ref 38.4–49.9)
HEMOGLOBIN: 14.7 g/dL (ref 13.0–17.1)
LYMPH#: 1.5 10*3/uL (ref 0.9–3.3)
LYMPH%: 16.8 % (ref 14.0–49.0)
MCH: 34.3 pg — ABNORMAL HIGH (ref 27.2–33.4)
MCHC: 34.8 g/dL (ref 32.0–36.0)
MCV: 98.6 fL — ABNORMAL HIGH (ref 79.3–98.0)
MONO#: 1.2 10*3/uL — AB (ref 0.1–0.9)
MONO%: 13.8 % (ref 0.0–14.0)
NEUT#: 5.6 10*3/uL (ref 1.5–6.5)
NEUT%: 63.1 % (ref 39.0–75.0)
PLATELETS: 233 10*3/uL (ref 140–400)
RBC: 4.29 10*6/uL (ref 4.20–5.82)
RDW: 13.3 % (ref 11.0–14.6)
WBC: 8.8 10*3/uL (ref 4.0–10.3)

## 2016-06-07 LAB — TSH: TSH: 3.333 m[IU]/L (ref 0.320–4.118)

## 2016-06-07 LAB — PHOSPHORUS: Phosphorus: 3.4 mg/dL (ref 2.5–4.6)

## 2016-06-07 LAB — LACTATE DEHYDROGENASE: LDH: 141 U/L (ref 125–245)

## 2016-06-07 LAB — MAGNESIUM: MAGNESIUM: 2 mg/dL (ref 1.5–2.5)

## 2016-06-07 MED ORDER — INV-NIVOLUMAB CHEMO INJECTION 100 MG/10 ML BMS CA 209-370
240.0000 mg | Freq: Once | INTRAVENOUS | Status: AC
  Administered 2016-06-07: 240 mg via INTRAVENOUS
  Filled 2016-06-07: qty 24

## 2016-06-07 MED ORDER — SODIUM CHLORIDE 0.9 % IV SOLN
Freq: Once | INTRAVENOUS | Status: AC
  Administered 2016-06-07: 12:00:00 via INTRAVENOUS

## 2016-06-07 NOTE — Progress Notes (Signed)
06/07/2016 1135  BMS 370 Group A, Arm B3, cycle 26, week 50 The patient comes in to the clinic accompanied by his wife.  Resting vital signs and O2 sat. were obtained along with weight and height.  The patient reports no changes since his last visit and treatment.  His weight is up a little, although he reports not having any appetite.Marland Kitchen  He denies changes in his breathing, skin rash, diarrhea, or eye problems.  He reports constipation off and on and says this has always been a problem for him.  He denies fever, pain, bleeding, or swelling.    He denies headache, confusion, blurred vision, or other neuro concerns.  He reported no changes in his current medications and has not taken any new medications.   Michel Harrow, NP saw patient and performed a targeted physical exam.  Based on lab results review and exam, the patient was cleared to continue treatment.  The patient's TSH was drawn today and was WNL, no action required. The patient and his wife were reminded to call for any problems or concerns.  They both denied having  any questions. IVRS vial assigned given to Romualdo Bolk, Pham. D and infusion instruction sheet given to [facility], RN--all questions answered and both were without further inquiries. Marcellus Scott, RN, BSN, MHA, OCN

## 2016-06-07 NOTE — Assessment & Plan Note (Addendum)
Patient returns to the clinic today for follow-up visit.  Patient states he's been doing very well recently. He states that he is drinking and eating as well as possible.  He states his activity level has decreased; but he does try to stay active on a daily basis.  He denies any recent fevers or chills.  Patient tolerated his last cycle of treatment with Nivolumab fairly well with no significant adverse effects.  He denied having any recent nausea, vomiting, diarrhea, or constipation.  He also denies any significant skin rash.  He denies any weight loss or night sweats.  He has had no chest pain, shortness breath, cough or hemoptysis.   All labs were within normal limits today.  Vital signs were stable and patient was afebrile.  ECOG performance status: 1-symptomatic, but completely ambulatory.  Patient will proceed today with cycle 26 of his Nivolumab treatment.  Patient is scheduled to have a Port-A-Cath inserted on 06/13/2016.  He will return on 06/21/2016 for labs, flush, visit, and his next cycle of Nivolumab.

## 2016-06-07 NOTE — Patient Instructions (Signed)
[facility] Discharge Instructions for Patients Receiving Chemotherapy  Today you received the following chemotherapy agents:  Nivolumab.  To help prevent nausea and vomiting after your treatment, we encourage you to take your nausea medication as directed.   If you develop nausea and vomiting that is not controlled by your nausea medication, call the clinic.   BELOW ARE SYMPTOMS THAT SHOULD BE REPORTED IMMEDIATELY:  *FEVER GREATER THAN 100.5 F  *CHILLS WITH OR WITHOUT FEVER  NAUSEA AND VOMITING THAT IS NOT CONTROLLED WITH YOUR NAUSEA MEDICATION  *UNUSUAL SHORTNESS OF BREATH  *UNUSUAL BRUISING OR BLEEDING  TENDERNESS IN MOUTH AND THROAT WITH OR WITHOUT PRESENCE OF ULCERS  *URINARY PROBLEMS  *BOWEL PROBLEMS  UNUSUAL RASH Items with * indicate a potential emergency and should be followed up as soon as possible.  Feel free to call the clinic you have any questions or concerns. The clinic phone number is [phone].  Please show the CHEMO ALERT CARD at check-in to the Emergency Department and triage nurse.   

## 2016-06-08 ENCOUNTER — Telehealth: Payer: Self-pay | Admitting: Internal Medicine

## 2016-06-08 NOTE — Progress Notes (Signed)
Established Visit Progress Note   Chief Complaint: Establish patient visit  HPI:  Omar Peters 81 y.o. male diagnosed with non-small cell lung cancer; as well as bone metastasis.  Patient is currently participating in the BMS Checkmate 37 the clinical trial; and has been randomized to group a, arm B-3 to receive Nivolumab 240 mg given every 2 weeks.  He presents to the [facility] today to receive cycle 26 of his treatment.   Patient returns to the clinic today for follow-up visit.  Patient states he's been doing very well recently. He states that he is drinking and eating as well as possible.  He states his activity level has decreased; but he does try to stay active on a daily basis.  He denies any recent fevers or chills.  Patient tolerated his last cycle of treatment with Nivolumab fairly well with no significant adverse effects.  He denied having any recent nausea, vomiting, diarrhea, or constipation.  He also denies any significant skin rash.  He denies any weight loss or night sweats.  He has had no chest pain, shortness breath, cough or hemoptysis.   Oncology History   Patient presented with cough and mild SOB.  Work up CXR abnormal.  Cancer of upper lobe of right lung   Staging form: Lung, AJCC 7th Edition     Clinical stage from 12/31/2014: Stage IV (T3, N3, M1b) - Signed by Curt Bears, MD on 01/12/2015       Staging comments: Adenocarcinoma           Cancer of upper lobe of right lung ([place])   12/16/2014 Imaging CT Chest IMPRESSION: 1. Findings, as above, highly concerning for primary right upper lobe bronchogenic carcinoma with right hilar and bilateral mediastinal lymphadenopathy, as well as a probable malignant right-sided pleural effusion.    12/29/2014 Imaging PET scan IMPRESSION: 1. Hypermetabolic right upper lobe mass consistent with primary bronchogenic carcinoma. 2. Hypermetabolic mediastinal nodal metastasis. 3. Large right pleural effusion. 4.  Hypermetabolic skeletal metastasis involving the right    12/31/2014 Initial Diagnosis Cancer of upper lobe of right lung   01/01/2015 Procedure IMPRESSION: Successful ultrasound-guided right sided thoracentesis yielding 900 ml of pleural fluid.   01/07/2015 Pathology Results Bone, biopsy, Right iliac - METASTATIC ADENOCARCINOMA   01/07/2015 Procedure IMPRESSION: Successful CT-guided core biopsy of a right iliac bone lesion.   01/11/2015 Imaging MRI Brain with and without contrast IMPRESSION: 1. No evidence of intracranial metastases. 2. Mild-to-moderate chronic small vessel ischemic disease   01/15/2015 Surgery SURGICAL PROCEDURE: Placement of right PleurX catheter with ultrasound and fluro guidance.   01/20/2015 -  Chemotherapy 1st chemotherapy carboplatin and alimta    Review of Systems  All other systems reviewed and are negative.   Past Medical History  Diagnosis Date  . Hypertension   . GERD (gastroesophageal reflux disease)   . Shortness of breath dyspnea     01/14/15 at night and has to sit up  . Anxiety     due to diagnosis of Stage 4 Lung Cancer  . Macular degeneration of right eye   . Diabetes mellitus without complication ([place])   . Lung cancer ([place])     stage IV non small cell lung cancer dx. January 2016   . Bone cancer ([place])     non small cell lung with mets to bone    Past Surgical History  Procedure Laterality Date  . Vein ligation and stripping  1977  . Chest tube insertion Right 01/15/2015  Procedure: INSERTION PLEURAL DRAINAGE CATHETER WITH ULTRASOUND AND FLURO GUIDANCE;  Surgeon: Grace Isaac, MD;  Location: [facility];  Service: Thoracic;  Laterality: Right;  . Removal of pleural drainage catheter Right 03/26/2015    Procedure: REMOVAL OF PLEURAL DRAINAGE CATHETER;  Surgeon: Grace Isaac, MD;  Location: [facility];  Service: Thoracic;  Laterality: Right;    has Cavitating mass in right upper lung lobe; Cough; Shortness of breath; Bone metastasis ([place]); Cancer of  upper lobe of right lung ([place]); Hypertension; Hyperglycemia; Anorexia; Nausea with vomiting; Constipation; Pleural effusion; Fall; Dehydration; Weakness; Hypoalbuminemia; Weight loss; Altered level of consciousness; Orthostatic hypotension; Antineoplastic chemotherapy induced anemia; Encounter for antineoplastic immunotherapy; Hypokalemia; Aphasia; and Acute encephalopathy on his problem list.    is allergic to amitriptyline and marinol.    Medication List       This list is accurate as of: 06/07/16 11:59 PM.  Always use your most recent med list.               ALEVE PO  Take 1 tablet by mouth daily as needed. Reported on 04/11/2016     amitriptyline 10 MG tablet  Commonly known as:  ELAVIL  Take 1 tablet (10 mg total) by mouth at bedtime.     amLODipine 10 MG tablet  Commonly known as:  NORVASC  Take 10 mg by mouth daily.     aspirin 81 MG tablet  Take 81 mg by mouth daily.     B-COMPLEX PO  Take 1 each by mouth daily. Reported on 03/01/2016     HYDROcodone-acetaminophen 5-325 MG tablet  Commonly known as:  NORCO  Take 1 tablet by mouth every 6 (six) hours as needed for moderate pain.     losartan-hydrochlorothiazide 100-25 MG tablet  Commonly known as:  HYZAAR     metFORMIN 500 MG tablet  Commonly known as:  GLUCOPHAGE  Take 500 mg by mouth daily.     prochlorperazine 10 MG tablet  Commonly known as:  COMPAZINE  Take 1 tablet (10 mg total) by mouth every 6 (six) hours as needed for nausea or vomiting.     ranitidine 150 MG tablet  Commonly known as:  ZANTAC  Take 150 mg by mouth daily.     SENNA S 8.6-50 MG tablet  Generic drug:  senna-docusate  Take 2 tablets by mouth daily.         PHYSICAL EXAMINATION  Oncology Vitals 06/07/2016 05/24/2016  Height 168 cm 168 cm  Weight 64.91 kg 64.411 kg  Weight (lbs) 143 lbs 2 oz 142 lbs  BMI (kg/m2) 23.1 kg/m2 22.92 kg/m2  Temp 98.4 97.6  Pulse 95 97  Resp 18 17  SpO2 98 98  BSA (m2) 1.74 m2 1.73 m2   BP Readings  from Last 2 Encounters:  06/07/16 136/62  05/24/16 144/63    Physical Exam  Constitutional: He is oriented to person, place, and time and well-developed, well-nourished, and in no distress.  HENT:  Head: Normocephalic and atraumatic.  Mouth/Throat: Oropharynx is clear and moist.  Eyes: Conjunctivae and EOM are normal. Pupils are equal, round, and reactive to light. Right eye exhibits no discharge. Left eye exhibits no discharge. No scleral icterus.  Neck: Normal range of motion. Neck supple. No JVD present. No tracheal deviation present. No thyromegaly present.  Cardiovascular: Normal rate, regular rhythm, normal heart sounds and intact distal pulses.   Pulmonary/Chest: Effort normal and breath sounds normal. No respiratory distress. He has no wheezes. He has  no rales. He exhibits no tenderness.  Abdominal: Soft. Bowel sounds are normal. He exhibits no distension and no mass. There is no tenderness. There is no rebound and no guarding.  Musculoskeletal: Normal range of motion. He exhibits no edema or tenderness.  Lymphadenopathy:    He has no cervical adenopathy.  Neurological: He is alert and oriented to person, place, and time. Gait normal.  Skin: Skin is warm and dry. No rash noted. No erythema. No pallor.  Psychiatric: Affect normal.  Nursing note and vitals reviewed.   LABORATORY DATA:. Appointment on 06/07/2016  Component Date Value Ref Range Status  . WBC 06/07/2016 8.8  4.0 - 10.3 10e3/uL Final  . NEUT# 06/07/2016 5.6  1.5 - 6.5 10e3/uL Final  . HGB 06/07/2016 14.7  13.0 - 17.1 g/dL Final  . HCT 06/07/2016 42.3  38.4 - 49.9 % Final  . Platelets 06/07/2016 233  140 - 400 10e3/uL Final  . MCV 06/07/2016 98.6* 79.3 - 98.0 fL Final  . MCH 06/07/2016 34.3* 27.2 - 33.4 pg Final  . MCHC 06/07/2016 34.8  32.0 - 36.0 g/dL Final  . RBC 06/07/2016 4.29  4.20 - 5.82 10e6/uL Final  . RDW 06/07/2016 13.3  11.0 - 14.6 % Final  . lymph# 06/07/2016 1.5  0.9 - 3.3 10e3/uL Final  . MONO#  06/07/2016 1.2* 0.1 - 0.9 10e3/uL Final  . Eosinophils Absolute 06/07/2016 0.5  0.0 - 0.5 10e3/uL Final  . Basophils Absolute 06/07/2016 0.1  0.0 - 0.1 10e3/uL Final  . NEUT% 06/07/2016 63.1  39.0 - 75.0 % Final  . LYMPH% 06/07/2016 16.8  14.0 - 49.0 % Final  . MONO% 06/07/2016 13.8  0.0 - 14.0 % Final  . EOS% 06/07/2016 5.4  0.0 - 7.0 % Final  . BASO% 06/07/2016 0.9  0.0 - 2.0 % Final  . Sodium 06/07/2016 137  136 - 145 mEq/L Final  . Potassium 06/07/2016 3.7  3.5 - 5.1 mEq/L Final  . Chloride 06/07/2016 101  98 - 109 mEq/L Final  . CO2 06/07/2016 28  22 - 29 mEq/L Final  . Glucose 06/07/2016 173* 70 - 140 mg/dl Final   Glucose reference range is for nonfasting patients. Fasting glucose reference range is 70- 100.  Marland Kitchen BUN 06/07/2016 9.3  7.0 - 26.0 mg/dL Final  . Creatinine 06/07/2016 0.8  0.7 - 1.3 mg/dL Final  . Total Bilirubin 06/07/2016 0.39  0.20 - 1.20 mg/dL Final  . Alkaline Phosphatase 06/07/2016 113  40 - 150 U/L Final  . AST 06/07/2016 16  5 - 34 U/L Final  . ALT 06/07/2016 15  0 - 55 U/L Final  . Total Protein 06/07/2016 7.3  6.4 - 8.3 g/dL Final  . Albumin 06/07/2016 3.0* 3.5 - 5.0 g/dL Final  . Calcium 06/07/2016 9.5  8.4 - 10.4 mg/dL Final  . Anion Gap 06/07/2016 8  3 - 11 mEq/L Final  . EGFR 06/07/2016 84* >90 ml/min/1.73 m2 Final   eGFR is calculated using the CKD-EPI Creatinine Equation (2009)  . Magnesium 06/07/2016 2.0  1.5 - 2.5 mg/dl Final  . LDH 06/07/2016 141  125 - 245 U/L Final  . TSH 06/07/2016 3.333  0.320 - 4.118 m(IU)/L Final  Hospital Outpatient Visit on 06/07/2016  Component Date Value Ref Range Status  . Phosphorus 06/07/2016 3.4  2.5 - 4.6 mg/dL Final    RADIOGRAPHIC STUDIES: No results found.  ASSESSMENT/PLAN:    Cancer of upper lobe of right lung [facility]) Patient returns to the clinic  today for follow-up visit.  Patient states he's been doing very well recently. He states that he is drinking and eating as well as possible.  He states his activity  level has decreased; but he does try to stay active on a daily basis.  He denies any recent fevers or chills.  Patient tolerated his last cycle of treatment with Nivolumab fairly well with no significant adverse effects.  He denied having any recent nausea, vomiting, diarrhea, or constipation.  He also denies any significant skin rash.  He denies any weight loss or night sweats.  He has had no chest pain, shortness breath, cough or hemoptysis.   All labs were within normal limits today.  Vital signs were stable and patient was afebrile.  ECOG performance status: 1-symptomatic, but completely ambulatory.  Patient will proceed today with cycle 26 of his Nivolumab treatment.  Patient is scheduled to have a Port-A-Cath inserted on 06/13/2016.  He will return on 06/21/2016 for labs, flush, visit, and his next cycle of Nivolumab.      Patient stated understanding of all instructions; and was in agreement with this plan of care. The patient knows to call the clinic with any problems, questions or concerns.   Total time spent with patient was 25 minutes;  with greater than 75 percent of that time spent in face to face counseling regarding patient's symptoms,  and coordination of care and follow up.  Disclaimer:This dictation was prepared with Dragon/digital dictation along with [employer]. Any transcriptional errors that result from this process are unintentional.  Drue Second, NP 06/08/2016

## 2016-06-08 NOTE — Telephone Encounter (Signed)
left msg confirming added flushes, pt gets por on 6/27 flushes added per pof

## 2016-06-12 ENCOUNTER — Other Ambulatory Visit: Payer: Self-pay | Admitting: Radiology

## 2016-06-13 ENCOUNTER — Other Ambulatory Visit: Payer: Self-pay | Admitting: Internal Medicine

## 2016-06-13 ENCOUNTER — Ambulatory Visit (HOSPITAL_COMMUNITY)
Admission: RE | Admit: 2016-06-13 | Discharge: 2016-06-13 | Disposition: A | Discharge status: Home / Self Care | Payer: Medicare Other | Source: Ambulatory Visit | Attending: Internal Medicine | Admitting: Internal Medicine

## 2016-06-13 ENCOUNTER — Encounter (HOSPITAL_COMMUNITY): Payer: Self-pay

## 2016-06-13 DIAGNOSIS — Z7982 Long term (current) use of aspirin: Secondary | ICD-10-CM | POA: Diagnosis not present

## 2016-06-13 DIAGNOSIS — C3491 Malignant neoplasm of unspecified part of right bronchus or lung: Secondary | ICD-10-CM | POA: Insufficient documentation

## 2016-06-13 DIAGNOSIS — Z9221 Personal history of antineoplastic chemotherapy: Secondary | ICD-10-CM | POA: Insufficient documentation

## 2016-06-13 DIAGNOSIS — I878 Other specified disorders of veins: Secondary | ICD-10-CM

## 2016-06-13 DIAGNOSIS — Z7984 Long term (current) use of oral hypoglycemic drugs: Secondary | ICD-10-CM | POA: Diagnosis not present

## 2016-06-13 DIAGNOSIS — C3490 Malignant neoplasm of unspecified part of unspecified bronchus or lung: Secondary | ICD-10-CM | POA: Diagnosis present

## 2016-06-13 LAB — CBC WITH DIFFERENTIAL/PLATELET
BASOS PCT: 1 %
Basophils Absolute: 0.1 10*3/uL (ref 0.0–0.1)
EOS ABS: 0.8 10*3/uL — AB (ref 0.0–0.7)
Eosinophils Relative: 7 %
HCT: 44.8 % (ref 39.0–52.0)
HEMOGLOBIN: 15.5 g/dL (ref 13.0–17.0)
Lymphocytes Relative: 20 %
Lymphs Abs: 2.1 10*3/uL (ref 0.7–4.0)
MCH: 33.9 pg (ref 26.0–34.0)
MCHC: 34.6 g/dL (ref 30.0–36.0)
MCV: 98 fL (ref 78.0–100.0)
Monocytes Absolute: 1.3 10*3/uL — ABNORMAL HIGH (ref 0.1–1.0)
Monocytes Relative: 12 %
NEUTROS PCT: 60 %
Neutro Abs: 6.6 10*3/uL (ref 1.7–7.7)
Platelets: 279 10*3/uL (ref 150–400)
RBC: 4.57 MIL/uL (ref 4.22–5.81)
RDW: 13.1 % (ref 11.5–15.5)
WBC: 10.8 10*3/uL — AB (ref 4.0–10.5)

## 2016-06-13 LAB — GLUCOSE, CAPILLARY: Glucose-Capillary: 92 mg/dL (ref 65–99)

## 2016-06-13 LAB — PROTIME-INR
INR: 1.17 (ref 0.00–1.49)
PROTHROMBIN TIME: 14.6 s (ref 11.6–15.2)

## 2016-06-13 MED ORDER — FENTANYL CITRATE (PF) 100 MCG/2ML IJ SOLN
INTRAMUSCULAR | Status: AC
  Filled 2016-06-13: qty 4

## 2016-06-13 MED ORDER — LIDOCAINE HCL 1 % IJ SOLN
INTRAMUSCULAR | Status: DC
Start: 2016-06-13 — End: 2016-06-14
  Filled 2016-06-13: qty 20

## 2016-06-13 MED ORDER — CEFAZOLIN SODIUM-DEXTROSE 2-4 GM/100ML-% IV SOLN
2.0000 g | INTRAVENOUS | Status: AC
  Administered 2016-06-13: 2 g via INTRAVENOUS
  Filled 2016-06-13: qty 100

## 2016-06-13 MED ORDER — MIDAZOLAM HCL 2 MG/2ML IJ SOLN
INTRAMUSCULAR | Status: AC
  Filled 2016-06-13: qty 6

## 2016-06-13 MED ORDER — HEPARIN SOD (PORK) LOCK FLUSH 100 UNIT/ML IV SOLN
INTRAVENOUS | Status: AC
  Filled 2016-06-13: qty 5

## 2016-06-13 MED ORDER — IOPAMIDOL (ISOVUE-300) INJECTION 61%
50.0000 mL | Freq: Once | INTRAVENOUS | Status: AC | PRN
  Administered 2016-06-13: 10 mL via INTRAVENOUS

## 2016-06-13 MED ORDER — HEPARIN SOD (PORK) LOCK FLUSH 100 UNIT/ML IV SOLN
INTRAVENOUS | Status: AC | PRN
  Administered 2016-06-13: 500 [IU]

## 2016-06-13 MED ORDER — FENTANYL CITRATE (PF) 100 MCG/2ML IJ SOLN
INTRAMUSCULAR | Status: AC | PRN
  Administered 2016-06-13: 50 ug via INTRAVENOUS

## 2016-06-13 MED ORDER — MIDAZOLAM HCL 2 MG/2ML IJ SOLN
INTRAMUSCULAR | Status: AC | PRN
  Administered 2016-06-13 (×2): 0.5 mg via INTRAVENOUS
  Administered 2016-06-13: 1 mg via INTRAVENOUS

## 2016-06-13 MED ORDER — SODIUM CHLORIDE 0.9 % IV SOLN: INTRAVENOUS | Status: DC

## 2016-06-13 NOTE — Discharge Instructions (Signed)

## 2016-06-13 NOTE — Procedures (Signed)
Interventional Radiology Procedure Note  Procedure: Placement of a right IJ approach single lumen PowerPort.  Tip is positioned at the superior cavoatrial junction and catheter is ready for immediate use.  Complications: No immediate Recommendations:  - Ok to shower tomorrow - Do not submerge for 7 days - Routine line care   Signed,  Yandel Zeiner K. Amylia Collazos, MD   

## 2016-06-13 NOTE — Progress Notes (Signed)
Patient ID: Omar Peters, male   DOB: 04/11/1935, 81 y.o.   MRN: [id number]    Referring Physician(s): Mohamed,Mohamed  Supervising Physician: Jacqulynn Cadet  Patient Status:  Outpatient  Chief Complaint:  "I'm here for a port a cath"  Subjective: Pt familiar to IR service from prior thoracentesis as well as iliac bone lesion biopsy in 2016. He has a history of adenocarcinoma of the right lung (stage IV) initially diagnosed in 2016 status post chemoradiation. He currently has poor venous access and request is now made for Port-A-Cath placement for additional chemotherapy. He currently denies fever, headache, chest pain, dyspnea, abdominal/back pain, nausea, vomiting or abnormal bleeding. He does have occasional cough. He continues to smoke.   Allergies: Amitriptyline and Marinol  Medications: Prior to Admission medications   Medication Sig Start Date End Date Taking? Authorizing Provider  amitriptyline (ELAVIL) 10 MG tablet Take 1 tablet (10 mg total) by mouth at bedtime. 05/10/16   Owens Shark, NP  amLODipine (NORVASC) 10 MG tablet Take 10 mg by mouth daily.    Historical Provider, MD  aspirin 81 MG tablet Take 81 mg by mouth daily.    Historical Provider, MD  B Complex-Biotin-FA (B-COMPLEX PO) Take 1 each by mouth daily. Reported on 03/01/2016    Historical Provider, MD  HYDROcodone-acetaminophen (NORCO) 5-325 MG per tablet Take 1 tablet by mouth every 6 (six) hours as needed for moderate pain. Patient not taking: Reported on 05/10/2016 01/15/15   Grace Isaac, MD  losartan-hydrochlorothiazide [facility]) 100-25 MG tablet  05/12/16   Historical Provider, MD  metFORMIN (GLUCOPHAGE) 500 MG tablet Take 500 mg by mouth daily. 12/17/15   Historical Provider, MD  Naproxen Sodium (ALEVE PO) Take 1 tablet by mouth daily as needed. Reported on 04/11/2016    Historical Provider, MD  prochlorperazine (COMPAZINE) 10 MG tablet Take 1 tablet (10 mg total) by mouth every 6 (six) hours as  needed for nausea or vomiting. 05/16/16   Curt Bears, MD  ranitidine (ZANTAC) 150 MG tablet Take 150 mg by mouth daily.    Historical Provider, MD  senna-docusate (SENNA S) 8.6-50 MG per tablet Take 2 tablets by mouth daily.    Historical Provider, MD     Vital Signs: BP 131/64 mmHg  Pulse 107  Temp(Src) 98 F (36.7 C) (Oral)  Resp 18  SpO2 97%  Physical Exam patient awake, alert. Chest with diminished breath sounds right base, left clear. Heart slightly tachycardic but regular rhythm. abd soft,+ bowel sounds, nontender. Lower extremities -no significant edema.  Imaging: No results found.  Labs:  CBC:  Recent Labs  05/10/16 1057 05/24/16 1007 06/07/16 0948 06/13/16 1300  WBC 7.5 9.0 8.8 10.8*  HGB 14.3 14.6 14.7 15.5  HCT 42.4 43.0 42.3 44.8  PLT 215 234 233 279    COAGS:  Recent Labs  12/01/15 1601 06/13/16 1300  INR 1.18 1.17  APTT 38*  --     BMP:  Recent Labs  12/01/15 1601 12/01/15 1618 12/02/15 0400  04/26/16 1126 05/10/16 1057 05/24/16 1008 06/07/16 0948  NA 137 138 140  < > 138 139 138 137  K 3.3* 3.3* 3.4*  < > 3.7 3.6 3.9 3.7  CL 98* 96* 105  --   --   --   --   --   CO2 32  --  28  < > 30* '28 28 28  '$ GLUCOSE 123* 118* 120*  < > 183* 184* 143* 173*  BUN '9 8 8  '$ < >  8.9 11.6 9.4 9.3  CALCIUM 9.2  --  8.8*  < > 9.5 9.7 9.6 9.5  CREATININE 0.92 0.80 0.76  < > 0.9 0.8 0.8 0.8  GFRNONAA >60  --  >60  --   --   --   --   --   GFRAA >60  --  >60  --   --   --   --   --   < > = values in this interval not displayed.  LIVER FUNCTION TESTS:  Recent Labs  04/26/16 1126 05/10/16 1057 05/24/16 1008 06/07/16 0948  BILITOT 0.54 0.43 0.35 0.39  AST '20 17 17 16  '$ ALT '21 18 14 15  '$ ALKPHOS 106 111 105 113  PROT 7.2 7.3 7.4 7.3  ALBUMIN 3.1* 3.1* 3.0* 3.0*    Assessment and Plan: Patient with history of stage IV adenocarcinoma right lung initially diagnosed in 2016, poor venous access. He presents today for Port-A-Cath placement for  additional chemotherapy.Risks and benefits discussed with the patient/wife including, but not limited to bleeding, infection, pneumothorax, or fibrin sheath development and need for additional procedures.All of the patient's questions were answered, patient is agreeable to proceed.Consent signed and in chart.     Electronically Signed: D. Rowe Robert 06/13/2016, 2:11 PM   I spent a total of 20 minutes at the the patient's bedside AND on the patient's hospital floor or unit, greater than 50% of which was counseling/coordinating care for port a cath placement

## 2016-06-19 ENCOUNTER — Other Ambulatory Visit: Payer: Self-pay | Admitting: Medical Oncology

## 2016-06-21 ENCOUNTER — Ambulatory Visit (HOSPITAL_BASED_OUTPATIENT_CLINIC_OR_DEPARTMENT_OTHER): Payer: Medicare Other

## 2016-06-21 ENCOUNTER — Ambulatory Visit: Payer: Medicare Other

## 2016-06-21 ENCOUNTER — Other Ambulatory Visit (HOSPITAL_COMMUNITY)
Admission: RE | Admit: 2016-06-21 | Discharge: 2016-06-21 | Disposition: A | Discharge status: Home / Self Care | Payer: Medicare Other | Source: Ambulatory Visit | Attending: Internal Medicine | Admitting: Internal Medicine

## 2016-06-21 ENCOUNTER — Other Ambulatory Visit (HOSPITAL_BASED_OUTPATIENT_CLINIC_OR_DEPARTMENT_OTHER): Payer: Medicare Other

## 2016-06-21 ENCOUNTER — Other Ambulatory Visit: Payer: Medicare Other

## 2016-06-21 ENCOUNTER — Other Ambulatory Visit: Payer: Self-pay | Admitting: *Deleted

## 2016-06-21 ENCOUNTER — Encounter: Payer: Self-pay | Admitting: Medical Oncology

## 2016-06-21 ENCOUNTER — Ambulatory Visit (HOSPITAL_BASED_OUTPATIENT_CLINIC_OR_DEPARTMENT_OTHER): Payer: Medicare Other | Admitting: Internal Medicine

## 2016-06-21 ENCOUNTER — Encounter: Payer: Self-pay | Admitting: Internal Medicine

## 2016-06-21 VITALS — BP 150/65 | HR 102 | Temp 97.8°F | Resp 18 | Ht 66.0 in | Wt 140.5 lb

## 2016-06-21 DIAGNOSIS — C3411 Malignant neoplasm of upper lobe, right bronchus or lung: Secondary | ICD-10-CM

## 2016-06-21 DIAGNOSIS — Z006 Encounter for examination for normal comparison and control in clinical research program: Secondary | ICD-10-CM

## 2016-06-21 DIAGNOSIS — Z5112 Encounter for antineoplastic immunotherapy: Secondary | ICD-10-CM

## 2016-06-21 DIAGNOSIS — C7951 Secondary malignant neoplasm of bone: Secondary | ICD-10-CM | POA: Diagnosis not present

## 2016-06-21 DIAGNOSIS — Z95828 Presence of other vascular implants and grafts: Secondary | ICD-10-CM

## 2016-06-21 LAB — COMPREHENSIVE METABOLIC PANEL
ALBUMIN: 2.9 g/dL — AB (ref 3.5–5.0)
ALK PHOS: 119 U/L (ref 40–150)
ALT: 14 U/L (ref 0–55)
ANION GAP: 10 meq/L (ref 3–11)
AST: 17 U/L (ref 5–34)
BUN: 10 mg/dL (ref 7.0–26.0)
CALCIUM: 9.7 mg/dL (ref 8.4–10.4)
CO2: 29 mEq/L (ref 22–29)
Chloride: 100 mEq/L (ref 98–109)
Creatinine: 0.9 mg/dL (ref 0.7–1.3)
EGFR: 81 mL/min/{1.73_m2} — AB (ref >90)
Glucose: 201 mg/dl — ABNORMAL HIGH (ref 70–140)
POTASSIUM: 4 meq/L (ref 3.5–5.1)
Sodium: 139 mEq/L (ref 136–145)
Total Bilirubin: 0.3 mg/dL (ref 0.20–1.20)
Total Protein: 7.2 g/dL (ref 6.4–8.3)

## 2016-06-21 LAB — CBC WITH DIFFERENTIAL/PLATELET
BASO%: 1.1 % (ref 0.0–2.0)
BASOS ABS: 0.1 10*3/uL (ref 0.0–0.1)
EOS%: 6.9 % (ref 0.0–7.0)
Eosinophils Absolute: 0.6 10*3/uL — ABNORMAL HIGH (ref 0.0–0.5)
HEMATOCRIT: 44.3 % (ref 38.4–49.9)
HEMOGLOBIN: 14.8 g/dL (ref 13.0–17.1)
LYMPH#: 1.4 10*3/uL (ref 0.9–3.3)
LYMPH%: 15.5 % (ref 14.0–49.0)
MCH: 33.2 pg (ref 27.2–33.4)
MCHC: 33.4 g/dL (ref 32.0–36.0)
MCV: 99.3 fL — ABNORMAL HIGH (ref 79.3–98.0)
MONO#: 1 10*3/uL — AB (ref 0.1–0.9)
MONO%: 11.4 % (ref 0.0–14.0)
NEUT#: 5.7 10*3/uL (ref 1.5–6.5)
NEUT%: 65.1 % (ref 39.0–75.0)
Platelets: 270 10*3/uL (ref 140–400)
RBC: 4.46 10*6/uL (ref 4.20–5.82)
RDW: 12.9 % (ref 11.0–14.6)
WBC: 8.8 10*3/uL (ref 4.0–10.3)

## 2016-06-21 LAB — PHOSPHORUS: PHOSPHORUS: 3.1 mg/dL (ref 2.5–4.6)

## 2016-06-21 LAB — LACTATE DEHYDROGENASE: LDH: 141 U/L (ref 125–245)

## 2016-06-21 LAB — MAGNESIUM: MAGNESIUM: 2.1 mg/dL (ref 1.5–2.5)

## 2016-06-21 MED ORDER — SODIUM CHLORIDE 0.9 % IV SOLN
Freq: Once | INTRAVENOUS | Status: AC
  Administered 2016-06-21: 13:00:00 via INTRAVENOUS

## 2016-06-21 MED ORDER — LIDOCAINE-PRILOCAINE 2.5-2.5 % EX CREA: 1.0000 "application " | TOPICAL_CREAM | CUTANEOUS | Status: AC | PRN

## 2016-06-21 MED ORDER — HEPARIN SOD (PORK) LOCK FLUSH 100 UNIT/ML IV SOLN
500.0000 [IU] | Freq: Once | INTRAVENOUS | Status: AC | PRN
  Administered 2016-06-21: 500 [IU]
  Filled 2016-06-21: qty 5

## 2016-06-21 MED ORDER — SODIUM CHLORIDE 0.9 % IJ SOLN
10.0000 mL | INTRAMUSCULAR | Status: DC | PRN
  Administered 2016-06-21: 10 mL
  Filled 2016-06-21: qty 10

## 2016-06-21 MED ORDER — SODIUM CHLORIDE 0.9 % IV SOLN
240.0000 mg | Freq: Once | INTRAVENOUS | Status: AC
  Administered 2016-06-21: 240 mg via INTRAVENOUS
  Filled 2016-06-21: qty 24

## 2016-06-21 MED ORDER — SODIUM CHLORIDE 0.9% FLUSH
10.0000 mL | INTRAVENOUS | Status: DC | PRN
Start: 2016-06-21 — End: 2016-06-21
  Filled 2016-06-21: qty 10

## 2016-06-21 NOTE — Patient Instructions (Signed)

## 2016-06-21 NOTE — Patient Instructions (Signed)
[facility] Discharge Instructions for Patients Receiving Chemotherapy  Today you received the following chemotherapy agents:  nivolumab.  To help prevent nausea and vomiting after your treatment, we encourage you to take your nausea medication as prescribed.   If you develop nausea and vomiting that is not controlled by your nausea medication, call the clinic.   BELOW ARE SYMPTOMS THAT SHOULD BE REPORTED IMMEDIATELY:  *FEVER GREATER THAN 100.5 F  *CHILLS WITH OR WITHOUT FEVER  NAUSEA AND VOMITING THAT IS NOT CONTROLLED WITH YOUR NAUSEA MEDICATION  *UNUSUAL SHORTNESS OF BREATH  *UNUSUAL BRUISING OR BLEEDING  TENDERNESS IN MOUTH AND THROAT WITH OR WITHOUT PRESENCE OF ULCERS  *URINARY PROBLEMS  *BOWEL PROBLEMS  UNUSUAL RASH Items with * indicate a potential emergency and should be followed up as soon as possible.  Feel free to call the clinic you have any questions or concerns. The clinic phone number is [phone].  Please show the CHEMO ALERT CARD at check-in to the Emergency Department and triage nurse.   

## 2016-06-21 NOTE — Progress Notes (Signed)
[place] Telephone:([phone]) [phone]   Fax:([phone]) [place], [place] [address] [place] [address]  DIAGNOSIS: Stage IV (T3, N3, M1b) non-small cell lung cancer, adenocarcinoma diagnosed in January 2016 presented with right upper lobe lung mass in addition to mediastinal lymphadenopathy and large right pleural effusion as well as bone metastasis.  PRIOR THERAPY:  1. Status post Pleurx catheter placement by Dr. Servando Snare for drainage of the right pleural effusion.  2. Systemic chemotherapy with carboplatin for AUC of 5 and Alimta 500 MG/M2 every 3 weeks. Status post 6 cycles. 3. Status post palliative radiotherapy to the metastatic bone lesion in the right glenoid as well as the right ilium area completed 02/18/2016  CURRENT THERAPY: Patient is participating in the [place] 370 clinical trial and has been randomized to group A arm B-3 to receive Nivolumab 240 mg given every 2 weeks. First cycle expected 06/10/2015. Status post 26 cycles.  INTERVAL HISTORY: Omar Peters 81 y.o. male returns to the clinic today for follow-up visit accompanied by his wife. The patient has no complaints today except for intermittent left-sided chest pain relieved with ibuprofen. He tolerated the last cycle of his treatment with Nivolumab fairly well with no significant adverse effects. He denied having any significant fever or chills. He has no nausea, vomiting, diarrhea or constipation. The patient denied having any significant skin rash. He denied having any significant weight loss or night sweats. He has no chest pain, shortness of breath, cough or hemoptysis. He is here today for cycle #27 of his treatment.  MEDICAL HISTORY: Past Medical History  Diagnosis Date  . Hypertension   . GERD (gastroesophageal reflux disease)   . Shortness of breath dyspnea     01/14/15 at night and has to sit up  . Anxiety     due to diagnosis of Stage 4 Lung Cancer    . Macular degeneration of right eye   . Diabetes mellitus without complication ([place])   . Lung cancer ([place])     stage IV non small cell lung cancer dx. January 2016   . Bone cancer ([place])     non small cell lung with mets to bone    ALLERGIES:  is allergic to amitriptyline and marinol.  MEDICATIONS:  Current Outpatient Prescriptions  Medication Sig Dispense Refill  . amitriptyline (ELAVIL) 10 MG tablet Take 1 tablet (10 mg total) by mouth at bedtime. 30 tablet 0  . amLODipine (NORVASC) 10 MG tablet Take 10 mg by mouth daily.    Marland Kitchen aspirin 81 MG tablet Take 81 mg by mouth daily.    . B Complex-Biotin-FA (B-COMPLEX PO) Take 1 each by mouth daily. Reported on 03/01/2016    . HYDROcodone-acetaminophen (NORCO) 5-325 MG per tablet Take 1 tablet by mouth every 6 (six) hours as needed for moderate pain. 30 tablet 0  . lidocaine-prilocaine (EMLA) cream Apply 1 application topically as needed. Pt is to apply cream 1 hour before receiving chemo 30 g 0  . losartan-hydrochlorothiazide (HYZAAR) 100-25 MG tablet     . metFORMIN (GLUCOPHAGE) 500 MG tablet Take 500 mg by mouth daily.    . Naproxen Sodium (ALEVE PO) Take 1 tablet by mouth daily as needed. Reported on 04/11/2016    . prochlorperazine (COMPAZINE) 10 MG tablet Take 1 tablet (10 mg total) by mouth every 6 (six) hours as needed for nausea or vomiting. 30 tablet 0  . ranitidine (ZANTAC) 150  MG tablet Take 150 mg by mouth daily.    Marland Kitchen senna-docusate (SENNA S) 8.6-50 MG per tablet Take 2 tablets by mouth daily.     No current facility-administered medications for this visit.    SURGICAL HISTORY:  Past Surgical History  Procedure Laterality Date  . Vein ligation and stripping  1977  . Chest tube insertion Right 01/15/2015    Procedure: INSERTION PLEURAL DRAINAGE CATHETER WITH ULTRASOUND AND FLURO GUIDANCE;  Surgeon: Grace Isaac, MD;  Location: [facility];  Service: Thoracic;  Laterality: Right;  . Removal of pleural drainage catheter Right  03/26/2015    Procedure: REMOVAL OF PLEURAL DRAINAGE CATHETER;  Surgeon: Grace Isaac, MD;  Location: [facility];  Service: Thoracic;  Laterality: Right;    REVIEW OF SYSTEMS:  A comprehensive review of systems was negative except for: Constitutional: positive for fatigue Respiratory: positive for pleurisy/chest pain   PHYSICAL EXAMINATION: General appearance: alert, cooperative and no distress Head: Normocephalic, without obvious abnormality, atraumatic Neck: no adenopathy, no JVD, supple, symmetrical, trachea midline and thyroid not enlarged, symmetric, no tenderness/mass/nodules Lymph nodes: Cervical, supraclavicular, and axillary nodes normal. Resp: clear to auscultation bilaterally Back: symmetric, no curvature. ROM normal. No CVA tenderness. Cardio: regular rate and rhythm, S1, S2 normal, no murmur, click, rub or gallop GI: soft, non-tender; bowel sounds normal; no masses,  no organomegaly Extremities: extremities normal, atraumatic, no cyanosis or edema Neurologic: Alert and oriented X 3, normal strength and tone. Normal symmetric reflexes. Normal coordination and gait  ECOG PERFORMANCE STATUS: 1 - Symptomatic but completely ambulatory  Blood pressure 150/65, pulse 102, temperature 97.8 F (36.6 C), temperature source Oral, resp. rate 18, height '5\' 6"'$  (1.676 m), weight 140 lb 8 oz (63.73 kg), SpO2 95 %.  LABORATORY DATA: Lab Results  Component Value Date   WBC 8.8 06/21/2016   HGB 14.8 06/21/2016   HCT 44.3 06/21/2016   MCV 99.3* 06/21/2016   PLT 270 06/21/2016      Chemistry      Component Value Date/Time   NA 139 06/21/2016 1009   NA 140 12/02/2015 0400   K 4.0 06/21/2016 1009   K 3.4* 12/02/2015 0400   CL 105 12/02/2015 0400   CO2 29 06/21/2016 1009   CO2 28 12/02/2015 0400   BUN 10.0 06/21/2016 1009   BUN 8 12/02/2015 0400   CREATININE 0.9 06/21/2016 1009   CREATININE 0.76 12/02/2015 0400   GLU 231* 03/10/2015 1539      Component Value Date/Time   CALCIUM  9.7 06/21/2016 1009   CALCIUM 8.8* 12/02/2015 0400   ALKPHOS 119 06/21/2016 1009   ALKPHOS 71 12/02/2015 0400   AST 17 06/21/2016 1009   AST 18 12/02/2015 0400   ALT 14 06/21/2016 1009   ALT 16* 12/02/2015 0400   BILITOT 0.30 06/21/2016 1009   BILITOT 0.7 12/02/2015 0400       RADIOGRAPHIC STUDIES: Ir Fluoro Guide Cv Line Right  06/13/2016  INDICATION: 81 year old male with non-small cell lung cancer in need of durable central venous access for chemotherapy. EXAM: IMPLANTED PORT A CATH PLACEMENT WITH ULTRASOUND AND FLUOROSCOPIC GUIDANCE SUPERIOR VENA CAVAGRAM MEDICATIONS: 2 g Ancef; The antibiotic was administered within an appropriate time interval prior to skin puncture. ANESTHESIA/SEDATION: Versed 2 mg IV; Fentanyl 50 mcg IV; Moderate Sedation Time:  56 minutes The patient was continuously monitored during the procedure by the interventional radiology nurse under my direct supervision. FLUOROSCOPY TIME:  Three minutes, 48 seconds (72 mGy) COMPLICATIONS: None immediate. Estimated blood  loss: 0 PROCEDURE: The right neck and chest was prepped with chlorhexidine, and draped in the usual sterile fashion using maximum barrier technique (cap and mask, sterile gown, sterile gloves, large sterile sheet, hand hygiene and cutaneous antiseptic). Antibiotic prophylaxis was provided with g Ancef administered IV one hour prior to skin incision. Local anesthesia was attained by infiltration with 1% lidocaine with epinephrine. Ultrasound demonstrated patency of the right internal jugular vein, and this was documented with an image. Under real-time ultrasound guidance, this vein was accessed with a 21 gauge micropuncture needle and image documentation was performed. A small dermatotomy was made at the access site with an 11 scalpel. A 0.018" wire was advanced into the SVC and the access needle exchanged for a 55F micropuncture vascular sheath. The 0.018" wire was then removed and a 0.035" wire advanced into the IVC.  An appropriate location for the subcutaneous reservoir was selected below the clavicle and an incision was made through the skin and underlying soft tissues. The subcutaneous tissues were then dissected using a combination of blunt and sharp surgical technique and a pocket was formed. A single lumen power injectable portacatheter was then tunneled through the subcutaneous tissues from the pocket to the dermatotomy and the port reservoir placed within the subcutaneous pocket. The venous access site was then serially dilated and a peel away vascular sheath placed over the wire. The wire was removed and the port catheter advanced into position under fluoroscopic guidance. The catheter tip is positioned in the upper right atrium. This was documented with a spot image. The portacatheter was then tested and 2 flush well pole would not aspirate. The port catheter was visualized in multiple angles fluoroscopically. No kink or tissue was identified. Therefore, a contrast injection was performed to confirm intravascular location of the catheter. The catheter is well positioned with the tip at the superior cavoatrial junction. There is no extravasation or kinking of the catheter. After additional manipulation without successful aspiration the decision was made to exchange for a new port catheter. There appears to be some malfunction with the existing port catheter tubing. Therefore, the port was disassembled and a stiff Glidewire advanced through the catheter and into the inferior vena cava. The existing catheter tubing was removed. A new peel-away sheath was advanced over the wire and into the SVC. A new port catheter tubing was advanced over the wire and positioned at the cavoatrial junction. The catheter tubing was then cut and catheter re- assembled. The port catheter was again tested and found to aspirate and flush very easily. The poor was placed back into the reservoir pocket. The port was flushed with saline followed  by 100 units/mL heparinized saline. The pocket was then closed in two layers using first subdermal inverted interrupted absorbable sutures followed by a running subcuticular suture. The epidermis was then sealed with Dermabond. The dermatotomy at the venous access site was also sealed with Dermabond. IMPRESSION: Successful placement of a right IJ approach Power Port with ultrasound and fluoroscopic guidance. The catheter is ready for use. Signed, Criselda Peaches, MD [facility] Radiology Electronically Signed   By: Jacqulynn Cadet M.D.   On: 06/13/2016 17:33   Ir US Guide Vasc Access Right  06/13/2016  INDICATION: 81 year old male with non-small cell lung cancer in need of durable central venous access for chemotherapy. EXAM: IMPLANTED PORT A CATH PLACEMENT WITH ULTRASOUND AND FLUOROSCOPIC GUIDANCE SUPERIOR VENA CAVAGRAM MEDICATIONS: 2 g Ancef; The antibiotic was administered within an appropriate time  interval prior to skin puncture. ANESTHESIA/SEDATION: Versed 2 mg IV; Fentanyl 50 mcg IV; Moderate Sedation Time:  56 minutes The patient was continuously monitored during the procedure by the interventional radiology nurse under my direct supervision. FLUOROSCOPY TIME:  Three minutes, 48 seconds (72 mGy) COMPLICATIONS: None immediate. Estimated blood loss: 0 PROCEDURE: The right neck and chest was prepped with chlorhexidine, and draped in the usual sterile fashion using maximum barrier technique (cap and mask, sterile gown, sterile gloves, large sterile sheet, hand hygiene and cutaneous antiseptic). Antibiotic prophylaxis was provided with g Ancef administered IV one hour prior to skin incision. Local anesthesia was attained by infiltration with 1% lidocaine with epinephrine. Ultrasound demonstrated patency of the right internal jugular vein, and this was documented with an image. Under real-time ultrasound guidance, this vein was accessed with a 21 gauge  micropuncture needle and image documentation was performed. A small dermatotomy was made at the access site with an 11 scalpel. A 0.018" wire was advanced into the SVC and the access needle exchanged for a 15F micropuncture vascular sheath. The 0.018" wire was then removed and a 0.035" wire advanced into the IVC. An appropriate location for the subcutaneous reservoir was selected below the clavicle and an incision was made through the skin and underlying soft tissues. The subcutaneous tissues were then dissected using a combination of blunt and sharp surgical technique and a pocket was formed. A single lumen power injectable portacatheter was then tunneled through the subcutaneous tissues from the pocket to the dermatotomy and the port reservoir placed within the subcutaneous pocket. The venous access site was then serially dilated and a peel away vascular sheath placed over the wire. The wire was removed and the port catheter advanced into position under fluoroscopic guidance. The catheter tip is positioned in the upper right atrium. This was documented with a spot image. The portacatheter was then tested and 2 flush well pole would not aspirate. The port catheter was visualized in multiple angles fluoroscopically. No kink or tissue was identified. Therefore, a contrast injection was performed to confirm intravascular location of the catheter. The catheter is well positioned with the tip at the superior cavoatrial junction. There is no extravasation or kinking of the catheter. After additional manipulation without successful aspiration the decision was made to exchange for a new port catheter. There appears to be some malfunction with the existing port catheter tubing. Therefore, the port was disassembled and a stiff Glidewire advanced through the catheter and into the inferior vena cava. The existing catheter tubing was removed. A new peel-away sheath was advanced over the wire and into the SVC. A new port catheter  tubing was advanced over the wire and positioned at the cavoatrial junction. The catheter tubing was then cut and catheter re- assembled. The port catheter was again tested and found to aspirate and flush very easily. The poor was placed back into the reservoir pocket. The port was flushed with saline followed by 100 units/mL heparinized saline. The pocket was then closed in two layers using first subdermal inverted interrupted absorbable sutures followed by a running subcuticular suture. The epidermis was then sealed with Dermabond. The dermatotomy at the venous access site was also sealed with Dermabond. IMPRESSION: Successful placement of a right IJ approach Power Port with ultrasound and fluoroscopic guidance. The catheter is ready for use. Signed, Criselda Peaches, MD [facility] Radiology Electronically Signed   By: Jacqulynn Cadet M.D.   On: 06/13/2016 17:33  ASSESSMENT AND PLAN: This is a very pleasant 81 years old white male with history of stage IV non-small cell lung cancer, adenocarcinoma status post induction chemotherapy was carboplatin and Alimta and currently undergoing treatment with immunotherapy according to the [employer] 370 clinical trial. He was randomized to the Nivolumab arm 240 mg IV every 2 weeks. He is status post 26 cycles and tolerating his treatment fairly well.  I recommended for the patient to continue his treatment with Nivolumab and he will proceed with cycle #27 today as a scheduled.  The patient would come back for follow-up visit in 2 weeks for reevaluation before starting cycle #28. He would have repeat CT scan of the chest, abdomen and pelvis performed on 07/12/2016. The patient was advised to call immediately if he has any concerning symptoms in the interval. The patient voices understanding of current disease status and treatment options and is in agreement with the current care plan.  All questions were  answered. The patient knows to call the clinic with any problems, questions or concerns. We can certainly see the patient much sooner if necessary.  Disclaimer: This note was dictated with voice recognition software. Similar sounding words can inadvertently be transcribed and may not be corrected upon review.

## 2016-06-21 NOTE — Progress Notes (Signed)
Patient states, "I want them to use my arm to draw my blood." All labs drawn by Lenna Sciara, Phlebotomist today.

## 2016-06-21 NOTE — Progress Notes (Signed)
BMS370, Group A, Arm B3: cycle 27/week 52 Patient here today for cycle 27. I met with patient and spouse after patient's lab appointment and prior to V/S assessment. Patient had PAC placed on 06/13/16 and patient reports no issues. Reviewed with patient any new issues or concerns since last visit. Patient reports today that he has had left, side, intermittent chest pain. Patient reports this has been on-going approximately "3-4 weeks", at today's appt was the first mention of it. Patient states the pain isn't consistent and he experiences it mostly when he is sedentary and reports pain today at a 2 on pain scale with 10 being the worst. Patient states he takes Aleve as needed for it and this helps. Patient denies any other new or worsening symptoms including SOB, fatigue, and appetite concerns. Patient denies: skin problems, neuro problems, bowel or bladder concerns. Patient continues with lack of appetite and has dropped 2 lbs since his last appointment today at 63.73kg, baseline weight was 65 kg. Patient will be going to beach for vacation next week and was educated on using adequate sun protection, clothing and high SPF sunscreen. Patient and spouse gave verbal understanding. Per MD's physical assessment of patient and review of all labs including NCS labs, patient to continue with cycle 27 of Nivolumab today. Patient informed that next CT scan to be scheduled for 07/12/16. All patient's and spouse's questions answered to their satisfaction, and they deny further questions at this time. Patient encouraged to contact Dr. Julien Nordmann or myself with any questions or concerns they may have. Study dispensed vial assignment provided to PharmD Lattie Haw Halcomb.  Infusion instruction and treatment sign provided to infusion nurse, Elmyra Ricks, RN Patient's Cycle 28 appt scheduled 7/19 Adele Dan, RN, BSN Clinical Research 06/21/2016 2:28 PM

## 2016-07-03 ENCOUNTER — Other Ambulatory Visit: Payer: Self-pay | Admitting: Medical Oncology

## 2016-07-03 DIAGNOSIS — Z79899 Other long term (current) drug therapy: Secondary | ICD-10-CM

## 2016-07-03 DIAGNOSIS — C3411 Malignant neoplasm of upper lobe, right bronchus or lung: Secondary | ICD-10-CM

## 2016-07-05 ENCOUNTER — Other Ambulatory Visit: Payer: Medicare Other

## 2016-07-05 ENCOUNTER — Ambulatory Visit (HOSPITAL_BASED_OUTPATIENT_CLINIC_OR_DEPARTMENT_OTHER): Payer: Medicare Other

## 2016-07-05 ENCOUNTER — Encounter: Payer: Self-pay | Admitting: Medical Oncology

## 2016-07-05 ENCOUNTER — Ambulatory Visit (HOSPITAL_BASED_OUTPATIENT_CLINIC_OR_DEPARTMENT_OTHER): Payer: Medicare Other | Admitting: Internal Medicine

## 2016-07-05 ENCOUNTER — Encounter: Payer: Self-pay | Admitting: Internal Medicine

## 2016-07-05 ENCOUNTER — Ambulatory Visit: Payer: Medicare Other

## 2016-07-05 ENCOUNTER — Telehealth: Payer: Self-pay | Admitting: Internal Medicine

## 2016-07-05 ENCOUNTER — Other Ambulatory Visit (HOSPITAL_COMMUNITY)
Admission: RE | Admit: 2016-07-05 | Discharge: 2016-07-05 | Disposition: A | Discharge status: Home / Self Care | Payer: Medicare Other | Source: Ambulatory Visit | Attending: Internal Medicine | Admitting: Internal Medicine

## 2016-07-05 ENCOUNTER — Other Ambulatory Visit (HOSPITAL_BASED_OUTPATIENT_CLINIC_OR_DEPARTMENT_OTHER): Payer: Medicare Other

## 2016-07-05 VITALS — BP 138/67 | HR 99 | Temp 97.6°F | Resp 18 | Ht 66.0 in | Wt 138.9 lb

## 2016-07-05 DIAGNOSIS — C3411 Malignant neoplasm of upper lobe, right bronchus or lung: Secondary | ICD-10-CM

## 2016-07-05 DIAGNOSIS — R079 Chest pain, unspecified: Secondary | ICD-10-CM

## 2016-07-05 DIAGNOSIS — C7951 Secondary malignant neoplasm of bone: Secondary | ICD-10-CM

## 2016-07-05 DIAGNOSIS — Z5112 Encounter for antineoplastic immunotherapy: Secondary | ICD-10-CM

## 2016-07-05 DIAGNOSIS — Z006 Encounter for examination for normal comparison and control in clinical research program: Secondary | ICD-10-CM

## 2016-07-05 DIAGNOSIS — Z79899 Other long term (current) drug therapy: Secondary | ICD-10-CM

## 2016-07-05 DIAGNOSIS — Z95828 Presence of other vascular implants and grafts: Secondary | ICD-10-CM | POA: Insufficient documentation

## 2016-07-05 LAB — CBC WITH DIFFERENTIAL/PLATELET
BASO%: 0.7 % (ref 0.0–2.0)
Basophils Absolute: 0.1 10*3/uL (ref 0.0–0.1)
EOS ABS: 0.6 10*3/uL — AB (ref 0.0–0.5)
EOS%: 6.3 % (ref 0.0–7.0)
HEMATOCRIT: 41.1 % (ref 38.4–49.9)
HEMOGLOBIN: 14 g/dL (ref 13.0–17.1)
LYMPH#: 1.8 10*3/uL (ref 0.9–3.3)
LYMPH%: 20.5 % (ref 14.0–49.0)
MCH: 33.3 pg (ref 27.2–33.4)
MCHC: 34.1 g/dL (ref 32.0–36.0)
MCV: 97.6 fL (ref 79.3–98.0)
MONO#: 0.9 10*3/uL (ref 0.1–0.9)
MONO%: 10.1 % (ref 0.0–14.0)
NEUT%: 62.4 % (ref 39.0–75.0)
NEUTROS ABS: 5.5 10*3/uL (ref 1.5–6.5)
PLATELETS: 231 10*3/uL (ref 140–400)
RBC: 4.21 10*6/uL (ref 4.20–5.82)
RDW: 12.9 % (ref 11.0–14.6)
WBC: 8.9 10*3/uL (ref 4.0–10.3)

## 2016-07-05 LAB — COMPREHENSIVE METABOLIC PANEL
ALBUMIN: 2.8 g/dL — AB (ref 3.5–5.0)
ALT: 14 U/L (ref 0–55)
ANION GAP: 11 meq/L (ref 3–11)
AST: 17 U/L (ref 5–34)
Alkaline Phosphatase: 117 U/L (ref 40–150)
BUN: 12.8 mg/dL (ref 7.0–26.0)
CALCIUM: 9.6 mg/dL (ref 8.4–10.4)
CHLORIDE: 100 meq/L (ref 98–109)
CO2: 29 mEq/L (ref 22–29)
CREATININE: 1 mg/dL (ref 0.7–1.3)
EGFR: 75 mL/min/{1.73_m2} — AB (ref >90)
GLUCOSE: 194 mg/dL — AB (ref 70–140)
POTASSIUM: 3.5 meq/L (ref 3.5–5.1)
Sodium: 139 mEq/L (ref 136–145)
TOTAL PROTEIN: 6.9 g/dL (ref 6.4–8.3)
Total Bilirubin: 0.35 mg/dL (ref 0.20–1.20)

## 2016-07-05 LAB — TSH: TSH: 4.989 m(IU)/L — ABNORMAL HIGH (ref 0.320–4.118)

## 2016-07-05 LAB — LACTATE DEHYDROGENASE: LDH: 151 U/L (ref 125–245)

## 2016-07-05 LAB — PHOSPHORUS: PHOSPHORUS: 3.4 mg/dL (ref 2.5–4.6)

## 2016-07-05 LAB — MAGNESIUM: MAGNESIUM: 2 mg/dL (ref 1.5–2.5)

## 2016-07-05 MED ORDER — SODIUM CHLORIDE 0.9 % IV SOLN
Freq: Once | INTRAVENOUS | Status: AC
  Administered 2016-07-05: 10:00:00 via INTRAVENOUS

## 2016-07-05 MED ORDER — HEPARIN SOD (PORK) LOCK FLUSH 100 UNIT/ML IV SOLN
500.0000 [IU] | Freq: Once | INTRAVENOUS | Status: AC | PRN
  Administered 2016-07-05: 500 [IU]
  Filled 2016-07-05: qty 5

## 2016-07-05 MED ORDER — SODIUM CHLORIDE 0.9 % IJ SOLN
10.0000 mL | INTRAMUSCULAR | Status: DC | PRN
  Administered 2016-07-05: 10 mL
  Filled 2016-07-05: qty 10

## 2016-07-05 MED ORDER — SODIUM CHLORIDE 0.9 % IJ SOLN
10.0000 mL | INTRAMUSCULAR | Status: DC | PRN
  Administered 2016-07-05: 10 mL via INTRAVENOUS
  Filled 2016-07-05: qty 10

## 2016-07-05 MED ORDER — INV-NIVOLUMAB CHEMO INJECTION 100 MG/10 ML BMS CA 209-370
240.0000 mg | Freq: Once | INTRAVENOUS | Status: AC
  Administered 2016-07-05: 240 mg via INTRAVENOUS
  Filled 2016-07-05: qty 24

## 2016-07-05 NOTE — Patient Instructions (Signed)
[facility] Discharge Instructions for Patients Receiving Chemotherapy  Today you received the following chemotherapy agents Nivolumab.  To help prevent nausea and vomiting after your treatment, we encourage you to take your nausea medication as prescribed.   If you develop nausea and vomiting that is not controlled by your nausea medication, call the clinic.   BELOW ARE SYMPTOMS THAT SHOULD BE REPORTED IMMEDIATELY:  *FEVER GREATER THAN 100.5 F  *CHILLS WITH OR WITHOUT FEVER  NAUSEA AND VOMITING THAT IS NOT CONTROLLED WITH YOUR NAUSEA MEDICATION  *UNUSUAL SHORTNESS OF BREATH  *UNUSUAL BRUISING OR BLEEDING  TENDERNESS IN MOUTH AND THROAT WITH OR WITHOUT PRESENCE OF ULCERS  *URINARY PROBLEMS  *BOWEL PROBLEMS  UNUSUAL RASH Items with * indicate a potential emergency and should be followed up as soon as possible.  Feel free to call the clinic you have any questions or concerns. The clinic phone number is [phone].  Please show the CHEMO ALERT CARD at check-in to the Emergency Department and triage nurse.   

## 2016-07-05 NOTE — Patient Instructions (Signed)

## 2016-07-05 NOTE — Progress Notes (Signed)
[facility] Telephone:([phone]) [phone]   Fax:(336) [place], [place] [address] [place] [address]  DIAGNOSIS: Stage IV (T3, N3, M1b) non-small cell lung cancer, adenocarcinoma diagnosed in January 2016 presented with right upper lobe lung mass in addition to mediastinal lymphadenopathy and large right pleural effusion as well as bone metastasis.  PRIOR THERAPY:  1. Status post Pleurx catheter placement by Dr. Servando Snare for drainage of the right pleural effusion.  2. Systemic chemotherapy with carboplatin for AUC of 5 and Alimta 500 MG/M2 every 3 weeks. Status post 6 cycles. 3. Status post palliative radiotherapy to the metastatic bone lesion in the right glenoid as well as the right ilium area completed 02/18/2016  CURRENT THERAPY: Patient is participating in the [place] 370 clinical trial and has been randomized to group A arm B-3 to receive Nivolumab 240 mg given every 2 weeks. First cycle expected 06/10/2015. Status post 27 cycles.  INTERVAL HISTORY: Omar Peters 81 y.o. male returns to the clinic today for follow-up visit accompanied by his wife. The patient has no complaints today except for the left-sided chest pain relieved with ibuprofen. He does not have the pain on the time but increased with the change in position. He tolerated the last cycle of his treatment with Nivolumab fairly well with no significant adverse effects. He denied having any significant fever or chills. He has no nausea, vomiting, diarrhea or constipation. The patient denied having any significant skin rash. He denied having any significant weight loss or night sweats. He has no chest pain, shortness of breath, cough or hemoptysis. He is here today for cycle #28 of his treatment.  MEDICAL HISTORY: Past Medical History  Diagnosis Date  . Hypertension   . GERD (gastroesophageal reflux disease)   . Shortness of breath dyspnea     01/14/15 at night and  has to sit up  . Anxiety     due to diagnosis of Stage 4 Lung Cancer  . Macular degeneration of right eye   . Diabetes mellitus without complication ([place])   . Lung cancer ([place])     stage IV non small cell lung cancer dx. January 2016   . Bone cancer ([place])     non small cell lung with mets to bone    ALLERGIES:  is allergic to amitriptyline and marinol.  MEDICATIONS:  Current Outpatient Prescriptions  Medication Sig Dispense Refill  . amitriptyline (ELAVIL) 10 MG tablet Take 1 tablet (10 mg total) by mouth at bedtime. 30 tablet 0  . amLODipine (NORVASC) 10 MG tablet Take 10 mg by mouth daily.    Marland Kitchen aspirin 81 MG tablet Take 81 mg by mouth daily.    Marland Kitchen lidocaine-prilocaine (EMLA) cream Apply 1 application topically as needed. Pt is to apply cream 1 hour before receiving chemo 30 g 0  . magnesium hydroxide (MILK OF MAGNESIA) 400 MG/5ML suspension Take 30 mLs by mouth daily as needed for mild constipation.    . metFORMIN (GLUCOPHAGE) 500 MG tablet Take 500 mg by mouth daily.    . Naproxen Sodium (ALEVE PO) Take 1 tablet by mouth daily as needed. Reported on 04/11/2016    . ranitidine (ZANTAC) 150 MG tablet Take 150 mg by mouth daily.    Marland Kitchen senna-docusate (SENNA S) 8.6-50 MG per tablet Take 2 tablets by mouth daily.    . B Complex-Biotin-FA (B-COMPLEX PO) Take 1 each by mouth daily. Reported on 03/01/2016    .  HYDROcodone-acetaminophen (NORCO) 5-325 MG per tablet Take 1 tablet by mouth every 6 (six) hours as needed for moderate pain. (Patient not taking: Reported on 07/05/2016) 30 tablet 0  . losartan-hydrochlorothiazide (HYZAAR) 100-25 MG tablet Reported on 07/05/2016    . prochlorperazine (COMPAZINE) 10 MG tablet Take 1 tablet (10 mg total) by mouth every 6 (six) hours as needed for nausea or vomiting. (Patient not taking: Reported on 07/05/2016) 30 tablet 0   No current facility-administered medications for this visit.    SURGICAL HISTORY:  Past Surgical History  Procedure Laterality Date    . Vein ligation and stripping  1977  . Chest tube insertion Right 01/15/2015    Procedure: INSERTION PLEURAL DRAINAGE CATHETER WITH ULTRASOUND AND FLURO GUIDANCE;  Surgeon: Grace Isaac, MD;  Location: [facility];  Service: Thoracic;  Laterality: Right;  . Removal of pleural drainage catheter Right 03/26/2015    Procedure: REMOVAL OF PLEURAL DRAINAGE CATHETER;  Surgeon: Grace Isaac, MD;  Location: [facility];  Service: Thoracic;  Laterality: Right;    REVIEW OF SYSTEMS:  A comprehensive review of systems was negative except for: Constitutional: positive for fatigue Respiratory: positive for pleurisy/chest pain   PHYSICAL EXAMINATION: General appearance: alert, cooperative and no distress Head: Normocephalic, without obvious abnormality, atraumatic Neck: no adenopathy, no JVD, supple, symmetrical, trachea midline and thyroid not enlarged, symmetric, no tenderness/mass/nodules Lymph nodes: Cervical, supraclavicular, and axillary nodes normal. Resp: clear to auscultation bilaterally Back: symmetric, no curvature. ROM normal. No CVA tenderness. Cardio: regular rate and rhythm, S1, S2 normal, no murmur, click, rub or gallop GI: soft, non-tender; bowel sounds normal; no masses,  no organomegaly Extremities: extremities normal, atraumatic, no cyanosis or edema Neurologic: Alert and oriented X 3, normal strength and tone. Normal symmetric reflexes. Normal coordination and gait  ECOG PERFORMANCE STATUS: 1 - Symptomatic but completely ambulatory  Blood pressure 138/67, pulse 99, temperature 97.6 F (36.4 C), temperature source Oral, resp. rate 18, height '5\' 6"'$  (1.676 m), weight 138 lb 14.4 oz (63.005 kg), SpO2 97 %.  LABORATORY DATA: Lab Results  Component Value Date   WBC 8.9 07/05/2016   HGB 14.0 07/05/2016   HCT 41.1 07/05/2016   MCV 97.6 07/05/2016   PLT 231 07/05/2016      Chemistry      Component Value Date/Time   NA 139 06/21/2016 1009   NA 140 12/02/2015 0400   K 4.0 06/21/2016  1009   K 3.4* 12/02/2015 0400   CL 105 12/02/2015 0400   CO2 29 06/21/2016 1009   CO2 28 12/02/2015 0400   BUN 10.0 06/21/2016 1009   BUN 8 12/02/2015 0400   CREATININE 0.9 06/21/2016 1009   CREATININE 0.76 12/02/2015 0400   GLU 231* 03/10/2015 1539      Component Value Date/Time   CALCIUM 9.7 06/21/2016 1009   CALCIUM 8.8* 12/02/2015 0400   ALKPHOS 119 06/21/2016 1009   ALKPHOS 71 12/02/2015 0400   AST 17 06/21/2016 1009   AST 18 12/02/2015 0400   ALT 14 06/21/2016 1009   ALT 16* 12/02/2015 0400   BILITOT 0.30 06/21/2016 1009   BILITOT 0.7 12/02/2015 0400       RADIOGRAPHIC STUDIES: Ir Fluoro Guide Cv Line Right  06/13/2016  INDICATION: 81 year old male with non-small cell lung cancer in need of durable central venous access for chemotherapy. EXAM: IMPLANTED PORT A CATH PLACEMENT WITH ULTRASOUND AND FLUOROSCOPIC GUIDANCE SUPERIOR VENA CAVAGRAM MEDICATIONS: 2 g Ancef; The antibiotic was administered within an appropriate time interval prior  to skin puncture. ANESTHESIA/SEDATION: Versed 2 mg IV; Fentanyl 50 mcg IV; Moderate Sedation Time:  56 minutes The patient was continuously monitored during the procedure by the interventional radiology nurse under my direct supervision. FLUOROSCOPY TIME:  Three minutes, 48 seconds (72 mGy) COMPLICATIONS: None immediate. Estimated blood loss: 0 PROCEDURE: The right neck and chest was prepped with chlorhexidine, and draped in the usual sterile fashion using maximum barrier technique (cap and mask, sterile gown, sterile gloves, large sterile sheet, hand hygiene and cutaneous antiseptic). Antibiotic prophylaxis was provided with g Ancef administered IV one hour prior to skin incision. Local anesthesia was attained by infiltration with 1% lidocaine with epinephrine. Ultrasound demonstrated patency of the right internal jugular vein, and this was documented with an image. Under real-time ultrasound guidance, this vein was accessed with a 21 gauge  micropuncture needle and image documentation was performed. A small dermatotomy was made at the access site with an 11 scalpel. A 0.018" wire was advanced into the SVC and the access needle exchanged for a 61F micropuncture vascular sheath. The 0.018" wire was then removed and a 0.035" wire advanced into the IVC. An appropriate location for the subcutaneous reservoir was selected below the clavicle and an incision was made through the skin and underlying soft tissues. The subcutaneous tissues were then dissected using a combination of blunt and sharp surgical technique and a pocket was formed. A single lumen power injectable portacatheter was then tunneled through the subcutaneous tissues from the pocket to the dermatotomy and the port reservoir placed within the subcutaneous pocket. The venous access site was then serially dilated and a peel away vascular sheath placed over the wire. The wire was removed and the port catheter advanced into position under fluoroscopic guidance. The catheter tip is positioned in the upper right atrium. This was documented with a spot image. The portacatheter was then tested and 2 flush well pole would not aspirate. The port catheter was visualized in multiple angles fluoroscopically. No kink or tissue was identified. Therefore, a contrast injection was performed to confirm intravascular location of the catheter. The catheter is well positioned with the tip at the superior cavoatrial junction. There is no extravasation or kinking of the catheter. After additional manipulation without successful aspiration the decision was made to exchange for a new port catheter. There appears to be some malfunction with the existing port catheter tubing. Therefore, the port was disassembled and a stiff Glidewire advanced through the catheter and into the inferior vena cava. The existing catheter tubing was removed. A new peel-away sheath was advanced over the wire and into the SVC. A new port catheter  tubing was advanced over the wire and positioned at the cavoatrial junction. The catheter tubing was then cut and catheter re- assembled. The port catheter was again tested and found to aspirate and flush very easily. The poor was placed back into the reservoir pocket. The port was flushed with saline followed by 100 units/mL heparinized saline. The pocket was then closed in two layers using first subdermal inverted interrupted absorbable sutures followed by a running subcuticular suture. The epidermis was then sealed with Dermabond. The dermatotomy at the venous access site was also sealed with Dermabond. IMPRESSION: Successful placement of a right IJ approach Power Port with ultrasound and fluoroscopic guidance. The catheter is ready for use. Signed, Criselda Peaches, MD [facility] Radiology Electronically Signed   By: Jacqulynn Cadet M.D.   On: 06/13/2016 17:33   Ir US Guide  Vasc Access Right  06/13/2016  INDICATION: 81 year old male with non-small cell lung cancer in need of durable central venous access for chemotherapy. EXAM: IMPLANTED PORT A CATH PLACEMENT WITH ULTRASOUND AND FLUOROSCOPIC GUIDANCE SUPERIOR VENA CAVAGRAM MEDICATIONS: 2 g Ancef; The antibiotic was administered within an appropriate time interval prior to skin puncture. ANESTHESIA/SEDATION: Versed 2 mg IV; Fentanyl 50 mcg IV; Moderate Sedation Time:  56 minutes The patient was continuously monitored during the procedure by the interventional radiology nurse under my direct supervision. FLUOROSCOPY TIME:  Three minutes, 48 seconds (72 mGy) COMPLICATIONS: None immediate. Estimated blood loss: 0 PROCEDURE: The right neck and chest was prepped with chlorhexidine, and draped in the usual sterile fashion using maximum barrier technique (cap and mask, sterile gown, sterile gloves, large sterile sheet, hand hygiene and cutaneous antiseptic). Antibiotic prophylaxis was provided with g Ancef  administered IV one hour prior to skin incision. Local anesthesia was attained by infiltration with 1% lidocaine with epinephrine. Ultrasound demonstrated patency of the right internal jugular vein, and this was documented with an image. Under real-time ultrasound guidance, this vein was accessed with a 21 gauge micropuncture needle and image documentation was performed. A small dermatotomy was made at the access site with an 11 scalpel. A 0.018" wire was advanced into the SVC and the access needle exchanged for a 30F micropuncture vascular sheath. The 0.018" wire was then removed and a 0.035" wire advanced into the IVC. An appropriate location for the subcutaneous reservoir was selected below the clavicle and an incision was made through the skin and underlying soft tissues. The subcutaneous tissues were then dissected using a combination of blunt and sharp surgical technique and a pocket was formed. A single lumen power injectable portacatheter was then tunneled through the subcutaneous tissues from the pocket to the dermatotomy and the port reservoir placed within the subcutaneous pocket. The venous access site was then serially dilated and a peel away vascular sheath placed over the wire. The wire was removed and the port catheter advanced into position under fluoroscopic guidance. The catheter tip is positioned in the upper right atrium. This was documented with a spot image. The portacatheter was then tested and 2 flush well pole would not aspirate. The port catheter was visualized in multiple angles fluoroscopically. No kink or tissue was identified. Therefore, a contrast injection was performed to confirm intravascular location of the catheter. The catheter is well positioned with the tip at the superior cavoatrial junction. There is no extravasation or kinking of the catheter. After additional manipulation without successful aspiration the decision was made to exchange for a new port catheter. There appears  to be some malfunction with the existing port catheter tubing. Therefore, the port was disassembled and a stiff Glidewire advanced through the catheter and into the inferior vena cava. The existing catheter tubing was removed. A new peel-away sheath was advanced over the wire and into the SVC. A new port catheter tubing was advanced over the wire and positioned at the cavoatrial junction. The catheter tubing was then cut and catheter re- assembled. The port catheter was again tested and found to aspirate and flush very easily. The poor was placed back into the reservoir pocket. The port was flushed with saline followed by 100 units/mL heparinized saline. The pocket was then closed in two layers using first subdermal inverted interrupted absorbable sutures followed by a running subcuticular suture. The epidermis was then sealed with Dermabond. The dermatotomy at the venous access site was also sealed with Dermabond. IMPRESSION:  Successful placement of a right IJ approach Power Port with ultrasound and fluoroscopic guidance. The catheter is ready for use. Signed, Criselda Peaches, MD [facility] Radiology Electronically Signed   By: Jacqulynn Cadet M.D.   On: 06/13/2016 17:33    ASSESSMENT AND PLAN: This is a very pleasant 81 years old white male with history of stage IV non-small cell lung cancer, adenocarcinoma status post induction chemotherapy was carboplatin and Alimta and currently undergoing treatment with immunotherapy according to the [employer] 370 clinical trial. He was randomized to the Nivolumab arm 240 mg IV every 2 weeks. He is status post 26 cycles and tolerating his treatment fairly well.  I recommended for the patient to continue his treatment with Nivolumab and he will proceed with cycle #28 today as a scheduled.  The patient would come back for follow-up visit in 2 weeks for reevaluation before starting cycle #29. He would have repeat  CT scan of the chest, abdomen and pelvis performed on 07/12/2016 for restaging of his disease. For the left sided chest pain, this is most likely musculoskeletal and we will wait for the CT scan of the chest next week for further evaluation of this area. He will continue his current medication was ibuprofen as needed. The patient was advised to call immediately if he has any concerning symptoms in the interval. The patient voices understanding of current disease status and treatment options and is in agreement with the current care plan.  All questions were answered. The patient knows to call the clinic with any problems, questions or concerns. We can certainly see the patient much sooner if necessary.  Disclaimer: This note was dictated with voice recognition software. Similar sounding words can inadvertently be transcribed and may not be corrected upon review.

## 2016-07-05 NOTE — Progress Notes (Signed)
[facility] 370: Group A, Arm B3 Cycle 28/Week 54 I met with patient and spouse today after patient's lab appointment. Resting vitals and O2 obtained. Patient here today for start of cycle 28. Patient reports a decrease in appetite and has dropped 2 lbs since his last visit. Patient states "I'm just not hungry," wife confirms that patient drinks one premiere protein drink daily to help supplement his nutritional intake. Patient also reports an increase in fatigue and feels this may be related to the increased temperatures outside. He also states he gets SOB with exertion, continues with the occasional cough and with occasional production of white phlegm during cough. Patient also continues to report the occasional pain to the left chest region, states pain is positional, describes as a "cramp" and states it travels through from front to back vs around his side to the back, reports now on-going for approximately "one month" and takes Naproxen as needed to alleviate any discomfort. Patient reports pain isn't "too bad" for him to have to take Naproxen often, per MD, the upcoming CT may help to assess this. Patient denies any nausea or vomiting, denies and new bowel or bladder problems. Patient also denies fevers, skin rashes, neuro problems or swelling. Per MD assessment of patient and review of all labs including NCS labs, patient cleared for cycle 28 today. All patient's and spouses questions answered to their satisfaction and patient encouraged to call Dr. Julien Nordmann or myself with any questions or concerns. Patient is aware of 7/26 CT scan and has received contrast today. Study generated vial assignment provided to PharmD Romualdo Bolk Treatment instruction infusion sign provided to [place] and Hassan Rowan RN, all questions answered. Next appt: lab/MD/tx 07/20/2016 Adele Dan, RN, BSN Clinical Research 07/05/2016 11:00 AM

## 2016-07-05 NOTE — Telephone Encounter (Signed)
Gave wife avs report and appointments for August.

## 2016-07-12 ENCOUNTER — Ambulatory Visit (HOSPITAL_COMMUNITY)
Admission: RE | Admit: 2016-07-12 | Discharge: 2016-07-12 | Disposition: A | Discharge status: Home / Self Care | Payer: Medicare Other | Source: Ambulatory Visit | Attending: Internal Medicine | Admitting: Internal Medicine

## 2016-07-12 ENCOUNTER — Encounter (HOSPITAL_COMMUNITY): Payer: Self-pay

## 2016-07-12 DIAGNOSIS — I700 Atherosclerosis of aorta: Secondary | ICD-10-CM | POA: Diagnosis not present

## 2016-07-12 DIAGNOSIS — I2510 Atherosclerotic heart disease of native coronary artery without angina pectoris: Secondary | ICD-10-CM | POA: Insufficient documentation

## 2016-07-12 DIAGNOSIS — R590 Localized enlarged lymph nodes: Secondary | ICD-10-CM | POA: Insufficient documentation

## 2016-07-12 DIAGNOSIS — Z9225 Personal history of immunosupression therapy: Secondary | ICD-10-CM | POA: Diagnosis not present

## 2016-07-12 DIAGNOSIS — C3411 Malignant neoplasm of upper lobe, right bronchus or lung: Secondary | ICD-10-CM | POA: Diagnosis not present

## 2016-07-12 DIAGNOSIS — C7951 Secondary malignant neoplasm of bone: Secondary | ICD-10-CM | POA: Insufficient documentation

## 2016-07-12 DIAGNOSIS — Z5112 Encounter for antineoplastic immunotherapy: Secondary | ICD-10-CM

## 2016-07-12 MED ORDER — IOPAMIDOL (ISOVUE-300) INJECTION 61%
100.0000 mL | Freq: Once | INTRAVENOUS | Status: AC | PRN
  Administered 2016-07-12: 100 mL via INTRAVENOUS

## 2016-07-18 ENCOUNTER — Other Ambulatory Visit: Payer: Self-pay | Admitting: Medical Oncology

## 2016-07-18 DIAGNOSIS — C3411 Malignant neoplasm of upper lobe, right bronchus or lung: Secondary | ICD-10-CM

## 2016-07-19 ENCOUNTER — Other Ambulatory Visit: Payer: Self-pay | Admitting: *Deleted

## 2016-07-19 DIAGNOSIS — C3411 Malignant neoplasm of upper lobe, right bronchus or lung: Secondary | ICD-10-CM

## 2016-07-20 ENCOUNTER — Ambulatory Visit (HOSPITAL_BASED_OUTPATIENT_CLINIC_OR_DEPARTMENT_OTHER): Payer: Medicare Other | Admitting: Internal Medicine

## 2016-07-20 ENCOUNTER — Encounter: Payer: Self-pay | Admitting: Medical Oncology

## 2016-07-20 ENCOUNTER — Ambulatory Visit (HOSPITAL_BASED_OUTPATIENT_CLINIC_OR_DEPARTMENT_OTHER): Payer: Medicare Other

## 2016-07-20 ENCOUNTER — Other Ambulatory Visit (HOSPITAL_COMMUNITY)
Admission: AD | Admit: 2016-07-20 | Discharge: 2016-07-20 | Disposition: A | Discharge status: Home / Self Care | Payer: Medicare Other | Source: Ambulatory Visit | Attending: Internal Medicine | Admitting: Internal Medicine

## 2016-07-20 ENCOUNTER — Encounter: Payer: Self-pay | Admitting: Internal Medicine

## 2016-07-20 ENCOUNTER — Other Ambulatory Visit (HOSPITAL_BASED_OUTPATIENT_CLINIC_OR_DEPARTMENT_OTHER): Payer: Medicare Other

## 2016-07-20 ENCOUNTER — Ambulatory Visit: Payer: Medicare Other

## 2016-07-20 VITALS — BP 135/61 | HR 106 | Temp 97.7°F | Resp 17 | Ht 66.0 in | Wt 136.3 lb

## 2016-07-20 VITALS — HR 102

## 2016-07-20 DIAGNOSIS — R630 Anorexia: Secondary | ICD-10-CM

## 2016-07-20 DIAGNOSIS — Z5112 Encounter for antineoplastic immunotherapy: Secondary | ICD-10-CM

## 2016-07-20 DIAGNOSIS — C7951 Secondary malignant neoplasm of bone: Secondary | ICD-10-CM | POA: Diagnosis not present

## 2016-07-20 DIAGNOSIS — C3411 Malignant neoplasm of upper lobe, right bronchus or lung: Secondary | ICD-10-CM

## 2016-07-20 DIAGNOSIS — R634 Abnormal weight loss: Secondary | ICD-10-CM

## 2016-07-20 DIAGNOSIS — F329 Major depressive disorder, single episode, unspecified: Secondary | ICD-10-CM

## 2016-07-20 DIAGNOSIS — Z006 Encounter for examination for normal comparison and control in clinical research program: Secondary | ICD-10-CM

## 2016-07-20 LAB — CBC WITH DIFFERENTIAL/PLATELET
BASO%: 1 % (ref 0.0–2.0)
Basophils Absolute: 0.1 10*3/uL (ref 0.0–0.1)
EOS%: 5.7 % (ref 0.0–7.0)
Eosinophils Absolute: 0.6 10*3/uL — ABNORMAL HIGH (ref 0.0–0.5)
HCT: 43.3 % (ref 38.4–49.9)
HEMOGLOBIN: 14.4 g/dL (ref 13.0–17.1)
LYMPH%: 17.5 % (ref 14.0–49.0)
MCH: 33.1 pg (ref 27.2–33.4)
MCHC: 33.3 g/dL (ref 32.0–36.0)
MCV: 99.3 fL — AB (ref 79.3–98.0)
MONO#: 1.3 10*3/uL — ABNORMAL HIGH (ref 0.1–0.9)
MONO%: 12.9 % (ref 0.0–14.0)
NEUT%: 62.9 % (ref 39.0–75.0)
NEUTROS ABS: 6.2 10*3/uL (ref 1.5–6.5)
Platelets: 222 10*3/uL (ref 140–400)
RBC: 4.36 10*6/uL (ref 4.20–5.82)
RDW: 13 % (ref 11.0–14.6)
WBC: 9.9 10*3/uL (ref 4.0–10.3)
lymph#: 1.7 10*3/uL (ref 0.9–3.3)

## 2016-07-20 LAB — COMPREHENSIVE METABOLIC PANEL
ALBUMIN: 2.9 g/dL — AB (ref 3.5–5.0)
ALK PHOS: 115 U/L (ref 40–150)
ALT: 16 U/L (ref 0–55)
AST: 16 U/L (ref 5–34)
Anion Gap: 10 mEq/L (ref 3–11)
BUN: 17.1 mg/dL (ref 7.0–26.0)
CO2: 28 meq/L (ref 22–29)
Calcium: 10.4 mg/dL (ref 8.4–10.4)
Chloride: 101 mEq/L (ref 98–109)
Creatinine: 0.8 mg/dL (ref 0.7–1.3)
EGFR: 86 mL/min/{1.73_m2} — AB (ref >90)
GLUCOSE: 123 mg/dL (ref 70–140)
POTASSIUM: 3.5 meq/L (ref 3.5–5.1)
SODIUM: 138 meq/L (ref 136–145)
Total Bilirubin: 0.45 mg/dL (ref 0.20–1.20)
Total Protein: 6.9 g/dL (ref 6.4–8.3)

## 2016-07-20 LAB — MAGNESIUM: Magnesium: 1.9 mg/dl (ref 1.5–2.5)

## 2016-07-20 LAB — LACTATE DEHYDROGENASE: LDH: 94 U/L — AB (ref 98–192)

## 2016-07-20 LAB — PHOSPHORUS: Phosphorus: 3.4 mg/dL (ref 2.5–4.6)

## 2016-07-20 MED ORDER — MIRTAZAPINE 30 MG PO TABS: 30.0000 mg | ORAL_TABLET | Freq: Every day | ORAL | 2 refills | Status: DC

## 2016-07-20 MED ORDER — SODIUM CHLORIDE 0.9 % IJ SOLN
10.0000 mL | INTRAMUSCULAR | Status: DC | PRN
  Administered 2016-07-20: 10 mL
  Filled 2016-07-20: qty 10

## 2016-07-20 MED ORDER — HEPARIN SOD (PORK) LOCK FLUSH 100 UNIT/ML IV SOLN
500.0000 [IU] | Freq: Once | INTRAVENOUS | Status: AC | PRN
  Administered 2016-07-20: 500 [IU]
  Filled 2016-07-20: qty 5

## 2016-07-20 MED ORDER — SODIUM CHLORIDE 0.9 % IV SOLN
240.0000 mg | Freq: Once | INTRAVENOUS | Status: AC
  Administered 2016-07-20: 240 mg via INTRAVENOUS
  Filled 2016-07-20: qty 24

## 2016-07-20 MED ORDER — SODIUM CHLORIDE 0.9 % IJ SOLN
10.0000 mL | Freq: Once | INTRAMUSCULAR | Status: AC
  Administered 2016-07-20: 10 mL
  Filled 2016-07-20: qty 10

## 2016-07-20 MED ORDER — SODIUM CHLORIDE 0.9 % IV SOLN
Freq: Once | INTRAVENOUS | Status: AC
  Administered 2016-07-20: 10:00:00 via INTRAVENOUS

## 2016-07-20 NOTE — Progress Notes (Signed)
[place] [phone]   Fax:([phone]) [place], MD [address] [place]  [phone]  DIAGNOSIS: Stage IV (T3, N3, M1b) non-small cell lung cancer, adenocarcinoma diagnosed in January 2016 presented with right upper lobe lung mass in addition to mediastinal lymphadenopathy and large right pleural effusion as well as bone metastasis.  PRIOR THERAPY:  1. Status post Pleurx catheter placement by Dr. Servando Snare for drainage of the right pleural effusion.  2. Systemic chemotherapy with carboplatin for AUC of 5 and Alimta 500 MG/M2 every 3 weeks. Status post 6 cycles. 3. Status post palliative radiotherapy to the metastatic bone lesion in the right glenoid as well as the right ilium area completed 02/18/2016  CURRENT THERAPY: Patient is participating in the [employer] 370 clinical trial and has been randomized to group A arm B-3 to receive Nivolumab 240 mg given every 2 weeks. First cycle expected 06/10/2015. Status post 28 cycles.  INTERVAL HISTORY: ARAM DOMZALSKI 81 y.o. male returns to the clinic today for follow-up visit accompanied by his wife. The patient has no complaints today except for lack of appetite and lost few pounds recently. His left sided chest pain had improved. He tolerated the last cycle of his treatment with Nivolumab fairly well with no significant adverse effects. He denied having any significant fever or chills. He has no nausea, vomiting, diarrhea or constipation. The patient denied having any significant skin rash. He denied having any significant weight loss or night sweats. He has no chest pain, shortness of breath, cough or hemoptysis. He had repeat CT scan of the chest, abdomen and pelvis performed recently and he is here for evaluation and discussion of his scan results.  MEDICAL HISTORY: Past Medical History:  Diagnosis Date  . Anxiety    due to diagnosis of Stage 4 Lung Cancer  . Bone cancer  ([place])    non small cell lung with mets to bone  . Diabetes mellitus without complication ([place])   . GERD (gastroesophageal reflux disease)   . Hypertension   . Lung cancer ([place])    stage IV non small cell lung cancer dx. January 2016   . Macular degeneration of right eye   . Shortness of breath dyspnea    01/14/15 at night and has to sit up    ALLERGIES:  is allergic to amitriptyline and marinol [dronabinol].  MEDICATIONS:  Current Outpatient Prescriptions  Medication Sig Dispense Refill  . amitriptyline (ELAVIL) 10 MG tablet Take 1 tablet (10 mg total) by mouth at bedtime. 30 tablet 0  . amLODipine (NORVASC) 10 MG tablet Take 10 mg by mouth daily.    Marland Kitchen aspirin 81 MG tablet Take 81 mg by mouth daily.    . B Complex-Biotin-FA (B-COMPLEX PO) Take 1 each by mouth daily. Reported on 03/01/2016    . HYDROcodone-acetaminophen (NORCO) 5-325 MG per tablet Take 1 tablet by mouth every 6 (six) hours as needed for moderate pain. (Patient not taking: Reported on 07/05/2016) 30 tablet 0  . lidocaine-prilocaine (EMLA) cream Apply 1 application topically as needed. Pt is to apply cream 1 hour before receiving chemo 30 g 0  . losartan-hydrochlorothiazide (HYZAAR) 100-25 MG tablet Reported on 07/05/2016    . magnesium hydroxide (MILK OF MAGNESIA) 400 MG/5ML suspension Take 30 mLs by mouth daily as needed for mild constipation.    . metFORMIN (GLUCOPHAGE) 500 MG tablet Take 500 mg by mouth daily.    Marland Kitchen  Naproxen Sodium (ALEVE PO) Take 1 tablet by mouth daily as needed. Reported on 04/11/2016    . prochlorperazine (COMPAZINE) 10 MG tablet Take 1 tablet (10 mg total) by mouth every 6 (six) hours as needed for nausea or vomiting. (Patient not taking: Reported on 07/05/2016) 30 tablet 0  . ranitidine (ZANTAC) 150 MG tablet Take 150 mg by mouth daily.    Marland Kitchen senna-docusate (SENNA S) 8.6-50 MG per tablet Take 2 tablets by mouth daily.     No current facility-administered medications for this visit.     SURGICAL  HISTORY:  Past Surgical History:  Procedure Laterality Date  . CHEST TUBE INSERTION Right 01/15/2015   Procedure: INSERTION PLEURAL DRAINAGE CATHETER WITH ULTRASOUND AND FLURO GUIDANCE;  Surgeon: Grace Isaac, MD;  Location: [facility];  Service: Thoracic;  Laterality: Right;  . REMOVAL OF PLEURAL DRAINAGE CATHETER Right 03/26/2015   Procedure: REMOVAL OF PLEURAL DRAINAGE CATHETER;  Surgeon: Grace Isaac, MD;  Location: [facility];  Service: Thoracic;  Laterality: Right;  . VEIN LIGATION AND STRIPPING  1977    REVIEW OF SYSTEMS:  Constitutional: positive for anorexia and weight loss Eyes: negative Ears, nose, mouth, throat, and face: negative Respiratory: positive for dyspnea on exertion Cardiovascular: negative Gastrointestinal: negative Genitourinary:negative Integument/breast: negative Hematologic/lymphatic: negative Musculoskeletal:negative Neurological: negative Behavioral/Psych: negative Endocrine: negative Allergic/Immunologic: negative   PHYSICAL EXAMINATION: General appearance: alert, cooperative and no distress Head: Normocephalic, without obvious abnormality, atraumatic Neck: no adenopathy, no JVD, supple, symmetrical, trachea midline and thyroid not enlarged, symmetric, no tenderness/mass/nodules Lymph nodes: Cervical, supraclavicular, and axillary nodes normal. Resp: clear to auscultation bilaterally Back: symmetric, no curvature. ROM normal. No CVA tenderness. Cardio: regular rate and rhythm, S1, S2 normal, no murmur, click, rub or gallop GI: soft, non-tender; bowel sounds normal; no masses,  no organomegaly Extremities: extremities normal, atraumatic, no cyanosis or edema Neurologic: Alert and oriented X 3, normal strength and tone. Normal symmetric reflexes. Normal coordination and gait  ECOG PERFORMANCE STATUS: 1 - Symptomatic but completely ambulatory  There were no vitals taken for this visit.  LABORATORY DATA: Lab Results  Component Value Date   WBC 8.9  07/05/2016   HGB 14.0 07/05/2016   HCT 41.1 07/05/2016   MCV 97.6 07/05/2016   PLT 231 07/05/2016      Chemistry      Component Value Date/Time   NA 139 07/05/2016 0817   K 3.5 07/05/2016 0817   CL 105 12/02/2015 0400   CO2 29 07/05/2016 0817   BUN 12.8 07/05/2016 0817   CREATININE 1.0 07/05/2016 0817   GLU 231 (H) 03/10/2015 1539      Component Value Date/Time   CALCIUM 9.6 07/05/2016 0817   ALKPHOS 117 07/05/2016 0817   AST 17 07/05/2016 0817   ALT 14 07/05/2016 0817   BILITOT 0.35 07/05/2016 0817       RADIOGRAPHIC STUDIES: Ct Chest W Contrast  Result Date: 07/12/2016 CLINICAL DATA:  81 year old male with history of lung cancer diagnosed in 2016 status post radiation therapy now complete, undergoing ongoing chemotherapy. Known metastatic disease to the bones. Restaging examination. EXAM: CT CHEST, ABDOMEN, AND PELVIS WITH CONTRAST TECHNIQUE: Multidetector CT imaging of the chest, abdomen and pelvis was performed following the standard protocol during bolus administration of intravenous contrast. CONTRAST:  168m ISOVUE-300 IOPAMIDOL (ISOVUE-300) INJECTION 61% COMPARISON:  CT of the chest, abdomen and pelvis 05/17/2016 and multiple other prior examinations. FINDINGS: RECIST 1.1 Target Lesions: 1. Mass-like area of architectural distortion in the medial aspect of the  right upper lobe measures 6.6 x 3.9 cm (image 18 of series 2), stable. Non-target Lesions: 1. 2. 8 mm short axis AP window lymph node (image 23 of series 2), stable. 3. 9 mm short axis subcarinal lymph node (image 27 of series 2), stable. 4. 11 mm short axis low right paratracheal lymph node (image 23 of series 2), stable. 5. Small chronic thick-walled rim enhancing right pleural effusion, unchanged. 6. Left lower lobe pulmonary nodularity not visualized. 7. Right iliac metastasis, present. 8. Right glenoid metastasis, present. CT CHEST FINDINGS Cardiovascular: Heart size is normal. Trace amount of pericardial fluid  and/or thickening, unlikely to be of any hemodynamic significance at this time, similar to prior examinations. There is aortic atherosclerosis, as well as atherosclerosis of the great vessels of the mediastinum and the coronary arteries, including calcified atherosclerotic plaque in the left main, left anterior descending, left circumflex and right coronary arteries. Mediastinum/Nodes: Multiple borderline enlarged and mildly enlarged mediastinal lymph nodes, generally similar to prior examinations, with the largest stable lymph node measuring 11 mm in short axis in the low right paratracheal nodal station (image 23 of series 2). Slight enlargement of a high right paratracheal lymph node measuring 15 mm in short axis (image 13 of series 2), previously only 11 mm on 05/17/2016). Esophagus is unremarkable in appearance. No axillary lymphadenopathy. Lungs/Pleura: Again noted is a mass-like area of architectural distortion in the medial aspect of the right upper lobe (image 18 of series 2) which appears essentially stable compared to the prior study measuring 6.6 x 3.9 cm. This corresponds to the treated right upper lobe mass. There is chronic consolidation and volume loss in the adjacent portions of the right upper lobe, and the entire right middle lobe which appear unchanged compared to the prior study. Extensive right-sided pleural thickening and enhancement with small chronic right-sided pleural effusion, which is unchanged. The extensive septal thickening throughout the right lower lobe is similar to the prior study, it which could reflect either postradiation changes or lymphangitic spread of disease. Pleural-based masslike opacity in the posterior aspect of the right lower lobe is similar to the prior study measuring 5.3 x 2.1 cm, likely to reflect some developing rounded atelectasis. Diffuse bronchial wall thickening with moderate centrilobular and mild paraseptal emphysema noted throughout the left lung.  Musculoskeletal: Again noted is an expansile predominantly lytic lesion in the lateral aspect of the right sixth rib (image 85 of series 4 which measures approximately 7.4 x 2.2 cm, slightly enlarged compared to the prior study. Infiltrative lytic lesion in the right scapula involving the glenoid and adjacent regions, similar to the prior study. Multiple other predominantly lytic lesions also noted, similar to the prior examination, largest of which is in the T11 vertebral body, and in the posterior elements of T12 on the left side involving the pedicle and transverse process. CT ABDOMEN AND PELVIS FINDINGS Hepatobiliary: Liver has a shrunken appearance and nodular contour, compatible with underlying cirrhosis. No cystic or solid hepatic lesions. No intra or extrahepatic biliary ductal dilatation. Mild thickening of the gallbladder wall, without overt surrounding inflammatory changes (similar to the prior study). Gallbladder wall does not appear distended. No definite gallstones are identified. Pancreas: No pancreatic mass. No pancreatic ductal dilatation. No pancreatic or peripancreatic fluid or inflammatory changes. Spleen: Unremarkable. Adrenals/Urinary Tract: Sub cm low-attenuation lesion in the upper pole of the right kidney is too small to characterize, but similar to the prior study, statistically likely a tiny cyst. Left kidney and bilateral adrenal glands  are normal in appearance. No hydroureteronephrosis to indicate urinary tract obstruction at this time. Urinary bladder is unremarkable in appearance. Stomach/Bowel: Normal appearance of the stomach. No pathologic dilatation of small bowel or colon. Normal appendix. Vascular/Lymphatic: Aortic atherosclerosis, without evidence of aneurysm or dissection in the abdominal or pelvic vasculature. No lymphadenopathy noted in the abdomen or pelvis. Reproductive: Prostate gland and seminal vesicles are unremarkable in appearance. Scrotum is incompletely visualized,  but there is a small left hydrocele. Other: No significant volume of ascites.  No pneumoperitoneum. Musculoskeletal: Infiltrative mixed lytic and sclerotic lesion with extension into the overlying soft tissues in the right ilium currently measures approximately 8.0 x 4.3 cm, and appears slightly larger than the prior examination, at which point this measured up to 7.1 cm in greatest length. No definite new osseous lesions are noted. IMPRESSION: 1. Stable appearance of the treated right upper lobe mass. 2. Mediastinal lymphadenopathy is generally stable compared to the prior examination, with exception of an enlarging high right paratracheal lymph node measuring 15 mm in short axis. 3. Possible lymphangitic spread of tumor in the right lower lobe versus chronic postradiation changes. These findings are stable compared to the prior study, as is a stable thick-walled rim enhancing right-sided pleural fluid collection. 4. Widespread osseous metastatic disease redemonstrated, as above. There are no new lesions. Several of the lesions appear stable compared to the prior study, however, some appear progressive, including the lesion in the lateral aspect of the right sixth rib and the right iliac lesion. 5. No new signs of metastatic disease elsewhere in the abdomen or pelvis. 6. Stigmata of cirrhosis redemonstrated. 7. Aortic atherosclerosis, in addition to left main and 3 vessel coronary artery disease. 8. Additional incidental findings, as above. Electronically Signed   By: Vinnie Langton M.D.   On: 07/12/2016 09:48  Ct Abdomen Pelvis W Contrast  Result Date: 07/12/2016 CLINICAL DATA:  81 year old male with history of lung cancer diagnosed in 2016 status post radiation therapy now complete, undergoing ongoing chemotherapy. Known metastatic disease to the bones. Restaging examination. EXAM: CT CHEST, ABDOMEN, AND PELVIS WITH CONTRAST TECHNIQUE: Multidetector CT imaging of the chest, abdomen and pelvis was performed  following the standard protocol during bolus administration of intravenous contrast. CONTRAST:  139m ISOVUE-300 IOPAMIDOL (ISOVUE-300) INJECTION 61% COMPARISON:  CT of the chest, abdomen and pelvis 05/17/2016 and multiple other prior examinations. FINDINGS: RECIST 1.1 Target Lesions: 1. Mass-like area of architectural distortion in the medial aspect of the right upper lobe measures 6.6 x 3.9 cm (image 18 of series 2), stable. Non-target Lesions: 1. 2. 8 mm short axis AP window lymph node (image 23 of series 2), stable. 3. 9 mm short axis subcarinal lymph node (image 27 of series 2), stable. 4. 11 mm short axis low right paratracheal lymph node (image 23 of series 2), stable. 5. Small chronic thick-walled rim enhancing right pleural effusion, unchanged. 6. Left lower lobe pulmonary nodularity not visualized. 7. Right iliac metastasis, present. 8. Right glenoid metastasis, present. CT CHEST FINDINGS Cardiovascular: Heart size is normal. Trace amount of pericardial fluid and/or thickening, unlikely to be of any hemodynamic significance at this time, similar to prior examinations. There is aortic atherosclerosis, as well as atherosclerosis of the great vessels of the mediastinum and the coronary arteries, including calcified atherosclerotic plaque in the left main, left anterior descending, left circumflex and right coronary arteries. Mediastinum/Nodes: Multiple borderline enlarged and mildly enlarged mediastinal lymph nodes, generally similar to prior examinations, with the largest stable lymph node measuring  11 mm in short axis in the low right paratracheal nodal station (image 23 of series 2). Slight enlargement of a high right paratracheal lymph node measuring 15 mm in short axis (image 13 of series 2), previously only 11 mm on 05/17/2016). Esophagus is unremarkable in appearance. No axillary lymphadenopathy. Lungs/Pleura: Again noted is a mass-like area of architectural distortion in the medial aspect of the right  upper lobe (image 18 of series 2) which appears essentially stable compared to the prior study measuring 6.6 x 3.9 cm. This corresponds to the treated right upper lobe mass. There is chronic consolidation and volume loss in the adjacent portions of the right upper lobe, and the entire right middle lobe which appear unchanged compared to the prior study. Extensive right-sided pleural thickening and enhancement with small chronic right-sided pleural effusion, which is unchanged. The extensive septal thickening throughout the right lower lobe is similar to the prior study, it which could reflect either postradiation changes or lymphangitic spread of disease. Pleural-based masslike opacity in the posterior aspect of the right lower lobe is similar to the prior study measuring 5.3 x 2.1 cm, likely to reflect some developing rounded atelectasis. Diffuse bronchial wall thickening with moderate centrilobular and mild paraseptal emphysema noted throughout the left lung. Musculoskeletal: Again noted is an expansile predominantly lytic lesion in the lateral aspect of the right sixth rib (image 85 of series 4 which measures approximately 7.4 x 2.2 cm, slightly enlarged compared to the prior study. Infiltrative lytic lesion in the right scapula involving the glenoid and adjacent regions, similar to the prior study. Multiple other predominantly lytic lesions also noted, similar to the prior examination, largest of which is in the T11 vertebral body, and in the posterior elements of T12 on the left side involving the pedicle and transverse process. CT ABDOMEN AND PELVIS FINDINGS Hepatobiliary: Liver has a shrunken appearance and nodular contour, compatible with underlying cirrhosis. No cystic or solid hepatic lesions. No intra or extrahepatic biliary ductal dilatation. Mild thickening of the gallbladder wall, without overt surrounding inflammatory changes (similar to the prior study). Gallbladder wall does not appear distended. No  definite gallstones are identified. Pancreas: No pancreatic mass. No pancreatic ductal dilatation. No pancreatic or peripancreatic fluid or inflammatory changes. Spleen: Unremarkable. Adrenals/Urinary Tract: Sub cm low-attenuation lesion in the upper pole of the right kidney is too small to characterize, but similar to the prior study, statistically likely a tiny cyst. Left kidney and bilateral adrenal glands are normal in appearance. No hydroureteronephrosis to indicate urinary tract obstruction at this time. Urinary bladder is unremarkable in appearance. Stomach/Bowel: Normal appearance of the stomach. No pathologic dilatation of small bowel or colon. Normal appendix. Vascular/Lymphatic: Aortic atherosclerosis, without evidence of aneurysm or dissection in the abdominal or pelvic vasculature. No lymphadenopathy noted in the abdomen or pelvis. Reproductive: Prostate gland and seminal vesicles are unremarkable in appearance. Scrotum is incompletely visualized, but there is a small left hydrocele. Other: No significant volume of ascites.  No pneumoperitoneum. Musculoskeletal: Infiltrative mixed lytic and sclerotic lesion with extension into the overlying soft tissues in the right ilium currently measures approximately 8.0 x 4.3 cm, and appears slightly larger than the prior examination, at which point this measured up to 7.1 cm in greatest length. No definite new osseous lesions are noted. IMPRESSION: 1. Stable appearance of the treated right upper lobe mass. 2. Mediastinal lymphadenopathy is generally stable compared to the prior examination, with exception of an enlarging high right paratracheal lymph node measuring 15 mm in  short axis. 3. Possible lymphangitic spread of tumor in the right lower lobe versus chronic postradiation changes. These findings are stable compared to the prior study, as is a stable thick-walled rim enhancing right-sided pleural fluid collection. 4. Widespread osseous metastatic disease  redemonstrated, as above. There are no new lesions. Several of the lesions appear stable compared to the prior study, however, some appear progressive, including the lesion in the lateral aspect of the right sixth rib and the right iliac lesion. 5. No new signs of metastatic disease elsewhere in the abdomen or pelvis. 6. Stigmata of cirrhosis redemonstrated. 7. Aortic atherosclerosis, in addition to left main and 3 vessel coronary artery disease. 8. Additional incidental findings, as above. Electronically Signed   By: Vinnie Langton M.D.   On: 07/12/2016 09:48   ASSESSMENT AND PLAN: This is a very pleasant 81 years old white male with history of stage IV non-small cell lung cancer, adenocarcinoma status post induction chemotherapy was carboplatin and Alimta and currently undergoing treatment with immunotherapy according to the [employer]ated 370 clinical trial. He was randomized to the Nivolumab arm 240 mg IV every 2 weeks. He is status post 28 cycles and tolerating his treatment fairly well.  The recent CT scan of the chest, abdomen and pelvis showed no concerning finding for disease progression. I discussed the scan results with the patient and his wife today. I recommended for the patient to continue his treatment with Nivolumab and he will proceed with cycle #29 today as a scheduled.  The patient would come back for follow-up visit in 2 weeks for reevaluation before starting cycle #30.  For depression and lack of appetite, I will start the patient on Remeron 30 mg by mouth daily at bedtime. The patient was advised to call immediately if he has any concerning symptoms in the interval. The patient voices understanding of current disease status and treatment options and is in agreement with the current care plan.  All questions were answered. The patient knows to call the clinic with any problems, questions or concerns. We can certainly see the patient much sooner if necessary.  Disclaimer: This  note was dictated with voice recognition software. Similar sounding words can inadvertently be transcribed and may not be corrected upon review.

## 2016-07-20 NOTE — Patient Instructions (Signed)
[facility] Discharge Instructions for Patients Receiving Chemotherapy  Today you received the following chemotherapy agents Opdivo.  To help prevent nausea and vomiting after your treatment, we encourage you to take your nausea medication as directed.   If you develop nausea and vomiting that is not controlled by your nausea medication, call the clinic.   BELOW ARE SYMPTOMS THAT SHOULD BE REPORTED IMMEDIATELY:  *FEVER GREATER THAN 100.5 F  *CHILLS WITH OR WITHOUT FEVER  NAUSEA AND VOMITING THAT IS NOT CONTROLLED WITH YOUR NAUSEA MEDICATION  *UNUSUAL SHORTNESS OF BREATH  *UNUSUAL BRUISING OR BLEEDING  TENDERNESS IN MOUTH AND THROAT WITH OR WITHOUT PRESENCE OF ULCERS  *URINARY PROBLEMS  *BOWEL PROBLEMS  UNUSUAL RASH Items with * indicate a potential emergency and should be followed up as soon as possible.  Feel free to call the clinic you have any questions or concerns. The clinic phone number is [phone].  Please show the CHEMO ALERT CARD at check-in to the Emergency Department and triage nurse.    

## 2016-07-20 NOTE — Progress Notes (Signed)
BMS 370: Cycle 29/ Week 56 I met with patient and spouse after his lab appointment in exam room. Vital signs completed. Patient here today for MD assessment, start of Cycle 29 and review of recent CT scan. Patient with a 2 pound weight loss since last appointment and a total of 7.2 pound loss from baseline weight. Patient's main complaint today is fatigue, states that he feels he is getting "weaker and weaker," and reports he has no appetite. Patient with a Grade 1 weight loss overall. Patient has a baseline Grade 2 of Anorexia. Patient reports to have stopped taking Elavil last week Wednesday (7/26) due to cost of drug. Due to patient's lack of appetite and reporting his mood is "down," MD prescribed Remeron for patient to take daily. Patient denies; fevers, nausea or vomiting, diarrhea, skin rashes, or neurological problems. Patient with no edema present and reports no breathing issues/concerns. Patient confirms an occasional ache to upper left chest with improvement, rates it a 2 on pain scale, and reports it as positional. Patient denies any other issues or concerns. MD's review of CT with patient, and reports CT "showed no concerning finding for disease progression." Per MD's assessment of patient and review of all labs, including NCS labs, patient to proceed with Cycle 29 today. All patient's and spouses question's answered to their satisfaction, deny further questions at this time. Patient and spouse encouraged to contact Dr. Julien Nordmann or myself with any questions or concerns.  Patient scheduled for next visit on 8/16 (lab/MD/Tx). Adele Dan, RN, BSN Clinical Research 07/20/2016 11:34 AM

## 2016-07-31 ENCOUNTER — Other Ambulatory Visit: Payer: Self-pay | Admitting: Medical Oncology

## 2016-07-31 DIAGNOSIS — C3411 Malignant neoplasm of upper lobe, right bronchus or lung: Secondary | ICD-10-CM

## 2016-08-02 ENCOUNTER — Encounter: Payer: Self-pay | Admitting: Medical Oncology

## 2016-08-02 ENCOUNTER — Other Ambulatory Visit (HOSPITAL_COMMUNITY)
Admission: RE | Admit: 2016-08-02 | Discharge: 2016-08-02 | Disposition: A | Discharge status: Home / Self Care | Payer: Medicare Other | Source: Ambulatory Visit | Attending: Internal Medicine | Admitting: Internal Medicine

## 2016-08-02 ENCOUNTER — Ambulatory Visit (HOSPITAL_BASED_OUTPATIENT_CLINIC_OR_DEPARTMENT_OTHER): Payer: Medicare Other

## 2016-08-02 ENCOUNTER — Ambulatory Visit: Payer: Medicare Other

## 2016-08-02 ENCOUNTER — Ambulatory Visit (HOSPITAL_BASED_OUTPATIENT_CLINIC_OR_DEPARTMENT_OTHER): Payer: Medicare Other | Admitting: Nurse Practitioner

## 2016-08-02 ENCOUNTER — Other Ambulatory Visit (HOSPITAL_BASED_OUTPATIENT_CLINIC_OR_DEPARTMENT_OTHER): Payer: Medicare Other

## 2016-08-02 VITALS — BP 136/74 | HR 98 | Temp 98.2°F | Resp 17 | Ht 66.0 in | Wt 137.8 lb

## 2016-08-02 DIAGNOSIS — Z006 Encounter for examination for normal comparison and control in clinical research program: Secondary | ICD-10-CM

## 2016-08-02 DIAGNOSIS — Z5112 Encounter for antineoplastic immunotherapy: Secondary | ICD-10-CM | POA: Diagnosis not present

## 2016-08-02 DIAGNOSIS — C3411 Malignant neoplasm of upper lobe, right bronchus or lung: Secondary | ICD-10-CM | POA: Insufficient documentation

## 2016-08-02 DIAGNOSIS — Z95828 Presence of other vascular implants and grafts: Secondary | ICD-10-CM

## 2016-08-02 DIAGNOSIS — C7951 Secondary malignant neoplasm of bone: Secondary | ICD-10-CM

## 2016-08-02 LAB — COMPREHENSIVE METABOLIC PANEL
ALT: 22 U/L (ref 0–55)
AST: 21 U/L (ref 5–34)
Albumin: 2.9 g/dL — ABNORMAL LOW (ref 3.5–5.0)
Alkaline Phosphatase: 128 U/L (ref 40–150)
Anion Gap: 7 mEq/L (ref 3–11)
BILIRUBIN TOTAL: 0.33 mg/dL (ref 0.20–1.20)
BUN: 14.4 mg/dL (ref 7.0–26.0)
CHLORIDE: 104 meq/L (ref 98–109)
CO2: 30 meq/L — AB (ref 22–29)
CREATININE: 0.9 mg/dL (ref 0.7–1.3)
Calcium: 10.1 mg/dL (ref 8.4–10.4)
EGFR: 81 mL/min/{1.73_m2} — ABNORMAL LOW (ref >90)
GLUCOSE: 146 mg/dL — AB (ref 70–140)
Potassium: 3.9 mEq/L (ref 3.5–5.1)
Sodium: 141 mEq/L (ref 136–145)
TOTAL PROTEIN: 6.8 g/dL (ref 6.4–8.3)

## 2016-08-02 LAB — CBC WITH DIFFERENTIAL/PLATELET
BASO%: 0.5 % (ref 0.0–2.0)
Basophils Absolute: 0 10*3/uL (ref 0.0–0.1)
EOS%: 4.3 % (ref 0.0–7.0)
Eosinophils Absolute: 0.4 10*3/uL (ref 0.0–0.5)
HEMATOCRIT: 42.3 % (ref 38.4–49.9)
HEMOGLOBIN: 14.1 g/dL (ref 13.0–17.1)
LYMPH#: 1.6 10*3/uL (ref 0.9–3.3)
LYMPH%: 19.7 % (ref 14.0–49.0)
MCH: 32.9 pg (ref 27.2–33.4)
MCHC: 33.3 g/dL (ref 32.0–36.0)
MCV: 98.7 fL — ABNORMAL HIGH (ref 79.3–98.0)
MONO#: 0.9 10*3/uL (ref 0.1–0.9)
MONO%: 11 % (ref 0.0–14.0)
NEUT%: 64.5 % (ref 39.0–75.0)
NEUTROS ABS: 5.4 10*3/uL (ref 1.5–6.5)
PLATELETS: 201 10*3/uL (ref 140–400)
RBC: 4.28 10*6/uL (ref 4.20–5.82)
RDW: 13.3 % (ref 11.0–14.6)
WBC: 8.3 10*3/uL (ref 4.0–10.3)

## 2016-08-02 LAB — LACTATE DEHYDROGENASE: LDH: 159 U/L (ref 125–245)

## 2016-08-02 LAB — PHOSPHORUS: Phosphorus: 3.9 mg/dL (ref 2.5–4.6)

## 2016-08-02 LAB — MAGNESIUM: Magnesium: 2.3 mg/dl (ref 1.5–2.5)

## 2016-08-02 MED ORDER — INV-NIVOLUMAB CHEMO INJECTION 100 MG/10 ML BMS CA 209-370
240.0000 mg | Freq: Once | INTRAVENOUS | Status: AC
  Administered 2016-08-02: 240 mg via INTRAVENOUS
  Filled 2016-08-02: qty 24

## 2016-08-02 MED ORDER — SODIUM CHLORIDE 0.9 % IJ SOLN
10.0000 mL | INTRAMUSCULAR | Status: DC | PRN
  Administered 2016-08-02: 10 mL via INTRAVENOUS
  Filled 2016-08-02: qty 10

## 2016-08-02 MED ORDER — SODIUM CHLORIDE 0.9 % IV SOLN
Freq: Once | INTRAVENOUS | Status: AC
  Administered 2016-08-02: 13:00:00 via INTRAVENOUS

## 2016-08-02 MED ORDER — HEPARIN SOD (PORK) LOCK FLUSH 100 UNIT/ML IV SOLN
500.0000 [IU] | Freq: Once | INTRAVENOUS | Status: AC | PRN
  Administered 2016-08-02: 500 [IU]
  Filled 2016-08-02: qty 5

## 2016-08-02 MED ORDER — SODIUM CHLORIDE 0.9 % IJ SOLN
10.0000 mL | INTRAMUSCULAR | Status: DC | PRN
  Administered 2016-08-02: 10 mL
  Filled 2016-08-02: qty 10

## 2016-08-02 NOTE — Progress Notes (Addendum)
[facility] OFFICE PROGRESS NOTE   DIAGNOSIS: Stage IV (T3, N3, M1b) non-small cell lung cancer, adenocarcinoma diagnosed in January 2016 presented with right upper lobe lung mass in addition to mediastinal lymphadenopathy and large right pleural effusion as well as bone metastasis.  PRIOR THERAPY:  1. Status post Pleurx catheter placement by Dr. Servando Snare for drainage of the right pleural effusion.  2. Systemic chemotherapy with carboplatin for AUC of 5 and Alimta 500 MG/M2 every 3 weeks. Status post 6 cycles. 3. Status post palliative radiotherapy to the metastatic bone lesion in the right glenoid as well as the right ilium area completed 02/18/2016  CURRENT THERAPY: Patient is participating in the [place] 370 clinical trial and has been randomized to group A arm B-3 to receive Nivolumab 240 mg given every 2 weeks. First cycle expected 06/10/2015. Status post 29 cycles.    INTERVAL HISTORY:   Omar Peters returns as scheduled. He completed cycle 29 nivolumab 07/20/2016. He denies nausea/vomiting. No mouth sores. No diarrhea. He has had some issues with constipation. No change in baseline dyspnea. He has an occasional cough. No rash. No fever. He continues to have left-sided chest pain. The pain "comes and goes". The pain has not changed significantly over the past 4-6 weeks. At times he is able to relieve the pain with position change. He takes Aleve with temporary relief. No numbness or weakness in his extremities.  Objective:  Vital signs in last 24 hours:  Blood pressure 136/74, pulse 98, temperature 98.2 F (36.8 C), temperature source Oral, resp. rate 17, height '5\' 6"'$  (1.676 m), weight 137 lb 12.8 oz (62.5 kg), SpO2 98 %.    HEENT: No thrush or ulcers. Resp: Distant breath sounds. Cardio: Regular rate and rhythm. GI: Abdomen soft and nontender. No hepatomegaly. Vascular: No leg edema. Musculoskeletal: No significant tenderness along the left lateral chest  wall.  Skin: No rash at the left lateral chest wall. Approximate 1 cm skin lesion left anterolateral chest at the base of the rib cage consistent with a sebaceous cyst. Port-A-Cath without erythema.    Lab Results:  Lab Results  Component Value Date   WBC 8.3 08/02/2016   HGB 14.1 08/02/2016   HCT 42.3 08/02/2016   MCV 98.7 (H) 08/02/2016   PLT 201 08/02/2016   NEUTROABS 5.4 08/02/2016    Imaging:  No results found.  Medications: I have reviewed the patient's current medications.  Assessment/Plan: 1. Stage IV non-small cell lung cancer, adenocarcinoma, right upper lobe, metastatic to bone status post induction chemotherapy with carboplatin and Alimta, currently undergoing treatment with immunotherapy according to the [facility]MS [facility] 370 clinical trial. He was randomized to the Nivolumab arm 240 mg IV every 2 weeks. He is status post 29 cycles. 2. Left lateral chest pain.   Disposition: Omar Peters appears stable. He has completed 29 cycles of nivolumab. Plan to proceed with cycle 30 today as scheduled.  He continues to have left lateral chest pain. The pain is essentially unchanged as compared to 4-6 weeks ago. The recent restaging CT evaluation showed no specific cause for the pain. He will try Aleve and Tylenol. If the pain worsens or he develops any new symptoms the plan is to proceed with radiographic evaluation. He understands to contact the office with worsening pain/new symptoms.  He will return for a follow-up visit in 2 weeks. He will contact the office in the interim as outlined above or with any other problems.  ECOG performance status measured at  1.  Plan reviewed with Dr. Julien Nordmann.    [name] ANP/GNP-BC   08/02/2016  12:12 PM

## 2016-08-02 NOTE — Progress Notes (Signed)
BMS 370: Group A, Arm-B3 cycle 30/week 58 Patient and spouse in today for patient's on study cycle 30. I met with patient and spouse after patient's study required labs drawn. Resting vital signs completed, O2 saturation at 98%, and patient maintaining weight since last office visit. Patient reports to have continuing pain/discomfort to the left flank/chest side, states pain feels like a "catch" and is occasional and appears to be positional. Patient reports pain has not changed much in terms of intensity, denies having pain at today's visit, but does report pain a 5-6 on scale of 10 when he's experiencing it, reports to get temporary relief with Aleve. Per Dr. Mohamed should pain worsen or patient has new symptoms, will proceed with radiographic eval. Patient continues to report lack of appetite and lack of energy, but confirms this has not worsened, but also has not improved. Patient confirms to be taking Remeron as directed. Patient denies any nausea, vomiting, fevers or bleeding, denies any new or worsening SOB or cough, denies rash and no edema present. NP, Lisa Thomas' assessment showed patient with what appears to be a sebaceous cyst to the left lateral chest wall, base of rib cage and patient reports no drainage or other issues that he's noticed with this. Patient reports to be sleeping well and reports no new concerns or issues other than reported here. After NP and MD's assessment of patient and all of patients labs, including NCS ones, patient cleared to proceed with cycle 30 today. All patient's and spouses questions answered to their satisfaction. Patient and spouse encouraged to call clinic, Dr. Mohamed or myself with any new or worsening concerns or questions. Patient thanked for his time and continued support of study. Study vial assignment of Nivolumab provided to PharmD Carol Jones. Infusion treatment instructions provided to Lorri S, RN with all questions answered.  08/16/2016-Patient to return  for lab/MD and cycle 31 , , RN, BSN Clinical Research 08/02/2016 2:40 PM 

## 2016-08-02 NOTE — Patient Instructions (Signed)

## 2016-08-15 ENCOUNTER — Other Ambulatory Visit: Payer: Self-pay | Admitting: Medical Oncology

## 2016-08-15 ENCOUNTER — Other Ambulatory Visit: Payer: Self-pay | Admitting: *Deleted

## 2016-08-15 DIAGNOSIS — Z79899 Other long term (current) drug therapy: Secondary | ICD-10-CM

## 2016-08-15 DIAGNOSIS — C3411 Malignant neoplasm of upper lobe, right bronchus or lung: Secondary | ICD-10-CM

## 2016-08-15 DIAGNOSIS — C7951 Secondary malignant neoplasm of bone: Secondary | ICD-10-CM

## 2016-08-15 MED ORDER — MIRTAZAPINE 30 MG PO TABS: 30.0000 mg | ORAL_TABLET | Freq: Every day | ORAL | 0 refills | Status: DC

## 2016-08-16 ENCOUNTER — Other Ambulatory Visit (HOSPITAL_BASED_OUTPATIENT_CLINIC_OR_DEPARTMENT_OTHER): Payer: Medicare Other

## 2016-08-16 ENCOUNTER — Ambulatory Visit: Payer: Medicare Other

## 2016-08-16 ENCOUNTER — Ambulatory Visit (HOSPITAL_BASED_OUTPATIENT_CLINIC_OR_DEPARTMENT_OTHER): Payer: Medicare Other

## 2016-08-16 ENCOUNTER — Other Ambulatory Visit: Payer: Self-pay | Admitting: Internal Medicine

## 2016-08-16 ENCOUNTER — Telehealth: Payer: Self-pay | Admitting: Internal Medicine

## 2016-08-16 ENCOUNTER — Encounter: Payer: Self-pay | Admitting: Internal Medicine

## 2016-08-16 ENCOUNTER — Ambulatory Visit (HOSPITAL_BASED_OUTPATIENT_CLINIC_OR_DEPARTMENT_OTHER): Payer: Medicare Other | Admitting: Internal Medicine

## 2016-08-16 ENCOUNTER — Encounter: Payer: Self-pay | Admitting: Medical Oncology

## 2016-08-16 ENCOUNTER — Other Ambulatory Visit (HOSPITAL_COMMUNITY)
Admission: RE | Admit: 2016-08-16 | Discharge: 2016-08-16 | Disposition: A | Discharge status: Home / Self Care | Payer: Medicare Other | Source: Ambulatory Visit | Attending: Internal Medicine | Admitting: Internal Medicine

## 2016-08-16 VITALS — BP 145/73 | HR 103 | Temp 97.7°F | Resp 18 | Ht 66.0 in | Wt 141.4 lb

## 2016-08-16 DIAGNOSIS — C7951 Secondary malignant neoplasm of bone: Secondary | ICD-10-CM

## 2016-08-16 DIAGNOSIS — C3411 Malignant neoplasm of upper lobe, right bronchus or lung: Secondary | ICD-10-CM

## 2016-08-16 DIAGNOSIS — Z95828 Presence of other vascular implants and grafts: Secondary | ICD-10-CM

## 2016-08-16 DIAGNOSIS — Z006 Encounter for examination for normal comparison and control in clinical research program: Secondary | ICD-10-CM

## 2016-08-16 DIAGNOSIS — Z79899 Other long term (current) drug therapy: Secondary | ICD-10-CM | POA: Diagnosis not present

## 2016-08-16 DIAGNOSIS — Z5112 Encounter for antineoplastic immunotherapy: Secondary | ICD-10-CM

## 2016-08-16 LAB — COMPREHENSIVE METABOLIC PANEL
ALT: 12 U/L (ref 0–55)
ANION GAP: 9 meq/L (ref 3–11)
AST: 16 U/L (ref 5–34)
Albumin: 2.8 g/dL — ABNORMAL LOW (ref 3.5–5.0)
Alkaline Phosphatase: 128 U/L (ref 40–150)
BILIRUBIN TOTAL: 0.4 mg/dL (ref 0.20–1.20)
BUN: 12.3 mg/dL (ref 7.0–26.0)
CHLORIDE: 107 meq/L (ref 98–109)
CO2: 27 meq/L (ref 22–29)
Calcium: 9.5 mg/dL (ref 8.4–10.4)
Creatinine: 0.8 mg/dL (ref 0.7–1.3)
EGFR: 85 mL/min/{1.73_m2} — AB (ref >90)
GLUCOSE: 138 mg/dL (ref 70–140)
Potassium: 3.3 mEq/L — ABNORMAL LOW (ref 3.5–5.1)
SODIUM: 143 meq/L (ref 136–145)
TOTAL PROTEIN: 6.7 g/dL (ref 6.4–8.3)

## 2016-08-16 LAB — CBC WITH DIFFERENTIAL/PLATELET
BASO%: 0.6 % (ref 0.0–2.0)
Basophils Absolute: 0.1 10*3/uL (ref 0.0–0.1)
EOS ABS: 0.3 10*3/uL (ref 0.0–0.5)
EOS%: 3.5 % (ref 0.0–7.0)
HCT: 41.5 % (ref 38.4–49.9)
HGB: 14.3 g/dL (ref 13.0–17.1)
LYMPH#: 2 10*3/uL (ref 0.9–3.3)
LYMPH%: 20.8 % (ref 14.0–49.0)
MCH: 33.4 pg (ref 27.2–33.4)
MCHC: 34.5 g/dL (ref 32.0–36.0)
MCV: 97 fL (ref 79.3–98.0)
MONO#: 1.2 10*3/uL — AB (ref 0.1–0.9)
MONO%: 12.1 % (ref 0.0–14.0)
NEUT%: 63 % (ref 39.0–75.0)
NEUTROS ABS: 6 10*3/uL (ref 1.5–6.5)
PLATELETS: 206 10*3/uL (ref 140–400)
RBC: 4.28 10*6/uL (ref 4.20–5.82)
RDW: 13.9 % (ref 11.0–14.6)
WBC: 9.6 10*3/uL (ref 4.0–10.3)

## 2016-08-16 LAB — TSH: TSH: 3.308 m[IU]/L (ref 0.320–4.118)

## 2016-08-16 LAB — MAGNESIUM: Magnesium: 1.8 mg/dl (ref 1.5–2.5)

## 2016-08-16 LAB — PHOSPHORUS: Phosphorus: 3.5 mg/dL (ref 2.5–4.6)

## 2016-08-16 LAB — LACTATE DEHYDROGENASE: LDH: 153 U/L (ref 125–245)

## 2016-08-16 MED ORDER — SODIUM CHLORIDE 0.9 % IV SOLN
240.0000 mg | Freq: Once | INTRAVENOUS | Status: AC
  Administered 2016-08-16: 240 mg via INTRAVENOUS
  Filled 2016-08-16: qty 24

## 2016-08-16 MED ORDER — TRAMADOL HCL 50 MG PO TABS: 50.0000 mg | ORAL_TABLET | Freq: Four times a day (QID) | ORAL | 0 refills | Status: AC | PRN

## 2016-08-16 MED ORDER — SODIUM CHLORIDE 0.9 % IV SOLN
Freq: Once | INTRAVENOUS | Status: AC
  Administered 2016-08-16: 12:00:00 via INTRAVENOUS

## 2016-08-16 MED ORDER — SODIUM CHLORIDE 0.9 % IJ SOLN
10.0000 mL | INTRAMUSCULAR | Status: DC | PRN
  Administered 2016-08-16: 10 mL via INTRAVENOUS
  Filled 2016-08-16: qty 10

## 2016-08-16 MED ORDER — HEPARIN SOD (PORK) LOCK FLUSH 100 UNIT/ML IV SOLN
500.0000 [IU] | Freq: Once | INTRAVENOUS | Status: AC | PRN
  Administered 2016-08-16: 500 [IU]
  Filled 2016-08-16: qty 5

## 2016-08-16 MED ORDER — SODIUM CHLORIDE 0.9 % IJ SOLN
10.0000 mL | INTRAMUSCULAR | Status: DC | PRN
Start: 2016-08-16 — End: 2016-08-16
  Administered 2016-08-16: 10 mL
  Filled 2016-08-16: qty 10

## 2016-08-16 NOTE — Progress Notes (Signed)
[facility] [phone]   Fax:([phone]) [place], [place] [address] [place] [address]  DIAGNOSIS: Stage IV (T3, N3, M1b) non-small cell lung cancer, adenocarcinoma diagnosed in January 2016 presented with right upper lobe lung mass in addition to mediastinal lymphadenopathy and large right pleural effusion as well as bone metastasis.  PRIOR THERAPY:  1. Status post Pleurx catheter placement by Dr. Servando Snare for drainage of the right pleural effusion.  2. Systemic chemotherapy with carboplatin for AUC of 5 and Alimta 500 MG/M2 every 3 weeks. Status post 6 cycles. 3. Status post palliative radiotherapy to the metastatic bone lesion in the right glenoid as well as the right ilium area completed 02/18/2016  CURRENT THERAPY: The patient is participating in the [place] 370 clinical trial and has been randomized to group A arm B-3 to receive Nivolumab 240 mg given every 2 weeks. First cycle expected 06/10/2015. Status post 29 cycles.  INTERVAL HISTORY: Omar Peters 81 y.o. male returns to the clinic today for follow-up visit accompanied by his wife. The patient has no complaints today except for persistent left-sided chest pain that is positional. He is requesting pain medication with tramadol He tolerated the last cycle of his treatment with Nivolumab fairly well with no significant adverse effects. He denied having any significant fever or chills. He has no nausea, vomiting, diarrhea or constipation. The patient denied having any significant skin rash. He denied having any significant weight loss or night sweats. He has no shortness of breath, cough or hemoptysis. He is here today for evaluation before starting cycle #30.  MEDICAL HISTORY: Past Medical History:  Diagnosis Date  . Anxiety    due to diagnosis of Stage 4 Lung Cancer  . Bone cancer ([facility])    non small cell lung with mets to bone  . Diabetes mellitus without  complication ([facility])   . GERD (gastroesophageal reflux disease)   . Hypertension   . Lung cancer ([place])    stage IV non small cell lung cancer dx. January 2016   . Macular degeneration of right eye   . Shortness of breath dyspnea    01/14/15 at night and has to sit up    ALLERGIES:  is allergic to amitriptyline and marinol [dronabinol].  MEDICATIONS:  Current Outpatient Prescriptions  Medication Sig Dispense Refill  . amitriptyline (ELAVIL) 10 MG tablet Take 1 tablet (10 mg total) by mouth at bedtime. 30 tablet 0  . amLODipine (NORVASC) 10 MG tablet Take 10 mg by mouth daily.    Marland Kitchen aspirin 81 MG tablet Take 81 mg by mouth daily.    . B Complex-Biotin-FA (B-COMPLEX PO) Take 1 each by mouth daily. Reported on 03/01/2016    . lidocaine-prilocaine (EMLA) cream Apply 1 application topically as needed. Pt is to apply cream 1 hour before receiving chemo 30 g 0  . metFORMIN (GLUCOPHAGE) 500 MG tablet Take 500 mg by mouth daily.    . mirtazapine (REMERON) 30 MG tablet Take 1 tablet (30 mg total) by mouth at bedtime. 30 tablet 0  . Naproxen Sodium (ALEVE PO) Take 1 tablet by mouth daily as needed. Reported on 04/11/2016    . ranitidine (ZANTAC) 150 MG tablet Take 150 mg by mouth daily.    Marland Kitchen senna-docusate (SENNA S) 8.6-50 MG per tablet Take 2 tablets by mouth daily.    Marland Kitchen HYDROcodone-acetaminophen (NORCO) 5-325 MG per tablet Take 1 tablet by mouth every  6 (six) hours as needed for moderate pain. (Patient not taking: Reported on 07/05/2016) 30 tablet 0  . magnesium hydroxide (MILK OF MAGNESIA) 400 MG/5ML suspension Take 30 mLs by mouth daily as needed for mild constipation.    . prochlorperazine (COMPAZINE) 10 MG tablet Take 1 tablet (10 mg total) by mouth every 6 (six) hours as needed for nausea or vomiting. (Patient not taking: Reported on 07/05/2016) 30 tablet 0   No current facility-administered medications for this visit.     SURGICAL HISTORY:  Past Surgical History:  Procedure Laterality Date  .  CHEST TUBE INSERTION Right 01/15/2015   Procedure: INSERTION PLEURAL DRAINAGE CATHETER WITH ULTRASOUND AND FLURO GUIDANCE;  Surgeon: Grace Isaac, MD;  Location: [facility];  Service: Thoracic;  Laterality: Right;  . REMOVAL OF PLEURAL DRAINAGE CATHETER Right 03/26/2015   Procedure: REMOVAL OF PLEURAL DRAINAGE CATHETER;  Surgeon: Grace Isaac, MD;  Location: [facility];  Service: Thoracic;  Laterality: Right;  . VEIN LIGATION AND STRIPPING  1977    REVIEW OF SYSTEMS:  A comprehensive review of systems was negative except for: Constitutional: positive for fatigue Respiratory: positive for dyspnea on exertion and pleurisy/chest pain   PHYSICAL EXAMINATION: General appearance: alert, cooperative and no distress Head: Normocephalic, without obvious abnormality, atraumatic Neck: no adenopathy, no JVD, supple, symmetrical, trachea midline and thyroid not enlarged, symmetric, no tenderness/mass/nodules Lymph nodes: Cervical, supraclavicular, and axillary nodes normal. Resp: clear to auscultation bilaterally Back: symmetric, no curvature. ROM normal. No CVA tenderness. Cardio: regular rate and rhythm, S1, S2 normal, no murmur, click, rub or gallop GI: soft, non-tender; bowel sounds normal; no masses,  no organomegaly Extremities: extremities normal, atraumatic, no cyanosis or edema Neurologic: Alert and oriented X 3, normal strength and tone. Normal symmetric reflexes. Normal coordination and gait  ECOG PERFORMANCE STATUS: 1 - Symptomatic but completely ambulatory  Blood pressure (!) 145/73, pulse (!) 103, temperature 97.7 F (36.5 C), temperature source Oral, resp. rate 18, height '5\' 6"'$  (1.676 m), weight 141 lb 6.4 oz (64.1 kg), SpO2 94 %.  LABORATORY DATA: Lab Results  Component Value Date   WBC 9.6 08/16/2016   HGB 14.3 08/16/2016   HCT 41.5 08/16/2016   MCV 97.0 08/16/2016   PLT 206 08/16/2016      Chemistry      Component Value Date/Time   NA 143 08/16/2016 1006   K 3.3 (L)  08/16/2016 1006   CL 105 12/02/2015 0400   CO2 27 08/16/2016 1006   BUN 12.3 08/16/2016 1006   CREATININE 0.8 08/16/2016 1006   GLU 231 (H) 03/10/2015 1539      Component Value Date/Time   CALCIUM 9.5 08/16/2016 1006   ALKPHOS 128 08/16/2016 1006   AST 16 08/16/2016 1006   ALT 12 08/16/2016 1006   BILITOT 0.40 08/16/2016 1006       RADIOGRAPHIC STUDIES: No results found.  ASSESSMENT AND PLAN: This is a very pleasant 81 years old white male with history of stage IV non-small cell lung cancer, adenocarcinoma status post induction chemotherapy was carboplatin and Alimta and currently undergoing treatment with immunotherapy according to the BMS checkmated 370 clinical trial. He was randomized to the Nivolumab arm 240 mg IV every 2 weeks. He is status post 29 cycles and tolerating his treatment fairly well.  I recommended for the patient to continue his treatment with Nivolumab and he will proceed with cycle #30 today as a scheduled.  The patient would come back for follow-up visit in 2 weeks  for reevaluation before starting cycle #31.  For depression and lack of appetite, he will continue on Remeron 30 mg by mouth daily at bedtime. For the left-sided chest pain, I gave the patient prescription for tramadol 50 mg by mouth every 6 hours as needed for pain. The patient was advised to call immediately if he has any concerning symptoms in the interval. The patient voices understanding of current disease status and treatment options and is in agreement with the current care plan.  All questions were answered. The patient knows to call the clinic with any problems, questions or concerns. We can certainly see the patient much sooner if necessary.  Disclaimer: This note was dictated with voice recognition software. Similar sounding words can inadvertently be transcribed and may not be corrected upon review.

## 2016-08-16 NOTE — Patient Instructions (Signed)
[facility] Discharge Instructions for Patients Receiving Chemotherapy  Today you received the following chemotherapy agents:  Opdivo (nivolumab)  To help prevent nausea and vomiting after your treatment, we encourage you to take your nausea medication as prescribed.   If you develop nausea and vomiting that is not controlled by your nausea medication, call the clinic.   BELOW ARE SYMPTOMS THAT SHOULD BE REPORTED IMMEDIATELY:  *FEVER GREATER THAN 100.5 F  *CHILLS WITH OR WITHOUT FEVER  NAUSEA AND VOMITING THAT IS NOT CONTROLLED WITH YOUR NAUSEA MEDICATION  *UNUSUAL SHORTNESS OF BREATH  *UNUSUAL BRUISING OR BLEEDING  TENDERNESS IN MOUTH AND THROAT WITH OR WITHOUT PRESENCE OF ULCERS  *URINARY PROBLEMS  *BOWEL PROBLEMS  UNUSUAL RASH Items with * indicate a potential emergency and should be followed up as soon as possible.  Feel free to call the clinic you have any questions or concerns. The clinic phone number is [phone].  Please show the [facility] at check-in to the Emergency Department and triage nurse.

## 2016-08-16 NOTE — Addendum Note (Signed)
Addended by: Curt Bears on: 08/16/2016 01:11 PM   Modules accepted: Orders

## 2016-08-16 NOTE — Patient Instructions (Signed)

## 2016-08-16 NOTE — Progress Notes (Signed)
BMS 370 Group A Arm B3: Cycle 31/week 60 I met with patient and spouse this morning after patient's lab appointment. Patient here today for cycle 31 of Nivolumab. Vital signs completed and stable. Patient with a +4 lb weight increase and patient reports appetite has slightly improved, states he has found something that he likes to eat. Patient reports no changes to his medication. Patient confirms no nausea, vomiting, diarrhea, rash or neuro problems and no presence of edema. Patient continues with fatigue, but reports not worse, he does report an increase in SOB, especially during exertion such as with ADL's or walking. Patient also continues to complain of pain to the left flank/chest region, states pain is positional and does not occur when he is laying down. Intensity of pain dependent on the position he is in and per patient, occurs mostly when he goes to stand up or when leaning forward, per MD to follow for now, patient will have next CT scheduled for 09/06/16. Patient asking for Tramadol prescription to take instead of Aleve, MD provided patient with prescription. Patient states he feels good otherwise and denies any other complaints. Per MD's assessment of patient and all patient's labs and NCS labs, patient cleared to receive cycle 31 Nivolumab treatment today. All patients and spouses question answered to their satisfaction, patient encouraged to contact Dr. Julien Nordmann or myself with any questions or concerns.  Adele Dan, RN, BSN Clinical Research 08/16/2016 11:47 AM

## 2016-08-18 ENCOUNTER — Other Ambulatory Visit: Payer: Self-pay | Admitting: Medical Oncology

## 2016-08-18 ENCOUNTER — Telehealth: Payer: Self-pay | Admitting: *Deleted

## 2016-08-18 NOTE — Telephone Encounter (Addendum)
Mirtazepine - wife prefers to refill at [facility]-sent to [facility] for signature.

## 2016-08-18 NOTE — Telephone Encounter (Signed)
"  We received an order signed and dated for Mirtazapine with no strength or information filled out on it."  This nurse gave verbal order as indicated in the 08-15-2016 EPIC entry.

## 2016-08-20 ENCOUNTER — Telehealth: Payer: Self-pay | Admitting: Internal Medicine

## 2016-08-20 NOTE — Telephone Encounter (Signed)
S/w pt's wife, gave appts for 9/13 + 9/27 Lynnell Grain md has ordered ct for 9/20 and that radiology will call to schedule. She will collet contrast on 9/13.

## 2016-08-25 ENCOUNTER — Encounter (HOSPITAL_COMMUNITY): Payer: Self-pay | Admitting: Emergency Medicine

## 2016-08-25 ENCOUNTER — Other Ambulatory Visit: Payer: Self-pay | Admitting: Internal Medicine

## 2016-08-25 ENCOUNTER — Other Ambulatory Visit: Payer: Self-pay

## 2016-08-25 ENCOUNTER — Telehealth: Payer: Self-pay | Admitting: Medical Oncology

## 2016-08-25 ENCOUNTER — Emergency Department (HOSPITAL_COMMUNITY): Payer: Medicare Other

## 2016-08-25 ENCOUNTER — Inpatient Hospital Stay (HOSPITAL_COMMUNITY)
Admission: EM | Admit: 2016-08-25 | Discharge: 2016-08-27 | DRG: 189 | Disposition: A | Discharge status: Home / Self Care | Payer: Medicare Other | Attending: Internal Medicine | Admitting: Internal Medicine

## 2016-08-25 DIAGNOSIS — I82431 Acute embolism and thrombosis of right popliteal vein: Secondary | ICD-10-CM | POA: Diagnosis present

## 2016-08-25 DIAGNOSIS — J9601 Acute respiratory failure with hypoxia: Secondary | ICD-10-CM | POA: Diagnosis present

## 2016-08-25 DIAGNOSIS — R609 Edema, unspecified: Secondary | ICD-10-CM

## 2016-08-25 DIAGNOSIS — E119 Type 2 diabetes mellitus without complications: Secondary | ICD-10-CM

## 2016-08-25 DIAGNOSIS — C7951 Secondary malignant neoplasm of bone: Secondary | ICD-10-CM | POA: Diagnosis present

## 2016-08-25 DIAGNOSIS — Z7984 Long term (current) use of oral hypoglycemic drugs: Secondary | ICD-10-CM | POA: Diagnosis not present

## 2016-08-25 DIAGNOSIS — R531 Weakness: Secondary | ICD-10-CM | POA: Diagnosis present

## 2016-08-25 DIAGNOSIS — C3411 Malignant neoplasm of upper lobe, right bronchus or lung: Secondary | ICD-10-CM | POA: Diagnosis present

## 2016-08-25 DIAGNOSIS — J90 Pleural effusion, not elsewhere classified: Secondary | ICD-10-CM | POA: Diagnosis present

## 2016-08-25 DIAGNOSIS — Z6822 Body mass index (BMI) 22.0-22.9, adult: Secondary | ICD-10-CM | POA: Diagnosis not present

## 2016-08-25 DIAGNOSIS — R0602 Shortness of breath: Secondary | ICD-10-CM | POA: Diagnosis present

## 2016-08-25 DIAGNOSIS — E8809 Other disorders of plasma-protein metabolism, not elsewhere classified: Secondary | ICD-10-CM | POA: Diagnosis present

## 2016-08-25 DIAGNOSIS — Z888 Allergy status to other drugs, medicaments and biological substances status: Secondary | ICD-10-CM

## 2016-08-25 DIAGNOSIS — J9690 Respiratory failure, unspecified, unspecified whether with hypoxia or hypercapnia: Secondary | ICD-10-CM | POA: Diagnosis present

## 2016-08-25 DIAGNOSIS — Z79899 Other long term (current) drug therapy: Secondary | ICD-10-CM

## 2016-08-25 DIAGNOSIS — I82401 Acute embolism and thrombosis of unspecified deep veins of right lower extremity: Secondary | ICD-10-CM

## 2016-08-25 DIAGNOSIS — Z7982 Long term (current) use of aspirin: Secondary | ICD-10-CM | POA: Diagnosis not present

## 2016-08-25 DIAGNOSIS — I82441 Acute embolism and thrombosis of right tibial vein: Secondary | ICD-10-CM | POA: Diagnosis present

## 2016-08-25 DIAGNOSIS — I2609 Other pulmonary embolism with acute cor pulmonale: Secondary | ICD-10-CM

## 2016-08-25 DIAGNOSIS — I10 Essential (primary) hypertension: Secondary | ICD-10-CM | POA: Diagnosis present

## 2016-08-25 DIAGNOSIS — I82411 Acute embolism and thrombosis of right femoral vein: Secondary | ICD-10-CM | POA: Diagnosis present

## 2016-08-25 DIAGNOSIS — I2699 Other pulmonary embolism without acute cor pulmonale: Secondary | ICD-10-CM | POA: Diagnosis not present

## 2016-08-25 DIAGNOSIS — R634 Abnormal weight loss: Secondary | ICD-10-CM | POA: Diagnosis present

## 2016-08-25 LAB — CBC WITH DIFFERENTIAL/PLATELET
Basophils Absolute: 0.1 10*3/uL (ref 0.0–0.1)
Basophils Relative: 1 %
Eosinophils Absolute: 0.3 10*3/uL (ref 0.0–0.7)
Eosinophils Relative: 3 %
HCT: 42.5 % (ref 39.0–52.0)
Hemoglobin: 14.9 g/dL (ref 13.0–17.0)
Lymphocytes Relative: 19 %
Lymphs Abs: 1.6 10*3/uL (ref 0.7–4.0)
MCH: 33.3 pg (ref 26.0–34.0)
MCHC: 35.1 g/dL (ref 30.0–36.0)
MCV: 94.9 fL (ref 78.0–100.0)
Monocytes Absolute: 1 10*3/uL (ref 0.1–1.0)
Monocytes Relative: 11 %
Neutro Abs: 5.9 10*3/uL (ref 1.7–7.7)
Neutrophils Relative %: 66 %
Platelets: 217 10*3/uL (ref 150–400)
RBC: 4.48 MIL/uL (ref 4.22–5.81)
RDW: 13.8 % (ref 11.5–15.5)
WBC: 8.8 10*3/uL (ref 4.0–10.5)

## 2016-08-25 LAB — BASIC METABOLIC PANEL
Anion gap: 10 (ref 5–15)
BUN: 11 mg/dL (ref 6–20)
CO2: 27 mmol/L (ref 22–32)
Calcium: 9.2 mg/dL (ref 8.9–10.3)
Chloride: 106 mmol/L (ref 101–111)
Creatinine, Ser: 0.69 mg/dL (ref 0.61–1.24)
GFR calc Af Amer: >60 mL/min (ref >60)
GFR calc non Af Amer: >60 mL/min (ref >60)
Glucose, Bld: 85 mg/dL (ref 65–99)
Potassium: 3.5 mmol/L (ref 3.5–5.1)
Sodium: 143 mmol/L (ref 135–145)

## 2016-08-25 LAB — BRAIN NATRIURETIC PEPTIDE: B Natriuretic Peptide: 21.6 pg/mL (ref 0.0–100.0)

## 2016-08-25 LAB — CBG MONITORING, ED: GLUCOSE-CAPILLARY: 125 mg/dL — AB (ref 65–99)

## 2016-08-25 LAB — TROPONIN I: Troponin I: <0.03 ng/mL (ref <0.03)

## 2016-08-25 MED ORDER — SODIUM CHLORIDE 0.9% FLUSH
3.0000 mL | Freq: Two times a day (BID) | INTRAVENOUS | Status: DC
  Administered 2016-08-25 – 2016-08-26 (×3): 3 mL via INTRAVENOUS

## 2016-08-25 MED ORDER — ORAL CARE MOUTH RINSE
15.0000 mL | Freq: Two times a day (BID) | OROMUCOSAL | Status: DC
  Administered 2016-08-26: 15 mL via OROMUCOSAL

## 2016-08-25 MED ORDER — HYDROCODONE-ACETAMINOPHEN 5-325 MG PO TABS
1.0000 | ORAL_TABLET | Freq: Four times a day (QID) | ORAL | Status: DC | PRN
  Administered 2016-08-25 – 2016-08-26 (×2): 1 via ORAL
  Filled 2016-08-25 (×2): qty 1

## 2016-08-25 MED ORDER — PROCHLORPERAZINE MALEATE 10 MG PO TABS: 10.0000 mg | ORAL_TABLET | Freq: Four times a day (QID) | ORAL | Status: DC | PRN

## 2016-08-25 MED ORDER — TRAMADOL HCL 50 MG PO TABS: 50.0000 mg | ORAL_TABLET | Freq: Four times a day (QID) | ORAL | Status: DC | PRN

## 2016-08-25 MED ORDER — IOPAMIDOL (ISOVUE-370) INJECTION 76%
100.0000 mL | Freq: Once | INTRAVENOUS | Status: AC | PRN
  Administered 2016-08-25: 57.7 mL via INTRAVENOUS

## 2016-08-25 MED ORDER — ENOXAPARIN SODIUM 80 MG/0.8ML ~~LOC~~ SOLN
65.0000 mg | Freq: Two times a day (BID) | SUBCUTANEOUS | Status: DC
  Administered 2016-08-26 – 2016-08-27 (×4): 65 mg via SUBCUTANEOUS
  Filled 2016-08-25 (×4): qty 0.8

## 2016-08-25 MED ORDER — INSULIN ASPART 100 UNIT/ML ~~LOC~~ SOLN
0.0000 [IU] | Freq: Three times a day (TID) | SUBCUTANEOUS | Status: DC
Start: 2016-08-25 — End: 2016-08-27
  Administered 2016-08-25 – 2016-08-27 (×3): 1 [IU] via SUBCUTANEOUS

## 2016-08-25 MED ORDER — CHLORHEXIDINE GLUCONATE 0.12 % MT SOLN
15.0000 mL | Freq: Two times a day (BID) | OROMUCOSAL | Status: DC
  Administered 2016-08-25 – 2016-08-27 (×4): 15 mL via OROMUCOSAL
  Filled 2016-08-25 (×4): qty 15

## 2016-08-25 MED ORDER — MAGNESIUM HYDROXIDE 400 MG/5ML PO SUSP
30.0000 mL | Freq: Every day | ORAL | Status: DC | PRN
  Administered 2016-08-27: 30 mL via ORAL
  Filled 2016-08-25 (×2): qty 30

## 2016-08-25 MED ORDER — SENNOSIDES-DOCUSATE SODIUM 8.6-50 MG PO TABS
2.0000 | ORAL_TABLET | Freq: Every day | ORAL | Status: DC
  Administered 2016-08-26 – 2016-08-27 (×2): 2 via ORAL
  Filled 2016-08-25 (×2): qty 2

## 2016-08-25 MED ORDER — AMLODIPINE BESYLATE 10 MG PO TABS
10.0000 mg | ORAL_TABLET | Freq: Every day | ORAL | Status: DC
  Administered 2016-08-26 – 2016-08-27 (×2): 10 mg via ORAL
  Filled 2016-08-25 (×2): qty 1

## 2016-08-25 MED ORDER — ENSURE ENLIVE PO LIQD
237.0000 mL | Freq: Two times a day (BID) | ORAL | Status: DC
  Administered 2016-08-26 – 2016-08-27 (×3): 237 mL via ORAL

## 2016-08-25 MED ORDER — ASPIRIN EC 81 MG PO TBEC
81.0000 mg | DELAYED_RELEASE_TABLET | Freq: Every day | ORAL | Status: DC
  Administered 2016-08-26: 81 mg via ORAL
  Filled 2016-08-25: qty 1

## 2016-08-25 MED ORDER — ENOXAPARIN SODIUM 80 MG/0.8ML ~~LOC~~ SOLN
1.0000 mg/kg | Freq: Once | SUBCUTANEOUS | Status: AC
  Administered 2016-08-25: 65 mg via SUBCUTANEOUS
  Filled 2016-08-25: qty 0.8

## 2016-08-25 NOTE — ED Provider Notes (Signed)
[facility] DEPT Provider Note   CSN: [id number] Arrival date & time: 08/25/16  1034  By signing my name below, I, Rayna Sexton, attest that this documentation has been prepared under the direction and in the presence of Virgel Manifold, MD. Electronically Signed: Rayna Sexton, ED Scribe. 08/25/16. 12:10 PM.   History   Chief Complaint Chief Complaint  Patient presents with  . Shortness of Breath  . Leg Swelling    HPI HPI Comments: Omar Peters is a 81 y.o. male with a h/o lung CA, bone CA and HTN who presents to the Emergency Department complaining of constant, moderate, SOB x 3 days. Pt states his symptoms are worse at night while attempting to sleep noting that when lying flat he will "gasp for air" but notes his SOB slightly alleviates when sitting upright. His SOB also worsens with exertion and states this is abnormal for him. His wife states that he has a productive cough and has a "spit cup" in every room of the house. He has experienced mild swelling to his RLE which they have elevated and applied ice to which has provided mild relief. Pt also reports diffuse, moderate, right lower leg pain which began after lifting his leg to get dressed. He denies a h/o DVT/PE and CHF but his wife states his bone CA has metastasized to his right hip. He denies any known bleeding, melanotic stools and bloody stools.   The history is provided by the patient and the spouse. No language interpreter was used.    Past Medical History:  Diagnosis Date  . Anxiety    due to diagnosis of Stage 4 Lung Cancer  . Bone cancer ([place])    non small cell lung with mets to bone  . Diabetes mellitus without complication ([place])   . GERD (gastroesophageal reflux disease)   . Hypertension   . Lung cancer ([place])    stage IV non small cell lung cancer dx. January 2016   . Macular degeneration of right eye   . Shortness of breath dyspnea    01/14/15 at night and has to sit up    Patient Active Problem  List   Diagnosis Date Noted  . Port catheter in place 07/05/2016  . Hypokalemia 12/01/2015  . Aphasia 12/01/2015  . Acute encephalopathy 12/01/2015  . Encounter for antineoplastic immunotherapy 07/07/2015  . Weight loss 04/07/2015  . Altered level of consciousness 04/07/2015  . Orthostatic hypotension 04/07/2015  . Antineoplastic chemotherapy induced anemia 04/07/2015  . Anorexia 03/10/2015  . Nausea with vomiting 03/10/2015  . Constipation 03/10/2015  . Pleural effusion 03/10/2015  . Fall 03/10/2015  . Dehydration 03/10/2015  . Weakness 03/10/2015  . Hypoalbuminemia 03/10/2015  . Hyperglycemia 03/03/2015  . Hypertension 01/02/2015  . Bone metastasis ([place]) 12/31/2014  . Cancer of upper lobe of right lung ([place]) 12/31/2014  . Cough 12/29/2014  . Shortness of breath 12/29/2014  . Cavitating mass in right upper lung lobe 12/21/2014    Past Surgical History:  Procedure Laterality Date  . CHEST TUBE INSERTION Right 01/15/2015   Procedure: INSERTION PLEURAL DRAINAGE CATHETER WITH ULTRASOUND AND FLURO GUIDANCE;  Surgeon: Grace Isaac, MD;  Location: [facility];  Service: Thoracic;  Laterality: Right;  . REMOVAL OF PLEURAL DRAINAGE CATHETER Right 03/26/2015   Procedure: REMOVAL OF PLEURAL DRAINAGE CATHETER;  Surgeon: Grace Isaac, MD;  Location: [facility];  Service: Thoracic;  Laterality: Right;  . [place] Medications  Prior to Admission medications   Medication Sig Start Date End Date Taking? Authorizing Provider  amitriptyline (ELAVIL) 10 MG tablet Take 1 tablet (10 mg total) by mouth at bedtime. 05/10/16   Owens Shark, NP  amLODipine (NORVASC) 10 MG tablet Take 10 mg by mouth daily.    Historical Provider, MD  aspirin 81 MG tablet Take 81 mg by mouth daily.    Historical Provider, MD  B Complex-Biotin-FA (B-COMPLEX PO) Take 1 each by mouth daily. Reported on 03/01/2016    Historical Provider, MD  HYDROcodone-acetaminophen (NORCO) 5-325 MG  per tablet Take 1 tablet by mouth every 6 (six) hours as needed for moderate pain. Patient not taking: Reported on 07/05/2016 01/15/15   Grace Isaac, MD  lidocaine-prilocaine (EMLA) cream Apply 1 application topically as needed. Pt is to apply cream 1 hour before receiving chemo 06/21/16   Curt Bears, MD  magnesium hydroxide (MILK OF MAGNESIA) 400 MG/5ML suspension Take 30 mLs by mouth daily as needed for mild constipation.    Historical Provider, MD  metFORMIN (GLUCOPHAGE) 500 MG tablet Take 500 mg by mouth daily. 12/17/15   Historical Provider, MD  mirtazapine (REMERON) 30 MG tablet Take 1 tablet (30 mg total) by mouth at bedtime. 08/15/16   Curt Bears, MD  Naproxen Sodium (ALEVE PO) Take 1 tablet by mouth daily as needed. Reported on 04/11/2016    Historical Provider, MD  prochlorperazine (COMPAZINE) 10 MG tablet Take 1 tablet (10 mg total) by mouth every 6 (six) hours as needed for nausea or vomiting. Patient not taking: Reported on 07/05/2016 05/16/16   Curt Bears, MD  ranitidine (ZANTAC) 150 MG tablet Take 150 mg by mouth daily.    Historical Provider, MD  senna-docusate (SENNA S) 8.6-50 MG per tablet Take 2 tablets by mouth daily.    Historical Provider, MD  traMADol (ULTRAM) 50 MG tablet Take 1 tablet (50 mg total) by mouth every 6 (six) hours as needed. 08/16/16   Curt Bears, MD    Family History Family History  Problem Relation Age of Onset  . Stroke Mother   . Cancer Father     pancreatic  . Cancer Brother     lung  . Cancer Brother     lung    Social History Social History  Substance Use Topics  . Smoking status: Former Smoker    Packs/day: 1.00    Years: 50.00    Quit date: 12/14/2014  . Smokeless tobacco: Former Systems developer    Quit date: 12/14/2014     Comment: 4-5 cig day  . Alcohol use 4.2 oz/week    7 Shots of liquor per week     Comment: 2-3  beers day     Allergies   Amitriptyline and Marinol [dronabinol]   Review of Systems Review of  Systems  Constitutional: Positive for activity change and fatigue.  Respiratory: Positive for cough and shortness of breath.   Cardiovascular: Positive for leg swelling.  Gastrointestinal: Negative for anal bleeding and blood in stool.  Musculoskeletal: Positive for arthralgias and myalgias.  All other systems reviewed and are negative.  Physical Exam Updated Vital Signs BP 138/75 (BP Location: Right Arm)   Pulse 96   Temp 97.8 F (36.6 C) (Oral)   Resp 20   SpO2 97%   Physical Exam  Constitutional: He is oriented to person, place, and time. He appears well-developed and well-nourished.  HENT:  Head: Normocephalic and atraumatic.  Eyes: EOM are normal.  Neck: Normal range  of motion.  Cardiovascular: Normal rate, regular rhythm and normal heart sounds.   No murmur heard. Pulmonary/Chest: Effort normal and breath sounds normal. No respiratory distress. He has no wheezes. He has no rales.  Abdominal: Soft. He exhibits no distension. There is no tenderness.  Musculoskeletal: Normal range of motion. He exhibits edema.  Pitting asymmetric RLE edema. No calf tenderness.   Neurological: He is alert and oriented to person, place, and time.  Skin: Skin is warm and dry.  Psychiatric: He has a normal mood and affect. Judgment normal.  Nursing note and vitals reviewed.  ED Treatments / Results  Labs (all labs ordered are listed, but only abnormal results are displayed) Labs Reviewed  CBC WITH DIFFERENTIAL/PLATELET  BASIC METABOLIC PANEL  BRAIN NATRIURETIC PEPTIDE  TROPONIN I    EKG  EKG Interpretation None       Radiology Dg Chest 2 View  Result Date: 08/25/2016 CLINICAL DATA:  Productive cough, shortness of breath. EXAM: CHEST  2 VIEW COMPARISON:  Radiographs of April 12, 2015. CT scan of July 12, 2016. FINDINGS: Stable cardiomediastinal silhouette. Interval placement of right internal jugular Port-A-Cath with distal tip in expected position of cavoatrial junction. No  pneumothorax is noted. Mild left basilar subsegmental atelectasis is noted. Stable mild loculated right pleural effusion is noted. Right basilar opacity is noted concerning for atelectasis, edema, inflammation or possibly scarring. Bony thorax is unremarkable. IMPRESSION: Mild left basilar subsegmental atelectasis. Interval placement of right internal jugular Port-A-Cath. Stable mild loculated right pleural effusion is noted. Probable stable right basilar opacity is noted as described above. Electronically Signed   By: Marijo Conception, M.D.   On: 08/25/2016 11:35   Ct Angio Chest Pe W And/or Wo Contrast  Result Date: 08/25/2016 CLINICAL DATA:  Metastatic lung cancer.  Shortness of breath. EXAM: CT ANGIOGRAPHY CHEST WITH CONTRAST TECHNIQUE: Multidetector CT imaging of the chest was performed using the standard protocol during bolus administration of intravenous contrast. Multiplanar CT image reconstructions and MIPs were obtained to evaluate the vascular anatomy. CONTRAST:  57 cc of Isovue 370 COMPARISON:  07/12/2016 RECIST 1.1 Target Lesions: 1. Mass-like area of architectural distortion in the medial aspect of the right upper lobe measures 6.6 x 3.3 cm (image 27 of series 4) previously 6.6 x 3.9 cm Non-target Lesions: 1.  7 mm short axis AP window lymph node (image 34of series 4), stable. 2. 8 mm short axis subcarinal lymph node (image 40 of series 4), previously 9 mm. 3. 11 mm short axis low right paratracheal lymph node (image 35 of series 4), stable. 5. Small chronic thick-walled rim enhancing right pleural effusion, Slightly increased in volume from previous exam, image 60 of series 4. 6. Left lower lobe pulmonary nodularity not visualized. 7. Right glenoid metastasis, present, image 18 is series 4 FINDINGS: Mediastinum: Normal heart size. No pericardial effusion. Aortic atherosclerosis noted. Calcification within the LAD and RCA coronary artery noted. The trachea appears patent and midline. Mild diffuse  wall thickening involving the esophagus. The main pulmonary artery is patent. There is abnormal filling defect within the left dx lower lobe pulmonary artery, image 120 of series 10. Lungs/Pleura: New moderate left pleural effusion with overlying compressive type atelectasis and consolidation. There has been slight increase in volume of loculated effusion overlying the posterior right lower lobe. There is progressive soft tissue consolidation and interstitial thickening involving the right mid and lower lung zone. Similar appearance of masslike consolidation overlying the right lung apex. Upper Abdomen: No focal liver  abnormality. The liver has a nodular contour or and there is hypertrophy of the caudate lobe and lateral segment of left lobe. No adrenal abnormality identified. The visualized portions of the pancreas and spleen are normal. Musculoskeletal: Her mid to metastasis involving the right glenoid and scapula is again identified. Subjectively, there appears to be progression of this lesion with further loss of bone. Expansile lytic lesion involving the lateral aspect of the right 6 rib is identified with surrounding soft tissue component measures 3.4 by 2.3 cm, image 56 of series 4. On the previous exam this measured 2.1 x 3.4 cm. Lytic lesion involving the pedicle of T11 measures 3.7 cm, image 89 of series 4. Previously 3.5 cm. a left-sided canal involvement is suspected. Cannot rule out spinal stenosis at this level. Review of the MIP images confirms the above findings. IMPRESSION: 1. Examination is positive for acute pulmonary embolus to the left lower lobe. 2. New moderate volume left pleural effusion and slight increase in volume of loculated right effusion. 3. Worsening aeration to the right lung. 4. Mild progression of multi focal lytic bone metastasis 5. Aortic atherosclerosis and coronary artery disease. Critical Value/emergent results were called by telephone at the time of interpretation on  08/25/2016 at 2:12 pm to Dr. Virgel Manifold , who verbally acknowledged these results. Electronically Signed   By: Kerby Moors M.D.   On: 08/25/2016 14:12    Procedures Procedures   CRITICAL CARE Performed by: Virgel Manifold Total critical care time: 35 minutes Critical care time was exclusive of separately billable procedures and treating other patients. Critical care was necessary to treat or prevent imminent or life-threatening deterioration. Critical care was time spent personally by me on the following activities: development of treatment plan with patient and/or surrogate as well as nursing, discussions with consultants, evaluation of patient's response to treatment, examination of patient, obtaining history from patient or surrogate, ordering and performing treatments and interventions, ordering and review of laboratory studies, ordering and review of radiographic studies, pulse oximetry and re-evaluation of patient's condition.  DIAGNOSTIC STUDIES: Oxygen Saturation is 97% on [place] 2L/min, normal by my interpretation.    COORDINATION OF CARE: 11:49 AM Discussed next steps with pt. Pt verbalized understanding and is agreeable with the plan.    Medications Ordered in ED Medications  iopamidol (ISOVUE-370) 76 % injection 100 mL (57.7 mLs Intravenous Contrast Given 08/25/16 1338)  enoxaparin (LOVENOX) injection 65 mg (65 mg Subcutaneous Given 08/25/16 1459)  potassium chloride SA (K-DUR,KLOR-CON) CR tablet 40 mEq (40 mEq Oral Given 08/26/16 1421)  heparin lock flush 100 unit/mL (500 Units Intracatheter Given 08/27/16 1520)     Initial Impression / Assessment and Plan / ED Course  I have reviewed the triage vital signs and the nursing notes.  Pertinent labs & imaging results that were available during my care of the patient were reviewed by me and considered in my medical decision making (see chart for details).  Clinical Course    80yM with increasing dyspnea. PE noted on CT. Probably  DVT with asymmetric RLE swelling. Heparin. Admit.   I personally preformed the services scribed in my presence. The recorded information has been reviewed is accurate. Virgel Manifold, MD.  Final Clinical Impressions(s) / ED Diagnoses   Final diagnoses:  Other acute pulmonary embolism with acute cor pulmonale [facility])    New Prescriptions New Prescriptions   No medications on file     Virgel Manifold, MD 08/30/16 1439

## 2016-08-25 NOTE — ED Triage Notes (Signed)
Pt complaining of shortness of breath X 2 days, right lower extremity swelling X 7 days, and pain in left rib cage/LUQ radiating in the back X 2 months.  Pt rates pain at 8 (0-10 scale).  States SOB increases with lying down.  Hx of lung and bone cancer.  Last chemo and radiation several months ago.

## 2016-08-25 NOTE — ED Notes (Signed)
Pt reports feeling weak all over.

## 2016-08-25 NOTE — ED Notes (Addendum)
Per chart review, Pt received chemo (INV-nivolumab BMS CA [id number]) on 8/30.  Medication class verified by [employer].

## 2016-08-25 NOTE — Telephone Encounter (Signed)
BMS 370: Patient's spouse left message this morning on my machine informing me that patient has been having trouble breathing and "gasping for breath" and that his right leg is swollen.   Return call to spouse as soon as I got the message. Asked spouse did they call triage first, per spouse they did and were waiting to hear back from them regarding an appointment to possibly see someone today. Spouse states patient has been refusing to call clinic to report symptoms until today. I inquired with spouse as to whether patient was still having trouble breathing, and she states he is asleep right now and resting with no issues. Spouse states that patient has been having SOB and "gasping for breath" since Wednesday 08/23/16.I spoke with patient and he did not sound as to be having any difficulty with talking and did not sound breathless. Patient states that he is "gasping for breath" at night when laying down to sleep and has to sleep in his recliner otherwise he can't sleep. Patient reports SOB is not continuous, but occasional. Also reporting to be having some swelling in his right leg from the "knee down to toes" which started on 08/18/16, per wife patient has been elevating his leg and has been applying ice to it. Wife states that patient feels he might have pulled a muscle while trying to put his trousers on. Patient  Denies fever or other symptoms when asked. Reports pain to left flank "not bad."  Call to Dr. Worthy Flank desk nurse, Dr. Julien Nordmann is not in clinic today and no AP in symptom management today. Return call to patient and spouse and informed them that I am not able to get an appointment for him at clinic and that it would be best for patient to go to ED for a thorough evaluation of his symptoms. Spouse in agreement and states they will be on their way to [facility] ED shortly.

## 2016-08-25 NOTE — Progress Notes (Signed)
ANTICOAGULATION CONSULT NOTE - Initial Consult  Pharmacy Consult for Lovenox Indication: pulmonary embolus  Allergies  Allergen Reactions  . Marinol [Dronabinol] Other (See Comments)    Confusion He does not take this any more   Patient Measurements:   Heparin Dosing Weight: 64.1kg  Vital Signs: Temp: 97.8 F (36.6 C) (09/08 1058) Temp Source: Oral (09/08 1058) BP: 122/61 (09/08 1610) Pulse Rate: 106 (09/08 1610)  Labs:  Recent Labs  08/25/16 1313  HGB 14.9  HCT 42.5  PLT 217  CREATININE 0.69  TROPONINI <0.03   Estimated Creatinine Clearance: 68.7 mL/min (by C-G formula based on SCr of 0.8 mg/dL).  Medical History: Past Medical History:  Diagnosis Date  . Anxiety    due to diagnosis of Stage 4 Lung Cancer  . Bone cancer ([place])    non small cell lung with mets to bone  . Diabetes mellitus without complication ([place])   . GERD (gastroesophageal reflux disease)   . Hypertension   . Lung cancer ([place])    stage IV non small cell lung cancer dx. January 2016   . Macular degeneration of right eye   . Shortness of breath dyspnea    01/14/15 at night and has to sit up   Medications:  Scheduled:  . [START ON 08/26/2016] amLODipine  10 mg Oral Daily  . [START ON 08/26/2016] aspirin EC  81 mg Oral Daily  . enoxaparin (LOVENOX) injection  65 mg Subcutaneous BID  . [START ON 08/26/2016] feeding supplement (ENSURE ENLIVE)  237 mL Oral BID BM  . insulin aspart  0-9 Units Subcutaneous TID WC  . [START ON 08/26/2016] senna-docusate  2 tablet Oral Daily  . sodium chloride flush  3 mL Intravenous Q12H    Assessment: 80 yoM to ED 9/8 with  [facility], RLE swelling since 9/6, also L sided chest pain x 2 months - but worsened last 3-4 days.  - Lovenox for PE per chest CT Stage IV NSCLC, bone mets; Nivolumab clinical trial q14days. Complaint of chest pain at [place] office visit 8/30 - started Tramadol.  Goal of Therapy:  Anti-Xa level 0.6-1 units/ml 4hrs after LMWH dose given Monitor platelets  by anticoagulation protocol: Yes   Plan:   Lovenox '65mg'$  bid begun in ED  Current chemotherapy with Nivolumab clinical trial, await decision to continue Lovenox or use DOAC  If Lovenox continued could give 1.'5mg'$ /kg q24 = '100mg'$  daily  Minda Ditto PharmD Pager [phone] 08/25/2016, 5:58 PM

## 2016-08-25 NOTE — H&P (Signed)
History and Physical    Omar Peters KGU:[id number] DOB: 24-Apr-1935 DOA: 08/25/2016  PCP: Tamsen Roers, MD  Outpatient Specialists: Oncology, Dr. Earlie Server Patient coming from: home  Chief Complaint: Weakness and shortness of breath  HPI: Omar Peters is a 81 y.o. male with medical history significant of stage IV lung cancer, diabetes, hypertension, presents to the emergency room with a chief complaint of shortness of breath and left-sided chest pain. Patient states that he has been having chest pain for the past couple of months, however in the last 3-4 days patient has gotten worse. States that it is getting worse when taking a deep breath. He also is complaining of shortness of breath for quite sometime however in the last few days has gotten a lot worse. He also complains of productive cough. He has been noticing swelling in his right lower extremity for the past week. He denies any palpitations. He denies any abdominal pain, no nausea, vomiting or diarrhea. He is currently getting chemotherapy  ED Course: In the ED the vital signs are stable other than mild sinus tachycardia, found to be hypoxic, his blood work is essentially unremarkable. He underwent a CT angios chest which showed acute pulmonary embolism to the left lower lobe, moderate left pleural effusion which is new, slight increase of the right effusion as well as progression of the bone metastasis. TRH was asked for admission for acute PE. Patient was given a dose of subcutaneous Lovenox in the emergency room.  Review of Systems: As per HPI otherwise 10 point review of systems negative.   Past Medical History:  Diagnosis Date  . Anxiety    due to diagnosis of Stage 4 Lung Cancer  . Bone cancer ([place])    non small cell lung with mets to bone  . Diabetes mellitus without complication ([place])   . GERD (gastroesophageal reflux disease)   . Hypertension   . Lung cancer ([place])    stage IV non small cell lung cancer dx. January  2016   . Macular degeneration of right eye   . Shortness of breath dyspnea    01/14/15 at night and has to sit up    Past Surgical History:  Procedure Laterality Date  . CHEST TUBE INSERTION Right 01/15/2015   Procedure: INSERTION PLEURAL DRAINAGE CATHETER WITH ULTRASOUND AND FLURO GUIDANCE;  Surgeon: Grace Isaac, MD;  Location: [facility];  Service: Thoracic;  Laterality: Right;  . REMOVAL OF PLEURAL DRAINAGE CATHETER Right 03/26/2015   Procedure: REMOVAL OF PLEURAL DRAINAGE CATHETER;  Surgeon: Grace Isaac, MD;  Location: [facility];  Service: Thoracic;  Laterality: Right;  . [place]     reports that he quit smoking about 20 months ago. He has a 50.00 pack-year smoking history. He quit smokeless tobacco use about 20 months ago. He reports that he drinks about 4.2 oz of alcohol per week . He reports that he does not use drugs.  Allergies  Allergen Reactions  . Marinol [Dronabinol] Other (See Comments)    Confusion He does not take this any more    Family History  Problem Relation Age of Onset  . Stroke Mother   . Cancer Father     pancreatic  . Cancer Brother     lung  . Cancer Brother     lung    Prior to Admission medications   Medication Sig Start Date End Date Taking? Authorizing Provider  amLODipine (NORVASC) 10 MG tablet Take 10 mg by  mouth daily.   Yes Historical Provider, MD  aspirin 81 MG tablet Take 81 mg by mouth daily.   Yes Historical Provider, MD  B Complex-Biotin-FA (B-COMPLEX PO) Take 1 each by mouth daily. Reported on 03/01/2016   Yes Historical Provider, MD  HYDROcodone-acetaminophen (NORCO) 5-325 MG per tablet Take 1 tablet by mouth every 6 (six) hours as needed for moderate pain. 01/15/15  Yes Grace Isaac, MD  lidocaine-prilocaine (EMLA) cream Apply 1 application topically as needed. Pt is to apply cream 1 hour before receiving chemo 06/21/16  Yes Curt Bears, MD  magnesium hydroxide (MILK OF MAGNESIA) 400 MG/5ML suspension  Take 30 mLs by mouth daily as needed for mild constipation.   Yes Historical Provider, MD  metFORMIN (GLUCOPHAGE) 500 MG tablet Take 500 mg by mouth daily. 12/17/15  Yes Historical Provider, MD  [place] 1 each into the right eye every 8 (eight) weeks. Eye injection.   Yes Historical Provider, MD  prochlorperazine (COMPAZINE) 10 MG tablet Take 1 tablet (10 mg total) by mouth every 6 (six) hours as needed for nausea or vomiting. 05/16/16  Yes Curt Bears, MD  ranitidine (ZANTAC) 150 MG tablet Take 150 mg by mouth daily.   Yes Historical Provider, MD  senna-docusate (SENNA S) 8.6-50 MG per tablet Take 2 tablets by mouth daily.   Yes Historical Provider, MD  traMADol (ULTRAM) 50 MG tablet Take 1 tablet (50 mg total) by mouth every 6 (six) hours as needed. Patient taking differently: Take 50 mg by mouth every 6 (six) hours as needed for moderate pain.  08/16/16  Yes Curt Bears, MD  amitriptyline (ELAVIL) 10 MG tablet Take 1 tablet (10 mg total) by mouth at bedtime. Patient not taking: Reported on 08/25/2016 05/10/16   Owens Shark, NP  mirtazapine (REMERON) 30 MG tablet Take 1 tablet (30 mg total) by mouth at bedtime. Patient not taking: Reported on 08/25/2016 08/15/16   Curt Bears, MD    Physical Exam: Vitals:   08/25/16 1432 08/25/16 1530 08/25/16 1535 08/25/16 1610  BP: 137/71 128/66 128/66 122/61  Pulse: 105 106 105 106  Resp: 19 16 (!) 28 25  Temp:      TempSrc:      SpO2: 95% 96% 95% 95%      Constitutional: NAD, calm, comfortable, nasal cannula on, flushed face Vitals:   08/25/16 1432 08/25/16 1530 08/25/16 1535 08/25/16 1610  BP: 137/71 128/66 128/66 122/61  Pulse: 105 106 105 106  Resp: 19 16 (!) 28 25  Temp:      TempSrc:      SpO2: 95% 96% 95% 95%   Eyes: PERRL, lids and conjunctivae normal ENMT: Mucous membranes are moist. Posterior pharynx clear of any exudate or lesions.  Neck: normal, supple, no masses, no thyromegaly Respiratory: clear  to auscultation bilaterally, no wheezing, no crackles. Overall distant breath sounds Cardiovascular: Regular rate and rhythm, no murmurs / rubs / gallops. No extremity edema. 2+ pedal pulses. Tachycardia Abdomen: no tenderness, no masses palpated. Bowel sounds positive.  Musculoskeletal: no clubbing / cyanosis. Normal muscle tone. Right lower extremity greater than left lower extremity Skin: no rashes, lesions, ulcers. No induration Neurologic: CN 2-12 grossly intact. Strength 5/5 in all 4.  Psychiatric: Normal judgment and insight. Alert and oriented x 3. Normal mood.   Labs on Admission: I have personally reviewed following labs and imaging studies  CBC:  Recent Labs Lab 08/25/16 1313  WBC 8.8  NEUTROABS 5.9  HGB 14.9  HCT  42.5  MCV 94.9  PLT 161   Basic Metabolic Panel:  Recent Labs Lab 08/25/16 1313  NA 143  K 3.5  CL 106  CO2 27  GLUCOSE 85  BUN 11  CREATININE 0.69  CALCIUM 9.2   GFR: Estimated Creatinine Clearance: 68.7 mL/min (by C-G formula based on SCr of 0.8 mg/dL). Liver Function Tests: No results for input(s): AST, ALT, ALKPHOS, BILITOT, PROT, ALBUMIN in the last 168 hours. No results for input(s): LIPASE, AMYLASE in the last 168 hours. No results for input(s): AMMONIA in the last 168 hours. Coagulation Profile: No results for input(s): INR, PROTIME in the last 168 hours. Cardiac Enzymes:  Recent Labs Lab 08/25/16 1313  TROPONINI <0.03   BNP (last 3 results) No results for input(s): PROBNP in the last 8760 hours. HbA1C: No results for input(s): HGBA1C in the last 72 hours. CBG: No results for input(s): GLUCAP in the last 168 hours. Lipid Profile: No results for input(s): CHOL, HDL, LDLCALC, TRIG, CHOLHDL, LDLDIRECT in the last 72 hours. Thyroid Function Tests: No results for input(s): TSH, T4TOTAL, FREET4, T3FREE, THYROIDAB in the last 72 hours. Anemia Panel: No results for input(s): VITAMINB12, FOLATE, FERRITIN, TIBC, IRON, RETICCTPCT in the  last 72 hours. Urine analysis:    Component Value Date/Time   COLORURINE YELLOW 12/01/2015 2022   APPEARANCEUR CLEAR 12/01/2015 2022   LABSPEC 1.026 12/01/2015 2022   PHURINE 8.0 12/01/2015 2022   GLUCOSEU NEGATIVE 12/01/2015 2022   HGBUR NEGATIVE 12/01/2015 2022   BILIRUBINUR NEGATIVE 12/01/2015 2022   KETONESUR NEGATIVE 12/01/2015 2022   PROTEINUR NEGATIVE 12/01/2015 2022   NITRITE NEGATIVE 12/01/2015 2022   LEUKOCYTESUR NEGATIVE 12/01/2015 2022   Sepsis Labs: '@LABRCNTIP'$ (procalcitonin:4,lacticidven:4) )No results found for this or any previous visit (from the past 240 hour(s)).   Radiological Exams on Admission: Dg Chest 2 View  Result Date: 08/25/2016 CLINICAL DATA:  Productive cough, shortness of breath. EXAM: CHEST  2 VIEW COMPARISON:  Radiographs of April 12, 2015. CT scan of July 12, 2016. FINDINGS: Stable cardiomediastinal silhouette. Interval placement of right internal jugular Port-A-Cath with distal tip in expected position of cavoatrial junction. No pneumothorax is noted. Mild left basilar subsegmental atelectasis is noted. Stable mild loculated right pleural effusion is noted. Right basilar opacity is noted concerning for atelectasis, edema, inflammation or possibly scarring. Bony thorax is unremarkable. IMPRESSION: Mild left basilar subsegmental atelectasis. Interval placement of right internal jugular Port-A-Cath. Stable mild loculated right pleural effusion is noted. Probable stable right basilar opacity is noted as described above. Electronically Signed   By: Marijo Conception, M.D.   On: 08/25/2016 11:35   Ct Angio Chest Pe W And/or Wo Contrast  Result Date: 08/25/2016 CLINICAL DATA:  Metastatic lung cancer.  Shortness of breath. EXAM: CT ANGIOGRAPHY CHEST WITH CONTRAST TECHNIQUE: Multidetector CT imaging of the chest was performed using the standard protocol during bolus administration of intravenous contrast. Multiplanar CT image reconstructions and MIPs were obtained to  evaluate the vascular anatomy. CONTRAST:  57 cc of Isovue 370 COMPARISON:  07/12/2016 RECIST 1.1 Target Lesions: 1. Mass-like area of architectural distortion in the medial aspect of the right upper lobe measures 6.6 x 3.3 cm (image 27 of series 4) previously 6.6 x 3.9 cm Non-target Lesions: 1.  7 mm short axis AP window lymph node (image 34of series 4), stable. 2. 8 mm short axis subcarinal lymph node (image 40 of series 4), previously 9 mm. 3. 11 mm short axis low right paratracheal lymph node (image 35 of  series 4), stable. 5. Small chronic thick-walled rim enhancing right pleural effusion, Slightly increased in volume from previous exam, image 60 of series 4. 6. Left lower lobe pulmonary nodularity not visualized. 7. Right glenoid metastasis, present, image 18 is series 4 FINDINGS: Mediastinum: Normal heart size. No pericardial effusion. Aortic atherosclerosis noted. Calcification within the LAD and RCA coronary artery noted. The trachea appears patent and midline. Mild diffuse wall thickening involving the esophagus. The main pulmonary artery is patent. There is abnormal filling defect within the left dx lower lobe pulmonary artery, image 120 of series 10. Lungs/Pleura: New moderate left pleural effusion with overlying compressive type atelectasis and consolidation. There has been slight increase in volume of loculated effusion overlying the posterior right lower lobe. There is progressive soft tissue consolidation and interstitial thickening involving the right mid and lower lung zone. Similar appearance of masslike consolidation overlying the right lung apex. Upper Abdomen: No focal liver abnormality. The liver has a nodular contour or and there is hypertrophy of the caudate lobe and lateral segment of left lobe. No adrenal abnormality identified. The visualized portions of the pancreas and spleen are normal. Musculoskeletal: Her mid to metastasis involving the right glenoid and scapula is again identified.  Subjectively, there appears to be progression of this lesion with further loss of bone. Expansile lytic lesion involving the lateral aspect of the right 6 rib is identified with surrounding soft tissue component measures 3.4 by 2.3 cm, image 56 of series 4. On the previous exam this measured 2.1 x 3.4 cm. Lytic lesion involving the pedicle of T11 measures 3.7 cm, image 89 of series 4. Previously 3.5 cm. a left-sided canal involvement is suspected. Cannot rule out spinal stenosis at this level. Review of the MIP images confirms the above findings. IMPRESSION: 1. Examination is positive for acute pulmonary embolus to the left lower lobe. 2. New moderate volume left pleural effusion and slight increase in volume of loculated right effusion. 3. Worsening aeration to the right lung. 4. Mild progression of multi focal lytic bone metastasis 5. Aortic atherosclerosis and coronary artery disease. Critical Value/emergent results were called by telephone at the time of interpretation on 08/25/2016 at 2:12 pm to Dr. Virgel Manifold , who verbally acknowledged these results. Electronically Signed   By: Kerby Moors M.D.   On: 08/25/2016 14:12    EKG: Independently reviewed. Sinus rhythm, right bundle branch block  Assessment/Plan Active Problems:   Shortness of breath   Bone metastasis (HCC)   Cancer of upper lobe of right lung (HCC)   Hypertension   Pleural effusion   Weakness   Hypoalbuminemia   Weight loss   Acute pulmonary embolism (HCC)   Respiratory failure (HCC)   Diabetes mellitus ([place])    Acute pulmonary embolism - Most probably he also has a right lower extremity DVT, which may be the source for his PE. His a patient with malignancy at risk for hypercoagulable state. Admit patient to the hospital, monitored on telemetry, provide Lovenox subcutaneous which she will probably need on discharge home.  Acute hypoxemic respiratory failure - Likely due to #1, provide oxygen as needed.  Stage IV lung  cancer - Chemotherapy as an outpatient, currently on Nivolumab  Type 2 diabetes mellitus - Hold metformin in the setting of having received IV contrast, place patient on sliding scale insulin  Hypertension - Resumed on Norvasc  Bilateral pleural effusions -  Monitor, currently on blood thinners and hold for thora for now    DVT prophylaxis:  heparin infusion Code Status: Full  Family Communication: wife bedside Disposition Plan: admit to tele Consults called: none  Admission status: inpatient    Marzetta Board, MD [facility] Pager [phone]  If 7PM-7AM, please contact night-coverage [email or web address] Password [facility]  08/25/2016, 4:28 PM

## 2016-08-26 ENCOUNTER — Inpatient Hospital Stay (HOSPITAL_COMMUNITY): Payer: Medicare Other

## 2016-08-26 DIAGNOSIS — C7951 Secondary malignant neoplasm of bone: Secondary | ICD-10-CM

## 2016-08-26 DIAGNOSIS — R634 Abnormal weight loss: Secondary | ICD-10-CM

## 2016-08-26 DIAGNOSIS — I2699 Other pulmonary embolism without acute cor pulmonale: Secondary | ICD-10-CM

## 2016-08-26 DIAGNOSIS — J9601 Acute respiratory failure with hypoxia: Secondary | ICD-10-CM

## 2016-08-26 DIAGNOSIS — R531 Weakness: Secondary | ICD-10-CM

## 2016-08-26 DIAGNOSIS — C3411 Malignant neoplasm of upper lobe, right bronchus or lung: Secondary | ICD-10-CM

## 2016-08-26 DIAGNOSIS — R609 Edema, unspecified: Secondary | ICD-10-CM

## 2016-08-26 DIAGNOSIS — I824Y1 Acute embolism and thrombosis of unspecified deep veins of right proximal lower extremity: Secondary | ICD-10-CM

## 2016-08-26 DIAGNOSIS — I82401 Acute embolism and thrombosis of unspecified deep veins of right lower extremity: Secondary | ICD-10-CM

## 2016-08-26 DIAGNOSIS — I10 Essential (primary) hypertension: Secondary | ICD-10-CM

## 2016-08-26 LAB — BASIC METABOLIC PANEL
Anion gap: 7 (ref 5–15)
BUN: 10 mg/dL (ref 6–20)
CHLORIDE: 105 mmol/L (ref 101–111)
CO2: 28 mmol/L (ref 22–32)
Calcium: 8.6 mg/dL — ABNORMAL LOW (ref 8.9–10.3)
Creatinine, Ser: 0.73 mg/dL (ref 0.61–1.24)
GFR calc Af Amer: >60 mL/min (ref >60)
GFR calc non Af Amer: >60 mL/min (ref >60)
Glucose, Bld: 90 mg/dL (ref 65–99)
POTASSIUM: 3.2 mmol/L — AB (ref 3.5–5.1)
SODIUM: 140 mmol/L (ref 135–145)

## 2016-08-26 LAB — CBC
HEMATOCRIT: 39.3 % (ref 39.0–52.0)
HEMOGLOBIN: 13.2 g/dL (ref 13.0–17.0)
MCH: 32.8 pg (ref 26.0–34.0)
MCHC: 33.6 g/dL (ref 30.0–36.0)
MCV: 97.8 fL (ref 78.0–100.0)
Platelets: 231 10*3/uL (ref 150–400)
RBC: 4.02 MIL/uL — AB (ref 4.22–5.81)
RDW: 14 % (ref 11.5–15.5)
WBC: 8.3 10*3/uL (ref 4.0–10.5)

## 2016-08-26 LAB — GLUCOSE, CAPILLARY
GLUCOSE-CAPILLARY: 150 mg/dL — AB (ref 65–99)
GLUCOSE-CAPILLARY: 72 mg/dL (ref 65–99)
GLUCOSE-CAPILLARY: 85 mg/dL (ref 65–99)
GLUCOSE-CAPILLARY: 94 mg/dL (ref 65–99)
GLUCOSE-CAPILLARY: 95 mg/dL (ref 65–99)

## 2016-08-26 MED ORDER — POTASSIUM CHLORIDE CRYS ER 20 MEQ PO TBCR
40.0000 meq | EXTENDED_RELEASE_TABLET | ORAL | Status: AC
  Administered 2016-08-26 (×2): 40 meq via ORAL
  Filled 2016-08-26 (×2): qty 2

## 2016-08-26 MED ORDER — POTASSIUM CHLORIDE CRYS ER 20 MEQ PO TBCR: 40.0000 meq | EXTENDED_RELEASE_TABLET | ORAL | Status: DC

## 2016-08-26 MED ORDER — SODIUM CHLORIDE 0.9% FLUSH
10.0000 mL | INTRAVENOUS | Status: DC | PRN
  Administered 2016-08-26 – 2016-08-27 (×2): 10 mL
  Filled 2016-08-26 (×2): qty 40

## 2016-08-26 NOTE — Progress Notes (Signed)
SATURATION QUALIFICATIONS: (This note is used to comply with regulatory documentation for home oxygen)  Patient Saturations on Room Air at Rest = 96%  Patient Saturations on Room Air while Ambulating = 82%  Patient Saturations on 3 Liters of oxygen while Ambulating = 92%  Please briefly explain why patient needs home oxygen: Unable to maintain adequate oxygenation while ambulating on room air

## 2016-08-26 NOTE — Progress Notes (Signed)
PROGRESS NOTE    Omar Peters  RDE:[id number] DOB: [birth year] DOA: 08/25/2016  PCP: Tamsen Roers, MD   Brief Narrative:  Omar Peters is a 81 y.o. male with medical history significant of stage IV lung cancer, diabetes, hypertension, presents to the emergency room with a chief complaint of shortness of breath and left-sided chest pain. Patient states that he has been having chest pain for the past couple of months, however in the last 3-4 days patient has gotten worse. States that it is getting worse when taking a deep breath. He also is complaining of shortness of breath for quite sometime however in the last few days has gotten a lot worse. He also complains of productive cough. He has been noticing swelling in his right lower extremity for the past week. He denies any palpitations. He denies any abdominal pain, no nausea, vomiting or diarrhea. He is currently getting chemotherapy  Subjective: Feels less short of breath than yesterday.   Assessment & Plan:   Principal Problem:   Acute hypoxemic respiratory failure  (A) PE- have started Lovenox-  Have asked for hematology opinion on what would would be best for anticoagulation- have discussed with Dr Alvy Bimler who is recommending Elilquis or Xarelto- will switch to Eliquis once it is certain he will not need a thoracentesis  (B) b/l pleural effusions- not a new issue- likely malignant- considering a thoracentesis if he is still dyspneic or hypoxic with exertion- in the past he has had a pleurex-   Active Problems:  Right leg DVT  - extensive clot in right leg    Cancer of upper lobe of right lung with Bone metastasis  - under care of Dr Julien Nordmann - receiving chemo with Nivolumab  Left sided chest pain - has had this for 6 months and has been prescribed Tramadol for it    Hypertension - Norvasc    Hypoalbuminemia/   Weight loss/ generalized weakness - due to underlying cancer    Diabetes mellitus  - holding Metformin due to  contrast use- cont ISS  DVT prophylaxis: Lovenox Code Status: Full code Family Communication: wife Disposition Plan: home Consultants:   Phone consult with Dr Alvy Bimler Procedures:    Antimicrobials:  Anti-infectives    None       Objective: Vitals:   08/25/16 1747 08/26/16 0053 08/26/16 0550 08/26/16 1013  BP: (!) 143/71 135/72 133/61 133/78  Pulse: (!) 107 84 91 96  Resp: '18 18 18   '$ Temp: 97.7 F (36.5 C) 97.9 F (36.6 C) 97.7 F (36.5 C)   TempSrc: Oral Oral Oral   SpO2: 96% 98% 96%   Weight: 63.2 kg (139 lb 5.3 oz)     Height: '5\' 6"'$  (1.676 m)       Intake/Output Summary (Last 24 hours) at 08/26/16 1202 Last data filed at 08/26/16 0955  Gross per 24 hour  Intake              480 ml  Output              750 ml  Net             -270 ml   Filed Weights   08/25/16 1747  Weight: 63.2 kg (139 lb 5.3 oz)    Examination: General exam: Appears comfortable  HEENT: PERRLA, oral mucosa moist, no sclera icterus or thrush Respiratory system: Clear to auscultation. Respiratory effort normal. Cardiovascular system: S1 & S2 heard, RRR.  No murmurs  Gastrointestinal system: Abdomen soft,  non-tender, nondistended. Normal bowel sound. No organomegaly Central nervous system: Alert and oriented. No focal neurological deficits. Extremities: No cyanosis, clubbing- edema of right leg noted Skin: No rashes or ulcers Psychiatry:  Mood & affect appropriate.     Data Reviewed: I have personally reviewed following labs and imaging studies  CBC:  Recent Labs Lab 08/25/16 1313 08/26/16 0500  WBC 8.8 8.3  NEUTROABS 5.9  --   HGB 14.9 13.2  HCT 42.5 39.3  MCV 94.9 97.8  PLT 217 725   Basic Metabolic Panel:  Recent Labs Lab 08/25/16 1313 08/26/16 0500  NA 143 140  K 3.5 3.2*  CL 106 105  CO2 27 28  GLUCOSE 85 90  BUN 11 10  CREATININE 0.69 0.73  CALCIUM 9.2 8.6*   GFR: Estimated Creatinine Clearance: 68 mL/min (by C-G formula based on SCr of 0.8 mg/dL). Liver  Function Tests: No results for input(s): AST, ALT, ALKPHOS, BILITOT, PROT, ALBUMIN in the last 168 hours. No results for input(s): LIPASE, AMYLASE in the last 168 hours. No results for input(s): AMMONIA in the last 168 hours. Coagulation Profile: No results for input(s): INR, PROTIME in the last 168 hours. Cardiac Enzymes:  Recent Labs Lab 08/25/16 1313  TROPONINI <0.03   BNP (last 3 results) No results for input(s): PROBNP in the last 8760 hours. HbA1C: No results for input(s): HGBA1C in the last 72 hours. CBG:  Recent Labs Lab 08/25/16 1657 08/26/16 0049 08/26/16 0728 08/26/16 1147  GLUCAP 125* 85 95 94   Lipid Profile: No results for input(s): CHOL, HDL, LDLCALC, TRIG, CHOLHDL, LDLDIRECT in the last 72 hours. Thyroid Function Tests: No results for input(s): TSH, T4TOTAL, FREET4, T3FREE, THYROIDAB in the last 72 hours. Anemia Panel: No results for input(s): VITAMINB12, FOLATE, FERRITIN, TIBC, IRON, RETICCTPCT in the last 72 hours. Urine analysis:    Component Value Date/Time   COLORURINE YELLOW 12/01/2015 2022   APPEARANCEUR CLEAR 12/01/2015 2022   LABSPEC 1.026 12/01/2015 2022   PHURINE 8.0 12/01/2015 2022   GLUCOSEU NEGATIVE 12/01/2015 2022   HGBUR NEGATIVE 12/01/2015 2022   BILIRUBINUR NEGATIVE 12/01/2015 2022   KETONESUR NEGATIVE 12/01/2015 2022   PROTEINUR NEGATIVE 12/01/2015 2022   NITRITE NEGATIVE 12/01/2015 2022   LEUKOCYTESUR NEGATIVE 12/01/2015 2022   Sepsis Labs: '@LABRCNTIP'$ (procalcitonin:4,lacticidven:4) )No results found for this or any previous visit (from the past 240 hour(s)).       Radiology Studies: Dg Chest 2 View  Result Date: 08/25/2016 CLINICAL DATA:  Productive cough, shortness of breath. EXAM: CHEST  2 VIEW COMPARISON:  Radiographs of April 12, 2015. CT scan of July 12, 2016. FINDINGS: Stable cardiomediastinal silhouette. Interval placement of right internal jugular Port-A-Cath with distal tip in expected position of cavoatrial  junction. No pneumothorax is noted. Mild left basilar subsegmental atelectasis is noted. Stable mild loculated right pleural effusion is noted. Right basilar opacity is noted concerning for atelectasis, edema, inflammation or possibly scarring. Bony thorax is unremarkable. IMPRESSION: Mild left basilar subsegmental atelectasis. Interval placement of right internal jugular Port-A-Cath. Stable mild loculated right pleural effusion is noted. Probable stable right basilar opacity is noted as described above. Electronically Signed   By: Marijo Conception, M.D.   On: 08/25/2016 11:35   Ct Angio Chest Pe W And/or Wo Contrast  Result Date: 08/25/2016 CLINICAL DATA:  Metastatic lung cancer.  Shortness of breath. EXAM: CT ANGIOGRAPHY CHEST WITH CONTRAST TECHNIQUE: Multidetector CT imaging of the chest was performed using the standard protocol during bolus administration of intravenous contrast.  Multiplanar CT image reconstructions and MIPs were obtained to evaluate the vascular anatomy. CONTRAST:  57 cc of Isovue 370 COMPARISON:  07/12/2016 RECIST 1.1 Target Lesions: 1. Mass-like area of architectural distortion in the medial aspect of the right upper lobe measures 6.6 x 3.3 cm (image 27 of series 4) previously 6.6 x 3.9 cm Non-target Lesions: 1.  7 mm short axis AP window lymph node (image 34of series 4), stable. 2. 8 mm short axis subcarinal lymph node (image 40 of series 4), previously 9 mm. 3. 11 mm short axis low right paratracheal lymph node (image 35 of series 4), stable. 5. Small chronic thick-walled rim enhancing right pleural effusion, Slightly increased in volume from previous exam, image 60 of series 4. 6. Left lower lobe pulmonary nodularity not visualized. 7. Right glenoid metastasis, present, image 18 is series 4 FINDINGS: Mediastinum: Normal heart size. No pericardial effusion. Aortic atherosclerosis noted. Calcification within the LAD and RCA coronary artery noted. The trachea appears patent and midline.  Mild diffuse wall thickening involving the esophagus. The main pulmonary artery is patent. There is abnormal filling defect within the left dx lower lobe pulmonary artery, image 120 of series 10. Lungs/Pleura: New moderate left pleural effusion with overlying compressive type atelectasis and consolidation. There has been slight increase in volume of loculated effusion overlying the posterior right lower lobe. There is progressive soft tissue consolidation and interstitial thickening involving the right mid and lower lung zone. Similar appearance of masslike consolidation overlying the right lung apex. Upper Abdomen: No focal liver abnormality. The liver has a nodular contour or and there is hypertrophy of the caudate lobe and lateral segment of left lobe. No adrenal abnormality identified. The visualized portions of the pancreas and spleen are normal. Musculoskeletal: Her mid to metastasis involving the right glenoid and scapula is again identified. Subjectively, there appears to be progression of this lesion with further loss of bone. Expansile lytic lesion involving the lateral aspect of the right 6 rib is identified with surrounding soft tissue component measures 3.4 by 2.3 cm, image 56 of series 4. On the previous exam this measured 2.1 x 3.4 cm. Lytic lesion involving the pedicle of T11 measures 3.7 cm, image 89 of series 4. Previously 3.5 cm. a left-sided canal involvement is suspected. Cannot rule out spinal stenosis at this level. Review of the MIP images confirms the above findings. IMPRESSION: 1. Examination is positive for acute pulmonary embolus to the left lower lobe. 2. New moderate volume left pleural effusion and slight increase in volume of loculated right effusion. 3. Worsening aeration to the right lung. 4. Mild progression of multi focal lytic bone metastasis 5. Aortic atherosclerosis and coronary artery disease. Critical Value/emergent results were called by telephone at the time of  interpretation on 08/25/2016 at 2:12 pm to Dr. Virgel Manifold , who verbally acknowledged these results. Electronically Signed   By: Kerby Moors M.D.   On: 08/25/2016 14:12      Scheduled Meds: . amLODipine  10 mg Oral Daily  . aspirin EC  81 mg Oral Daily  . chlorhexidine  15 mL Mouth Rinse BID  . enoxaparin (LOVENOX) injection  65 mg Subcutaneous BID  . feeding supplement (ENSURE ENLIVE)  237 mL Oral BID BM  . insulin aspart  0-9 Units Subcutaneous TID WC  . mouth rinse  15 mL Mouth Rinse q12n4p  . potassium chloride  40 mEq Oral Q4H  . senna-docusate  2 tablet Oral Daily  . sodium chloride flush  3 mL Intravenous Q12H   Continuous Infusions:    LOS: 1 day    Time spent in minutes: 26    [place], MD [facility] Pager: [email or web address] Password [email or web address] 08/26/2016, 12:02 PM

## 2016-08-26 NOTE — Progress Notes (Signed)
*  Preliminary Results* Bilateral lower extremity venous duplex completed. The right lower extremity is positive for deep vein thrombosis involving the right femoral, profunda femoral, popliteal, posterior tibial, and peroneal veins. There is no obvious evidence of deep vein thrombosis involving the left lower extremity. There is no evidence of Baker's cyst bilaterally.  Preliminary results discussed with Dr. Wynelle Cleveland.  08/26/2016 9:00 AM Maudry Mayhew, BS, RVT, RDCS, RDMS

## 2016-08-27 DIAGNOSIS — J90 Pleural effusion, not elsewhere classified: Secondary | ICD-10-CM

## 2016-08-27 DIAGNOSIS — E8809 Other disorders of plasma-protein metabolism, not elsewhere classified: Secondary | ICD-10-CM

## 2016-08-27 LAB — GLUCOSE, CAPILLARY
GLUCOSE-CAPILLARY: 102 mg/dL — AB (ref 65–99)
GLUCOSE-CAPILLARY: 148 mg/dL — AB (ref 65–99)

## 2016-08-27 LAB — CBC
HCT: 37.6 % — ABNORMAL LOW (ref 39.0–52.0)
Hemoglobin: 12.4 g/dL — ABNORMAL LOW (ref 13.0–17.0)
MCH: 32.6 pg (ref 26.0–34.0)
MCHC: 33 g/dL (ref 30.0–36.0)
MCV: 98.9 fL (ref 78.0–100.0)
PLATELETS: 240 10*3/uL (ref 150–400)
RBC: 3.8 MIL/uL — AB (ref 4.22–5.81)
RDW: 14 % (ref 11.5–15.5)
WBC: 6.8 10*3/uL (ref 4.0–10.5)

## 2016-08-27 MED ORDER — CEPHALEXIN 500 MG PO CAPS
500.0000 mg | ORAL_CAPSULE | Freq: Four times a day (QID) | ORAL | Status: DC
  Administered 2016-08-27: 500 mg via ORAL
  Filled 2016-08-27: qty 1

## 2016-08-27 MED ORDER — APIXABAN 5 MG PO TABS
10.0000 mg | ORAL_TABLET | Freq: Two times a day (BID) | ORAL | 0 refills | Status: AC
Start: 2016-08-27 — End: ?

## 2016-08-27 MED ORDER — APIXABAN 5 MG PO TABS: 5.0000 mg | ORAL_TABLET | Freq: Two times a day (BID) | ORAL | 0 refills | Status: DC

## 2016-08-27 MED ORDER — APIXABAN 5 MG PO TABS: 10.0000 mg | ORAL_TABLET | Freq: Two times a day (BID) | ORAL | Status: DC

## 2016-08-27 MED ORDER — ENSURE ENLIVE PO LIQD: 237.0000 mL | Freq: Two times a day (BID) | ORAL | 12 refills | Status: DC

## 2016-08-27 MED ORDER — ENSURE ENLIVE PO LIQD: 237.0000 mL | Freq: Two times a day (BID) | ORAL | 12 refills | Status: AC

## 2016-08-27 MED ORDER — CEPHALEXIN 500 MG PO CAPS: 500.0000 mg | ORAL_CAPSULE | Freq: Four times a day (QID) | ORAL | 0 refills | Status: DC

## 2016-08-27 MED ORDER — HEPARIN SOD (PORK) LOCK FLUSH 100 UNIT/ML IV SOLN
500.0000 [IU] | INTRAVENOUS | Status: AC | PRN
  Administered 2016-08-27: 500 [IU]

## 2016-08-27 MED ORDER — APIXABAN 5 MG PO TABS: 10.0000 mg | ORAL_TABLET | Freq: Two times a day (BID) | ORAL | 0 refills | Status: DC

## 2016-08-27 MED ORDER — APIXABAN 5 MG PO TABS: 5.0000 mg | ORAL_TABLET | Freq: Two times a day (BID) | ORAL | Status: DC

## 2016-08-27 NOTE — Progress Notes (Addendum)
PROGRESS NOTE    Omar Peters  GYB:[id number] DOB: 03-24-1935 DOA: 08/25/2016  PCP: Tamsen Roers, MD   Brief Narrative:  Omar Peters is a 81 y.o. male with medical history significant of stage IV lung cancer, diabetes, hypertension, presents to the emergency room with a chief complaint of shortness of breath and left-sided chest pain. Patient states that he has been having chest pain for the past couple of months, however in the last 3-4 days patient has gotten worse. States that it is getting worse when taking a deep breath. He also is complaining of shortness of breath for quite sometime however in the last few days has gotten a lot worse. He also complains of productive cough. He has been noticing swelling in his right lower extremity for the past week. He denies any palpitations. He denies any abdominal pain, no nausea, vomiting or diarrhea. He is currently getting chemotherapy  Subjective: Complains of discharge from the chronic cyst on his neck. Ambulated well in the hall yesterday but had some mild dyspnea.   Assessment & Plan:   Principal Problem:   Acute hypoxemic respiratory failure  (A) PE- have started Lovenox-  Have asked for hematology opinion on what would would be best for anticoagulation and have discussed this with Dr Alvy Bimler who is recommending Elilquis or Xarelto- have switched to Eliquis today  - will ned to be discharged with O2  (B) b/l pleural effusions- not a new issue- likely malignant with loculations-- in the past he has had a pleurex- would hold off on a thoracentesis at this point - effusions need to be followed closely as outpt  (C) cancer of right lung  Active Problems:  Right leg DVT  - extensive clot in right leg- TEDS need to be worn and patient has these already at home  Cyst on neck>> Abscess - patient states he has had a chronic cyst on his neck for about 30 yrs but lately he has been picking on it and it has begun to drain.  - on my exam  today, I was able to express pus from it - have spoken with gen surgery about I and D- recommended to ask patient to do warm compresses, express as much material as he can and to  place him on Keflex- he should follow up in the gen surgery office in 1 wk.     Cancer of upper lobe of right lung with Bone metastasis  - under care of Dr Julien Nordmann - receiving chemo with Nivolumab  Left sided chest pain - has had this for 6 months and has been prescribed Tramadol for it    Hypertension - Norvasc    Hypoalbuminemia/   Weight loss/ generalized weakness - due to underlying cancer    Diabetes mellitus  - held Metformin in the hospital due to contrast use- - can resume tomorrow  DVT prophylaxis: Lovenox Code Status: Full code Family Communication: wife Disposition Plan: home Consultants:  Phone consult with Dr Alvy Bimler and Dr Harlow Asa Procedures:   none Antimicrobials:  Anti-infectives    None       Objective: Vitals:   08/26/16 1013 08/26/16 1431 08/26/16 2100 08/27/16 0536  BP: 133/78 138/72 134/66 (!) 148/78  Pulse: 96 98 95 100  Resp:  '16 16 16  '$ Temp:  97.1 F (36.2 C) 98.6 F (37 C) 98 F (36.7 C)  TempSrc:  Oral Oral Oral  SpO2:  98% 99% 96%  Weight:      Height:  Intake/Output Summary (Last 24 hours) at 08/27/16 1242 Last data filed at 08/27/16 1000  Gross per 24 hour  Intake              480 ml  Output              400 ml  Net               80 ml   Filed Weights   08/25/16 1747  Weight: 63.2 kg (139 lb 5.3 oz)    Examination: General exam: Appears comfortable  HEENT: PERRLA, oral mucosa moist, no sclera icterus or thrush- large fluctuant mass on anterior neck just to the left of the midline- able to express pus from it today, mild tenderness and erythema.  Respiratory system: Clear to auscultation. Respiratory effort normal. Cardiovascular system: S1 & S2 heard, RRR.  No murmurs  Gastrointestinal system: Abdomen soft, non-tender, nondistended. Normal  bowel sound. No organomegaly Central nervous system: Alert and oriented. No focal neurological deficits. Extremities: No cyanosis, clubbing- edema of right leg noted Skin: No rashes or ulcers Psychiatry:  Mood & affect appropriate.     Data Reviewed: I have personally reviewed following labs and imaging studies  CBC:  Recent Labs Lab 08/25/16 1313 08/26/16 0500 08/27/16 0500  WBC 8.8 8.3 6.8  NEUTROABS 5.9  --   --   HGB 14.9 13.2 12.4*  HCT 42.5 39.3 37.6*  MCV 94.9 97.8 98.9  PLT 217 231 696   Basic Metabolic Panel:  Recent Labs Lab 08/25/16 1313 08/26/16 0500  NA 143 140  K 3.5 3.2*  CL 106 105  CO2 27 28  GLUCOSE 85 90  BUN 11 10  CREATININE 0.69 0.73  CALCIUM 9.2 8.6*   GFR: Estimated Creatinine Clearance: 68 mL/min (by C-G formula based on SCr of 0.8 mg/dL). Liver Function Tests: No results for input(s): AST, ALT, ALKPHOS, BILITOT, PROT, ALBUMIN in the last 168 hours. No results for input(s): LIPASE, AMYLASE in the last 168 hours. No results for input(s): AMMONIA in the last 168 hours. Coagulation Profile: No results for input(s): INR, PROTIME in the last 168 hours. Cardiac Enzymes:  Recent Labs Lab 08/25/16 1313  TROPONINI <0.03   BNP (last 3 results) No results for input(s): PROBNP in the last 8760 hours. HbA1C: No results for input(s): HGBA1C in the last 72 hours. CBG:  Recent Labs Lab 08/26/16 1147 08/26/16 1635 08/26/16 2105 08/27/16 0740 08/27/16 1148  GLUCAP 94 150* 72 102* 148*   Lipid Profile: No results for input(s): CHOL, HDL, LDLCALC, TRIG, CHOLHDL, LDLDIRECT in the last 72 hours. Thyroid Function Tests: No results for input(s): TSH, T4TOTAL, FREET4, T3FREE, THYROIDAB in the last 72 hours. Anemia Panel: No results for input(s): VITAMINB12, FOLATE, FERRITIN, TIBC, IRON, RETICCTPCT in the last 72 hours. Urine analysis:    Component Value Date/Time   COLORURINE YELLOW 12/01/2015 2022   APPEARANCEUR CLEAR 12/01/2015 2022    LABSPEC 1.026 12/01/2015 2022   PHURINE 8.0 12/01/2015 2022   GLUCOSEU NEGATIVE 12/01/2015 2022   HGBUR NEGATIVE 12/01/2015 2022   BILIRUBINUR NEGATIVE 12/01/2015 2022   KETONESUR NEGATIVE 12/01/2015 2022   PROTEINUR NEGATIVE 12/01/2015 2022   NITRITE NEGATIVE 12/01/2015 2022   LEUKOCYTESUR NEGATIVE 12/01/2015 2022   Sepsis Labs: '@LABRCNTIP'$ (procalcitonin:4,lacticidven:4) )No results found for this or any previous visit (from the past 240 hour(s)).       Radiology Studies: Ct Angio Chest Pe W And/or Wo Contrast  Result Date: 08/25/2016 CLINICAL DATA:  Metastatic lung cancer.  Shortness of breath. EXAM: CT ANGIOGRAPHY CHEST WITH CONTRAST TECHNIQUE: Multidetector CT imaging of the chest was performed using the standard protocol during bolus administration of intravenous contrast. Multiplanar CT image reconstructions and MIPs were obtained to evaluate the vascular anatomy. CONTRAST:  57 cc of Isovue 370 COMPARISON:  07/12/2016 RECIST 1.1 Target Lesions: 1. Mass-like area of architectural distortion in the medial aspect of the right upper lobe measures 6.6 x 3.3 cm (image 27 of series 4) previously 6.6 x 3.9 cm Non-target Lesions: 1.  7 mm short axis AP window lymph node (image 34of series 4), stable. 2. 8 mm short axis subcarinal lymph node (image 40 of series 4), previously 9 mm. 3. 11 mm short axis low right paratracheal lymph node (image 35 of series 4), stable. 5. Small chronic thick-walled rim enhancing right pleural effusion, Slightly increased in volume from previous exam, image 60 of series 4. 6. Left lower lobe pulmonary nodularity not visualized. 7. Right glenoid metastasis, present, image 18 is series 4 FINDINGS: Mediastinum: Normal heart size. No pericardial effusion. Aortic atherosclerosis noted. Calcification within the LAD and RCA coronary artery noted. The trachea appears patent and midline. Mild diffuse wall thickening involving the esophagus. The main pulmonary artery is patent.  There is abnormal filling defect within the left dx lower lobe pulmonary artery, image 120 of series 10. Lungs/Pleura: New moderate left pleural effusion with overlying compressive type atelectasis and consolidation. There has been slight increase in volume of loculated effusion overlying the posterior right lower lobe. There is progressive soft tissue consolidation and interstitial thickening involving the right mid and lower lung zone. Similar appearance of masslike consolidation overlying the right lung apex. Upper Abdomen: No focal liver abnormality. The liver has a nodular contour or and there is hypertrophy of the caudate lobe and lateral segment of left lobe. No adrenal abnormality identified. The visualized portions of the pancreas and spleen are normal. Musculoskeletal: Her mid to metastasis involving the right glenoid and scapula is again identified. Subjectively, there appears to be progression of this lesion with further loss of bone. Expansile lytic lesion involving the lateral aspect of the right 6 rib is identified with surrounding soft tissue component measures 3.4 by 2.3 cm, image 56 of series 4. On the previous exam this measured 2.1 x 3.4 cm. Lytic lesion involving the pedicle of T11 measures 3.7 cm, image 89 of series 4. Previously 3.5 cm. a left-sided canal involvement is suspected. Cannot rule out spinal stenosis at this level. Review of the MIP images confirms the above findings. IMPRESSION: 1. Examination is positive for acute pulmonary embolus to the left lower lobe. 2. New moderate volume left pleural effusion and slight increase in volume of loculated right effusion. 3. Worsening aeration to the right lung. 4. Mild progression of multi focal lytic bone metastasis 5. Aortic atherosclerosis and coronary artery disease. Critical Value/emergent results were called by telephone at the time of interpretation on 08/25/2016 at 2:12 pm to Dr. Virgel Manifold , who verbally acknowledged these results.  Electronically Signed   By: Kerby Moors M.D.   On: 08/25/2016 14:12      Scheduled Meds: . amLODipine  10 mg Oral Daily  . apixaban  10 mg Oral BID  . [START ON 09/04/2016] apixaban  5 mg Oral BID  . chlorhexidine  15 mL Mouth Rinse BID  . feeding supplement (ENSURE ENLIVE)  237 mL Oral BID BM  . insulin aspart  0-9 Units Subcutaneous TID WC  . mouth rinse  15 mL Mouth Rinse q12n4p  . senna-docusate  2 tablet Oral Daily  . sodium chloride flush  3 mL Intravenous Q12H   Continuous Infusions:    LOS: 2 days    Time spent in minutes: [place], MD [facility] Pager: [email or web address] Password [email or web address] 08/27/2016, 12:42 PM

## 2016-08-27 NOTE — Progress Notes (Signed)
Nutrition Brief Note  Patient identified on the Malnutrition Screening Tool (MST) Report  Patient's weight is stable. Pt eating 50-100% of meals at this time. Pt has been ordered Ensure supplements.   Wt Readings from Last 15 Encounters:  08/25/16 139 lb 5.3 oz (63.2 kg)  08/16/16 141 lb 6.4 oz (64.1 kg)  08/02/16 137 lb 12.8 oz (62.5 kg)  07/20/16 136 lb 4.8 oz (61.8 kg)  07/05/16 138 lb 14.4 oz (63 kg)  06/21/16 140 lb 8 oz (63.7 kg)  06/13/16 143 lb (64.9 kg)  06/07/16 143 lb 1.6 oz (64.9 kg)  05/24/16 142 lb (64.4 kg)  05/10/16 142 lb 4.8 oz (64.5 kg)  04/26/16 142 lb 9.6 oz (64.7 kg)  04/11/16 142 lb (64.4 kg)  03/29/16 145 lb 6.4 oz (66 kg)  03/29/16 145 lb 14.4 oz (66.2 kg)  03/15/16 144 lb 6.4 oz (65.5 kg)    Body mass index is 22.49 kg/m. Patient meets criteria for normal range based on current BMI.   Current diet order is regular, patient is consuming approximately 50-100% of meals at this time. Labs and medications reviewed.   No nutrition interventions warranted at this time. If nutrition issues arise, please consult RD.   Clayton Bibles, MS, RD, LDN Pager: [phone] After Hours Pager: [phone]

## 2016-08-27 NOTE — Discharge Instructions (Signed)
Information on my medicine - ELIQUIS (apixaban)  This medication education was reviewed with me or my healthcare representative as part of my discharge preparation.  The pharmacist that spoke with me during my hospital stay was:  Henreitta Leber. PharmD.  Why was Eliquis prescribed for you? Eliquis was prescribed to treat blood clots that may have been found in the veins of your legs (deep vein thrombosis) or in your lungs (pulmonary embolism) and to reduce the risk of them occurring again.  What do You need to know about Eliquis ? The starting dose is 10 mg (two 5 mg tablets) taken TWICE daily for the FIRST SEVEN (7) DAYS, then on Monday, September 18th   the dose is reduced to ONE 5 mg tablet taken TWICE daily.  Eliquis may be taken with or without food.   Try to take the dose about the same time in the morning and in the evening. If you have difficulty swallowing the tablet whole please discuss with your pharmacist how to take the medication safely.  Take Eliquis exactly as prescribed and DO NOT stop taking Eliquis without talking to the doctor who prescribed the medication.  Stopping may increase your risk of developing a new blood clot.  Refill your prescription before you run out.  After discharge, you should have regular check-up appointments with your healthcare provider that is prescribing your Eliquis.    What do you do if you miss a dose? If a dose of ELIQUIS is not taken at the scheduled time, take it as soon as possible on the same day and twice-daily administration should be resumed. The dose should not be doubled to make up for a missed dose.  Important Safety Information A possible side effect of Eliquis is bleeding. You should call your healthcare provider right away if you experience any of the following: ? Bleeding from an injury or your nose that does not stop. ? Unusual colored urine (red or dark brown) or unusual colored stools (red or black). ? Unusual bruising for  unknown reasons. ? A serious fall or if you hit your head (even if there is no bleeding).  Some medicines may interact with Eliquis and might increase your risk of bleeding or clotting while on Eliquis. To help avoid this, consult your healthcare provider or pharmacist prior to using any new prescription or non-prescription medications, including herbals, vitamins, non-steroidal anti-inflammatory drugs (NSAIDs) and supplements.  This website has more information on Eliquis (apixaban): http://www.eliquis.com/eliquis/home

## 2016-08-27 NOTE — Care Management Note (Signed)
Case Management Note  Patient Details  Name: Omar Peters MRN: [id number] Date of Birth: 1935/09/19  Subjective/Objective:   Pulmonary embolism                 Action/Plan: Discharge Planning: AVS reviewed:  NCM spoke to pt's wife. Wife states portable oxygen was delivered to room by [facility]. They will deliver concentrator to home once pt is home. Pharmacy provided wife with Eliquis 30 day free trial card. Explained to wife how to use at pharmacy. NCM followed up with Unit RN. Rx were sent to a mail order pharmacy. She will follow up with attending for hard Rx or escribe to local pharmacy.  PCP- Tamsen Roers MD   Expected Discharge Date:  08/27/2016          Expected Discharge Plan:  Home/Self Care  In-House Referral:  NA  Discharge planning Services  CM Consult  Post Acute Care Choice:  NA Choice offered to:     DME Arranged:  Oxygen DME Agency:  [place]:  NA [place] Agency:  NA  Status of Service:  Completed, signed off  If discussed at [place] of Stay Meetings, dates discussed:    Additional Comments:  Erenest Rasher, RN 08/27/2016, 3:44 PM

## 2016-08-27 NOTE — Progress Notes (Signed)
ANTICOAGULATION CONSULT NOTE - Follow-up  Pharmacy Consult for Lovenox to apixaban Indication: pulmonary embolus  Allergies  Allergen Reactions  . Marinol [Dronabinol] Other (See Comments)    Confusion He does not take this any more   Patient Measurements: Height: '5\' 6"'$  (167.6 cm) Weight: 139 lb 5.3 oz (63.2 kg) IBW/kg (Calculated) : 63.8 Heparin Dosing Weight: 64.1kg  Vital Signs: Temp: 98 F (36.7 C) (09/10 0536) Temp Source: Oral (09/10 0536) BP: 148/78 (09/10 0536) Pulse Rate: 100 (09/10 0536)  Labs:  Recent Labs  08/25/16 1313 08/26/16 0500 08/27/16 0500  HGB 14.9 13.2 12.4*  HCT 42.5 39.3 37.6*  PLT 217 231 240  CREATININE 0.69 0.73  --   TROPONINI <0.03  --   --    Estimated Creatinine Clearance: 68 mL/min (by C-G formula based on SCr of 0.8 mg/dL).   Assessment: 3 yoM to ED 9/8 with  [facility], RLE swelling since 9/6, also L sided chest pain x 2 months - but worsened last 3-4 days.  - Lovenox for PE per chest CT Stage IV NSCLC, bone mets; Nivolumab clinical trial q14days. Complaint of chest pain at [place] office visit 8/30 - started Tramadol.  Today, 08/27/2016:  CBC: Hgb mildly decreased, pltc WNL  Renal: SCr WNL 9/9 with CrCl > 84m/min  Orders to change to eliquis today  Last dose of therapeutic LMWH 9/10 at 09:24  Goal of Therapy:  Anti-Xa level 0.6-1 units/ml 4hrs after LMWH dose given Monitor platelets by anticoagulation protocol: Yes   Plan:   Based on last LMWH dose, start eliquis '10mg'$  tonight at 22:00 then '10mg'$  PO BID x 7 days then reduce to '5mg'$  PO BID to complete course  Monitor CBC and for bleeding  Pharmacist to provide education and coupon prior to discharge  DDoreene Eland PharmD, BCPS.   Pager: [phone]/09/2016 10:01 AM

## 2016-08-29 ENCOUNTER — Other Ambulatory Visit: Payer: Self-pay | Admitting: *Deleted

## 2016-08-29 ENCOUNTER — Other Ambulatory Visit: Payer: Self-pay | Admitting: Medical Oncology

## 2016-08-29 DIAGNOSIS — C3411 Malignant neoplasm of upper lobe, right bronchus or lung: Secondary | ICD-10-CM

## 2016-08-30 ENCOUNTER — Ambulatory Visit (HOSPITAL_BASED_OUTPATIENT_CLINIC_OR_DEPARTMENT_OTHER): Payer: Medicare Other | Admitting: Internal Medicine

## 2016-08-30 ENCOUNTER — Telehealth: Payer: Self-pay | Admitting: Internal Medicine

## 2016-08-30 ENCOUNTER — Encounter: Payer: Self-pay | Admitting: Internal Medicine

## 2016-08-30 ENCOUNTER — Ambulatory Visit (HOSPITAL_BASED_OUTPATIENT_CLINIC_OR_DEPARTMENT_OTHER): Payer: Medicare Other

## 2016-08-30 ENCOUNTER — Other Ambulatory Visit (HOSPITAL_COMMUNITY)
Admission: RE | Admit: 2016-08-30 | Discharge: 2016-08-30 | Disposition: A | Discharge status: Home / Self Care | Payer: Medicare Other | Source: Ambulatory Visit | Attending: Internal Medicine | Admitting: Internal Medicine

## 2016-08-30 ENCOUNTER — Ambulatory Visit: Payer: Medicare Other

## 2016-08-30 ENCOUNTER — Other Ambulatory Visit: Payer: Medicare Other

## 2016-08-30 ENCOUNTER — Encounter: Payer: Self-pay | Admitting: Medical Oncology

## 2016-08-30 ENCOUNTER — Other Ambulatory Visit (HOSPITAL_BASED_OUTPATIENT_CLINIC_OR_DEPARTMENT_OTHER): Payer: Medicare Other

## 2016-08-30 VITALS — BP 127/70 | HR 114 | Temp 98.4°F | Resp 18 | Ht 66.0 in | Wt 135.9 lb

## 2016-08-30 DIAGNOSIS — C7951 Secondary malignant neoplasm of bone: Secondary | ICD-10-CM

## 2016-08-30 DIAGNOSIS — Z23 Encounter for immunization: Secondary | ICD-10-CM | POA: Diagnosis not present

## 2016-08-30 DIAGNOSIS — R079 Chest pain, unspecified: Secondary | ICD-10-CM

## 2016-08-30 DIAGNOSIS — Z006 Encounter for examination for normal comparison and control in clinical research program: Secondary | ICD-10-CM

## 2016-08-30 DIAGNOSIS — C3411 Malignant neoplasm of upper lobe, right bronchus or lung: Secondary | ICD-10-CM

## 2016-08-30 DIAGNOSIS — Z5112 Encounter for antineoplastic immunotherapy: Secondary | ICD-10-CM

## 2016-08-30 DIAGNOSIS — I2699 Other pulmonary embolism without acute cor pulmonale: Secondary | ICD-10-CM

## 2016-08-30 DIAGNOSIS — R630 Anorexia: Secondary | ICD-10-CM

## 2016-08-30 DIAGNOSIS — F329 Major depressive disorder, single episode, unspecified: Secondary | ICD-10-CM

## 2016-08-30 DIAGNOSIS — Z95828 Presence of other vascular implants and grafts: Secondary | ICD-10-CM

## 2016-08-30 DIAGNOSIS — J90 Pleural effusion, not elsewhere classified: Secondary | ICD-10-CM

## 2016-08-30 LAB — CBC WITH DIFFERENTIAL/PLATELET
BASO%: 0.5 % (ref 0.0–2.0)
BASOS ABS: 0.1 10*3/uL (ref 0.0–0.1)
EOS ABS: 0.2 10*3/uL (ref 0.0–0.5)
EOS%: 2.5 % (ref 0.0–7.0)
HEMATOCRIT: 40.4 % (ref 38.4–49.9)
HGB: 13.7 g/dL (ref 13.0–17.1)
LYMPH#: 1.7 10*3/uL (ref 0.9–3.3)
LYMPH%: 17.5 % (ref 14.0–49.0)
MCH: 33.2 pg (ref 27.2–33.4)
MCHC: 33.9 g/dL (ref 32.0–36.0)
MCV: 97.8 fL (ref 79.3–98.0)
MONO#: 1.2 10*3/uL — AB (ref 0.1–0.9)
MONO%: 12.6 % (ref 0.0–14.0)
NEUT#: 6.4 10*3/uL (ref 1.5–6.5)
NEUT%: 66.9 % (ref 39.0–75.0)
PLATELETS: 264 10*3/uL (ref 140–400)
RBC: 4.13 10*6/uL — AB (ref 4.20–5.82)
RDW: 13.7 % (ref 11.0–14.6)
WBC: 9.6 10*3/uL (ref 4.0–10.3)

## 2016-08-30 LAB — COMPREHENSIVE METABOLIC PANEL
ALT: 19 U/L (ref 0–55)
ANION GAP: 9 meq/L (ref 3–11)
AST: 22 U/L (ref 5–34)
Albumin: 2.6 g/dL — ABNORMAL LOW (ref 3.5–5.0)
Alkaline Phosphatase: 143 U/L (ref 40–150)
BILIRUBIN TOTAL: 0.34 mg/dL (ref 0.20–1.20)
BUN: 9.9 mg/dL (ref 7.0–26.0)
CALCIUM: 9.3 mg/dL (ref 8.4–10.4)
CHLORIDE: 104 meq/L (ref 98–109)
CO2: 28 meq/L (ref 22–29)
CREATININE: 0.8 mg/dL (ref 0.7–1.3)
EGFR: 86 mL/min/{1.73_m2} — AB (ref >90)
Glucose: 138 mg/dl (ref 70–140)
Potassium: 3.6 mEq/L (ref 3.5–5.1)
Sodium: 141 mEq/L (ref 136–145)
Total Protein: 6.7 g/dL (ref 6.4–8.3)

## 2016-08-30 LAB — LACTATE DEHYDROGENASE: LDH: 276 U/L — AB (ref 125–245)

## 2016-08-30 LAB — PHOSPHORUS: PHOSPHORUS: 3.6 mg/dL (ref 2.5–4.6)

## 2016-08-30 LAB — MAGNESIUM: MAGNESIUM: 2.1 mg/dL (ref 1.5–2.5)

## 2016-08-30 MED ORDER — SODIUM CHLORIDE 0.9 % IJ SOLN
10.0000 mL | INTRAMUSCULAR | Status: DC | PRN
  Administered 2016-08-30: 10 mL
  Filled 2016-08-30: qty 10

## 2016-08-30 MED ORDER — INFLUENZA VAC SPLIT QUAD 0.5 ML IM SUSY
0.5000 mL | PREFILLED_SYRINGE | Freq: Once | INTRAMUSCULAR | Status: AC
  Administered 2016-08-30: 0.5 mL via INTRAMUSCULAR
  Filled 2016-08-30: qty 0.5

## 2016-08-30 MED ORDER — INV-NIVOLUMAB CHEMO INJECTION 100 MG/10 ML BMS CA 209-370
240.0000 mg | Freq: Once | INTRAVENOUS | Status: AC
  Administered 2016-08-30: 240 mg via INTRAVENOUS
  Filled 2016-08-30: qty 24

## 2016-08-30 MED ORDER — HEPARIN SOD (PORK) LOCK FLUSH 100 UNIT/ML IV SOLN
500.0000 [IU] | Freq: Once | INTRAVENOUS | Status: AC | PRN
  Administered 2016-08-30: 500 [IU]
  Filled 2016-08-30: qty 5

## 2016-08-30 MED ORDER — SODIUM CHLORIDE 0.9 % IV SOLN
Freq: Once | INTRAVENOUS | Status: AC
  Administered 2016-08-30: 15:00:00 via INTRAVENOUS

## 2016-08-30 MED ORDER — SODIUM CHLORIDE 0.9 % IJ SOLN
10.0000 mL | INTRAMUSCULAR | Status: DC | PRN
  Administered 2016-08-30: 10 mL via INTRAVENOUS
  Filled 2016-08-30: qty 10

## 2016-08-30 NOTE — Patient Instructions (Signed)

## 2016-08-30 NOTE — Progress Notes (Signed)
[facility] [phone]   Fax:([phone]) [place], MD [address] [place] [address]  DIAGNOSIS: Stage IV (T3, N3, M1b) non-small cell lung cancer, adenocarcinoma diagnosed in January 2016 presented with right upper lobe lung mass in addition to mediastinal lymphadenopathy and large right pleural effusion as well as bone metastasis.  PRIOR THERAPY:  1. Status post Pleurx catheter placement by Dr. Servando Snare for drainage of the right pleural effusion.  2. Systemic chemotherapy with carboplatin for AUC of 5 and Alimta 500 MG/M2 every 3 weeks. Status post 6 cycles. 3. Status post palliative radiotherapy to the metastatic bone lesion in the right glenoid as well as the right ilium area completed 02/18/2016  CURRENT THERAPY: The patient is participating in the [place] 370 clinical trial and has been randomized to group A arm B-3 to receive Nivolumab 240 mg given every 2 weeks. First cycle expected 06/10/2015. Status post 31 cycles.  INTERVAL HISTORY: Omar Peters 81 y.o. male returns to the clinic today for follow-up visit accompanied by his wife. The patient was recently admitted to [facility] with worsening shortness of breath. During his evaluation CT angiogram of the chest was performed on 08/25/2016 and it showed acute pulmonary embolus to the left lower lobe. There was new moderate volume left pleural effusion and slight increase in the volume of the loculated right there was also mild progression of the multifocal lytic bone metastasis. The patient was started on treatment with Eliquis and he is feeling fine today except for the fatigue and shortness breath. He is currently on home oxygen saturation of 98% on 3 L. He tolerated the last cycle of his treatment with Nivolumab fairly well with no significant adverse effects. He denied having any significant fever or chills. He has no nausea, vomiting, diarrhea or  constipation. The patient denied having any significant skin rash. He denied having any significant weight loss or night sweats.He is here today for evaluation before starting cycle #32.  MEDICAL HISTORY: Past Medical History:  Diagnosis Date  . Anxiety    due to diagnosis of Stage 4 Lung Cancer  . Bone cancer ([place])    non small cell lung with mets to bone  . Diabetes mellitus without complication ([place])   . GERD (gastroesophageal reflux disease)   . Hypertension   . Lung cancer ([place])    stage IV non small cell lung cancer dx. January 2016   . Macular degeneration of right eye   . Shortness of breath dyspnea    01/14/15 at night and has to sit up    ALLERGIES:  is allergic to marinol [dronabinol].  MEDICATIONS:  Current Outpatient Prescriptions  Medication Sig Dispense Refill  . amitriptyline (ELAVIL) 10 MG tablet Take 1 tablet (10 mg total) by mouth at bedtime. 30 tablet 0  . amLODipine (NORVASC) 10 MG tablet Take 10 mg by mouth daily.    Marland Kitchen apixaban (ELIQUIS) 5 MG TABS tablet Take 2 tablets (10 mg total) by mouth 2 (two) times daily. 74 tablet 0  . B Complex-Biotin-FA (B-COMPLEX PO) Take 1 each by mouth daily. Reported on 03/01/2016    . cephALEXin (KEFLEX) 500 MG capsule Take 1 capsule (500 mg total) by mouth every 6 (six) hours. 14 capsule 0  . feeding supplement, ENSURE ENLIVE, (ENSURE ENLIVE) LIQD Take 237 mLs by mouth 2 (two) times daily between meals. 237 mL 12  . lidocaine-prilocaine (EMLA) cream  Apply 1 application topically as needed. Pt is to apply cream 1 hour before receiving chemo 30 g 0  . magnesium hydroxide (MILK OF MAGNESIA) 400 MG/5ML suspension Take 30 mLs by mouth daily as needed for mild constipation.    . metFORMIN (GLUCOPHAGE) 500 MG tablet Take 500 mg by mouth daily.    . mirtazapine (REMERON) 30 MG tablet Take 1 tablet by mouth at  bedtime 30 tablet 2  . prochlorperazine (COMPAZINE) 10 MG tablet Take 1 tablet (10 mg total) by mouth every 6 (six) hours as  needed for nausea or vomiting. 30 tablet 0  . ranitidine (ZANTAC) 150 MG tablet Take 150 mg by mouth daily.    Marland Kitchen senna-docusate (SENNA S) 8.6-50 MG per tablet Take 2 tablets by mouth daily.    . traMADol (ULTRAM) 50 MG tablet Take 1 tablet (50 mg total) by mouth every 6 (six) hours as needed. (Patient taking differently: Take 50 mg by mouth every 6 (six) hours as needed for moderate pain. ) 60 tablet 0  . [START ON 09/04/2016] apixaban (ELIQUIS) 5 MG TABS tablet Take 1 tablet (5 mg total) by mouth 2 (two) times daily. (Patient not taking: Reported on 08/30/2016) 60 tablet 0  . HYDROcodone-acetaminophen (NORCO) 5-325 MG per tablet Take 1 tablet by mouth every 6 (six) hours as needed for moderate pain. (Patient not taking: Reported on 08/30/2016) 30 tablet 0  . PRESCRIPTION MEDICATION Place 1 each into the right eye every 8 (eight) weeks. Eye injection.     No current facility-administered medications for this visit.     SURGICAL HISTORY:  Past Surgical History:  Procedure Laterality Date  . CHEST TUBE INSERTION Right 01/15/2015   Procedure: INSERTION PLEURAL DRAINAGE CATHETER WITH ULTRASOUND AND FLURO GUIDANCE;  Surgeon: Grace Isaac, MD;  Location: [facility];  Service: Thoracic;  Laterality: Right;  . REMOVAL OF PLEURAL DRAINAGE CATHETER Right 03/26/2015   Procedure: REMOVAL OF PLEURAL DRAINAGE CATHETER;  Surgeon: Grace Isaac, MD;  Location: [facility];  Service: Thoracic;  Laterality: Right;  . VEIN LIGATION AND STRIPPING  1977    REVIEW OF SYSTEMS:  Constitutional: positive for fatigue Eyes: negative Ears, nose, mouth, throat, and face: negative Respiratory: positive for dyspnea on exertion Cardiovascular: negative Gastrointestinal: negative Genitourinary:negative Integument/breast: negative Hematologic/lymphatic: negative Musculoskeletal:positive for muscle weakness Neurological: negative Behavioral/Psych: negative Endocrine: negative Allergic/Immunologic: negative   PHYSICAL  EXAMINATION: General appearance: alert, cooperative and no distress Head: Normocephalic, without obvious abnormality, atraumatic Neck: no adenopathy, no JVD, supple, symmetrical, trachea midline and thyroid not enlarged, symmetric, no tenderness/mass/nodules Lymph nodes: Cervical, supraclavicular, and axillary nodes normal. Resp: clear to auscultation bilaterally Back: symmetric, no curvature. ROM normal. No CVA tenderness. Cardio: regular rate and rhythm, S1, S2 normal, no murmur, click, rub or gallop GI: soft, non-tender; bowel sounds normal; no masses,  no organomegaly Extremities: extremities normal, atraumatic, no cyanosis or edema Neurologic: Alert and oriented X 3, normal strength and tone. Normal symmetric reflexes. Normal coordination and gait  ECOG PERFORMANCE STATUS: 1 - Symptomatic but completely ambulatory  Blood pressure 127/70, pulse (!) 114, temperature 98.4 F (36.9 C), temperature source Oral, resp. rate 18, height '5\' 6"'$  (1.676 m), weight 135 lb 14.4 oz (61.6 kg), SpO2 98 %.  LABORATORY DATA: Lab Results  Component Value Date   WBC 9.6 08/30/2016   HGB 13.7 08/30/2016   HCT 40.4 08/30/2016   MCV 97.8 08/30/2016   PLT 264 08/30/2016      Chemistry  Component Value Date/Time   NA 141 08/30/2016 1312   K 3.6 08/30/2016 1312   CL 105 08/26/2016 0500   CO2 28 08/30/2016 1312   BUN 9.9 08/30/2016 1312   CREATININE 0.8 08/30/2016 1312   GLU 231 (H) 03/10/2015 1539      Component Value Date/Time   CALCIUM 9.3 08/30/2016 1312   ALKPHOS 143 08/30/2016 1312   AST 22 08/30/2016 1312   ALT 19 08/30/2016 1312   BILITOT 0.34 08/30/2016 1312       RADIOGRAPHIC STUDIES: Dg Chest 2 View  Result Date: 08/25/2016 CLINICAL DATA:  Productive cough, shortness of breath. EXAM: CHEST  2 VIEW COMPARISON:  Radiographs of April 12, 2015. CT scan of July 12, 2016. FINDINGS: Stable cardiomediastinal silhouette. Interval placement of right internal jugular Port-A-Cath with  distal tip in expected position of cavoatrial junction. No pneumothorax is noted. Mild left basilar subsegmental atelectasis is noted. Stable mild loculated right pleural effusion is noted. Right basilar opacity is noted concerning for atelectasis, edema, inflammation or possibly scarring. Bony thorax is unremarkable. IMPRESSION: Mild left basilar subsegmental atelectasis. Interval placement of right internal jugular Port-A-Cath. Stable mild loculated right pleural effusion is noted. Probable stable right basilar opacity is noted as described above. Electronically Signed   By: Marijo Conception, M.D.   On: 08/25/2016 11:35   Ct Angio Chest Pe W And/or Wo Contrast  Result Date: 08/25/2016 CLINICAL DATA:  Metastatic lung cancer.  Shortness of breath. EXAM: CT ANGIOGRAPHY CHEST WITH CONTRAST TECHNIQUE: Multidetector CT imaging of the chest was performed using the standard protocol during bolus administration of intravenous contrast. Multiplanar CT image reconstructions and MIPs were obtained to evaluate the vascular anatomy. CONTRAST:  57 cc of Isovue 370 COMPARISON:  07/12/2016 RECIST 1.1 Target Lesions: 1. Mass-like area of architectural distortion in the medial aspect of the right upper lobe measures 6.6 x 3.3 cm (image 27 of series 4) previously 6.6 x 3.9 cm Non-target Lesions: 1.  7 mm short axis AP window lymph node (image 34of series 4), stable. 2. 8 mm short axis subcarinal lymph node (image 40 of series 4), previously 9 mm. 3. 11 mm short axis low right paratracheal lymph node (image 35 of series 4), stable. 5. Small chronic thick-walled rim enhancing right pleural effusion, Slightly increased in volume from previous exam, image 60 of series 4. 6. Left lower lobe pulmonary nodularity not visualized. 7. Right glenoid metastasis, present, image 18 is series 4 FINDINGS: Mediastinum: Normal heart size. No pericardial effusion. Aortic atherosclerosis noted. Calcification within the LAD and RCA coronary artery  noted. The trachea appears patent and midline. Mild diffuse wall thickening involving the esophagus. The main pulmonary artery is patent. There is abnormal filling defect within the left dx lower lobe pulmonary artery, image 120 of series 10. Lungs/Pleura: New moderate left pleural effusion with overlying compressive type atelectasis and consolidation. There has been slight increase in volume of loculated effusion overlying the posterior right lower lobe. There is progressive soft tissue consolidation and interstitial thickening involving the right mid and lower lung zone. Similar appearance of masslike consolidation overlying the right lung apex. Upper Abdomen: No focal liver abnormality. The liver has a nodular contour or and there is hypertrophy of the caudate lobe and lateral segment of left lobe. No adrenal abnormality identified. The visualized portions of the pancreas and spleen are normal. Musculoskeletal: Her mid to metastasis involving the right glenoid and scapula is again identified. Subjectively, there appears to be progression of this  lesion with further loss of bone. Expansile lytic lesion involving the lateral aspect of the right 6 rib is identified with surrounding soft tissue component measures 3.4 by 2.3 cm, image 56 of series 4. On the previous exam this measured 2.1 x 3.4 cm. Lytic lesion involving the pedicle of T11 measures 3.7 cm, image 89 of series 4. Previously 3.5 cm. a left-sided canal involvement is suspected. Cannot rule out spinal stenosis at this level. Review of the MIP images confirms the above findings. IMPRESSION: 1. Examination is positive for acute pulmonary embolus to the left lower lobe. 2. New moderate volume left pleural effusion and slight increase in volume of loculated right effusion. 3. Worsening aeration to the right lung. 4. Mild progression of multi focal lytic bone metastasis 5. Aortic atherosclerosis and coronary artery disease. Critical Value/emergent results were  called by telephone at the time of interpretation on 08/25/2016 at 2:12 pm to Dr. Virgel Manifold , who verbally acknowledged these results. Electronically Signed   By: Kerby Moors M.D.   On: 08/25/2016 14:12    ASSESSMENT AND PLAN: This is a very pleasant 81 years old white male with history of stage IV non-small cell lung cancer, adenocarcinoma status post induction chemotherapy was carboplatin and Alimta and currently undergoing treatment with immunotherapy according to the [facility] [place] 370 clinical trial. He was randomized to the Nivolumab arm 240 mg IV every 2 weeks. He is status post 31 cycles and tolerating his treatment fairly well.  I recommended for the patient to continue his treatment with Nivolumab and he will proceed with cycle #32 today as a scheduled.  The patient would come back for follow-up visit in 2 weeks for reevaluation before starting cycle #33.  For the left pleural effusion, I will send the patient to interventional radiology for consideration of therapeutic and diagnostic left-sided thoracentesis. He may need to hold his treatment with Eliquis 4 1-2 days before the procedure. For the newly diagnosed pulmonary embolism, the patient will continue his treatment with Eliquis. For depression and lack of appetite, he will continue on Remeron 30 mg by mouth daily at bedtime. For the left-sided chest pain, he will continue on tramadol 50 mg by mouth every 6 hours as needed for pain. The patient was advised to call immediately if he has any concerning symptoms in the interval. The patient voices understanding of current disease status and treatment options and is in agreement with the current care plan.  All questions were answered. The patient knows to call the clinic with any problems, questions or concerns. We can certainly see the patient much sooner if necessary.  Disclaimer: This note was dictated with voice recognition software. Similar sounding words can inadvertently be  transcribed and may not be corrected upon review.

## 2016-08-30 NOTE — Progress Notes (Signed)
BMS 370: Group A, Arm B3; Cycle 32/Week 62 I met with patient and spouse today, prior to patient's scheduled appointment with MD and after his lab appointment. Patient here today for MD assessment and start of cycle 32 on study with Nivolumab. Vital signs completed and patient with a 6.4 pound weight loss since his last cycle on August 30th, with patient having approximately 5.5% weight loss from his study baseline, he confirms decrease in appetite and continues with protein shakes. Patient was hospitalized on Friday and discharged on Sunday 9/10 after ED CT chest scan resulted positive for  "acute pulmonary embolus to the left lower lobe." Patient is now on Eliquis, started 9/10 for embolus and also was found with a DVT to RLE upon evaluation in ED, as well. Patient's CT scan revealed "new moderate volume left pleural effusion and slight increase in volume of loculated right lung." An ePIP was submitted to study to clarify whether patient was eligible to continue on study due to this new left pleural effusion. Upon clarification with BMS 370 medical monitor Dr. Charlyne Quale, with "no documentation showing that effusion is malignant and representing true progression the subject can continue treatment for now," Dr. Julien Nordmann was informed of Dr. Alene Mires reply. Patient is currently on 3L of oxygen via nasal canula and he reports his breathing to be much better, and his saturation is at 98%. Patient denies any fevers, denies vomiting and confirms occasional nausea. Patient denies having any pain today, but does confirm to have the occasional pain continue in the Left flank/chest region. Patient positive with trace edema to right lower extremity and is presently wearing a Ted hose. Patient also reports to have had a cyst,  that is located over his adam's apple, expressed while in hospital, he confirms he's had this cyst for approximately 30+ years and states he was trying to express himself at home with some luck. While in  hospital this cyst was expressed and patient was placed on Keflex 9/10.  Patient confirms continuation of fatigue and does report to not be very active, spends most of day sitting in his recliner. Patient does have noticeable bruising to right arm, he reports that now when he just "bumps" his arm on anything he bruises and I informed him that this more than likely related to being on the blood thinner, Eliquis, patient voiced understanding. Dr. Julien Nordmann reviewed with patient and spouse CT scan results and the new left pleural effusion and informed patient that he will place an order for patient to have an US thoracentesis to check for new cancer. Patient knows to expect call from scheduling for CT of Abdomen and Pelvis, required for study week 65, and the US Thoracentesis. Per Dr. Worthy Flank physical assessment of patient today and review of patient's lab results as well as NCS labs, patient cleared for cycle 32 of Nivolumab today. All patient's and spouses questions answered to their satisfaction, patient and spouse strongly encourged to contact Dr. Julien Nordmann, clinic or myself with any questions or concerns. Patient and spouse thanked for their continued support of study.  Study generated drug vial assignment provided to PharmD Carolynne Edouard. Infusion treatment assignment provided to RN May A. All questions answered.   Reviewed patient's hospitalization for acute pulmonary embolus to the left lower lobe with Dr. Julien Nordmann, and per Dr. Julien Nordmann this AE is not related to patient being treated with Nivolumab and cause is unknown. SAE was completed with study for documentation of hospitalization.   I reviewed study consent  form, Version 3.1, dated 07/24/16, with patient and spouse and informed them that the only change reflected in this new version consent form is our cancer clinic's physical address and that we would need to have this new consent form signed to reflect that patient has been informed of this change.  After review of this information and all patient's and spouse's questions answered to their satisfaction the consent was given to patient to sign. Patient could not initial and date each page do inability to see the small lines, patient's spouse had initialed and dated each page, for patient, where indicated in my and patient's presence and patient signed the pages where his signature was indicated. Patient was provided with a copy for his records.  Adele Dan, RN, BSN Clinical Research 08/30/2016 3:59 PM

## 2016-08-30 NOTE — Progress Notes (Signed)
Influenza vaccine okay to give per Dr. Earlie Server and Rubin Payor, research RN. Vaccination is not a live virus, so should not cause incident with research trial.

## 2016-08-30 NOTE — Telephone Encounter (Signed)
Contrast given to patuent per CT ordered. Message sent to chemo scheduler to add chemo per los 08/30/16. Labs and Flush appointments added.

## 2016-08-30 NOTE — Patient Instructions (Signed)
[facility] Discharge Instructions for Patients Receiving Chemotherapy  Today you received the following chemotherapy agents:  Opdivo (nivolumab)  To help prevent nausea and vomiting after your treatment, we encourage you to take your nausea medication as prescribed.   If you develop nausea and vomiting that is not controlled by your nausea medication, call the clinic.   BELOW ARE SYMPTOMS THAT SHOULD BE REPORTED IMMEDIATELY:  *FEVER GREATER THAN 100.5 F  *CHILLS WITH OR WITHOUT FEVER  NAUSEA AND VOMITING THAT IS NOT CONTROLLED WITH YOUR NAUSEA MEDICATION  *UNUSUAL SHORTNESS OF BREATH  *UNUSUAL BRUISING OR BLEEDING  TENDERNESS IN MOUTH AND THROAT WITH OR WITHOUT PRESENCE OF ULCERS  *URINARY PROBLEMS  *BOWEL PROBLEMS  UNUSUAL RASH Items with * indicate a potential emergency and should be followed up as soon as possible.  Feel free to call the clinic you have any questions or concerns. The clinic phone number is [phone].  Please show the [place] at check-in to the Emergency Department and triage nurse.

## 2016-08-31 ENCOUNTER — Telehealth: Payer: Self-pay | Admitting: *Deleted

## 2016-08-31 NOTE — Telephone Encounter (Signed)
Per LOS I have scheduled appts and notified the scheduler 

## 2016-09-04 ENCOUNTER — Ambulatory Visit (HOSPITAL_COMMUNITY)
Admission: RE | Admit: 2016-09-04 | Discharge: 2016-09-04 | Disposition: A | Discharge status: Home / Self Care | Payer: Medicare Other | Source: Ambulatory Visit | Attending: Radiology | Admitting: Radiology

## 2016-09-04 ENCOUNTER — Ambulatory Visit (HOSPITAL_COMMUNITY)
Admission: RE | Admit: 2016-09-04 | Discharge: 2016-09-04 | Disposition: A | Discharge status: Home / Self Care | Payer: Medicare Other | Source: Ambulatory Visit | Attending: Internal Medicine | Admitting: Internal Medicine

## 2016-09-04 DIAGNOSIS — C782 Secondary malignant neoplasm of pleura: Secondary | ICD-10-CM | POA: Diagnosis not present

## 2016-09-04 DIAGNOSIS — J90 Pleural effusion, not elsewhere classified: Secondary | ICD-10-CM

## 2016-09-04 DIAGNOSIS — Z9221 Personal history of antineoplastic chemotherapy: Secondary | ICD-10-CM | POA: Insufficient documentation

## 2016-09-04 DIAGNOSIS — Z9889 Other specified postprocedural states: Secondary | ICD-10-CM | POA: Insufficient documentation

## 2016-09-04 DIAGNOSIS — Z5112 Encounter for antineoplastic immunotherapy: Secondary | ICD-10-CM

## 2016-09-04 DIAGNOSIS — C3411 Malignant neoplasm of upper lobe, right bronchus or lung: Secondary | ICD-10-CM | POA: Diagnosis present

## 2016-09-04 NOTE — Discharge Summary (Signed)
Physician Discharge Summary  Omar Peters DTO:[id number] DOB: 05/21/1935 DOA: 08/25/2016  PCP: Tamsen Roers, MD  Admit date: 08/25/2016 Discharge date: 09/04/2016  Admitted From: home  Disposition:  home   Recommendations for Outpatient Follow-up:  1. Surgery f/u for abscess if it does not resolve.   Discharge Condition:  stable   CODE STATUS:  Full code   Diet recommendation:  Heart healthy Consultations:  Phone consults with heme/onc and gen surgery    Discharge Diagnoses:  Principal Problem:   Acute hypoxemic respiratory failure ([place]) Active Problems:   Pleural effusion   Acute pulmonary embolism (H[facility])   Right leg DVT (HCC)   Bone metastasis ([place])   Cancer of upper lobe of right lung (HCC)   Hypertension   Weakness   Hypoalbuminemia   Weight loss   Diabetes mellitus ([facility])    Subjective: Dyspnea improving. No cough or chest pain.   Brief Summary: Omar Peters a 81 y.o.malewith medical history significant of stage IV lung cancer, diabetes, hypertension, presents to the emergency room with a chief complaint of shortness of breath and left-sided chest pain. Patient states that he has been having chest pain for the past couple of months, however in the last 3-4 days patient has gotten worse. States that it is getting worse when taking a deep breath. He also is complaining of shortness of breath for quite sometime however in the last few days has gotten a lotworse. He also complains of productive cough. He has been noticing swelling in his right lower extremity for the past week. He denies any palpitations. He denies any abdominal pain, no nausea, vomiting or diarrhea. He is currently getting chemotherapy  Hospital Course:  Principal Problem:   Acute hypoxemic respiratory failure  (A) PE-   Lovenox started initially for treatment -  Have asked for hematology opinion on what would would be best for anticoagulation and have discussed this with Dr Alvy Bimler who is  recommending Elilquis or Xarelto- have switched Lovenox to Eliquis  - will ned to be discharged with O2  (B) b/l pleural effusions- not a new issue- likely malignant with loculations-- in the past he has had a pleurex- would hold off on a thoracentesis at this point - effusions need to be followed closely as outpt  (C) cancer of right lung  Active Problems:  Right leg DVT  - extensive clot in right leg- TEDS need to be worn and patient has these already at home  Cyst on neck>> Abscess - patient states he has had a chronic cyst on his neck for about 30 yrs but lately he has been picking on it and it has begun to drain.  - on my exam today, I was able to express pus from it - have spoken with gen surgery about I and D today - gen surgery recommended to ask patient to do warm compresses, express as much material as he can and to place him on Keflex- he should follow up in the gen surgery office in 1 wk. If the abscess does not resolve    Cancer of upper lobe of right lung with Bone metastasis  - under care of Dr Julien Nordmann - receiving chemo with Nivolumab  Left sided chest pain - has had this for 6 months and has been prescribed Tramadol for it    Hypertension - Norvasc    Hypoalbuminemia/   Weight loss/ generalized weakness - due to underlying cancer    Diabetes mellitus  - held Metformin in  the hospital due to contrast use- - can resume tomorrow  Discharge Instructions  Discharge Instructions    Diet - low sodium heart healthy    Complete by:  As directed    Increase activity slowly    Complete by:  As directed        Medication List    TAKE these medications   amitriptyline 10 MG tablet Commonly known as:  ELAVIL Take 1 tablet (10 mg total) by mouth at bedtime.   amLODipine 10 MG tablet Commonly known as:  NORVASC Take 10 mg by mouth daily.   apixaban 5 MG Tabs tablet Commonly known as:  ELIQUIS Take 2 tablets (10 mg total) by mouth 2 (two) times  daily.   apixaban 5 MG Tabs tablet Commonly known as:  ELIQUIS Take 1 tablet (5 mg total) by mouth 2 (two) times daily.   B-COMPLEX PO Take 1 each by mouth daily. Reported on 03/01/2016   cephALEXin 500 MG capsule Commonly known as:  KEFLEX Take 1 capsule (500 mg total) by mouth every 6 (six) hours.   feeding supplement (ENSURE ENLIVE) Liqd Take 237 mLs by mouth 2 (two) times daily between meals.   HYDROcodone-acetaminophen 5-325 MG tablet Commonly known as:  NORCO Take 1 tablet by mouth every 6 (six) hours as needed for moderate pain.   lidocaine-prilocaine cream Commonly known as:  EMLA Apply 1 application topically as needed. Pt is to apply cream 1 hour before receiving chemo   magnesium hydroxide 400 MG/5ML suspension Commonly known as:  MILK OF MAGNESIA Take 30 mLs by mouth daily as needed for mild constipation.   metFORMIN 500 MG tablet Commonly known as:  GLUCOPHAGE Take 500 mg by mouth daily.   mirtazapine 30 MG tablet Commonly known as:  REMERON Take 1 tablet by mouth at  bedtime What changed:  See the new instructions.   PRESCRIPTION MEDICATION Place 1 each into the right eye every 8 (eight) weeks. Eye injection.   prochlorperazine 10 MG tablet Commonly known as:  COMPAZINE Take 1 tablet (10 mg total) by mouth every 6 (six) hours as needed for nausea or vomiting.   ranitidine 150 MG tablet Commonly known as:  ZANTAC Take 150 mg by mouth daily.   SENNA S 8.6-50 MG tablet Generic drug:  senna-docusate Take 2 tablets by mouth daily.   traMADol 50 MG tablet Commonly known as:  ULTRAM Take 1 tablet (50 mg total) by mouth every 6 (six) hours as needed. What changed:  reasons to take this      [place] .   Why:  will deliver oxygen to your home  Contact information: [place] [address] [phone]          Allergies  Allergen Reactions  . Marinol [Dronabinol] Other (See  Comments)    Confusion He does not take this any more     Procedures/Studies:  Dg Chest 2 View  Result Date: 08/25/2016 CLINICAL DATA:  Productive cough, shortness of breath. EXAM: CHEST  2 VIEW COMPARISON:  Radiographs of April 12, 2015. CT scan of July 12, 2016. FINDINGS: Stable cardiomediastinal silhouette. Interval placement of right internal jugular Port-A-Cath with distal tip in expected position of cavoatrial junction. No pneumothorax is noted. Mild left basilar subsegmental atelectasis is noted. Stable mild loculated right pleural effusion is noted. Right basilar opacity is noted concerning for atelectasis, edema, inflammation or possibly scarring. Bony thorax is unremarkable. IMPRESSION: Mild left  basilar subsegmental atelectasis. Interval placement of right internal jugular Port-A-Cath. Stable mild loculated right pleural effusion is noted. Probable stable right basilar opacity is noted as described above. Electronically Signed   By: Marijo Conception, M.D.   On: 08/25/2016 11:35   Ct Angio Chest Pe W And/or Wo Contrast  Result Date: 08/25/2016 CLINICAL DATA:  Metastatic lung cancer.  Shortness of breath. EXAM: CT ANGIOGRAPHY CHEST WITH CONTRAST TECHNIQUE: Multidetector CT imaging of the chest was performed using the standard protocol during bolus administration of intravenous contrast. Multiplanar CT image reconstructions and MIPs were obtained to evaluate the vascular anatomy. CONTRAST:  57 cc of Isovue 370 COMPARISON:  07/12/2016 RECIST 1.1 Target Lesions: 1. Mass-like area of architectural distortion in the medial aspect of the right upper lobe measures 6.6 x 3.3 cm (image 27 of series 4) previously 6.6 x 3.9 cm Non-target Lesions: 1.  7 mm short axis AP window lymph node (image 34of series 4), stable. 2. 8 mm short axis subcarinal lymph node (image 40 of series 4), previously 9 mm. 3. 11 mm short axis low right paratracheal lymph node (image 35 of series 4), stable. 5. Small chronic  thick-walled rim enhancing right pleural effusion, Slightly increased in volume from previous exam, image 60 of series 4. 6. Left lower lobe pulmonary nodularity not visualized. 7. Right glenoid metastasis, present, image 18 is series 4 FINDINGS: Mediastinum: Normal heart size. No pericardial effusion. Aortic atherosclerosis noted. Calcification within the LAD and RCA coronary artery noted. The trachea appears patent and midline. Mild diffuse wall thickening involving the esophagus. The main pulmonary artery is patent. There is abnormal filling defect within the left dx lower lobe pulmonary artery, image 120 of series 10. Lungs/Pleura: New moderate left pleural effusion with overlying compressive type atelectasis and consolidation. There has been slight increase in volume of loculated effusion overlying the posterior right lower lobe. There is progressive soft tissue consolidation and interstitial thickening involving the right mid and lower lung zone. Similar appearance of masslike consolidation overlying the right lung apex. Upper Abdomen: No focal liver abnormality. The liver has a nodular contour or and there is hypertrophy of the caudate lobe and lateral segment of left lobe. No adrenal abnormality identified. The visualized portions of the pancreas and spleen are normal. Musculoskeletal: Her mid to metastasis involving the right glenoid and scapula is again identified. Subjectively, there appears to be progression of this lesion with further loss of bone. Expansile lytic lesion involving the lateral aspect of the right 6 rib is identified with surrounding soft tissue component measures 3.4 by 2.3 cm, image 56 of series 4. On the previous exam this measured 2.1 x 3.4 cm. Lytic lesion involving the pedicle of T11 measures 3.7 cm, image 89 of series 4. Previously 3.5 cm. a left-sided canal involvement is suspected. Cannot rule out spinal stenosis at this level. Review of the MIP images confirms the above  findings. IMPRESSION: 1. Examination is positive for acute pulmonary embolus to the left lower lobe. 2. New moderate volume left pleural effusion and slight increase in volume of loculated right effusion. 3. Worsening aeration to the right lung. 4. Mild progression of multi focal lytic bone metastasis 5. Aortic atherosclerosis and coronary artery disease. Critical Value/emergent results were called by telephone at the time of interpretation on 08/25/2016 at 2:12 pm to Dr. Virgel Manifold , who verbally acknowledged these results. Electronically Signed   By: Kerby Moors M.D.   On: 08/25/2016 14:12  Discharge Exam: Vitals:   08/27/16 0536 08/27/16 1325  BP: (!) 148/78 138/68  Pulse: 100 (!) 102  Resp: 16 18  Temp: 98 F (36.7 C) 98.1 F (36.7 C)   Vitals:   08/26/16 1431 08/26/16 2100 08/27/16 0536 08/27/16 1325  BP: 138/72 134/66 (!) 148/78 138/68  Pulse: 98 95 100 (!) 102  Resp: '16 16 16 18  '$ Temp: 97.1 F (36.2 C) 98.6 F (37 C) 98 F (36.7 C) 98.1 F (36.7 C)  TempSrc: Oral Oral Oral Oral  SpO2: 98% 99% 96% 99%  Weight:      Height:        General: Pt is alert, awake, not in acute distress Cardiovascular: RRR, S1/S2 +, no rubs, no gallops Respiratory: CTA bilaterally, no wheezing, no rhonchi Abdominal: Soft, NT, ND, bowel sounds + Extremities: no edema, no cyanosis    The results of significant diagnostics from this hospitalization (including imaging, microbiology, ancillary and laboratory) are listed below for reference.     Microbiology: No results found for this or any previous visit (from the past 240 hour(s)).   Labs: BNP (last 3 results)  Recent Labs  08/25/16 1313  BNP 96.2   Basic Metabolic Panel:  Recent Labs Lab 08/30/16 1312 08/30/16 1318  NA 141  --   K 3.6  --   CO2 28  --   GLUCOSE 138  --   BUN 9.9  --   CREATININE 0.8  --   CALCIUM 9.3  --   MG 2.1  --   PHOS  --  3.6   Liver Function Tests:  Recent Labs Lab 08/30/16 1312   AST 22  ALT 19  ALKPHOS 143  BILITOT 0.34  PROT 6.7  ALBUMIN 2.6*   No results for input(s): LIPASE, AMYLASE in the last 168 hours. No results for input(s): AMMONIA in the last 168 hours. CBC:  Recent Labs Lab 08/30/16 1312  WBC 9.6  NEUTROABS 6.4  HGB 13.7  HCT 40.4  MCV 97.8  PLT 264   Cardiac Enzymes: No results for input(s): CKTOTAL, CKMB, CKMBINDEX, TROPONINI in the last 168 hours. BNP: Invalid input(s): POCBNP CBG: No results for input(s): GLUCAP in the last 168 hours. D-Dimer No results for input(s): DDIMER in the last 72 hours. Hgb A1c No results for input(s): HGBA1C in the last 72 hours. Lipid Profile No results for input(s): CHOL, HDL, LDLCALC, TRIG, CHOLHDL, LDLDIRECT in the last 72 hours. Thyroid function studies No results for input(s): TSH, T4TOTAL, T3FREE, THYROIDAB in the last 72 hours.  Invalid input(s): FREET3 Anemia work up No results for input(s): VITAMINB12, FOLATE, FERRITIN, TIBC, IRON, RETICCTPCT in the last 72 hours. Urinalysis    Component Value Date/Time   COLORURINE YELLOW 12/01/2015 2022   APPEARANCEUR CLEAR 12/01/2015 2022   LABSPEC 1.026 12/01/2015 2022   PHURINE 8.0 12/01/2015 2022   GLUCOSEU NEGATIVE 12/01/2015 2022   HGBUR NEGATIVE 12/01/2015 2022   [place] NEGATIVE 12/01/2015 2022   KETONESUR NEGATIVE 12/01/2015 2022   PROTEINUR NEGATIVE 12/01/2015 2022   NITRITE NEGATIVE 12/01/2015 2022   LEUKOCYTESUR NEGATIVE 12/01/2015 2022   Sepsis Labs Invalid input(s): PROCALCITONIN,  WBC,  LACTICIDVEN Microbiology No results found for this or any previous visit (from the past 240 hour(s)).   Time coordinating discharge: Over 30 minutes  SIGNED:   Debbe Odea, MD  [facility] 09/04/2016, 10:46 AM Pager   If 7PM-7AM, please contact night-coverage [email or web address] Password [email or web address]

## 2016-09-04 NOTE — Procedures (Signed)
Ultrasound-guided diagnostic and therapeutic left thoracentesis performed yielding 850 cc of slightly hazy, yellow  fluid. No immediate complications. Follow-up chest x-ray pending.The fluid was sent to the lab for cytology.

## 2016-09-05 ENCOUNTER — Telehealth: Payer: Self-pay | Admitting: *Deleted

## 2016-09-05 NOTE — Telephone Encounter (Signed)
Oncology Nurse Navigator Documentation  Oncology Nurse Navigator Flowsheets 09/05/2016  Navigator Location [facility]  Navigator Encounter Type Telephone/i updated Dr. Julien Nordmann on cytology obtained yesterday.  He stated I could call and update them.  I called.  Omar Peters asked about next steps.  I stated Dr. Julien Nordmann would go over this at next visit on 9/27.    Telephone Outgoing Call  Treatment Phase Treatment  Barriers/Navigation Needs Education  Education Other  Interventions Education Method  Education Method Verbal  Acuity Level 2  Acuity Level 2 Educational needs  Time Spent with Patient 30

## 2016-09-06 ENCOUNTER — Ambulatory Visit (HOSPITAL_COMMUNITY)
Admission: RE | Admit: 2016-09-06 | Discharge: 2016-09-06 | Disposition: A | Discharge status: Home / Self Care | Payer: Medicare Other | Source: Ambulatory Visit | Attending: Internal Medicine | Admitting: Internal Medicine

## 2016-09-06 ENCOUNTER — Encounter (HOSPITAL_COMMUNITY): Payer: Self-pay

## 2016-09-06 DIAGNOSIS — Z5112 Encounter for antineoplastic immunotherapy: Secondary | ICD-10-CM

## 2016-09-06 DIAGNOSIS — I2510 Atherosclerotic heart disease of native coronary artery without angina pectoris: Secondary | ICD-10-CM | POA: Diagnosis not present

## 2016-09-06 DIAGNOSIS — Z9221 Personal history of antineoplastic chemotherapy: Secondary | ICD-10-CM | POA: Diagnosis not present

## 2016-09-06 DIAGNOSIS — C7951 Secondary malignant neoplasm of bone: Secondary | ICD-10-CM | POA: Insufficient documentation

## 2016-09-06 DIAGNOSIS — C3411 Malignant neoplasm of upper lobe, right bronchus or lung: Secondary | ICD-10-CM | POA: Insufficient documentation

## 2016-09-06 DIAGNOSIS — Z9889 Other specified postprocedural states: Secondary | ICD-10-CM | POA: Diagnosis not present

## 2016-09-06 DIAGNOSIS — R932 Abnormal findings on diagnostic imaging of liver and biliary tract: Secondary | ICD-10-CM | POA: Insufficient documentation

## 2016-09-06 DIAGNOSIS — R590 Localized enlarged lymph nodes: Secondary | ICD-10-CM | POA: Diagnosis not present

## 2016-09-06 MED ORDER — IOPAMIDOL (ISOVUE-300) INJECTION 61%
100.0000 mL | Freq: Once | INTRAVENOUS | Status: AC | PRN
  Administered 2016-09-06: 100 mL via INTRAVENOUS

## 2016-09-07 ENCOUNTER — Telehealth: Payer: Self-pay | Admitting: Medical Oncology

## 2016-09-07 DIAGNOSIS — C3411 Malignant neoplasm of upper lobe, right bronchus or lung: Secondary | ICD-10-CM

## 2016-09-07 DIAGNOSIS — R0602 Shortness of breath: Secondary | ICD-10-CM

## 2016-09-07 NOTE — Telephone Encounter (Signed)
Wife called and I talked to pt who states he is more short of breath and concerned the fluid in in lungs has recurred.  Thoracentesis done tuesday with approx 800 cc drained. Wife checked o2 sat while I was on phone and it was 96% on 2 liters Pt agreed to cxr , labs and  [facility] tomorrow.Schedulling message sent.

## 2016-09-08 ENCOUNTER — Encounter: Payer: Self-pay | Admitting: Nurse Practitioner

## 2016-09-08 ENCOUNTER — Other Ambulatory Visit (HOSPITAL_BASED_OUTPATIENT_CLINIC_OR_DEPARTMENT_OTHER): Payer: Medicare Other

## 2016-09-08 ENCOUNTER — Ambulatory Visit (HOSPITAL_COMMUNITY)
Admission: RE | Admit: 2016-09-08 | Discharge: 2016-09-08 | Disposition: A | Discharge status: Home / Self Care | Payer: Medicare Other | Source: Ambulatory Visit | Attending: Nurse Practitioner | Admitting: Nurse Practitioner

## 2016-09-08 ENCOUNTER — Ambulatory Visit (HOSPITAL_BASED_OUTPATIENT_CLINIC_OR_DEPARTMENT_OTHER): Payer: Medicare Other | Admitting: Nurse Practitioner

## 2016-09-08 ENCOUNTER — Other Ambulatory Visit: Payer: Self-pay | Admitting: Medical Oncology

## 2016-09-08 VITALS — BP 122/68 | HR 104 | Temp 98.2°F | Resp 18 | Ht 66.0 in | Wt 132.5 lb

## 2016-09-08 DIAGNOSIS — C3411 Malignant neoplasm of upper lobe, right bronchus or lung: Secondary | ICD-10-CM

## 2016-09-08 DIAGNOSIS — Z79899 Other long term (current) drug therapy: Secondary | ICD-10-CM

## 2016-09-08 DIAGNOSIS — R0602 Shortness of breath: Secondary | ICD-10-CM

## 2016-09-08 DIAGNOSIS — J90 Pleural effusion, not elsewhere classified: Secondary | ICD-10-CM | POA: Insufficient documentation

## 2016-09-08 DIAGNOSIS — I82401 Acute embolism and thrombosis of unspecified deep veins of right lower extremity: Secondary | ICD-10-CM

## 2016-09-08 DIAGNOSIS — Z7901 Long term (current) use of anticoagulants: Secondary | ICD-10-CM | POA: Insufficient documentation

## 2016-09-08 DIAGNOSIS — I2699 Other pulmonary embolism without acute cor pulmonale: Secondary | ICD-10-CM | POA: Diagnosis not present

## 2016-09-08 DIAGNOSIS — L0211 Cutaneous abscess of neck: Secondary | ICD-10-CM

## 2016-09-08 LAB — COMPREHENSIVE METABOLIC PANEL
ALBUMIN: 2.8 g/dL — AB (ref 3.5–5.0)
ALK PHOS: 169 U/L — AB (ref 40–150)
ALT: 16 U/L (ref 0–55)
AST: 20 U/L (ref 5–34)
Anion Gap: 10 mEq/L (ref 3–11)
BILIRUBIN TOTAL: 0.38 mg/dL (ref 0.20–1.20)
BUN: 11.7 mg/dL (ref 7.0–26.0)
CALCIUM: 9.8 mg/dL (ref 8.4–10.4)
CO2: 29 mEq/L (ref 22–29)
Chloride: 102 mEq/L (ref 98–109)
Creatinine: 0.8 mg/dL (ref 0.7–1.3)
EGFR: 85 mL/min/{1.73_m2} — ABNORMAL LOW (ref >90)
Glucose: 112 mg/dl (ref 70–140)
POTASSIUM: 3.6 meq/L (ref 3.5–5.1)
Sodium: 141 mEq/L (ref 136–145)
TOTAL PROTEIN: 7.3 g/dL (ref 6.4–8.3)

## 2016-09-08 LAB — CBC WITH DIFFERENTIAL/PLATELET
BASO%: 0.9 % (ref 0.0–2.0)
BASOS ABS: 0.1 10*3/uL (ref 0.0–0.1)
EOS ABS: 0.3 10*3/uL (ref 0.0–0.5)
EOS%: 3.3 % (ref 0.0–7.0)
HEMATOCRIT: 42.5 % (ref 38.4–49.9)
HEMOGLOBIN: 14.4 g/dL (ref 13.0–17.1)
LYMPH#: 1.8 10*3/uL (ref 0.9–3.3)
LYMPH%: 18.5 % (ref 14.0–49.0)
MCH: 33.1 pg (ref 27.2–33.4)
MCHC: 33.9 g/dL (ref 32.0–36.0)
MCV: 97.7 fL (ref 79.3–98.0)
MONO#: 0.9 10*3/uL (ref 0.1–0.9)
MONO%: 9.5 % (ref 0.0–14.0)
NEUT#: 6.6 10*3/uL — ABNORMAL HIGH (ref 1.5–6.5)
NEUT%: 67.8 % (ref 39.0–75.0)
Platelets: 249 10*3/uL (ref 140–400)
RBC: 4.35 10*6/uL (ref 4.20–5.82)
RDW: 13.3 % (ref 11.0–14.6)
WBC: 9.7 10*3/uL (ref 4.0–10.3)

## 2016-09-08 NOTE — Assessment & Plan Note (Addendum)
Patient stated that he was admitted to the hospital last week; and was noted per the hospitalist to have a very large anterior neck abscess.  Patient states that the abscesses been there for approximately 35-40 years.  The hospitalist opened and drained the abscess while patient was admitted.  Patient states that the site continues to do some purulent drainage; but that it is much smaller than it was originally.  He continues to take Keflex antibiotics.  It was initially given to him by the hospitalist.  Exam today reveals an approximately 2 cm in diameter abscess to the anterior neck that is slightly draining some purulent drainage.  Was able to remove a small amount of purulent fluid from the site with manual expulsion.  Wound culture was obtained of fluid.  Was not able to completely remove all loculations to the neck abscess; since patient continues with Eliquis anticoagulation.  Patient was advised to use warm, moist compresses to the site.  Several times per day.  Patient was also advised to call/return or go directed to the emergency department for any worsening symptoms whatsoever.

## 2016-09-08 NOTE — Assessment & Plan Note (Addendum)
Patient continues to undergo the "Checkmated 370" clinical trial of nivolumab.  He is scheduled to return on 09/13/2016 for labs, flush, visit, and his next cycle of immunotherapy.  Also, patient underwent a restaging CT of his chest, 6 abdomen, and pelvis earlier this week as well.  Briefly reviewed all CT results with Dr. Benay Spice; and then reviewed all CT results once again with both patient and his wife.  Confirmed to both the patient and his wife that the patient's CT results were essentially stable.  Also, confirmed that the pleural fluid removed via thoracentesis of the left lung earlier this week did reveal that it was malignant.

## 2016-09-08 NOTE — Progress Notes (Signed)
SYMPTOM MANAGEMENT CLINIC    Chief Complaint: Dyspnea, neck abscess  HPI:  Omar Peters 81 y.o. male diagnosed with lung cancer with bone metastasis.  Currently undergoing the checkmated 370 clinical trial of nivolumab.   Oncology History   Patient presented with cough and mild SOB.  Work up CXR abnormal.  Cancer of upper lobe of right lung   Staging form: Lung, AJCC 7th Edition     Clinical stage from 12/31/2014: Stage IV (T3, N3, M1b) - Signed by Curt Bears, MD on 01/12/2015       Staging comments: Adenocarcinoma           Cancer of upper lobe of right lung ([place])   12/16/2014 Imaging    CT Chest IMPRESSION: 1. Findings, as above, highly concerning for primary right upper lobe bronchogenic carcinoma with right hilar and bilateral mediastinal lymphadenopathy, as well as a probable malignant right-sided pleural effusion.       12/29/2014 Imaging    PET scan IMPRESSION: 1. Hypermetabolic right upper lobe mass consistent with primary bronchogenic carcinoma. 2. Hypermetabolic mediastinal nodal metastasis. 3. Large right pleural effusion. 4. Hypermetabolic skeletal metastasis involving the right       12/31/2014 Initial Diagnosis    Cancer of upper lobe of right lung      01/01/2015 Procedure    IMPRESSION: Successful ultrasound-guided right sided thoracentesis yielding 900 ml of pleural fluid.      01/07/2015 Pathology Results    Bone, biopsy, Right iliac - METASTATIC ADENOCARCINOMA      01/07/2015 Procedure    IMPRESSION: Successful CT-guided core biopsy of a right iliac bone lesion.      01/11/2015 Imaging    MRI Brain with and without contrast IMPRESSION: 1. No evidence of intracranial metastases. 2. Mild-to-moderate chronic small vessel ischemic disease      01/15/2015 Surgery    SURGICAL PROCEDURE: Placement of right PleurX catheter with ultrasound and fluro guidance.      01/20/2015 -  Chemotherapy    1st chemotherapy carboplatin and alimta        Review of Systems  Constitutional: Positive for malaise/fatigue and weight loss.  Respiratory: Positive for shortness of breath.        Patient experienced increase shortness of breath yesterday; but states that all shortness breath has resolved at this time.  Skin:       Anterior neck abscess  All other systems reviewed and are negative.   Past Medical History:  Diagnosis Date  . Anxiety    due to diagnosis of Stage 4 Lung Cancer  . Bone cancer ([place])    non small cell lung with mets to bone  . Diabetes mellitus without complication ([place])   . GERD (gastroesophageal reflux disease)   . Hypertension   . Lung cancer ([place])    stage IV non small cell lung cancer dx. January 2016   . Macular degeneration of right eye   . Shortness of breath dyspnea    01/14/15 at night and has to sit up    Past Surgical History:  Procedure Laterality Date  . CHEST TUBE INSERTION Right 01/15/2015   Procedure: INSERTION PLEURAL DRAINAGE CATHETER WITH ULTRASOUND AND FLURO GUIDANCE;  Surgeon: Grace Isaac, MD;  Location: [facility];  Service: Thoracic;  Laterality: Right;  . REMOVAL OF PLEURAL DRAINAGE CATHETER Right 03/26/2015   Procedure: REMOVAL OF PLEURAL DRAINAGE CATHETER;  Surgeon: Grace Isaac, MD;  Location: [facility];  Service: Thoracic;  Laterality: Right;  .  [place]    has Cough; Bone metastasis ([place]); Cancer of upper lobe of right lung ([place]); Hypertension; Hyperglycemia; Anorexia; Nausea with vomiting; Constipation; Pleural effusion; Weakness; Hypoalbuminemia; Weight loss; Orthostatic hypotension; Encounter for antineoplastic immunotherapy; Aphasia; Acute encephalopathy; Port catheter in place; Acute pulmonary embolism ([place]); Diabetes mellitus ([place]); Acute hypoxemic respiratory failure ([place]); Right leg DVT ([place]); Long term current use of anticoagulant therapy; and Neck abscess on his problem list.    is allergic to marinol [dronabinol].    Medication List        Accurate as of 09/08/16  3:12 PM. Always use your most recent med list.          amitriptyline 10 MG tablet Commonly known as:  ELAVIL Take 1 tablet (10 mg total) by mouth at bedtime.   amLODipine 10 MG tablet Commonly known as:  NORVASC Take 10 mg by mouth daily.   apixaban 5 MG Tabs tablet Commonly known as:  ELIQUIS Take 2 tablets (10 mg total) by mouth 2 (two) times daily.   apixaban 5 MG Tabs tablet Commonly known as:  ELIQUIS Take 1 tablet (5 mg total) by mouth 2 (two) times daily.   B-COMPLEX PO Take 1 each by mouth daily. Reported on 03/01/2016   cephALEXin 500 MG capsule Commonly known as:  KEFLEX Take 1 capsule (500 mg total) by mouth every 6 (six) hours.   feeding supplement (ENSURE ENLIVE) Liqd Take 237 mLs by mouth 2 (two) times daily between meals.   HYDROcodone-acetaminophen 5-325 MG tablet Commonly known as:  NORCO Take 1 tablet by mouth every 6 (six) hours as needed for moderate pain.   lidocaine-prilocaine cream Commonly known as:  EMLA Apply 1 application topically as needed. Pt is to apply cream 1 hour before receiving chemo   magnesium hydroxide 400 MG/5ML suspension Commonly known as:  MILK OF MAGNESIA Take 30 mLs by mouth daily as needed for mild constipation.   metFORMIN 500 MG tablet Commonly known as:  GLUCOPHAGE Take 500 mg by mouth daily.   mirtazapine 30 MG tablet Commonly known as:  REMERON Take 1 tablet by mouth at  bedtime   PRESCRIPTION MEDICATION Place 1 each into the right eye every 8 (eight) weeks. Eye injection.   prochlorperazine 10 MG tablet Commonly known as:  COMPAZINE Take 1 tablet (10 mg total) by mouth every 6 (six) hours as needed for nausea or vomiting.   ranitidine 150 MG tablet Commonly known as:  ZANTAC Take 150 mg by mouth daily.   SENNA S 8.6-50 MG tablet Generic drug:  senna-docusate Take 2 tablets by mouth daily.   traMADol 50 MG tablet Commonly known as:  ULTRAM Take 1 tablet (50 mg total) by  mouth every 6 (six) hours as needed.        PHYSICAL EXAMINATION  Oncology Vitals 09/08/2016 08/30/2016  Height 168 cm 168 cm  Weight 60.102 kg 61.644 kg  Weight (lbs) 132 lbs 8 oz 135 lbs 14 oz  BMI (kg/m2) 21.39 kg/m2 21.93 kg/m2  Temp 98.2 98.4  Pulse 104 114  Resp 18 18  SpO2 96 98  BSA (m2) 1.67 m2 1.69 m2   BP Readings from Last 2 Encounters:  09/08/16 122/68  09/04/16 127/69    Physical Exam  Constitutional: He is oriented to person, place, and time. He appears malnourished. He appears unhealthy. He appears cachectic.  HENT:  Head: Normocephalic and atraumatic.  Mouth/Throat: Oropharynx is clear and moist.  Eyes: Conjunctivae and EOM  are normal. Pupils are equal, round, and reactive to light. Right eye exhibits no discharge. Left eye exhibits no discharge. No scleral icterus.  Neck: Normal range of motion. Neck supple. No JVD present. No tracheal deviation present. No thyromegaly present.  Cardiovascular: Normal rate, regular rhythm, normal heart sounds and intact distal pulses.   Pulmonary/Chest: Effort normal. No respiratory distress. He has no wheezes. He has no rales. He exhibits no tenderness.  Breasts sounds clear, essentially; with some mild decreased bases bilaterally.  Patient was wearing O2 via nasal cannula initially; but then to the oxygen off.  Abdominal: Soft. Bowel sounds are normal. He exhibits no distension and no mass. There is no tenderness. There is no rebound and no guarding.  Musculoskeletal: Normal range of motion. He exhibits edema. He exhibits no tenderness or deformity.  Mild peripheral edema to the right lower extremity only.  Patient and his wife both state that the right leg edema has greatly improved recently.  No calf tenderness.  Lymphadenopathy:    He has no cervical adenopathy.  Neurological: He is alert and oriented to person, place, and time. Gait normal.  Skin: Skin is warm and dry. No rash noted. No erythema. No pallor.  Psychiatric:  Affect normal.  Nursing note and vitals reviewed.   LABORATORY DATA:. Appointment on 09/08/2016  Component Date Value Ref Range Status  . WBC 09/08/2016 9.7  4.0 - 10.3 10e3/uL Final  . NEUT# 09/08/2016 6.6* 1.5 - 6.5 10e3/uL Final  . HGB 09/08/2016 14.4  13.0 - 17.1 g/dL Final  . HCT 09/08/2016 42.5  38.4 - 49.9 % Final  . Platelets 09/08/2016 249  140 - 400 10e3/uL Final  . MCV 09/08/2016 97.7  79.3 - 98.0 fL Final  . MCH 09/08/2016 33.1  27.2 - 33.4 pg Final  . MCHC 09/08/2016 33.9  32.0 - 36.0 g/dL Final  . RBC 09/08/2016 4.35  4.20 - 5.82 10e6/uL Final  . RDW 09/08/2016 13.3  11.0 - 14.6 % Final  . lymph# 09/08/2016 1.8  0.9 - 3.3 10e3/uL Final  . MONO# 09/08/2016 0.9  0.1 - 0.9 10e3/uL Final  . Eosinophils Absolute 09/08/2016 0.3  0.0 - 0.5 10e3/uL Final  . Basophils Absolute 09/08/2016 0.1  0.0 - 0.1 10e3/uL Final  . NEUT% 09/08/2016 67.8  39.0 - 75.0 % Final  . LYMPH% 09/08/2016 18.5  14.0 - 49.0 % Final  . MONO% 09/08/2016 9.5  0.0 - 14.0 % Final  . EOS% 09/08/2016 3.3  0.0 - 7.0 % Final  . BASO% 09/08/2016 0.9  0.0 - 2.0 % Final  . Sodium 09/08/2016 141  136 - 145 mEq/L Final  . Potassium 09/08/2016 3.6  3.5 - 5.1 mEq/L Final  . Chloride 09/08/2016 102  98 - 109 mEq/L Final  . CO2 09/08/2016 29  22 - 29 mEq/L Final  . Glucose 09/08/2016 112  70 - 140 mg/dl Final  . BUN 09/08/2016 11.7  7.0 - 26.0 mg/dL Final  . Creatinine 09/08/2016 0.8  0.7 - 1.3 mg/dL Final  . Total Bilirubin 09/08/2016 0.38  0.20 - 1.20 mg/dL Final  . Alkaline Phosphatase 09/08/2016 169* 40 - 150 U/L Final  . AST 09/08/2016 20  5 - 34 U/L Final  . ALT 09/08/2016 16  0 - 55 U/L Final  . Total Protein 09/08/2016 7.3  6.4 - 8.3 g/dL Final  . Albumin 09/08/2016 2.8* 3.5 - 5.0 g/dL Final  . Calcium 09/08/2016 9.8  8.4 - 10.4 mg/dL Final  .  Anion Gap 09/08/2016 10  3 - 11 mEq/L Final  . EGFR 09/08/2016 85* >90 ml/min/1.73 m2 Final    RADIOGRAPHIC STUDIES: Dg Chest 1 View  Result Date:  09/08/2016 CLINICAL DATA:  Shortness of Breath.  Thoracentesis on Monday. EXAM: CHEST 1 VIEW COMPARISON:  CT 09/06/2016 FINDINGS: Right Port-A-Cath is in place with the tip at the cavoatrial junction. Small bilateral effusions. Diffuse airspace disease throughout the right lung. Heart is normal size. No real change since prior study. IMPRESSION: Diffuse airspace disease on the right with small bilateral effusions. No change since prior study. Electronically Signed   By: [name]   On: 09/08/2016 09:02   Ct Chest W Contrast  Result Date: 09/06/2016 CLINICAL DATA:  Restaging right lung cancer with bone metastases. EXAM: CT CHEST, ABDOMEN, AND PELVIS WITH CONTRAST TECHNIQUE: Multidetector CT imaging of the chest, abdomen and pelvis was performed following the standard protocol during bolus administration of intravenous contrast. CONTRAST:  172m ISOVUE-300 IOPAMIDOL (ISOVUE-300) INJECTION 61% COMPARISON:  CTA chest 08/25/2016. Chest abdomen and pelvis CT from 07/12/2016. FINDINGS: RECIST 1.1 Target Lesions: 1. Masslike opacities/architectural distortion and medial aspect right upper lobe measures 6.5 x 3.5 cm today compared to 6.6 x 3.9 cm previously (image 17 series 2). Non-target Lesions: 1. 8 mm short axis AP window lymph node is unchanged 8 mm 2. 9 mm short axis subcarinal lymph node is stable at 9 mm 3. 11 mm short axis low right paratracheal lymph node is stable 11 mm (image 22 series 2). 4. The chronic small thick walled rim enhancing right pleural effusion is stable. 5. Note nodularity left lower lobe. 6. Right iliac metastatic lesion is present. 7. Right glenoid metastatic involvement this scan visualized. CT CHEST FINDINGS Cardiovascular: Heart size normal. Coronary artery calcification is noted. No pericardial effusion Atherosclerotic calcification is noted in the wall of the thoracic aorta. Mediastinum/Nodes: Tiny bilateral thyroid nodules are stable and likely clinically insignificant.  Mediastinal lymphadenopathy is unchanged. There is some probable mild circumferential wall thickening in the midesophagus, stable. Lungs/Pleura: Interstitial and airspace opacity associated with volume loss in the right hemi thorax is unchanged. There is some compressive atelectasis left lower lobe, new in the interval with new associated small left pleural effusion. If this at a.m. IIA is best appreciated in the left lung. Musculoskeletal: Lytic lesion involving the left posterior elements at T12 shows evidence of epidural tumor involvement, as before. Advanced lytic lesion in the T11 vertebral body is stable. Metastatic disease is seen elsewhere in the thoracic spine, right shoulder and right ribs. CT ABDOMEN PELVIS FINDINGS Hepatobiliary: Inferior liver has a nodular contour are, raising the question of cirrhosis. There is no evidence for gallstones, gallbladder wall thickening, or pericholecystic fluid. Mild distention extrahepatic common duct up to 13 mm compares to 12 mm previously. Pancreas: No focal mass lesion. No dilatation of the main duct. No intraparenchymal cyst. No peripancreatic edema. Spleen: No splenomegaly. No focal mass lesion. Adrenals/Urinary Tract: No adrenal nodule or mass. Tiny hypo attenuating lesions in the kidneys bilaterally are too small to characterize but likely represent cysts. No evidence for hydroureter. The urinary bladder appears normal for the degree of distention. Stomach/Bowel: Stomach is unremarkable. Duodenum is normally positioned as is the ligament of Treitz. No small bowel wall thickening. No small bowel dilatation. The terminal ileum is normal. The appendix is normal. No gross colonic mass. No colonic wall thickening. No substantial diverticular change. Vascular/Lymphatic: There is abdominal aortic atherosclerosis without aneurysm. There is no gastrohepatic  or hepatoduodenal ligament lymphadenopathy. No intraperitoneal or retroperitoneal lymphadenopathy. No pelvic  sidewall lymphadenopathy. Reproductive: Prostate gland is enlarged. Other: No intraperitoneal free fluid. Musculoskeletal: Lytic lesion left iliac crest is stable. Large destructive lesion right iliac bone is unchanged. IMPRESSION: 1. Stable appearance right upper lobe lesion . 2. Stable mediastinal lymphadenopathy. 3. Stable post treatment changes right lung. The interstitial and airspace disease towards the right lower lung is indeterminate etiology but stable. This may be related to treatment FX, but lymphangitic tumor remains a consideration. 4. Stable thick walled rim enhancing right pleural fluid collection with new free-flowing left pleural fluid on today's study. 5. Destructive bony metastatic disease in the chest, abdomen, and pelvis. 6. Changes in the liver morphology compatible with cirrhosis. 7. Coronary artery atherosclerosis. Electronically Signed   By: Misty Stanley M.D.   On: 09/06/2016 16:46   Ct Abdomen Pelvis W Contrast  Result Date: 09/06/2016 CLINICAL DATA:  Restaging right lung cancer with bone metastases. EXAM: CT CHEST, ABDOMEN, AND PELVIS WITH CONTRAST TECHNIQUE: Multidetector CT imaging of the chest, abdomen and pelvis was performed following the standard protocol during bolus administration of intravenous contrast. CONTRAST:  147m ISOVUE-300 IOPAMIDOL (ISOVUE-300) INJECTION 61% COMPARISON:  CTA chest 08/25/2016. Chest abdomen and pelvis CT from 07/12/2016. FINDINGS: RECIST 1.1 Target Lesions: 1. Masslike opacities/architectural distortion and medial aspect right upper lobe measures 6.5 x 3.5 cm today compared to 6.6 x 3.9 cm previously (image 17 series 2). Non-target Lesions: 1. 8 mm short axis AP window lymph node is unchanged 8 mm 2. 9 mm short axis subcarinal lymph node is stable at 9 mm 3. 11 mm short axis low right paratracheal lymph node is stable 11 mm (image 22 series 2). 4. The chronic small thick walled rim enhancing right pleural effusion is stable. 5. Note nodularity left  lower lobe. 6. Right iliac metastatic lesion is present. 7. Right glenoid metastatic involvement this scan visualized. CT CHEST FINDINGS Cardiovascular: Heart size normal. Coronary artery calcification is noted. No pericardial effusion Atherosclerotic calcification is noted in the wall of the thoracic aorta. Mediastinum/Nodes: Tiny bilateral thyroid nodules are stable and likely clinically insignificant. Mediastinal lymphadenopathy is unchanged. There is some probable mild circumferential wall thickening in the midesophagus, stable. Lungs/Pleura: Interstitial and airspace opacity associated with volume loss in the right hemi thorax is unchanged. There is some compressive atelectasis left lower lobe, new in the interval with new associated small left pleural effusion. If this at a.m. IIA is best appreciated in the left lung. Musculoskeletal: Lytic lesion involving the left posterior elements at T12 shows evidence of epidural tumor involvement, as before. Advanced lytic lesion in the T11 vertebral body is stable. Metastatic disease is seen elsewhere in the thoracic spine, right shoulder and right ribs. CT ABDOMEN PELVIS FINDINGS Hepatobiliary: Inferior liver has a nodular contour are, raising the question of cirrhosis. There is no evidence for gallstones, gallbladder wall thickening, or pericholecystic fluid. Mild distention extrahepatic common duct up to 13 mm compares to 12 mm previously. Pancreas: No focal mass lesion. No dilatation of the main duct. No intraparenchymal cyst. No peripancreatic edema. Spleen: No splenomegaly. No focal mass lesion. Adrenals/Urinary Tract: No adrenal nodule or mass. Tiny hypo attenuating lesions in the kidneys bilaterally are too small to characterize but likely represent cysts. No evidence for hydroureter. The urinary bladder appears normal for the degree of distention. Stomach/Bowel: Stomach is unremarkable. Duodenum is normally positioned as is the ligament of Treitz. No small  bowel wall thickening. No small bowel dilatation.  The terminal ileum is normal. The appendix is normal. No gross colonic mass. No colonic wall thickening. No substantial diverticular change. Vascular/Lymphatic: There is abdominal aortic atherosclerosis without aneurysm. There is no gastrohepatic or hepatoduodenal ligament lymphadenopathy. No intraperitoneal or retroperitoneal lymphadenopathy. No pelvic sidewall lymphadenopathy. Reproductive: Prostate gland is enlarged. Other: No intraperitoneal free fluid. Musculoskeletal: Lytic lesion left iliac crest is stable. Large destructive lesion right iliac bone is unchanged. IMPRESSION: 1. Stable appearance right upper lobe lesion . 2. Stable mediastinal lymphadenopathy. 3. Stable post treatment changes right lung. The interstitial and airspace disease towards the right lower lung is indeterminate etiology but stable. This may be related to treatment FX, but lymphangitic tumor remains a consideration. 4. Stable thick walled rim enhancing right pleural fluid collection with new free-flowing left pleural fluid on today's study. 5. Destructive bony metastatic disease in the chest, abdomen, and pelvis. 6. Changes in the liver morphology compatible with cirrhosis. 7. Coronary artery atherosclerosis. Electronically Signed   By: Misty Stanley M.D.   On: 09/06/2016 16:46    ASSESSMENT/PLAN:    Pleural effusion Patient was recently diagnosed with a left pleural effusion.  He underwent a left thoracentesis earlier this week which removed approximately 800 mL of pleural fluid.  Patient stated that he felt better after the thoracentesis; but yesterday morning he once again felt short of breath and weak.  He states that the shortness of breath completely resolved by yesterday afternoon; he has no new symptoms today.  He denies any worsening shortness of breath, productive cough, or increased oxygen need.  He denies any recent fevers or chills.  He also denies any chest pain,  chest pressure, or pain with inspiration.  Exam today reveals lungs essentially clear with diminished basis only.  O2 sat was 96% on 3 L O2 via nasal cannula.  Patient states that he only uses his oxygen on an as-needed basis.  At one point during the exam.  He took the oxygen completely off and remained stable with his breathing.  Confirm the patient continues to take his Eliquis as directed.  Chest x-ray obtained today revealed no acute findings.  Labs obtained today were all essentially within normal limits   Patient was advised to call/return if he develops any worsening symptoms; and he should go directly to the emergency department over the weekend if he has any urgent needs.  Cancer of upper lobe of right lung [facility]) Patient continues to undergo the "Checkmated 370" clinical trial of nivolumab.  He is scheduled to return on 09/13/2016 for labs, flush, visit, and his next cycle of immunotherapy.  Also, patient underwent a restaging CT of his chest, 6 abdomen, and pelvis earlier this week as well.  Briefly reviewed all CT results with Dr. Benay Spice; and then reviewed all CT results once again with both patient and his wife.  Confirmed to both the patient and his wife that the patient's CT results were essentially stable.  Also, confirmed that the pleural fluid removed via thoracentesis of the left lung earlier this week did reveal that it was malignant.  Neck abscess Patient stated that he was admitted to the hospital last week; and was noted per the hospitalist to have a very large anterior neck abscess.  Patient states that the abscesses been there for approximately 35-40 years.  The hospitalist opened and drained the abscess while patient was admitted.  Patient states that the site continues to do some purulent drainage; but that it is much smaller than it was originally.  He continues to take Keflex antibiotics.  It was initially given to him by the hospitalist.  Exam today reveals an  approximately 2 cm in diameter abscess to the anterior neck that is slightly draining some purulent drainage.  Was able to remove a small amount of purulent fluid from the site with manual expulsion.  Wound culture was obtained of fluid.  Was not able to completely remove all loculations to the neck abscess; since patient continues with Eliquis anticoagulation.  Patient was advised to use warm, moist compresses to the site.  Several times per day.  Patient was also advised to call/return or go directed to the emergency department for any worsening symptoms whatsoever.  Long term current use of anticoagulant therapy Patient continues to take the clinic with anticoagulation as directed  For recently diagnosed right leg DVT and pulmonary embolism.   Patient stated understanding of all instructions; and was in agreement with this plan of care. The patient knows to call the clinic with any problems, questions or concerns.   Total time spent with patient was 40 minutes;  with greater than 75 percent of that time spent in face to face counseling regarding patient's symptoms,  and coordination of care and follow up.  Disclaimer:This dictation was prepared with Dragon/digital dictation along with [employer]. Any transcriptional errors that result from this process are unintentional.  Drue Second, NP 09/08/2016

## 2016-09-08 NOTE — Assessment & Plan Note (Signed)
Patient was recently diagnosed with a left pleural effusion.  He underwent a left thoracentesis earlier this week which removed approximately 800 mL of pleural fluid.  Patient stated that he felt better after the thoracentesis; but yesterday morning he once again felt short of breath and weak.  He states that the shortness of breath completely resolved by yesterday afternoon; he has no new symptoms today.  He denies any worsening shortness of breath, productive cough, or increased oxygen need.  He denies any recent fevers or chills.  He also denies any chest pain, chest pressure, or pain with inspiration.  Exam today reveals lungs essentially clear with diminished basis only.  O2 sat was 96% on 3 L O2 via nasal cannula.  Patient states that he only uses his oxygen on an as-needed basis.  At one point during the exam.  He took the oxygen completely off and remained stable with his breathing.  Confirm the patient continues to take his Eliquis as directed.  Chest x-ray obtained today revealed no acute findings.  Labs obtained today were all essentially within normal limits   Patient was advised to call/return if he develops any worsening symptoms; and he should go directly to the emergency department over the weekend if he has any urgent needs.

## 2016-09-08 NOTE — Assessment & Plan Note (Signed)
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

## 2016-09-11 LAB — WOUND CULTURE

## 2016-09-12 ENCOUNTER — Telehealth: Payer: Self-pay

## 2016-09-12 NOTE — Telephone Encounter (Signed)
S/w wife. She says he is breathing better and the abcess on his neck looks a lot better. They will come for tx tomorrow.

## 2016-09-13 ENCOUNTER — Ambulatory Visit: Payer: Medicare Other

## 2016-09-13 ENCOUNTER — Ambulatory Visit (HOSPITAL_BASED_OUTPATIENT_CLINIC_OR_DEPARTMENT_OTHER): Payer: Medicare Other | Admitting: Internal Medicine

## 2016-09-13 ENCOUNTER — Other Ambulatory Visit: Payer: Medicare Other

## 2016-09-13 ENCOUNTER — Encounter: Payer: Self-pay | Admitting: Internal Medicine

## 2016-09-13 ENCOUNTER — Other Ambulatory Visit: Payer: Self-pay | Admitting: Medical Oncology

## 2016-09-13 ENCOUNTER — Encounter: Payer: Self-pay | Admitting: Medical Oncology

## 2016-09-13 ENCOUNTER — Other Ambulatory Visit (HOSPITAL_BASED_OUTPATIENT_CLINIC_OR_DEPARTMENT_OTHER): Payer: Medicare Other

## 2016-09-13 ENCOUNTER — Telehealth: Payer: Self-pay | Admitting: *Deleted

## 2016-09-13 ENCOUNTER — Telehealth: Payer: Self-pay | Admitting: Internal Medicine

## 2016-09-13 ENCOUNTER — Other Ambulatory Visit (HOSPITAL_COMMUNITY)
Admission: AD | Admit: 2016-09-13 | Discharge: 2016-09-13 | Disposition: A | Discharge status: Home / Self Care | Payer: Medicare Other | Source: Ambulatory Visit | Attending: Internal Medicine | Admitting: Internal Medicine

## 2016-09-13 VITALS — BP 127/64 | HR 97 | Temp 98.6°F | Resp 17 | Ht 66.0 in | Wt 135.1 lb

## 2016-09-13 DIAGNOSIS — F329 Major depressive disorder, single episode, unspecified: Secondary | ICD-10-CM | POA: Diagnosis not present

## 2016-09-13 DIAGNOSIS — C3411 Malignant neoplasm of upper lobe, right bronchus or lung: Secondary | ICD-10-CM

## 2016-09-13 DIAGNOSIS — Z79899 Other long term (current) drug therapy: Secondary | ICD-10-CM

## 2016-09-13 DIAGNOSIS — I2699 Other pulmonary embolism without acute cor pulmonale: Secondary | ICD-10-CM

## 2016-09-13 DIAGNOSIS — R630 Anorexia: Secondary | ICD-10-CM | POA: Diagnosis not present

## 2016-09-13 DIAGNOSIS — Z006 Encounter for examination for normal comparison and control in clinical research program: Secondary | ICD-10-CM

## 2016-09-13 DIAGNOSIS — C7951 Secondary malignant neoplasm of bone: Secondary | ICD-10-CM | POA: Diagnosis not present

## 2016-09-13 LAB — CBC WITH DIFFERENTIAL/PLATELET
BASO%: 0.4 % (ref 0.0–2.0)
BASOS ABS: 0 10*3/uL (ref 0.0–0.1)
EOS ABS: 0.4 10*3/uL (ref 0.0–0.5)
EOS%: 4.1 % (ref 0.0–7.0)
HCT: 43.8 % (ref 38.4–49.9)
HGB: 14.5 g/dL (ref 13.0–17.1)
LYMPH%: 17.8 % (ref 14.0–49.0)
MCH: 32.7 pg (ref 27.2–33.4)
MCHC: 33.1 g/dL (ref 32.0–36.0)
MCV: 98.8 fL — AB (ref 79.3–98.0)
MONO#: 0.8 10*3/uL (ref 0.1–0.9)
MONO%: 9.2 % (ref 0.0–14.0)
NEUT#: 5.9 10*3/uL (ref 1.5–6.5)
NEUT%: 68.5 % (ref 39.0–75.0)
PLATELETS: 250 10*3/uL (ref 140–400)
RBC: 4.43 10*6/uL (ref 4.20–5.82)
RDW: 13.2 % (ref 11.0–14.6)
WBC: 8.7 10*3/uL (ref 4.0–10.3)
lymph#: 1.5 10*3/uL (ref 0.9–3.3)

## 2016-09-13 LAB — PHOSPHORUS: Phosphorus: 3.7 mg/dL (ref 2.5–4.6)

## 2016-09-13 LAB — COMPREHENSIVE METABOLIC PANEL
ALT: 14 U/L (ref 0–55)
ANION GAP: 11 meq/L (ref 3–11)
AST: 20 U/L (ref 5–34)
Albumin: 2.5 g/dL — ABNORMAL LOW (ref 3.5–5.0)
Alkaline Phosphatase: 169 U/L — ABNORMAL HIGH (ref 40–150)
BILIRUBIN TOTAL: 0.44 mg/dL (ref 0.20–1.20)
BUN: 12.3 mg/dL (ref 7.0–26.0)
CHLORIDE: 104 meq/L (ref 98–109)
CO2: 26 meq/L (ref 22–29)
Calcium: 9.2 mg/dL (ref 8.4–10.4)
Creatinine: 0.9 mg/dL (ref 0.7–1.3)
EGFR: 82 mL/min/{1.73_m2} — AB (ref >90)
Glucose: 103 mg/dl (ref 70–140)
POTASSIUM: 3.8 meq/L (ref 3.5–5.1)
Sodium: 141 mEq/L (ref 136–145)
TOTAL PROTEIN: 6.7 g/dL (ref 6.4–8.3)

## 2016-09-13 LAB — MAGNESIUM: Magnesium: 2.1 mg/dl (ref 1.5–2.5)

## 2016-09-13 LAB — LACTATE DEHYDROGENASE: LDH: 222 U/L (ref 125–245)

## 2016-09-13 LAB — RESEARCH LABS

## 2016-09-13 LAB — TSH: TSH: 5.466 m(IU)/L — ABNORMAL HIGH (ref 0.320–4.118)

## 2016-09-13 MED ORDER — DEXAMETHASONE 4 MG PO TABS: 4.0000 mg | ORAL_TABLET | Freq: Three times a day (TID) | ORAL | 0 refills | Status: DC

## 2016-09-13 NOTE — Telephone Encounter (Signed)
Message sent to chemo scheduler to cancel all previously scheduled infusions, per MD 09/13/16 los. All previously scheduled labs and flush appointments cancelled, per Md, per 09/13/16 los. Avs report and appointment schedule given to patient, per 09/13/16 los.

## 2016-09-13 NOTE — Progress Notes (Signed)
[facility]MS 370: Group A, Arm B3: EOT visit Patient in today for labs and MD visit. Patient had restaging CT scan on 09/06/2016 and here for MD review of scan. Due to patient's progression, ePIP submitted to [facility] 370 study for review how to proceed,  because of progression, per medical monitor Dr. Charlyne Quale, patient to proceed to follow-up and off study. Today's appointment to be the End of Treatment visit. Patient received last Nivolumab treatment was on 08/30/2016. MD discussed with patient and spouse future treatment considerations. 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 EOT labs collected today and PRO's completed by patient with spouse's assistance of reading questions to patient and patient answering. V/S and O2 saturation completed. Patient reports appetite "better than it was" and today with a 3 pound weight gain since five days ago (last weighed). Patient reports no new concomitant medications since last visit. Reviewed A/E's with patient and patient reports to have continuing pain to right shoulder, which he received palliative radiation for in the past, patient states that pain has been on-going for approximately one month now, rates pain at worse a 7 on scale of 10 (being the worst). Patient confirms to have continued fatigued and SOB with exertion. Patient does have home oxygen and states he only uses it as needed, but does report that his breathing has improved. Today, at clinic with oxygen on 3L nasal canula 100% saturation at rest, and at one point during visit patient had removed his oxygen stating he didn't need it and kept it off during his entire clinic visit. Patient with no edema present to extremities, denies any nausea or vomiting, denies diarrhea, denies any neuro or skin issues, denies fever, dizziness or headaches. Patient confirms no left flank/chest pain today and states that pain to this site is hardly there anymore. I discussed with patient and spouse  follow up procedures as related to study and they confirm understanding that patient will no longer be receiving Nivolumab on study and if patient agrees to continue with being followed by study for survival then we will continue with follow-up visits. Both patient and spouse expressed understanding and agreement to continue with study in follow-up.   Patient and spouse were informed today that there is a new study consent form with updates. I reviewed all the changes to consent Version 4, dated 09/04/16. Patient, spouse and I went over all the new changes in the consent and I answered all their questions, as they came up, to their satisfaction, we did not review the changes to section 8 (A and B) due to reproduction issues do not apply to patient or spouse. Spouse, with the patient's request, initialed and dated each page for patient, due to patient's inability to see the small lines and his unsteady hands, and patient signed where signature of participant was required. A copy of the signed ICF was provided to patient for his records. Patient and spouse thanked for their continued support of study and were encouraged to contact Dr. Julien Nordmann or myself with any questions or concerns they may have.  Patient is scheduled to return 10/18 for follow-up visit 1. EOT PRO's completed at this visit. Research and EOT labs collected at this visit. Adele Dan, RN, BSN Clinical Research 09/13/2016 2:51 PM

## 2016-09-13 NOTE — Progress Notes (Signed)
[place] [phone]   Fax:([phone]) [place], MD [address] [place] [address]  DIAGNOSIS: Stage IV (T3, N3, M1b) non-small cell lung cancer, adenocarcinoma diagnosed in January 2016 presented with right upper lobe lung mass in addition to mediastinal lymphadenopathy and large right pleural effusion as well as bone metastasis.  PRIOR THERAPY:  1. Status post Pleurx catheter placement by Dr. Servando Snare for drainage of the right pleural effusion.  2. Systemic chemotherapy with carboplatin for AUC of 5 and Alimta 500 MG/M2 every 3 weeks. Status post 6 cycles. 3. Status post palliative radiotherapy to the metastatic bone lesion in the right glenoid as well as the right ilium area completed 02/18/2016.  4. Treatment according to the BMS checkmate 370 clinical trial and has been randomized to group A arm B-3 to receive Nivolumab 240 mg given every 2 weeks. First cycle expected 06/10/2015. Status post 32 cycles. Last cycle was given 08/30/2016 discontinued secondary to disease progression.  CURRENT THERAPY: None.  INTERVAL HISTORY: Omar Peters 81 y.o. male returns to the clinic today for follow-up visit accompanied by his wife. The patient is doing fine today except for fatigue and weakness. He was seen recently at the emergency department complaining of shortness of breath and was found to have new or enlarging left pleural effusion. He underwent ultrasound-guided thoracentesis on 09/04/2016 with drainage of 8 50 mL of left pleural effusion. The final cytology showed the presence of malignant cells. He also had repeat CT scan of the chest, abdomen and pelvis performed on 09/06/2016 and he is here for evaluation and discussion of his scan results. He is a little bit better after drainage of the left pleural effusion with less dyspnea. He denied having any significant chest pain but continues to have pain on the middle of the  back as well as right shoulder. He has no cough or hemoptysis. He denied having any significant weight loss or night sweats. He has no nausea or vomiting. He is here today for evaluation and discussion of his treatment options.  MEDICAL HISTORY: Past Medical History:  Diagnosis Date  . Anxiety    due to diagnosis of Stage 4 Lung Cancer  . Bone cancer ([facility])    non small cell lung with mets to bone  . Diabetes mellitus without complication ([facility])   . GERD (gastroesophageal reflux disease)   . Hypertension   . Lung cancer ([facility])    stage IV non small cell lung cancer dx. January 2016   . Macular degeneration of right eye   . Shortness of breath dyspnea    01/14/15 at night and has to sit up    ALLERGIES:  is allergic to marinol [dronabinol].  MEDICATIONS:  Current Outpatient Prescriptions  Medication Sig Dispense Refill  . amitriptyline (ELAVIL) 10 MG tablet Take 1 tablet (10 mg total) by mouth at bedtime. 30 tablet 0  . amLODipine (NORVASC) 10 MG tablet Take 10 mg by mouth daily.    Marland Kitchen apixaban (ELIQUIS) 5 MG TABS tablet Take 2 tablets (10 mg total) by mouth 2 (two) times daily. 74 tablet 0  . B Complex-Biotin-FA (B-COMPLEX PO) Take 1 each by mouth daily. Reported on 03/01/2016    . cephALEXin (KEFLEX) 500 MG capsule Take 1 capsule (500 mg total) by mouth every 6 (six) hours. 14 capsule 0  . feeding supplement, ENSURE ENLIVE, (ENSURE ENLIVE) LIQD Take 237 mLs by mouth 2 (two)  times daily between meals. 237 mL 12  . HYDROcodone-acetaminophen (NORCO) 5-325 MG per tablet Take 1 tablet by mouth every 6 (six) hours as needed for moderate pain. 30 tablet 0  . lidocaine-prilocaine (EMLA) cream Apply 1 application topically as needed. Pt is to apply cream 1 hour before receiving chemo 30 g 0  . magnesium hydroxide (MILK OF MAGNESIA) 400 MG/5ML suspension Take 30 mLs by mouth daily as needed for mild constipation.    . metFORMIN (GLUCOPHAGE) 500 MG tablet Take 500 mg by mouth daily.    .  mirtazapine (REMERON) 30 MG tablet Take 1 tablet by mouth at  bedtime 30 tablet 2  . PRESCRIPTION MEDICATION Place 1 each into the right eye every 8 (eight) weeks. Eye injection.    . prochlorperazine (COMPAZINE) 10 MG tablet Take 1 tablet (10 mg total) by mouth every 6 (six) hours as needed for nausea or vomiting. 30 tablet 0  . ranitidine (ZANTAC) 150 MG tablet Take 150 mg by mouth daily.    Marland Kitchen senna-docusate (SENNA S) 8.6-50 MG per tablet Take 2 tablets by mouth daily.    . traMADol (ULTRAM) 50 MG tablet Take 1 tablet (50 mg total) by mouth every 6 (six) hours as needed. (Patient taking differently: Take 50 mg by mouth every 6 (six) hours as needed for moderate pain. ) 60 tablet 0   No current facility-administered medications for this visit.     SURGICAL HISTORY:  Past Surgical History:  Procedure Laterality Date  . CHEST TUBE INSERTION Right 01/15/2015   Procedure: INSERTION PLEURAL DRAINAGE CATHETER WITH ULTRASOUND AND FLURO GUIDANCE;  Surgeon: Grace Isaac, MD;  Location: [facility];  Service: Thoracic;  Laterality: Right;  . REMOVAL OF PLEURAL DRAINAGE CATHETER Right 03/26/2015   Procedure: REMOVAL OF PLEURAL DRAINAGE CATHETER;  Surgeon: Grace Isaac, MD;  Location: [facility];  Service: Thoracic;  Laterality: Right;  . VEIN LIGATION AND STRIPPING  1977    REVIEW OF SYSTEMS:  Constitutional: positive for fatigue Eyes: negative Ears, nose, mouth, throat, and face: negative Respiratory: positive for dyspnea on exertion Cardiovascular: negative Gastrointestinal: negative Genitourinary:negative Integument/breast: negative Hematologic/lymphatic: negative Musculoskeletal:positive for muscle weakness Neurological: negative Behavioral/Psych: negative Endocrine: negative Allergic/Immunologic: negative   PHYSICAL EXAMINATION: General appearance: alert, cooperative and no distress Head: Normocephalic, without obvious abnormality, atraumatic Neck: no adenopathy, no JVD, supple,  symmetrical, trachea midline and thyroid not enlarged, symmetric, no tenderness/mass/nodules Lymph nodes: Cervical, supraclavicular, and axillary nodes normal. Resp: clear to auscultation bilaterally Back: symmetric, no curvature. ROM normal. No CVA tenderness. Cardio: regular rate and rhythm, S1, S2 normal, no murmur, click, rub or gallop GI: soft, non-tender; bowel sounds normal; no masses,  no organomegaly Extremities: extremities normal, atraumatic, no cyanosis or edema Neurologic: Alert and oriented X 3, normal strength and tone. Normal symmetric reflexes. Normal coordination and gait  ECOG PERFORMANCE STATUS: 1 - Symptomatic but completely ambulatory  Blood pressure 127/64, pulse 97, temperature 98.6 F (37 C), temperature source Oral, resp. rate 17, height '5\' 6"'$  (1.676 m), weight 135 lb 1.6 oz (61.3 kg), SpO2 100 %.  LABORATORY DATA: Lab Results  Component Value Date   WBC 8.7 09/13/2016   HGB 14.5 09/13/2016   HCT 43.8 09/13/2016   MCV 98.8 (H) 09/13/2016   PLT 250 09/13/2016      Chemistry      Component Value Date/Time   NA 141 09/08/2016 0914   K 3.6 09/08/2016 0914   CL 105 08/26/2016 0500   CO2 29 09/08/2016  0914   BUN 11.7 09/08/2016 0914   CREATININE 0.8 09/08/2016 0914   GLU 231 (H) 03/10/2015 1539      Component Value Date/Time   CALCIUM 9.8 09/08/2016 0914   ALKPHOS 169 (H) 09/08/2016 0914   AST 20 09/08/2016 0914   ALT 16 09/08/2016 0914   BILITOT 0.38 09/08/2016 0914       RADIOGRAPHIC STUDIES: Dg Chest 1 View  Result Date: 09/08/2016 CLINICAL DATA:  Shortness of Breath.  Thoracentesis on Monday. EXAM: CHEST 1 VIEW COMPARISON:  CT 09/06/2016 FINDINGS: Right Port-A-Cath is in place with the tip at the cavoatrial junction. Small bilateral effusions. Diffuse airspace disease throughout the right lung. Heart is normal size. No real change since prior study. IMPRESSION: Diffuse airspace disease on the right with small bilateral effusions. No change since  prior study. Electronically Signed   By: [name]   On: 09/08/2016 09:02   Dg Chest 1 View  Result Date: 09/04/2016 CLINICAL DATA:  Status post thoracentesis. EXAM: CHEST 1 VIEW COMPARISON:  08/25/2016 FINDINGS: Radiographically resolved left pleural effusion without re-expansion edema or pneumothorax. Stable extensive pleural and parenchymal opacification on the right with recent chest CT available. Normal heart size. Porta catheter on the right with tip in stable position. IMPRESSION: No acute finding after left thoracentesis. No visible remaining left pleural fluid. Electronically Signed   By: [name]   On: 09/04/2016 15:25   Dg Chest 2 View  Result Date: 08/25/2016 CLINICAL DATA:  Productive cough, shortness of breath. EXAM: CHEST  2 VIEW COMPARISON:  Radiographs of April 12, 2015. CT scan of July 12, 2016. FINDINGS: Stable cardiomediastinal silhouette. Interval placement of right internal jugular Port-A-Cath with distal tip in expected position of cavoatrial junction. No pneumothorax is noted. Mild left basilar subsegmental atelectasis is noted. Stable mild loculated right pleural effusion is noted. Right basilar opacity is noted concerning for atelectasis, edema, inflammation or possibly scarring. Bony thorax is unremarkable. IMPRESSION: Mild left basilar subsegmental atelectasis. Interval placement of right internal jugular Port-A-Cath. Stable mild loculated right pleural effusion is noted. Probable stable right basilar opacity is noted as described above. Electronically Signed   By: Marijo Conception, M.D.   On: 08/25/2016 11:35   Ct Chest W Contrast  Result Date: 09/06/2016 CLINICAL DATA:  Restaging right lung cancer with bone metastases. EXAM: CT CHEST, ABDOMEN, AND PELVIS WITH CONTRAST TECHNIQUE: Multidetector CT imaging of the chest, abdomen and pelvis was performed following the standard protocol during bolus administration of intravenous contrast. CONTRAST:  120m  ISOVUE-300 IOPAMIDOL (ISOVUE-300) INJECTION 61% COMPARISON:  CTA chest 08/25/2016. Chest abdomen and pelvis CT from 07/12/2016. FINDINGS: RECIST 1.1 Target Lesions: 1. Masslike opacities/architectural distortion and medial aspect right upper lobe measures 6.5 x 3.5 cm today compared to 6.6 x 3.9 cm previously (image 17 series 2). Non-target Lesions: 1. 8 mm short axis AP window lymph node is unchanged 8 mm 2. 9 mm short axis subcarinal lymph node is stable at 9 mm 3. 11 mm short axis low right paratracheal lymph node is stable 11 mm (image 22 series 2). 4. The chronic small thick walled rim enhancing right pleural effusion is stable. 5. Note nodularity left lower lobe. 6. Right iliac metastatic lesion is present. 7. Right glenoid metastatic involvement this scan visualized. CT CHEST FINDINGS Cardiovascular: Heart size normal. Coronary artery calcification is noted. No pericardial effusion Atherosclerotic calcification is noted in the wall of the thoracic aorta. Mediastinum/Nodes: Tiny bilateral thyroid nodules are stable and  likely clinically insignificant. Mediastinal lymphadenopathy is unchanged. There is some probable mild circumferential wall thickening in the midesophagus, stable. Lungs/Pleura: Interstitial and airspace opacity associated with volume loss in the right hemi thorax is unchanged. There is some compressive atelectasis left lower lobe, new in the interval with new associated small left pleural effusion. If this at a.m. IIA is best appreciated in the left lung. Musculoskeletal: Lytic lesion involving the left posterior elements at T12 shows evidence of epidural tumor involvement, as before. Advanced lytic lesion in the T11 vertebral body is stable. Metastatic disease is seen elsewhere in the thoracic spine, right shoulder and right ribs. CT ABDOMEN PELVIS FINDINGS Hepatobiliary: Inferior liver has a nodular contour are, raising the question of cirrhosis. There is no evidence for gallstones,  gallbladder wall thickening, or pericholecystic fluid. Mild distention extrahepatic common duct up to 13 mm compares to 12 mm previously. Pancreas: No focal mass lesion. No dilatation of the main duct. No intraparenchymal cyst. No peripancreatic edema. Spleen: No splenomegaly. No focal mass lesion. Adrenals/Urinary Tract: No adrenal nodule or mass. Tiny hypo attenuating lesions in the kidneys bilaterally are too small to characterize but likely represent cysts. No evidence for hydroureter. The urinary bladder appears normal for the degree of distention. Stomach/Bowel: Stomach is unremarkable. Duodenum is normally positioned as is the ligament of Treitz. No small bowel wall thickening. No small bowel dilatation. The terminal ileum is normal. The appendix is normal. No gross colonic mass. No colonic wall thickening. No substantial diverticular change. Vascular/Lymphatic: There is abdominal aortic atherosclerosis without aneurysm. There is no gastrohepatic or hepatoduodenal ligament lymphadenopathy. No intraperitoneal or retroperitoneal lymphadenopathy. No pelvic sidewall lymphadenopathy. Reproductive: Prostate gland is enlarged. Other: No intraperitoneal free fluid. Musculoskeletal: Lytic lesion left iliac crest is stable. Large destructive lesion right iliac bone is unchanged. IMPRESSION: 1. Stable appearance right upper lobe lesion . 2. Stable mediastinal lymphadenopathy. 3. Stable post treatment changes right lung. The interstitial and airspace disease towards the right lower lung is indeterminate etiology but stable. This may be related to treatment FX, but lymphangitic tumor remains a consideration. 4. Stable thick walled rim enhancing right pleural fluid collection with new free-flowing left pleural fluid on today's study. 5. Destructive bony metastatic disease in the chest, abdomen, and pelvis. 6. Changes in the liver morphology compatible with cirrhosis. 7. Coronary artery atherosclerosis. Electronically  Signed   By: Misty Stanley M.D.   On: 09/06/2016 16:46   Ct Angio Chest Pe W And/or Wo Contrast  Result Date: 08/25/2016 CLINICAL DATA:  Metastatic lung cancer.  Shortness of breath. EXAM: CT ANGIOGRAPHY CHEST WITH CONTRAST TECHNIQUE: Multidetector CT imaging of the chest was performed using the standard protocol during bolus administration of intravenous contrast. Multiplanar CT image reconstructions and MIPs were obtained to evaluate the vascular anatomy. CONTRAST:  57 cc of Isovue 370 COMPARISON:  07/12/2016 RECIST 1.1 Target Lesions: 1. Mass-like area of architectural distortion in the medial aspect of the right upper lobe measures 6.6 x 3.3 cm (image 27 of series 4) previously 6.6 x 3.9 cm Non-target Lesions: 1.  7 mm short axis AP window lymph node (image 34of series 4), stable. 2. 8 mm short axis subcarinal lymph node (image 40 of series 4), previously 9 mm. 3. 11 mm short axis low right paratracheal lymph node (image 35 of series 4), stable. 5. Small chronic thick-walled rim enhancing right pleural effusion, Slightly increased in volume from previous exam, image 60 of series 4. 6. Left lower lobe pulmonary nodularity not visualized. 7.  Right glenoid metastasis, present, image 18 is series 4 FINDINGS: Mediastinum: Normal heart size. No pericardial effusion. Aortic atherosclerosis noted. Calcification within the LAD and RCA coronary artery noted. The trachea appears patent and midline. Mild diffuse wall thickening involving the esophagus. The main pulmonary artery is patent. There is abnormal filling defect within the left dx lower lobe pulmonary artery, image 120 of series 10. Lungs/Pleura: New moderate left pleural effusion with overlying compressive type atelectasis and consolidation. There has been slight increase in volume of loculated effusion overlying the posterior right lower lobe. There is progressive soft tissue consolidation and interstitial thickening involving the right mid and lower lung  zone. Similar appearance of masslike consolidation overlying the right lung apex. Upper Abdomen: No focal liver abnormality. The liver has a nodular contour or and there is hypertrophy of the caudate lobe and lateral segment of left lobe. No adrenal abnormality identified. The visualized portions of the pancreas and spleen are normal. Musculoskeletal: Her mid to metastasis involving the right glenoid and scapula is again identified. Subjectively, there appears to be progression of this lesion with further loss of bone. Expansile lytic lesion involving the lateral aspect of the right 6 rib is identified with surrounding soft tissue component measures 3.4 by 2.3 cm, image 56 of series 4. On the previous exam this measured 2.1 x 3.4 cm. Lytic lesion involving the pedicle of T11 measures 3.7 cm, image 89 of series 4. Previously 3.5 cm. a left-sided canal involvement is suspected. Cannot rule out spinal stenosis at this level. Review of the MIP images confirms the above findings. IMPRESSION: 1. Examination is positive for acute pulmonary embolus to the left lower lobe. 2. New moderate volume left pleural effusion and slight increase in volume of loculated right effusion. 3. Worsening aeration to the right lung. 4. Mild progression of multi focal lytic bone metastasis 5. Aortic atherosclerosis and coronary artery disease. Critical Value/emergent results were called by telephone at the time of interpretation on 08/25/2016 at 2:12 pm to Dr. Virgel Manifold , who verbally acknowledged these results. Electronically Signed   By: Kerby Moors M.D.   On: 08/25/2016 14:12   Ct Abdomen Pelvis W Contrast  Result Date: 09/06/2016 CLINICAL DATA:  Restaging right lung cancer with bone metastases. EXAM: CT CHEST, ABDOMEN, AND PELVIS WITH CONTRAST TECHNIQUE: Multidetector CT imaging of the chest, abdomen and pelvis was performed following the standard protocol during bolus administration of intravenous contrast. CONTRAST:  168m  ISOVUE-300 IOPAMIDOL (ISOVUE-300) INJECTION 61% COMPARISON:  CTA chest 08/25/2016. Chest abdomen and pelvis CT from 07/12/2016. FINDINGS: RECIST 1.1 Target Lesions: 1. Masslike opacities/architectural distortion and medial aspect right upper lobe measures 6.5 x 3.5 cm today compared to 6.6 x 3.9 cm previously (image 17 series 2). Non-target Lesions: 1. 8 mm short axis AP window lymph node is unchanged 8 mm 2. 9 mm short axis subcarinal lymph node is stable at 9 mm 3. 11 mm short axis low right paratracheal lymph node is stable 11 mm (image 22 series 2). 4. The chronic small thick walled rim enhancing right pleural effusion is stable. 5. Note nodularity left lower lobe. 6. Right iliac metastatic lesion is present. 7. Right glenoid metastatic involvement this scan visualized. CT CHEST FINDINGS Cardiovascular: Heart size normal. Coronary artery calcification is noted. No pericardial effusion Atherosclerotic calcification is noted in the wall of the thoracic aorta. Mediastinum/Nodes: Tiny bilateral thyroid nodules are stable and likely clinically insignificant. Mediastinal lymphadenopathy is unchanged. There is some probable mild circumferential wall thickening in  the midesophagus, stable. Lungs/Pleura: Interstitial and airspace opacity associated with volume loss in the right hemi thorax is unchanged. There is some compressive atelectasis left lower lobe, new in the interval with new associated small left pleural effusion. If this at a.m. IIA is best appreciated in the left lung. Musculoskeletal: Lytic lesion involving the left posterior elements at T12 shows evidence of epidural tumor involvement, as before. Advanced lytic lesion in the T11 vertebral body is stable. Metastatic disease is seen elsewhere in the thoracic spine, right shoulder and right ribs. CT ABDOMEN PELVIS FINDINGS Hepatobiliary: Inferior liver has a nodular contour are, raising the question of cirrhosis. There is no evidence for gallstones,  gallbladder wall thickening, or pericholecystic fluid. Mild distention extrahepatic common duct up to 13 mm compares to 12 mm previously. Pancreas: No focal mass lesion. No dilatation of the main duct. No intraparenchymal cyst. No peripancreatic edema. Spleen: No splenomegaly. No focal mass lesion. Adrenals/Urinary Tract: No adrenal nodule or mass. Tiny hypo attenuating lesions in the kidneys bilaterally are too small to characterize but likely represent cysts. No evidence for hydroureter. The urinary bladder appears normal for the degree of distention. Stomach/Bowel: Stomach is unremarkable. Duodenum is normally positioned as is the ligament of Treitz. No small bowel wall thickening. No small bowel dilatation. The terminal ileum is normal. The appendix is normal. No gross colonic mass. No colonic wall thickening. No substantial diverticular change. Vascular/Lymphatic: There is abdominal aortic atherosclerosis without aneurysm. There is no gastrohepatic or hepatoduodenal ligament lymphadenopathy. No intraperitoneal or retroperitoneal lymphadenopathy. No pelvic sidewall lymphadenopathy. Reproductive: Prostate gland is enlarged. Other: No intraperitoneal free fluid. Musculoskeletal: Lytic lesion left iliac crest is stable. Large destructive lesion right iliac bone is unchanged. IMPRESSION: 1. Stable appearance right upper lobe lesion . 2. Stable mediastinal lymphadenopathy. 3. Stable post treatment changes right lung. The interstitial and airspace disease towards the right lower lung is indeterminate etiology but stable. This may be related to treatment FX, but lymphangitic tumor remains a consideration. 4. Stable thick walled rim enhancing right pleural fluid collection with new free-flowing left pleural fluid on today's study. 5. Destructive bony metastatic disease in the chest, abdomen, and pelvis. 6. Changes in the liver morphology compatible with cirrhosis. 7. Coronary artery atherosclerosis. Electronically  Signed   By: Misty Stanley M.D.   On: 09/06/2016 16:46   US Thoracentesis Asp Pleural Space W/img Guide  Result Date: 09/04/2016 INDICATION: Patient with history of lung cancer, left pleural effusion; request made for diagnostic and therapeutic left thoracentesis. EXAM: ULTRASOUND GUIDED DIAGNOSTIC AND THERAPEUTIC LEFT THORACENTESIS MEDICATIONS: None. COMPLICATIONS: None immediate. PROCEDURE: An ultrasound guided thoracentesis was thoroughly discussed with the patient and questions answered. The benefits, risks, alternatives and complications were also discussed. The patient understands and wishes to proceed with the procedure. Written consent was obtained. Ultrasound was performed to localize and mark an adequate pocket of fluid in the left chest. The area was then prepped and draped in the normal sterile fashion. 1% Lidocaine was used for local anesthesia. Under ultrasound guidance a Safe-T-Centesis catheter was introduced. Thoracentesis was performed. The catheter was removed and a dressing applied. FINDINGS: A total of approximately 850 cc of slightly hazy, yellow fluid was removed. Samples were sent to the laboratory as requested by the clinical team. IMPRESSION: Successful ultrasound guided diagnostic and therapeutic left thoracentesis yielding 850 cc of pleural fluid. Read by: Rowe Robert, PA-C Electronically Signed   By: Marybelle Killings M.D.   On: 09/04/2016 14:54    ASSESSMENT AND PLAN:  This is a very pleasant 81 years old white male with history of stage IV non-small cell lung cancer, adenocarcinoma status post induction chemotherapy was carboplatin and Alimta and currently undergoing treatment with immunotherapy according to the BMS checkmated 370 clinical trial. He was randomized to the Nivolumab arm 240 mg IV every 2 weeks. He is status post 32 cycles and tolerating his treatment fairly well.  I recommended for the patient to continue his treatment with Nivolumab and he will proceed with cycle #32  today as a scheduled.  Unfortunately the recent CT scan of the chest, abdomen and pelvis showed some evidence for disease progression with the new left malignant pleural effusion. He has a stable disease otherwise body scan showed T12 metastatic bone lesion with questionable epidural extension. I discussed the scan results with the patient and his wife. I recommended for him to discontinue treatment on the clinical trial with Nivolumab. He may be considered for the same treatment in the future on commercial basis off protocol or treatment with systemic chemotherapy with single agent Alimta or a combination of carboplatin and Alimta again. For the metastatic bone lesion, I would refer the patient to [name] for consideration of palliative radiotherapy to this lesion. I also started the patient on Decadron 4 mg by mouth 3 times a day until he starts the radiotherapy. For the newly diagnosed pulmonary embolism, the patient will continue his treatment with Eliquis. For depression and lack of appetite, he will continue on Remeron 30 mg by mouth daily at bedtime. For the left-sided chest pain, he will continue on tramadol 50 mg by mouth every 6 hours as needed for pain. He would come back for follow-up visit in 3 weeks for reevaluation and more detailed discussion of his treatment options. The patient was advised to call immediately if he has any concerning symptoms in the interval. The patient voices understanding of current disease status and treatment options and is in agreement with the current care plan.  All questions were answered. The patient knows to call the clinic with any problems, questions or concerns. We can certainly see the patient much sooner if necessary.  Disclaimer: This note was dictated with voice recognition software. Similar sounding words can inadvertently be transcribed and may not be corrected upon review.

## 2016-09-13 NOTE — Telephone Encounter (Signed)
Per LOS and scheduler I have cancelled all treamtent appts. Notified the scheduler

## 2016-09-13 NOTE — Progress Notes (Signed)
Adele Dan (nurse) asked me about copay assistance for pt's Eliquis.  After researching, [employer] may have assistance so I completed the application.  Pt had already left for the day so I called and spoke to his wife regarding getting pt's signature on the application.  She requested I mail his portion and she will mail it back to me.  Once received I will fax to [employer].

## 2016-09-14 LAB — T3, FREE: Triiodothyronine,Free,Serum: 2.5 pg/mL (ref 2.0–4.4)

## 2016-09-14 LAB — T4, FREE: T4,Free(Direct): 0.99 ng/dL (ref 0.82–1.77)

## 2016-09-15 ENCOUNTER — Other Ambulatory Visit: Payer: Self-pay | Admitting: Medical Oncology

## 2016-09-15 MED ORDER — DEXAMETHASONE 4 MG PO TABS: 4.0000 mg | ORAL_TABLET | Freq: Three times a day (TID) | ORAL | 0 refills | Status: AC

## 2016-09-18 ENCOUNTER — Ambulatory Visit
Admission: RE | Admit: 2016-09-18 | Discharge: 2016-09-18 | Disposition: A | Discharge status: Home / Self Care | Payer: Medicare Other | Source: Ambulatory Visit | Attending: Radiation Oncology | Admitting: Radiation Oncology

## 2016-09-18 ENCOUNTER — Ambulatory Visit
Admission: RE | Admit: 2016-09-18 | Discharge: 2016-09-18 | Disposition: A | Discharge status: Home / Self Care | Payer: Medicare Other | Source: Ambulatory Visit | Admitting: Radiation Oncology

## 2016-09-18 ENCOUNTER — Encounter: Payer: Self-pay | Admitting: Radiation Oncology

## 2016-09-18 ENCOUNTER — Encounter: Payer: Medicare Other | Admitting: Nurse Practitioner

## 2016-09-18 ENCOUNTER — Ambulatory Visit: Payer: Medicare Other

## 2016-09-18 ENCOUNTER — Ambulatory Visit (HOSPITAL_COMMUNITY)
Admission: RE | Admit: 2016-09-18 | Discharge: 2016-09-18 | Disposition: A | Discharge status: Home / Self Care | Payer: Medicare Other | Source: Ambulatory Visit | Attending: Radiation Oncology | Admitting: Radiation Oncology

## 2016-09-18 ENCOUNTER — Telehealth: Payer: Self-pay | Admitting: Medical Oncology

## 2016-09-18 VITALS — BP 146/79 | HR 91 | Resp 18 | Ht 66.0 in | Wt 135.0 lb

## 2016-09-18 DIAGNOSIS — G839 Paralytic syndrome, unspecified: Secondary | ICD-10-CM | POA: Insufficient documentation

## 2016-09-18 DIAGNOSIS — C3411 Malignant neoplasm of upper lobe, right bronchus or lung: Secondary | ICD-10-CM | POA: Insufficient documentation

## 2016-09-18 DIAGNOSIS — C7951 Secondary malignant neoplasm of bone: Secondary | ICD-10-CM | POA: Insufficient documentation

## 2016-09-18 DIAGNOSIS — Z7984 Long term (current) use of oral hypoglycemic drugs: Secondary | ICD-10-CM | POA: Insufficient documentation

## 2016-09-18 DIAGNOSIS — Z510 Encounter for antineoplastic radiation therapy: Secondary | ICD-10-CM | POA: Insufficient documentation

## 2016-09-18 DIAGNOSIS — C7952 Secondary malignant neoplasm of bone marrow: Secondary | ICD-10-CM

## 2016-09-18 DIAGNOSIS — D334 Benign neoplasm of spinal cord: Secondary | ICD-10-CM

## 2016-09-18 DIAGNOSIS — Z79899 Other long term (current) drug therapy: Secondary | ICD-10-CM | POA: Insufficient documentation

## 2016-09-18 DIAGNOSIS — J90 Pleural effusion, not elsewhere classified: Secondary | ICD-10-CM

## 2016-09-18 DIAGNOSIS — R410 Disorientation, unspecified: Secondary | ICD-10-CM | POA: Diagnosis not present

## 2016-09-18 DIAGNOSIS — Z7901 Long term (current) use of anticoagulants: Secondary | ICD-10-CM | POA: Insufficient documentation

## 2016-09-18 MED ORDER — GADOBENATE DIMEGLUMINE 529 MG/ML IV SOLN
13.0000 mL | Freq: Once | INTRAVENOUS | Status: AC | PRN
  Administered 2016-09-18: 13 mL via INTRAVENOUS

## 2016-09-18 NOTE — Progress Notes (Signed)
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 Bone metastasis ([facility]) 198.5 C79.51   2. Paresis ([facility]) 344.9 G83.9 MR Thoracic Spine W Contrast  3. Cancer of upper lobe of right lung ([facility]) 162.3 C34.11 MR Thoracic Spine W Contrast    Interval Since Last Radiation:  7 months  02/14/2016-02/18/2016: 1. The right ilium metastasis was treated to 20 Gy in 4 fractions of 5 Gy 2. The right glenoid metastasis was treated to 20 Gy in 4 fractions of 5 Gy  Narrative:  The patient returns today for routine follow-up since completing his radiotherapy. He continues on clinical trial with Dr. Julien Nordmann and had a CT scan on 03/22/16 that revealed  essentially stable change of his target lesions, without criteria based progression.   Patient reports he woke this morning unable to ambulate. Reports weakness of  Lower extremities but, denies numbness. Taking Decadron 4 mg TID as of 09/16/16 with no evidence of thrush. Oxygen therapy 3 liters via nasal cannula noted. Reports wearing oxygen therapy prn. Reports a poor appetite and taste changes. Reports occasional nausea. Reports consuming one Ensure per day. Patient reports 8/10 tailbone pain. Reports intermittent right shoulder pain. Patient presents in a wheelchair. Patient states improvement since the morning. Patient was able to stand with assistance.   CT on 09/06/16 shows T12 met with epidural extension.   On review of systems, the patient reports that he is doing well overall. He denies any chest pain, shortness of breath, cough, fevers, chills, night sweats, unintended weight changes. He denies any bowel or bladder disturbances,  and denies abdominal pain, nausea or vomiting. He denies any new musculoskeletal or joint aches or pains. A complete review of systems is obtained and is otherwise negative.    ALLERGIES:  is allergic to marinol [dronabinol].  Meds: Current Outpatient Prescriptions  Medication Sig Dispense Refill  . amitriptyline (ELAVIL) 10 MG tablet Take 1 tablet (10 mg total) by mouth at bedtime. 30 tablet 0  . amLODipine (NORVASC) 10 MG tablet Take 10 mg by mouth daily.    Marland Kitchen apixaban (ELIQUIS) 5 MG TABS tablet Take 2 tablets (10 mg total) by mouth 2 (two) times daily. 74 tablet 0  . B Complex-Biotin-FA (B-COMPLEX PO) Take 1 each by mouth daily. Reported on 03/01/2016    . cephALEXin (KEFLEX) 500 MG capsule Take 1 capsule (500 mg total) by mouth every 6 (six) hours. 14 capsule 0  . dexamethasone (DECADRON) 4 MG tablet Take 1 tablet (4 mg total) by mouth 3 (three) times daily. 45 tablet 0  . feeding supplement, ENSURE ENLIVE, (ENSURE ENLIVE) LIQD Take 237 mLs by mouth 2 (two) times daily between meals. 237 mL 12  . HYDROcodone-acetaminophen (NORCO) 5-325 MG per tablet Take 1 tablet by mouth every 6 (six) hours as needed for moderate pain. 30 tablet 0  . lidocaine-prilocaine (EMLA) cream Apply 1 application topically as needed. Pt is to apply cream 1 hour before receiving chemo 30 g 0  . metFORMIN (GLUCOPHAGE) 500 MG tablet Take 500 mg by mouth daily.    . mirtazapine (REMERON) 30 MG tablet Take 1 tablet by mouth at  bedtime 30 tablet 2  . PRESCRIPTION MEDICATION Place 1 each into the right eye every 8 (eight) weeks. Eye injection.    . prochlorperazine (COMPAZINE) 10 MG tablet Take 1 tablet (10 mg total) by mouth every 6 (six) hours as needed for nausea or vomiting. 30 tablet 0  . ranitidine (ZANTAC) 150 MG tablet Take 150 mg by mouth daily.    Marland Kitchen senna-docusate (SENNA S) 8.6-50 MG per tablet Take 2 tablets by mouth daily.    . traMADol (ULTRAM) 50 MG tablet Take 1 tablet (50 mg total) by mouth every 6 (six)  hours as needed. (Patient taking differently: Take 50 mg by mouth every 6 (six) hours as needed for moderate pain. ) 60 tablet 0  . magnesium hydroxide (MILK OF MAGNESIA) 400 MG/5ML suspension Take 30 mLs by mouth daily as needed for mild constipation.     No current facility-administered medications for this encounter.     Physical Findings:  height is 5' 6"  (1.676 m) and weight is 135 lb (61.2 kg). His blood pressure is 146/79 (abnormal) and his pulse is 91. His respiration is 18 and oxygen saturation is 99%.   Pain scale 0/10 In general this is a well appearing Caucasian male in no acute distress. He's alert and oriented x4 and appropriate throughout the examination. Cardiopulmonary assessment is negative for acute distress and he exhibits normal effort. All range of motion is noted of his right upper extremity without any reproducible pain. There are no gait disturbances noted, and the patient has 5+5 strength of his right proximal lower extremity. No reproducible pain is appreciated.  Lab Findings: Lab Results  Component Value Date   WBC 8.7 09/13/2016   WBC 6.8 08/27/2016   HGB 14.5 09/13/2016   HCT 43.8 09/13/2016   PLT 250 09/13/2016    Lab Results  Component Value Date   NA 141 09/13/2016   K 3.8 09/13/2016   CHLORIDE 104 09/13/2016   CO2 26 09/13/2016   GLUCOSE 103 09/13/2016   BUN 12.3 09/13/2016   CREATININE 0.9 09/13/2016   BILITOT 0.44 09/13/2016   ALKPHOS 169 (H) 09/13/2016   AST 20 09/13/2016   ALT 14 09/13/2016   PROT 6.7 09/13/2016   ALBUMIN 2.5 (L) 09/13/2016   CALCIUM 9.2 09/13/2016   ANIONGAP 11 09/13/2016   ANIONGAP 7 08/26/2016    Radiographic Findings: Dg Chest 1 View  Result Date: 09/08/2016 CLINICAL DATA:  Shortness of Breath.  Thoracentesis on Monday. EXAM: CHEST 1 VIEW COMPARISON:  CT 09/06/2016 FINDINGS: Right Port-A-Cath is in place with the tip at the cavoatrial junction. Small bilateral effusions. Diffuse airspace disease throughout the  right lung. Heart is normal size. No real change since prior study. IMPRESSION: Diffuse airspace disease on the right with small bilateral effusions. No change since prior study. Electronically Signed   By: [name]   On: 09/08/2016 09:02   Dg Chest 1 View  Result Date: 09/04/2016 CLINICAL DATA:  Status post thoracentesis. EXAM: CHEST 1 VIEW COMPARISON:  08/25/2016 FINDINGS: Radiographically resolved left pleural effusion without re-expansion edema or pneumothorax. Stable extensive pleural and parenchymal opacification on the right with recent chest CT available. Normal heart size. Porta catheter on the right with tip in stable position. IMPRESSION: No acute finding after left thoracentesis. No visible remaining left pleural fluid. Electronically Signed   By: [name]   On: 09/04/2016 15:25   Dg Chest 2 View  Result Date: 08/25/2016 CLINICAL DATA:  Productive cough, shortness  of breath. EXAM: CHEST  2 VIEW COMPARISON:  Radiographs of April 12, 2015. CT scan of July 12, 2016. FINDINGS: Stable cardiomediastinal silhouette. Interval placement of right internal jugular Port-A-Cath with distal tip in expected position of cavoatrial junction. No pneumothorax is noted. Mild left basilar subsegmental atelectasis is noted. Stable mild loculated right pleural effusion is noted. Right basilar opacity is noted concerning for atelectasis, edema, inflammation or possibly scarring. Bony thorax is unremarkable. IMPRESSION: Mild left basilar subsegmental atelectasis. Interval placement of right internal jugular Port-A-Cath. Stable mild loculated right pleural effusion is noted. Probable stable right basilar opacity is noted as described above. Electronically Signed   By: Marijo Conception, M.D.   On: 08/25/2016 11:35   Ct Chest W Contrast  Result Date: 09/06/2016 CLINICAL DATA:  Restaging right lung cancer with bone metastases. EXAM: CT CHEST, ABDOMEN, AND PELVIS WITH CONTRAST TECHNIQUE: Multidetector  CT imaging of the chest, abdomen and pelvis was performed following the standard protocol during bolus administration of intravenous contrast. CONTRAST:  134m ISOVUE-300 IOPAMIDOL (ISOVUE-300) INJECTION 61% COMPARISON:  CTA chest 08/25/2016. Chest abdomen and pelvis CT from 07/12/2016. FINDINGS: RECIST 1.1 Target Lesions: 1. Masslike opacities/architectural distortion and medial aspect right upper lobe measures 6.5 x 3.5 cm today compared to 6.6 x 3.9 cm previously (image 17 series 2). Non-target Lesions: 1. 8 mm short axis AP window lymph node is unchanged 8 mm 2. 9 mm short axis subcarinal lymph node is stable at 9 mm 3. 11 mm short axis low right paratracheal lymph node is stable 11 mm (image 22 series 2). 4. The chronic small thick walled rim enhancing right pleural effusion is stable. 5. Note nodularity left lower lobe. 6. Right iliac metastatic lesion is present. 7. Right glenoid metastatic involvement this scan visualized. CT CHEST FINDINGS Cardiovascular: Heart size normal. Coronary artery calcification is noted. No pericardial effusion Atherosclerotic calcification is noted in the wall of the thoracic aorta. Mediastinum/Nodes: Tiny bilateral thyroid nodules are stable and likely clinically insignificant. Mediastinal lymphadenopathy is unchanged. There is some probable mild circumferential wall thickening in the midesophagus, stable. Lungs/Pleura: Interstitial and airspace opacity associated with volume loss in the right hemi thorax is unchanged. There is some compressive atelectasis left lower lobe, new in the interval with new associated small left pleural effusion. If this at a.m. IIA is best appreciated in the left lung. Musculoskeletal: Lytic lesion involving the left posterior elements at T12 shows evidence of epidural tumor involvement, as before. Advanced lytic lesion in the T11 vertebral body is stable. Metastatic disease is seen elsewhere in the thoracic spine, right shoulder and right ribs. CT  ABDOMEN PELVIS FINDINGS Hepatobiliary: Inferior liver has a nodular contour are, raising the question of cirrhosis. There is no evidence for gallstones, gallbladder wall thickening, or pericholecystic fluid. Mild distention extrahepatic common duct up to 13 mm compares to 12 mm previously. Pancreas: No focal mass lesion. No dilatation of the main duct. No intraparenchymal cyst. No peripancreatic edema. Spleen: No splenomegaly. No focal mass lesion. Adrenals/Urinary Tract: No adrenal nodule or mass. Tiny hypo attenuating lesions in the kidneys bilaterally are too small to characterize but likely represent cysts. No evidence for hydroureter. The urinary bladder appears normal for the degree of distention. Stomach/Bowel: Stomach is unremarkable. Duodenum is normally positioned as is the ligament of Treitz. No small bowel wall thickening. No small bowel dilatation. The terminal ileum is normal. The appendix is normal. No gross colonic mass. No colonic wall thickening. No substantial diverticular change. Vascular/Lymphatic: There is  abdominal aortic atherosclerosis without aneurysm. There is no gastrohepatic or hepatoduodenal ligament lymphadenopathy. No intraperitoneal or retroperitoneal lymphadenopathy. No pelvic sidewall lymphadenopathy. Reproductive: Prostate gland is enlarged. Other: No intraperitoneal free fluid. Musculoskeletal: Lytic lesion left iliac crest is stable. Large destructive lesion right iliac bone is unchanged. IMPRESSION: 1. Stable appearance right upper lobe lesion . 2. Stable mediastinal lymphadenopathy. 3. Stable post treatment changes right lung. The interstitial and airspace disease towards the right lower lung is indeterminate etiology but stable. This may be related to treatment FX, but lymphangitic tumor remains a consideration. 4. Stable thick walled rim enhancing right pleural fluid collection with new free-flowing left pleural fluid on today's study. 5. Destructive bony metastatic disease  in the chest, abdomen, and pelvis. 6. Changes in the liver morphology compatible with cirrhosis. 7. Coronary artery atherosclerosis. Electronically Signed   By: Misty Stanley M.D.   On: 09/06/2016 16:46   Ct Angio Chest Pe W And/or Wo Contrast  Result Date: 08/25/2016 CLINICAL DATA:  Metastatic lung cancer.  Shortness of breath. EXAM: CT ANGIOGRAPHY CHEST WITH CONTRAST TECHNIQUE: Multidetector CT imaging of the chest was performed using the standard protocol during bolus administration of intravenous contrast. Multiplanar CT image reconstructions and MIPs were obtained to evaluate the vascular anatomy. CONTRAST:  57 cc of Isovue 370 COMPARISON:  07/12/2016 RECIST 1.1 Target Lesions: 1. Mass-like area of architectural distortion in the medial aspect of the right upper lobe measures 6.6 x 3.3 cm (image 27 of series 4) previously 6.6 x 3.9 cm Non-target Lesions: 1.  7 mm short axis AP window lymph node (image 34of series 4), stable. 2. 8 mm short axis subcarinal lymph node (image 40 of series 4), previously 9 mm. 3. 11 mm short axis low right paratracheal lymph node (image 35 of series 4), stable. 5. Small chronic thick-walled rim enhancing right pleural effusion, Slightly increased in volume from previous exam, image 60 of series 4. 6. Left lower lobe pulmonary nodularity not visualized. 7. Right glenoid metastasis, present, image 18 is series 4 FINDINGS: Mediastinum: Normal heart size. No pericardial effusion. Aortic atherosclerosis noted. Calcification within the LAD and RCA coronary artery noted. The trachea appears patent and midline. Mild diffuse wall thickening involving the esophagus. The main pulmonary artery is patent. There is abnormal filling defect within the left dx lower lobe pulmonary artery, image 120 of series 10. Lungs/Pleura: New moderate left pleural effusion with overlying compressive type atelectasis and consolidation. There has been slight increase in volume of loculated effusion overlying  the posterior right lower lobe. There is progressive soft tissue consolidation and interstitial thickening involving the right mid and lower lung zone. Similar appearance of masslike consolidation overlying the right lung apex. Upper Abdomen: No focal liver abnormality. The liver has a nodular contour or and there is hypertrophy of the caudate lobe and lateral segment of left lobe. No adrenal abnormality identified. The visualized portions of the pancreas and spleen are normal. Musculoskeletal: Her mid to metastasis involving the right glenoid and scapula is again identified. Subjectively, there appears to be progression of this lesion with further loss of bone. Expansile lytic lesion involving the lateral aspect of the right 6 rib is identified with surrounding soft tissue component measures 3.4 by 2.3 cm, image 56 of series 4. On the previous exam this measured 2.1 x 3.4 cm. Lytic lesion involving the pedicle of T11 measures 3.7 cm, image 89 of series 4. Previously 3.5 cm. a left-sided canal involvement is suspected. Cannot rule out spinal stenosis at  this level. Review of the MIP images confirms the above findings. IMPRESSION: 1. Examination is positive for acute pulmonary embolus to the left lower lobe. 2. New moderate volume left pleural effusion and slight increase in volume of loculated right effusion. 3. Worsening aeration to the right lung. 4. Mild progression of multi focal lytic bone metastasis 5. Aortic atherosclerosis and coronary artery disease. Critical Value/emergent results were called by telephone at the time of interpretation on 08/25/2016 at 2:12 pm to Dr. Virgel Manifold , who verbally acknowledged these results. Electronically Signed   By: Kerby Moors M.D.   On: 08/25/2016 14:12   Ct Abdomen Pelvis W Contrast  Result Date: 09/06/2016 CLINICAL DATA:  Restaging right lung cancer with bone metastases. EXAM: CT CHEST, ABDOMEN, AND PELVIS WITH CONTRAST TECHNIQUE: Multidetector CT imaging of the  chest, abdomen and pelvis was performed following the standard protocol during bolus administration of intravenous contrast. CONTRAST:  159m ISOVUE-300 IOPAMIDOL (ISOVUE-300) INJECTION 61% COMPARISON:  CTA chest 08/25/2016. Chest abdomen and pelvis CT from 07/12/2016. FINDINGS: RECIST 1.1 Target Lesions: 1. Masslike opacities/architectural distortion and medial aspect right upper lobe measures 6.5 x 3.5 cm today compared to 6.6 x 3.9 cm previously (image 17 series 2). Non-target Lesions: 1. 8 mm short axis AP window lymph node is unchanged 8 mm 2. 9 mm short axis subcarinal lymph node is stable at 9 mm 3. 11 mm short axis low right paratracheal lymph node is stable 11 mm (image 22 series 2). 4. The chronic small thick walled rim enhancing right pleural effusion is stable. 5. Note nodularity left lower lobe. 6. Right iliac metastatic lesion is present. 7. Right glenoid metastatic involvement this scan visualized. CT CHEST FINDINGS Cardiovascular: Heart size normal. Coronary artery calcification is noted. No pericardial effusion Atherosclerotic calcification is noted in the wall of the thoracic aorta. Mediastinum/Nodes: Tiny bilateral thyroid nodules are stable and likely clinically insignificant. Mediastinal lymphadenopathy is unchanged. There is some probable mild circumferential wall thickening in the midesophagus, stable. Lungs/Pleura: Interstitial and airspace opacity associated with volume loss in the right hemi thorax is unchanged. There is some compressive atelectasis left lower lobe, new in the interval with new associated small left pleural effusion. If this at a.m. IIA is best appreciated in the left lung. Musculoskeletal: Lytic lesion involving the left posterior elements at T12 shows evidence of epidural tumor involvement, as before. Advanced lytic lesion in the T11 vertebral body is stable. Metastatic disease is seen elsewhere in the thoracic spine, right shoulder and right ribs. CT ABDOMEN PELVIS  FINDINGS Hepatobiliary: Inferior liver has a nodular contour are, raising the question of cirrhosis. There is no evidence for gallstones, gallbladder wall thickening, or pericholecystic fluid. Mild distention extrahepatic common duct up to 13 mm compares to 12 mm previously. Pancreas: No focal mass lesion. No dilatation of the main duct. No intraparenchymal cyst. No peripancreatic edema. Spleen: No splenomegaly. No focal mass lesion. Adrenals/Urinary Tract: No adrenal nodule or mass. Tiny hypo attenuating lesions in the kidneys bilaterally are too small to characterize but likely represent cysts. No evidence for hydroureter. The urinary bladder appears normal for the degree of distention. Stomach/Bowel: Stomach is unremarkable. Duodenum is normally positioned as is the ligament of Treitz. No small bowel wall thickening. No small bowel dilatation. The terminal ileum is normal. The appendix is normal. No gross colonic mass. No colonic wall thickening. No substantial diverticular change. Vascular/Lymphatic: There is abdominal aortic atherosclerosis without aneurysm. There is no gastrohepatic or hepatoduodenal ligament lymphadenopathy. No intraperitoneal or  retroperitoneal lymphadenopathy. No pelvic sidewall lymphadenopathy. Reproductive: Prostate gland is enlarged. Other: No intraperitoneal free fluid. Musculoskeletal: Lytic lesion left iliac crest is stable. Large destructive lesion right iliac bone is unchanged. IMPRESSION: 1. Stable appearance right upper lobe lesion . 2. Stable mediastinal lymphadenopathy. 3. Stable post treatment changes right lung. The interstitial and airspace disease towards the right lower lung is indeterminate etiology but stable. This may be related to treatment FX, but lymphangitic tumor remains a consideration. 4. Stable thick walled rim enhancing right pleural fluid collection with new free-flowing left pleural fluid on today's study. 5. Destructive bony metastatic disease in the chest,  abdomen, and pelvis. 6. Changes in the liver morphology compatible with cirrhosis. 7. Coronary artery atherosclerosis. Electronically Signed   By: Misty Stanley M.D.   On: 09/06/2016 16:46   US Thoracentesis Asp Pleural Space W/img Guide  Result Date: 09/04/2016 INDICATION: Patient with history of lung cancer, left pleural effusion; request made for diagnostic and therapeutic left thoracentesis. EXAM: ULTRASOUND GUIDED DIAGNOSTIC AND THERAPEUTIC LEFT THORACENTESIS MEDICATIONS: None. COMPLICATIONS: None immediate. PROCEDURE: An ultrasound guided thoracentesis was thoroughly discussed with the patient and questions answered. The benefits, risks, alternatives and complications were also discussed. The patient understands and wishes to proceed with the procedure. Written consent was obtained. Ultrasound was performed to localize and mark an adequate pocket of fluid in the left chest. The area was then prepped and draped in the normal sterile fashion. 1% Lidocaine was used for local anesthesia. Under ultrasound guidance a Safe-T-Centesis catheter was introduced. Thoracentesis was performed. The catheter was removed and a dressing applied. FINDINGS: A total of approximately 850 cc of slightly hazy, yellow fluid was removed. Samples were sent to the laboratory as requested by the clinical team. IMPRESSION: Successful ultrasound guided diagnostic and therapeutic left thoracentesis yielding 850 cc of pleural fluid. Read by: Rowe Robert, PA-C Electronically Signed   By: Marybelle Killings M.D.   On: 09/04/2016 14:54    Impression/Plan: 78. 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 He also may benefit from MRI imaging to assess the extent of cord compression. Accordingly, we will proceed with CT simulation as well as MRI  today.     Carola Rhine, PAC  This document serves as a record of services personally performed by Tyler Pita, MD. It was created on his behalf by Bethann Humble, a trained medical scribe. The creation of this record is based on the scribe's personal observations and the provider's statements to them. This document has been checked and approved by the attending provider.      Please see the note from Shona Simpson, PA-C from today's visit for more details of today's encounter.  I have personally performed a face to face diagnostic evaluation on this patient and devised the assessment and plan.   ------------------------------------------------   Tyler Pita, MD [place] Director and Director of [facility]reot[facility] Radiosur[facility] Direct Dial: [phone]  Fax: [phone] [email or web address]  Skype  [email or web address]

## 2016-09-18 NOTE — Progress Notes (Signed)
Histology and Location of Primary Cancer: Stage IV (T3, N3, M1b) non-small cell lung cancer, adenocarcinoma diagnosed in January 2016 presented with right upper lobe lung mass  Sites of Visceral and Bony Metastatic Disease: mediastinal lymphadenopathy and large right pleural effusion as well as bone metastasis   Location(s) of Symptomatic Metastases: T12  Past/Anticipated chemotherapy by medical oncology, if any:  PRIOR THERAPY:  1. Status post Pleurx catheter placement by Dr. Servando Snare for drainage of the right pleural effusion.  2. Systemic chemotherapy with carboplatin for AUC of 5 and Alimta 500 MG/M2 every 3 weeks. Status post 6 cycles. 3. Status post palliative radiotherapy to the metastatic bone lesion in the right glenoid as well as the right ilium area completed 02/18/2016.  4. Treatment according to the BMS checkmate 370 clinical trial and has been randomized to group A arm B-3 to receive Nivolumab 240 mg given every 2 weeks. First cycle expected 06/10/2015. Status post 32 cycles. Last cycle was given 08/30/2016 discontinued secondary to disease progression.  CURRENT THERAPY: None.     Pain on a scale of 0-10 is: Reports constant pain in his tailbone 8 on a scale of 0-10. Reports intermittent right shoulder pain.    If Spine Met(s), symptoms, if any, include:  Bowel/Bladder retention or incontinence (please describe): No.   Numbness or weakness in extremities (please describe): Intermittent numbness right arm. Denies numbness of lower extremities.  Current Decadron regimen, if applicable: decadron 4 mg tid  Ambulatory status? Walker? Wheelchair?: Wheelchair  SAFETY ISSUES:  Prior radiation? yes  Pacemaker/ICD? no  Possible current pregnancy? no  Is the patient on methotrexate? no  Current Complaints / other details:  81 year old male. Patient reports he woke this morning unable to ambulate. Reports weakness of lower extremities but, denies numbness. Taking  decadron 4 mg tid with no evidence of thrush. Oxygen therapy 3 liters via nasal cannula noted. Reports wearing oxygen therapy prn. Reports a poor appetite and taste changes. Reports occasional nausea. Reports consuming one Ensure per day.

## 2016-09-18 NOTE — Progress Notes (Signed)
See progress note under physician encounter. 

## 2016-09-18 NOTE — Telephone Encounter (Signed)
Spoke to pt . He is weak , asking to be seen today .Reports R hand cold for several days and weaker than left side. He has to lean on his wife to walk. Speech clear , denies any other problems. I strongly encouraged wife  to take him to ED. He wants to see Dr Tammi Klippel .  Pt added to [facility]

## 2016-09-19 ENCOUNTER — Ambulatory Visit
Admission: RE | Admit: 2016-09-19 | Discharge: 2016-09-19 | Disposition: A | Discharge status: Home / Self Care | Payer: Medicare Other | Source: Ambulatory Visit | Attending: Radiation Oncology | Admitting: Radiation Oncology

## 2016-09-19 ENCOUNTER — Inpatient Hospital Stay (HOSPITAL_COMMUNITY)
Admission: EM | Admit: 2016-09-19 | Discharge: 2016-10-18 | DRG: 543 | Disposition: E | Payer: Medicare Other | Attending: Internal Medicine | Admitting: Internal Medicine

## 2016-09-19 ENCOUNTER — Encounter: Payer: Self-pay | Admitting: Radiation Oncology

## 2016-09-19 ENCOUNTER — Encounter (HOSPITAL_COMMUNITY): Payer: Self-pay

## 2016-09-19 ENCOUNTER — Telehealth: Payer: Self-pay | Admitting: Radiation Oncology

## 2016-09-19 ENCOUNTER — Emergency Department (HOSPITAL_COMMUNITY): Payer: Medicare Other

## 2016-09-19 VITALS — BP 111/53 | HR 98 | Temp 97.7°F | Resp 18

## 2016-09-19 DIAGNOSIS — Z515 Encounter for palliative care: Secondary | ICD-10-CM | POA: Diagnosis present

## 2016-09-19 DIAGNOSIS — Z6821 Body mass index (BMI) 21.0-21.9, adult: Secondary | ICD-10-CM

## 2016-09-19 DIAGNOSIS — K219 Gastro-esophageal reflux disease without esophagitis: Secondary | ICD-10-CM | POA: Diagnosis present

## 2016-09-19 DIAGNOSIS — E440 Moderate protein-calorie malnutrition: Secondary | ICD-10-CM | POA: Diagnosis present

## 2016-09-19 DIAGNOSIS — Z809 Family history of malignant neoplasm, unspecified: Secondary | ICD-10-CM | POA: Diagnosis not present

## 2016-09-19 DIAGNOSIS — C7952 Secondary malignant neoplasm of bone marrow: Principal | ICD-10-CM

## 2016-09-19 DIAGNOSIS — Z86711 Personal history of pulmonary embolism: Secondary | ICD-10-CM

## 2016-09-19 DIAGNOSIS — C7951 Secondary malignant neoplasm of bone: Secondary | ICD-10-CM

## 2016-09-19 DIAGNOSIS — Z79899 Other long term (current) drug therapy: Secondary | ICD-10-CM

## 2016-09-19 DIAGNOSIS — Z923 Personal history of irradiation: Secondary | ICD-10-CM | POA: Diagnosis not present

## 2016-09-19 DIAGNOSIS — Z7984 Long term (current) use of oral hypoglycemic drugs: Secondary | ICD-10-CM | POA: Diagnosis not present

## 2016-09-19 DIAGNOSIS — H3530 Unspecified macular degeneration: Secondary | ICD-10-CM | POA: Diagnosis present

## 2016-09-19 DIAGNOSIS — R180 Malignant ascites: Secondary | ICD-10-CM | POA: Diagnosis present

## 2016-09-19 DIAGNOSIS — C3411 Malignant neoplasm of upper lobe, right bronchus or lung: Secondary | ICD-10-CM | POA: Diagnosis present

## 2016-09-19 DIAGNOSIS — R410 Disorientation, unspecified: Secondary | ICD-10-CM | POA: Diagnosis present

## 2016-09-19 DIAGNOSIS — Z66 Do not resuscitate: Secondary | ICD-10-CM | POA: Diagnosis present

## 2016-09-19 DIAGNOSIS — Z87891 Personal history of nicotine dependence: Secondary | ICD-10-CM

## 2016-09-19 DIAGNOSIS — J910 Malignant pleural effusion: Secondary | ICD-10-CM | POA: Diagnosis present

## 2016-09-19 DIAGNOSIS — E119 Type 2 diabetes mellitus without complications: Secondary | ICD-10-CM

## 2016-09-19 DIAGNOSIS — Z7901 Long term (current) use of anticoagulants: Secondary | ICD-10-CM

## 2016-09-19 DIAGNOSIS — I10 Essential (primary) hypertension: Secondary | ICD-10-CM | POA: Diagnosis present

## 2016-09-19 DIAGNOSIS — Z9221 Personal history of antineoplastic chemotherapy: Secondary | ICD-10-CM

## 2016-09-19 DIAGNOSIS — Z9889 Other specified postprocedural states: Secondary | ICD-10-CM | POA: Diagnosis not present

## 2016-09-19 DIAGNOSIS — Z823 Family history of stroke: Secondary | ICD-10-CM | POA: Diagnosis not present

## 2016-09-19 DIAGNOSIS — R269 Unspecified abnormalities of gait and mobility: Secondary | ICD-10-CM

## 2016-09-19 DIAGNOSIS — R262 Difficulty in walking, not elsewhere classified: Secondary | ICD-10-CM

## 2016-09-19 LAB — CBC WITH DIFFERENTIAL/PLATELET
Basophils Absolute: 0 10*3/uL (ref 0.0–0.1)
Basophils Relative: 0 %
Eosinophils Absolute: 0 10*3/uL (ref 0.0–0.7)
Eosinophils Relative: 0 %
HCT: 39.6 % (ref 39.0–52.0)
Hemoglobin: 13.4 g/dL (ref 13.0–17.0)
Lymphocytes Relative: 5 %
Lymphs Abs: 0.7 10*3/uL (ref 0.7–4.0)
MCH: 33 pg (ref 26.0–34.0)
MCHC: 33.8 g/dL (ref 30.0–36.0)
MCV: 97.5 fL (ref 78.0–100.0)
Monocytes Absolute: 0.7 10*3/uL (ref 0.1–1.0)
Monocytes Relative: 5 %
Neutro Abs: 12.1 10*3/uL — ABNORMAL HIGH (ref 1.7–7.7)
Neutrophils Relative %: 90 %
Platelets: 114 10*3/uL — ABNORMAL LOW (ref 150–400)
RBC: 4.06 MIL/uL — ABNORMAL LOW (ref 4.22–5.81)
RDW: 13.8 % (ref 11.5–15.5)
WBC: 13.5 10*3/uL — ABNORMAL HIGH (ref 4.0–10.5)

## 2016-09-19 LAB — BASIC METABOLIC PANEL
Anion gap: 6 (ref 5–15)
BUN: 17 mg/dL (ref 6–20)
CO2: 27 mmol/L (ref 22–32)
Calcium: 8.4 mg/dL — ABNORMAL LOW (ref 8.9–10.3)
Chloride: 106 mmol/L (ref 101–111)
Creatinine, Ser: 0.63 mg/dL (ref 0.61–1.24)
GFR calc Af Amer: >60 mL/min (ref >60)
GFR calc non Af Amer: >60 mL/min (ref >60)
Glucose, Bld: 136 mg/dL — ABNORMAL HIGH (ref 65–99)
Potassium: 3.9 mmol/L (ref 3.5–5.1)
Sodium: 139 mmol/L (ref 135–145)

## 2016-09-19 MED ORDER — DEXAMETHASONE SODIUM PHOSPHATE 10 MG/ML IJ SOLN
10.0000 mg | Freq: Once | INTRAMUSCULAR | Status: AC
  Administered 2016-09-19: 10 mg via INTRAVENOUS
  Filled 2016-09-19: qty 1

## 2016-09-19 MED ORDER — LORAZEPAM 2 MG/ML IJ SOLN
1.0000 mg | Freq: Once | INTRAMUSCULAR | Status: AC
  Administered 2016-09-19: 1 mg via INTRAVENOUS
  Filled 2016-09-19: qty 1

## 2016-09-19 MED ORDER — GADOBENATE DIMEGLUMINE 529 MG/ML IV SOLN
12.0000 mL | Freq: Once | INTRAVENOUS | Status: AC | PRN
  Administered 2016-09-19: 12 mL via INTRAVENOUS

## 2016-09-19 NOTE — Telephone Encounter (Signed)
Received message from Mrs. Omar Peters requesting return call. Phoned back minutes later. Mrs. Omar Peters reports that she and her husband arrived home last night around 9 pm after his MRI and he was unable to stand. She goes onto say neighbors had to come over and carry him into the house from the car. She explains he has been in his lazy boy chair ever since. She is requesting aid. Immediately informed Shona Simpson, PA-C of these findings. Explained to Mrs. Omar Peters that per Omar Peters Dr. Tammi Peters is coming in to review her husband's MRI and will provide further directions about the next steps in his care after he sees the images.

## 2016-09-19 NOTE — ED Notes (Addendum)
MD at bedside and family at the bedside. Pt resting.

## 2016-09-19 NOTE — Progress Notes (Signed)
Wheeled patient to emergency room accompanied by wife and son. Delivered patient to exam 11. Provided report to [place], Therapist, sports. Assisted with transfer from wheelchair to bed. Patient alert and oriented x3 and in no distress upon my exit.

## 2016-09-19 NOTE — Progress Notes (Signed)
Received patient, his wife, and son in the clinic following second radiation treatment. Vitals stable. Denies pain at this time. Patient alert and oriented x 3. Wife reports following MRI last night the patient could no longer stand. Also, wife reports the patient is more agitated and restless. She reports he fell off the couch last night but, did not hit his head or sustain any injuries. Bryson Ha to discuss next step in treatment with family.   BP (!) 111/53 (BP Location: Left Arm, Patient Position: Sitting, Cuff Size: Normal)   Pulse 98   Temp 97.7 F (36.5 C) (Oral)   Resp 18   SpO2 99%  Wt Readings from Last 3 Encounters:  09/18/16 135 lb (61.2 kg)  09/13/16 135 lb 1.6 oz (61.3 kg)  09/08/16 132 lb 8 oz (60.1 kg)

## 2016-09-19 NOTE — ED Provider Notes (Signed)
Medical screening examination/treatment/procedure(s) were performed by non-physician practitioner and as supervising physician I was immediately available for consultation/collaboration.   EKG Interpretation None     81 year old male with history of lung cancer with metastasis presents with trouble standing and walking began yesterday. An MRI of his thoracic spine which did show new lesions. We'll have a lumbar MRI tonight and will be admitted for radiation treatment.   Lacretia Leigh, MD 10/06/2016 [phone]

## 2016-09-19 NOTE — H&P (Signed)
History and Physical    Omar Peters IPJ:[id number] DOB: [birth year] DOA: 09/18/2016  PCP: Tamsen Roers, MD Consultants:  Tammi Klippel - rad onc; Mohamed - onc; [facility] - ophthalmology Patient coming from: home - lives with wife  Chief Complaint: inability to stand/walk  HPI: Omar Peters is a 81 y.o. male with medical history significant of HTN, GERD, macular degeneration, DM, and stage IV lung cancer (non-small cell) presenting with metastatic disease to the spine and inability to walk or stand.  His wife provided the entire history, as the patient had been sedated and was comfortably sleeping for the entire duration of the encounter.  She reports that the patient was diagnosed 12/31/14 with cancer (non-small cell lung CA).  Had chemotherapy.  During that time, he developed pleural fluid and had a pleural catheter placed and wife had to drain it for about 3 months.  Was able to discontinue Pleurx.  Went on Nivolumab immunotherapy clinical trial with improvement in cancer in lung, hip and shoulder.  Had a blood clot about 3 weeks ago in right leg, with PE.  Lung started filling up with fluid, then it was drained and they found cancer cells in pleural fluid.  Was fairly independent until recently.  He was weak and didn't feel like doing much.  Then he developed further mets to the spine within the last few weeks - decided to do radiation therapy to T11-12 (mostly 12).  Sunday night, he went to bed able to walk.  Monday AM, he was unable to stand or walk.  It got worse throughout the day yesterday.  Saw [name] yesterday and had radiation therapy both yesterday and today. Last night, wife was unable to get him out of the car, had to car a neighbor for assistance.  He slept on the couch, fell off overnight and once again wife couldn't get him up.  She called her niece and husband about 11pm and they came and helped him into a recliner, wife slept on couch.  He was still unable to get out of recliner,  used the urinal. His feet just dangled and dragged.  He slipped out of recliner and fell again and wife couldn't get him up; called neighbor again and they got him back up again. He is confused and calls her without making sense - she thinks he is dehydrated but he has not had a head CT.  He also won't let her out of his sight, which is very unusual for him.  He needed to have a BM so his son and a friend came to help him to the bathroom and he did have a good BM; they helped him to the car.  Came over for radiation and then went for MRI today.  Was sent from there to the ER for admission.  ED Course:  Per Dr. Zenia Resides: 81 year old male with history of lung cancer with metastasis presents with trouble standing and walking began yesterday. An MRI of his thoracic spine which did show new lesions. We'll have a lumbar MRI tonight and will be admitted for radiation treatment.    Review of Systems: As per HPI; otherwise 10 point review of systems reviewed and negative.   Ambulatory Status:  Non-ambulatory as of recently  Wife of 4 years (2nd marriage).  Has 4 grown children.  Wife is HCPOA.  Past Medical History:  Diagnosis Date  . Anxiety    due to diagnosis of Stage 4 Lung Cancer  . Bone cancer ([place])  non small cell lung with mets to bone  . Diabetes mellitus without complication ([place])   . GERD (gastroesophageal reflux disease)   . Hypertension   . Lung cancer ([place])    stage IV non small cell lung cancer dx. January 2016   . Macular degeneration of right eye   . Shortness of breath dyspnea    01/14/15 at night and has to sit up    Past Surgical History:  Procedure Laterality Date  . CHEST TUBE INSERTION Right 01/15/2015   Procedure: INSERTION PLEURAL DRAINAGE CATHETER WITH ULTRASOUND AND FLURO GUIDANCE;  Surgeon: Grace Isaac, MD;  Location: [facility];  Service: Thoracic;  Laterality: Right;  . REMOVAL OF PLEURAL DRAINAGE CATHETER Right 03/26/2015   Procedure: REMOVAL OF PLEURAL DRAINAGE  CATHETER;  Surgeon: Grace Isaac, MD;  Location: [facility];  Service: Thoracic;  Laterality: Right;  . VEIN LIGATION AND [place]    Social History   Social History  . Marital status: Married    Spouse name: N/A  . Number of children: N/A  . Years of education: N/A   Occupational History  . retired    Social History Main Topics  . Smoking status: Former Smoker    Packs/day: 1.00    Years: 50.00    Quit date: 12/14/2014  . Smokeless tobacco: Current User     Comment: restarted but quit when he was stared on oxygen for PE  . Alcohol use 1.2 oz/week    2 Cans of beer per week     Comment: 2-3  beers day  . Drug use: No  . Sexual activity: No   Other Topics Concern  . Not on file   Social History Narrative  . No narrative on file    Allergies  Allergen Reactions  . Marinol [Dronabinol] Other (See Comments)    Confusion He does not take this any more    Family History  Problem Relation Age of Onset  . Stroke Mother   . Cancer Father     pancreatic  . Cancer Brother     lung  . Cancer Brother     lung    Prior to Admission medications   Medication Sig Start Date End Date Taking? Authorizing Provider  amLODipine (NORVASC) 10 MG tablet Take 10 mg by mouth every morning.    Yes Historical Provider, MD  apixaban (ELIQUIS) 5 MG TABS tablet Take 2 tablets (10 mg total) by mouth 2 (two) times daily. 08/27/16  Yes Debbe Odea, MD  B Complex-Biotin-FA (B-COMPLEX PO) Take 1 each by mouth every morning. Reported on 03/01/2016   Yes Historical Provider, MD  dexamethasone (DECADRON) 4 MG tablet Take 1 tablet (4 mg total) by mouth 3 (three) times daily. Patient taking differently: Take 4 mg by mouth 3 (three) times daily. Decadron was increased from tid to qid on 09/18/16. 09/15/16  Yes Curt Bears, MD  feeding supplement, ENSURE ENLIVE, (ENSURE ENLIVE) LIQD Take 237 mLs by mouth 2 (two) times daily between meals. 08/27/16  Yes Debbe Odea, MD    HYDROcodone-acetaminophen (NORCO) 5-325 MG per tablet Take 1 tablet by mouth every 6 (six) hours as needed for moderate pain. 01/15/15  Yes Grace Isaac, MD  lidocaine-prilocaine (EMLA) cream Apply 1 application topically as needed. Pt is to apply cream 1 hour before receiving chemo 06/21/16  Yes Curt Bears, MD  magnesium hydroxide (MILK OF MAGNESIA) 400 MG/5ML suspension Take 30 mLs by mouth daily as needed for mild  constipation.   Yes Historical Provider, MD  metFORMIN (GLUCOPHAGE) 500 MG tablet Take 500 mg by mouth daily with breakfast.  12/17/15  Yes Historical Provider, MD  mirtazapine (REMERON) 30 MG tablet Take 1 tablet by mouth at  bedtime Patient taking differently: Take 30 mg by mouth at  bedtime 08/26/16  Yes Curt Bears, MD  [place] 1 each into the right eye every 8 (eight) weeks. Eye injection.   Yes Historical Provider, MD  prochlorperazine (COMPAZINE) 10 MG tablet Take 1 tablet (10 mg total) by mouth every 6 (six) hours as needed for nausea or vomiting. 05/16/16  Yes Curt Bears, MD  ranitidine (ZANTAC) 150 MG tablet Take 150 mg by mouth every evening.    Yes Historical Provider, MD  senna-docusate (SENNA S) 8.6-50 MG per tablet Take 1 tablet by mouth 2 (two) times daily.    Yes Historical Provider, MD  traMADol (ULTRAM) 50 MG tablet Take 1 tablet (50 mg total) by mouth every 6 (six) hours as needed. Patient taking differently: Take 50 mg by mouth every 6 (six) hours as needed for moderate pain.  08/16/16  Yes Curt Bears, MD  amitriptyline (ELAVIL) 10 MG tablet Take 1 tablet (10 mg total) by mouth at bedtime. Patient not taking: Reported on 09/18/2016 05/10/16   Owens Shark, NP  cephALEXin (KEFLEX) 500 MG capsule Take 1 capsule (500 mg total) by mouth every 6 (six) hours. Patient not taking: Reported on 10/04/2016 08/27/16   Debbe Odea, MD    Physical Exam: Vitals:   09/25/2016 2237 10/14/2016 2341 09/20/16 0011 09/20/16 0125  BP: 118/66 102/58  103/57 129/67  Pulse: 101 80 82 89  Resp: '14 14 14 16  '$ Temp:    (!) 96.6 F (35.9 C)  TempSrc:    Axillary  SpO2: 97% 100% 100% 100%  Weight:    60.6 kg (133 lb 8 oz)  Height:    '5\' 6"'$  (1.676 m)     General: Appears calm and comfortable and is NAD, snoring softly for the entire encounter Eyes: closed while sleeping ENT: hard of hearing, normal lips & tongue, mmm Neck: no LAD, masses or thyromegaly.  R chest wall with port-a-cath in place. Cardiovascular:  RRR, no m/r/g.  Respiratory:  CTA bilaterally, no w/r/r. Normal respiratory effort. Abdomen:  soft, ntnd, NABS; + fluid wave c/w ascites Skin:  no rash or induration seen on limited exam Musculoskeletal:  grossly normal tone BUE/BLE,no bony abnormality; RLE is still larger in diameter than left with mild edema Psychiatric: sleeping throughout evaluation Neurologic: unable to perform  Labs on Admission: I have personally reviewed following labs and imaging studies  CBC:  Recent Labs Lab 09/13/16 0910 09/24/2016 1744  WBC 8.7 13.5*  NEUTROABS 5.9 12.1*  HGB 14.5 13.4  HCT 43.8 39.6  MCV 98.8* 97.5  PLT 250 502*   Basic Metabolic Panel:  Recent Labs Lab 09/13/16 0907 09/13/16 0910 09/17/2016 1744  NA  --  141 139  K  --  3.8 3.9  CL  --   --  106  CO2  --  26 27  GLUCOSE  --  103 136*  BUN  --  12.3 17  CREATININE  --  0.9 0.63  CALCIUM  --  9.2 8.4*  MG  --  2.1  --   PHOS 3.7  --   --    GFR: Estimated Creatinine Clearance: 65.2 mL/min (by C-G formula based on SCr of 0.63 mg/dL). Liver Function Tests:  Recent Labs Lab 09/13/16 0910  AST 20  ALT 14  ALKPHOS 169*  BILITOT 0.44  PROT 6.7  ALBUMIN 2.5*   No results for input(s): LIPASE, AMYLASE in the last 168 hours. No results for input(s): AMMONIA in the last 168 hours. Coagulation Profile: No results for input(s): INR, PROTIME in the last 168 hours. Cardiac Enzymes: No results for input(s): CKTOTAL, CKMB, CKMBINDEX, TROPONINI in the last 168  hours. BNP (last 3 results) No results for input(s): PROBNP in the last 8760 hours. HbA1C: No results for input(s): HGBA1C in the last 72 hours. CBG: No results for input(s): GLUCAP in the last 168 hours. Lipid Profile: No results for input(s): CHOL, HDL, LDLCALC, TRIG, CHOLHDL, LDLDIRECT in the last 72 hours. Thyroid Function Tests: No results for input(s): TSH, T4TOTAL, FREET4, T3FREE, THYROIDAB in the last 72 hours. Anemia Panel: No results for input(s): VITAMINB12, FOLATE, FERRITIN, TIBC, IRON, RETICCTPCT in the last 72 hours. Urine analysis:    Component Value Date/Time   COLORURINE YELLOW 12/01/2015 2022   APPEARANCEUR CLEAR 12/01/2015 2022   LABSPEC 1.026 12/01/2015 2022   PHURINE 8.0 12/01/2015 2022   GLUCOSEU NEGATIVE 12/01/2015 2022   HGBUR NEGATIVE 12/01/2015 2022   [place] NEGATIVE 12/01/2015 2022   KETONESUR NEGATIVE 12/01/2015 2022   PROTEINUR NEGATIVE 12/01/2015 2022   NITRITE NEGATIVE 12/01/2015 2022   LEUKOCYTESUR NEGATIVE 12/01/2015 2022    Creatinine Clearance: Estimated Creatinine Clearance: 65.2 mL/min (by C-G formula based on SCr of 0.63 mg/dL).  Sepsis Labs: '@LABRCNTIP'$ (procalcitonin:4,lacticidven:4) )No results found for this or any previous visit (from the past 240 hour(s)).   Radiological Exams on Admission: Mr Thoracic Spine W Wo Contrast  Result Date: 09/18/2016 CLINICAL DATA:  Weakness in legs. Bowel and bladder changes. History of lung cancer. EXAM: MRI THORACIC SPINE WITHOUT AND WITH CONTRAST TECHNIQUE: Multiplanar and multiecho pulse sequences of the thoracic spine were obtained without and with intravenous contrast. CONTRAST:  98m MULTIHANCE GADOBENATE DIMEGLUMINE 529 MG/ML IV SOLN COMPARISON:  Chest CT 09/06/2016 FINDINGS: There is normal alignment without static subluxation. There are numerous low T1 weighted signal lesions throughout the thoracic spine vertebral bodies. There is complete replacement of the normal marrow signal of the T1  and T11 vertebral bodies. There is extensive involvement of the left posterior elements at T6, T11 and T12 with associated encroachment on the left T5-6, T6-7, T11-12 and T12-L1 neural foramina. There is epidural extension of disease along the left posterior aspect of the spinal canal at the T12 level, extending into the neural foramen. There is a cystic area adjacent to the left T12 lamina. There is no spinal canal stenosis. No degenerative neural foraminal stenosis. No acute compression fracture or evidence of discitis osteomyelitis. There is a large left pleural effusion and a loculated right pleural effusion. There is incompletely visualized consolidative opacity of the right upper lobe. IMPRESSION: 1. Widespread osseous metastatic disease of the thoracic spine with left dorsal epidural extension of disease at the T12 level. 2. Tumor expansion of the posterior elements at T6, T11 and T12 with associated encroachment on the left T5-6, T6-7, T11-12 and T12-L1 neural foramina. Given differences in modalities, it is difficult to determine the degree to which the epidural disease is new/worsened since 09/06/2016. 3. Large bilateral effusions with prominent loculated component on the right. 4. Incompletely evaluated consolidative opacity of the right upper lobe at the site of known tumor. Electronically Signed   By: KUlyses JarredM.D.   On: 09/18/2016 20:31   Mr Lumbar Spine  W Wo Contrast  Result Date: 09/26/2016 CLINICAL DATA:  Unable to walk for 3 days. History of metastatic lung cancer, recent MRI demonstrating new metastasis. EXAM: MRI LUMBAR SPINE WITHOUT AND WITH CONTRAST TECHNIQUE: Multiplanar and multiecho pulse sequences of the lumbar spine were obtained without and with intravenous contrast. CONTRAST:  36m MULTIHANCE GADOBENATE DIMEGLUMINE 529 MG/ML IV SOLN COMPARISON:  MRI thoracic spine September 18, 2016 and CT chest, abdomen and pelvis September 06, 2016 FINDINGS: Multiple sequences are moderately or  severely motion degraded, axial T1 post gadolinium sequences nearly nondiagnostic. SEGMENTATION: For the purposes of this report, the last well-formed intervertebral disc will be described as L5-S1. ALIGNMENT: No malalignment.  Maintenance of the lumbar lordosis. VERTEBRAE:Lumbar vertebral bodies are intact. Low T1, T2 bright enhancing metastasis in all lumbar vertebral bodies except L4. Expansile LEFT is sacral metastasis. Expansile RIGHT iliac metastasis with cystic, hemorrhagic invasion of the RIGHT gluteus muscles, consistent with prior CT results. No lumbosacral pathologic fracture. Mild L4-5 disc height loss with decreased T2 signal within all disc compatible with mild desiccation. Multilevel mild chronic discogenic endplate changes. Included thoracic spine demonstrates expansile metastasis T12 better characterized on recent dedicated study. CONUS MEDULLARIS: Conus medullaris terminates at L1-2 and demonstrates normal morphology and signal characteristics. Cauda equina is normal. No definite abnormal cord, leptomeningeal or epidural enhancement though motion degraded post gadolinium sequences. PARASPINAL AND SOFT TISSUES: Small amount of perihepatic ascites, increased from priors examination. Mild RIGHT iliopsoas and RIGHT paraspinal muscle denervation/interstitial edema. DISC LEVELS: L1-2: Small broad-based RIGHT central disc protrusion. Mild facet arthropathy and ligamentum flavum redundancy. No canal stenosis or neural foraminal narrowing. L2-3: Annular bulging. Mild facet arthropathy and ligamentum flavum redundancy without canal stenosis. Minimal LEFT neural foraminal narrowing. L3-4: Annular bulging. Mild facet arthropathy and ligamentum flavum redundancy without canal stenosis. Minimal bilateral neural foraminal narrowing. L4-5: Small broad-based disc bulge asymmetric to the RIGHT could affect the exited RIGHT L4 nerve. Mild facet arthropathy and ligamentum flavum redundancy. Mild canal stenosis  including partial effacement lateral recesses which could affect the traversing L5 nerves. Mild RIGHT, minimal LEFT neural foraminal narrowing. L5-S1: Small broad-based central disc protrusion. Mild facet arthropathy on the RIGHT. No canal stenosis. Moderate RIGHT, mild LEFT neural foraminal narrowing. IMPRESSION: Motion degraded examination. Lumbosacral and RIGHT iliac osseous metastasis including hemorrhagic metastasis RIGHT gluteal soft tissues. No spinal pathologic fracture. Degenerative change of lumbar spine results in mild canal stenosis L4-5. Neural foraminal narrowing L2-3 through L5-S1: Moderate on the RIGHT at L5-S1. Increasing perihepatic ascites. Electronically Signed   By: CElon AlasM.D.   On: 09/30/2016 22:48    EKG: Not done  Assessment/Plan Principal Problem:   Metastatic cancer to spine ([facility] Active Problems:   Cancer of upper lobe of right lung ([facility])   Diabetes mellitus ([place]   Long term current use of anticoagulant therapy   Metastatic cancer to spine -patient with known Stage IV (T3, N3, M1b) non-small cell lung cancer -Despite prior treatments and recent clinical trial, the cancer is metastasizing at a rapid rate -His entire spine shows widespread osseous metastatic disease -He has had emergent radiation therapy the last 2 days with little effect -He now has some apparent AMS (I was unable to assess due to sedating medications the patient received prior to my arrival, and given his overall condition I did not try hard to awaken him). -I ordered a stat head CT to evaluate for metastatic disease in the brain -He also has recurrent large bilateral pleural effusions which are almost  assuredly malignant; I did repeat a portable CXR to confirm the size of the effusions -He has new and worsening perihepatic ascites which may be related to cirrhosis as per prior CT, but it seems more likely in this setting that it is also malignant in nature -All combined, it appears that  the patient has widespread metastatic disease that is very likely terminal -I discussed this at length with the patient's wife tonight, and she was intermittently tearful, intermittently in denial, and intermittently very accepting and aware of this information - the patient has been telling her that he is dying -For now, she would like for him to remain a limited code (DNI)  -She is willing to have a palliative care consult tomorrow but would like to at least invite his out-of-state children to participate in this discussion -The patient appears to be a likely inpatient hospice candidate, and this would likely be most desirable for the wife as well, since she cannot care for him at home so long as he is totally immobile -Overnight, will admit the patient and order palliative care consult while striving to ensure that the patient is comfortable  Recent PE, on anticoagulation -will continue Eliquis for now  DM -Given the circumstances, I did not order SSI -The day team can reconsider this issue in the AM -He seems very unlikely to have long-term complications from hyperglycemia  *The majority of this visit, which lasted approximately 70 minutes, was spent counseling the wife, providing emotional support, and talking about goals of care.*  DVT prophylaxis: Eliquis Code Status: DNI - confirmed with family Family Communication: Wife present throughout evaluation Disposition Plan: To be determined Consults called: Dr. Julien Nordmann and [name] were added to the treatment team; palliative care consult requested Admission status: Admit to med surg   Karmen Bongo MD [facility]  If 7PM-7AM, please contact night-coverage [email or web address] Password [email or web address]  09/20/2016, 1:53 AM

## 2016-09-19 NOTE — ED Notes (Signed)
Patient transported to MRI 

## 2016-09-19 NOTE — Progress Notes (Signed)
EDCM spoke to patient and his wife at bedside.  Patient's wife Caryl Asp [phone] or [phone]. Joy reports the patient was able to ambulate on Sunday.  Then Sunday night the patient couldn't even stand. Patient's wife reports she assists patient with ADL's at home.  Patient 's wife reports patient has a walker, and a shower chair at home. Patient's wife reports she would like a wheelchair, walker and a bedside commode for patient.  She reports the walker the patient has at home is old. Patient's pcp is Dr. Tamsen Roers confirmed by patient's wife.   EDCM provided patient's wife with list of home health agencies in [facility], explained services.  Patient's wife reports she is definitely going to need assistance at home if the patient can't walk.  Patient's wife reports patient is very anxious and requesting axiety medication prior to MRI.  [facility] informed nurse tech who entered the room, who will inform the patient's RN.  No further EDCM needs at this time.

## 2016-09-19 NOTE — ED Triage Notes (Signed)
Pt is receiving radiation treatment to thoracic and lumbar areas. Pt has been unable to walk or stand independently starting yesterday. Pt has a hx of lung CA w/ mets to spine. Pt arrives from [facility] escorted by RN in wheelchair, [id number],

## 2016-09-20 ENCOUNTER — Telehealth: Payer: Self-pay | Admitting: *Deleted

## 2016-09-20 ENCOUNTER — Inpatient Hospital Stay (HOSPITAL_COMMUNITY): Payer: Medicare Other

## 2016-09-20 ENCOUNTER — Ambulatory Visit: Payer: Medicare Other

## 2016-09-20 DIAGNOSIS — R410 Disorientation, unspecified: Secondary | ICD-10-CM

## 2016-09-20 DIAGNOSIS — C7951 Secondary malignant neoplasm of bone: Principal | ICD-10-CM

## 2016-09-20 DIAGNOSIS — Z7901 Long term (current) use of anticoagulants: Secondary | ICD-10-CM

## 2016-09-20 DIAGNOSIS — E119 Type 2 diabetes mellitus without complications: Secondary | ICD-10-CM

## 2016-09-20 LAB — BASIC METABOLIC PANEL
Anion gap: 4 — ABNORMAL LOW (ref 5–15)
BUN: 21 mg/dL — AB (ref 6–20)
CHLORIDE: 108 mmol/L (ref 101–111)
CO2: 29 mmol/L (ref 22–32)
CREATININE: 0.6 mg/dL — AB (ref 0.61–1.24)
Calcium: 8 mg/dL — ABNORMAL LOW (ref 8.9–10.3)
GFR calc Af Amer: >60 mL/min (ref >60)
GFR calc non Af Amer: >60 mL/min (ref >60)
GLUCOSE: 121 mg/dL — AB (ref 65–99)
Potassium: 4.1 mmol/L (ref 3.5–5.1)
SODIUM: 141 mmol/L (ref 135–145)

## 2016-09-20 LAB — CBC
HCT: 34.1 % — ABNORMAL LOW (ref 39.0–52.0)
Hemoglobin: 11.3 g/dL — ABNORMAL LOW (ref 13.0–17.0)
MCH: 31.7 pg (ref 26.0–34.0)
MCHC: 33.1 g/dL (ref 30.0–36.0)
MCV: 95.5 fL (ref 78.0–100.0)
PLATELETS: 74 10*3/uL — AB (ref 150–400)
RBC: 3.57 MIL/uL — ABNORMAL LOW (ref 4.22–5.81)
RDW: 13.8 % (ref 11.5–15.5)
WBC: 8 10*3/uL (ref 4.0–10.5)

## 2016-09-20 MED ORDER — MORPHINE SULFATE (PF) 4 MG/ML IV SOLN
4.0000 mg | INTRAVENOUS | Status: DC | PRN
  Administered 2016-09-20 – 2016-09-21 (×5): 4 mg via INTRAVENOUS
  Filled 2016-09-20 (×8): qty 1

## 2016-09-20 MED ORDER — SODIUM CHLORIDE 0.9 % IV SOLN
INTRAVENOUS | Status: DC
  Administered 2016-09-21: 1000 mL via INTRAVENOUS

## 2016-09-20 MED ORDER — MIRTAZAPINE 30 MG PO TABS: 30.0000 mg | ORAL_TABLET | Freq: Every day | ORAL | Status: DC

## 2016-09-20 MED ORDER — FAMOTIDINE IN NACL 20-0.9 MG/50ML-% IV SOLN
20.0000 mg | INTRAVENOUS | Status: DC
  Administered 2016-09-21: 20 mg via INTRAVENOUS
  Filled 2016-09-20: qty 50

## 2016-09-20 MED ORDER — LORAZEPAM 2 MG/ML IJ SOLN
1.0000 mg | INTRAMUSCULAR | Status: DC | PRN
  Administered 2016-09-20 (×2): 1 mg via INTRAVENOUS
  Filled 2016-09-20 (×2): qty 1

## 2016-09-20 MED ORDER — ACETAMINOPHEN 650 MG RE SUPP
650.0000 mg | Freq: Four times a day (QID) | RECTAL | Status: DC | PRN
Start: 2016-09-20 — End: 2016-09-22

## 2016-09-20 MED ORDER — MAGNESIUM HYDROXIDE 400 MG/5ML PO SUSP: 30.0000 mL | Freq: Every day | ORAL | Status: DC | PRN

## 2016-09-20 MED ORDER — DEXAMETHASONE 4 MG PO TABS: 4.0000 mg | ORAL_TABLET | Freq: Four times a day (QID) | ORAL | Status: DC

## 2016-09-20 MED ORDER — ONDANSETRON HCL 4 MG PO TABS: 4.0000 mg | ORAL_TABLET | Freq: Four times a day (QID) | ORAL | Status: DC | PRN

## 2016-09-20 MED ORDER — AMLODIPINE BESYLATE 10 MG PO TABS
10.0000 mg | ORAL_TABLET | Freq: Every day | ORAL | Status: DC
  Administered 2016-09-20: 10 mg via ORAL
  Filled 2016-09-20: qty 1

## 2016-09-20 MED ORDER — ORAL CARE MOUTH RINSE
15.0000 mL | Freq: Two times a day (BID) | OROMUCOSAL | Status: DC
  Administered 2016-09-20 (×2): 15 mL via OROMUCOSAL

## 2016-09-20 MED ORDER — ONDANSETRON HCL 4 MG/2ML IJ SOLN: 4.0000 mg | Freq: Four times a day (QID) | INTRAMUSCULAR | Status: DC | PRN

## 2016-09-20 MED ORDER — APIXABAN 5 MG PO TABS
5.0000 mg | ORAL_TABLET | Freq: Two times a day (BID) | ORAL | Status: DC
  Administered 2016-09-20: 5 mg via ORAL
  Filled 2016-09-20: qty 1

## 2016-09-20 MED ORDER — FAMOTIDINE 20 MG PO TABS
20.0000 mg | ORAL_TABLET | Freq: Every day | ORAL | Status: DC
  Administered 2016-09-20: 20 mg via ORAL
  Filled 2016-09-20: qty 1

## 2016-09-20 MED ORDER — DEXAMETHASONE SODIUM PHOSPHATE 4 MG/ML IJ SOLN
4.0000 mg | Freq: Four times a day (QID) | INTRAMUSCULAR | Status: DC
  Administered 2016-09-20 – 2016-09-21 (×6): 4 mg via INTRAVENOUS
  Filled 2016-09-20 (×6): qty 1

## 2016-09-20 MED ORDER — PROCHLORPERAZINE MALEATE 10 MG PO TABS: 10.0000 mg | ORAL_TABLET | Freq: Four times a day (QID) | ORAL | Status: DC | PRN

## 2016-09-20 MED ORDER — ACETAMINOPHEN 325 MG PO TABS: 650.0000 mg | ORAL_TABLET | Freq: Four times a day (QID) | ORAL | Status: DC | PRN

## 2016-09-20 MED ORDER — SENNOSIDES-DOCUSATE SODIUM 8.6-50 MG PO TABS
1.0000 | ORAL_TABLET | Freq: Two times a day (BID) | ORAL | Status: DC
  Administered 2016-09-20: 1 via ORAL
  Filled 2016-09-20: qty 1

## 2016-09-20 MED ORDER — ENSURE ENLIVE PO LIQD: 237.0000 mL | Freq: Two times a day (BID) | ORAL | Status: DC

## 2016-09-20 MED ORDER — LACTATED RINGERS IV SOLN
INTRAVENOUS | Status: DC
  Administered 2016-09-20 (×2): via INTRAVENOUS
  Administered 2016-09-20: 1000 mL via INTRAVENOUS

## 2016-09-20 NOTE — Consult Note (Signed)
Consultation Note Date: 09/20/2016   Patient Name: Omar Peters  DOB: 07-30-1935  MRN: [id number]  Age / Sex: 81 y.o., male  PCP: Tamsen Roers, MD Referring Physician: Eugenie Filler, MD  Reason for Consultation: Establishing goals of care  HPI/Patient Profile: 81 y.o. male   admitted on 10/08/2016    Life limiting illness: lung cancer, spine mets.   Clinical Assessment and Goals of Care:      Omar Peters is a 81 y.o. male with medical history significant for HTN, GERD, macular degeneration, DM, and stage IV lung cancer (non-small cell) presenting with metastatic disease to the spine and inability to walk or stand that came on suddenly in the past 24-48 hours.  His wife Omar Peters is the primary historian, patient is currently restless and confused.      She reports that the patient was diagnosed 12/31/14 with cancer (non-small cell lung CA).  Had chemotherapy.  During that time, he developed pleural fluid and had a pleural catheter placed and wife had to drain it for about 3 months.  Was able to discontinue Pleurx.  Went on Nivolumab immunotherapy clinical trial with improvement in cancer in lung, hip and shoulder.  Had a blood clot about 3 weeks ago in right leg, with PE.  Lung started filling up with fluid, then it was drained and they found cancer cells in pleural fluid.  Was fairly independent until recently.  He was weak and didn't feel like doing much.  Then he developed further mets to the spine within the last few weeks - decided to do radiation therapy to T11-12 (mostly 12).  Sunday night October 1, he went to bed able to walk.  Monday AM October 2, he was unable to stand or walk.  It got worse throughout the day yesterday.  Saw [name] on 09/28/2016 and had radiation therapy started on an emergent basis. Patient has recently not been able to get out of his car, has fallen off his couch and recliner at  home. Patient came for radiation on 09/27/2016 and was sent to the ED for admission.   Patient is restless and confused, he keeps asking for his wife Omar Peters who is present at the bedside. A palliative consult has been placed for goals of care discussions. I introduced myself and palliative care as follows: Palliative medicine is specialized medical care for people living with serious illness. It focuses on providing relief from the symptoms and stress of a serious illness. The goal is to improve quality of life for both the patient and the family.  Patient's wife is tearful and states that she is over whelmed with the patient's sudden rapid decline. Discussed about recent imaging: MRI with lumbosacral and RIGHT iliac osseous metastasis including hemorrhagic metastasis RIGHT gluteal soft tissues. CT head with chronic atrophy and no acute intra cranial abnormalities.   Patient is restless and keeps on moaning, " Oh Lordy, Oh me." brief life review performed, patient and his wife have been together for 35 years, patient  has 4 sons, 2 live locally, one lives in [place] and one lives in [place]. Wife does not want to make decisions or even undertake discussions without the patient's sons present, see below. Thank you for the consult.   NEXT OF KIN  wife of 34 years Omar Peters at [address] [place] current pain and non pain symptom management measures Await onc and rad onc recommendations in light of recent imaging Wife trying to coordinate family meeting later this week with patient's 4 children, 2 sons live locally 2 live out of town for a comprehensive goals of care discussions family meeting. She has my card and knows to call us back with date/time.  It may be appropriate to discuss comfort directed care and addition of hospice as an extra layer of support.   Code Status/Advance Care Planning:  Limited code    Symptom Management:    as above   Palliative  Prophylaxis:   Bowel Regimen   Psycho-social/Spiritual:   Desire for further Chaplaincy support:no  Additional Recommendations: Caregiving  Support/Resources  Prognosis:   Guarded, given progressive cancer and recent ongoing decline.   Discharge Planning: To Be Determined      Primary Diagnoses: Present on Admission: . Metastatic cancer to spine ([place]) . Cancer of upper lobe of right lung ([place])   I have reviewed the medical record, interviewed the patient and family, and examined the patient. The following aspects are pertinent.  Past Medical History:  Diagnosis Date  . Anxiety    due to diagnosis of Stage 4 Lung Cancer  . Bone cancer ([place])    non small cell lung with mets to bone  . Diabetes mellitus without complication ([place])   . GERD (gastroesophageal reflux disease)   . Hypertension   . Lung cancer ([place])    stage IV non small cell lung cancer dx. January 2016   . Macular degeneration of right eye   . Shortness of breath dyspnea    01/14/15 at night and has to sit up   Social History   Social History  . Marital status: Married    Spouse name: N/A  . Number of children: N/A  . Years of education: N/A   Occupational History  . retired    Social History Main Topics  . Smoking status: Former Smoker    Packs/day: 1.00    Years: 50.00    Quit date: 12/14/2014  . Smokeless tobacco: Current User     Comment: restarted but quit when he was stared on oxygen for PE  . Alcohol use 1.2 oz/week    2 Cans of beer per week     Comment: 2-3  beers day  . Drug use: No  . Sexual activity: No   Other Topics Concern  . None   Social History Narrative  . None   Family History  Problem Relation Age of Onset  . Stroke Mother   . Cancer Father     pancreatic  . Cancer Brother     lung  . Cancer Brother     lung   Scheduled Meds: . amLODipine  10 mg Oral Daily  . apixaban  5 mg Oral BID  . dexamethasone  4 mg Oral Q6H  . famotidine  20 mg Oral Daily  .  feeding supplement (ENSURE ENLIVE)  237 mL Oral BID BM  . mouth rinse  15 mL Mouth Rinse BID  . mirtazapine  30 mg Oral  QHS  . senna-docusate  1 tablet Oral BID   Continuous Infusions: . lactated ringers 100 mL/hr at 09/20/16 0245   PRN Meds:.acetaminophen **OR** acetaminophen, LORazepam, magnesium hydroxide, morphine injection, ondansetron **OR** ondansetron (ZOFRAN) IV, prochlorperazine Medications Prior to Admission:  Prior to Admission medications   Medication Sig Start Date End Date Taking? Authorizing Provider  amLODipine (NORVASC) 10 MG tablet Take 10 mg by mouth every morning.    Yes Historical Provider, MD  apixaban (ELIQUIS) 5 MG TABS tablet Take 2 tablets (10 mg total) by mouth 2 (two) times daily. 08/27/16  Yes Debbe Odea, MD  B Complex-Biotin-FA (B-COMPLEX PO) Take 1 each by mouth every morning. Reported on 03/01/2016   Yes Historical Provider, MD  dexamethasone (DECADRON) 4 MG tablet Take 1 tablet (4 mg total) by mouth 3 (three) times daily. Patient taking differently: Take 4 mg by mouth 3 (three) times daily. Decadron was increased from tid to qid on 09/18/16. 09/15/16  Yes Curt Bears, MD  feeding supplement, ENSURE ENLIVE, (ENSURE ENLIVE) LIQD Take 237 mLs by mouth 2 (two) times daily between meals. 08/27/16  Yes Debbe Odea, MD  HYDROcodone-acetaminophen (NORCO) 5-325 MG per tablet Take 1 tablet by mouth every 6 (six) hours as needed for moderate pain. 01/15/15  Yes Grace Isaac, MD  lidocaine-prilocaine (EMLA) cream Apply 1 application topically as needed. Pt is to apply cream 1 hour before receiving chemo 06/21/16  Yes Curt Bears, MD  magnesium hydroxide (MILK OF MAGNESIA) 400 MG/5ML suspension Take 30 mLs by mouth daily as needed for mild constipation.   Yes Historical Provider, MD  metFORMIN (GLUCOPHAGE) 500 MG tablet Take 500 mg by mouth daily with breakfast.  12/17/15  Yes Historical Provider, MD  mirtazapine (REMERON) 30 MG tablet Take 1 tablet by mouth at   bedtime Patient taking differently: Take 30 mg by mouth at  bedtime 08/26/16  Yes Curt Bears, MD  [place] 1 each into the right eye every 8 (eight) weeks. Eye injection.   Yes Historical Provider, MD  prochlorperazine (COMPAZINE) 10 MG tablet Take 1 tablet (10 mg total) by mouth every 6 (six) hours as needed for nausea or vomiting. 05/16/16  Yes Curt Bears, MD  ranitidine (ZANTAC) 150 MG tablet Take 150 mg by mouth every evening.    Yes Historical Provider, MD  senna-docusate (SENNA S) 8.6-50 MG per tablet Take 1 tablet by mouth 2 (two) times daily.    Yes Historical Provider, MD  traMADol (ULTRAM) 50 MG tablet Take 1 tablet (50 mg total) by mouth every 6 (six) hours as needed. Patient taking differently: Take 50 mg by mouth every 6 (six) hours as needed for moderate pain.  08/16/16  Yes Curt Bears, MD   Allergies  Allergen Reactions  . Marinol [Dronabinol] Other (See Comments)    Confusion He does not take this any more   Review of Systems + for restlessness and confusion.  + for generalized and back pain.   Physical Exam Weak elderly gentleman resting in bed, restless S1 S2 Shallow clear breathing Abdomen soft Trace edema Redness in face Patient is awake, some what alert. Is able to follow some commands.   Vital Signs: BP 99/75 (BP Location: Left Arm)   Pulse 84   Temp 97 F (36.1 C) (Axillary)   Resp 15   Ht '5\' 6"'$  (1.676 m)   Wt 60.6 kg (133 lb 8 oz)   SpO2 97%   BMI 21.55 kg/m  Pain Assessment: PAINAD  Pain Score: Asleep   SpO2: SpO2: 97 % O2 Device:SpO2: 97 % O2 Flow Rate: .O2 Flow Rate (L/min): 4 L/min  IO: Intake/output summary:  Intake/Output Summary (Last 24 hours) at 09/20/16 1010 Last data filed at 09/20/16 0308  Gross per 24 hour  Intake                0 ml  Output              150 ml  Net             -150 ml    LBM:   Baseline Weight: Weight: 61.2 kg (135 lb) Most recent weight: Weight: 60.6 kg (133 lb 8 oz)       Palliative Assessment/Data:   Flowsheet Rows   Flowsheet Row Most Recent Value  Intake Tab  Referral Department  Hospitalist  Unit at Time of Referral  Oncology Unit  Palliative Care Primary Diagnosis  Cancer  Palliative Care Type  New Palliative care  Reason for referral  Pain, Non-pain Symptom, Clarify Goals of Care  Date first seen by Palliative Care  09/20/16  Clinical Assessment  Palliative Performance Scale Score  30%  Pain Max last 24 hours  7  Pain Min Last 24 hours  6  Dyspnea Max Last 24 Hours  5  Dyspnea Min Last 24 hours  4  Nausea Max Last 24 Hours  4  Nausea Min Last 24 Hours  3  Psychosocial & Spiritual Assessment  Palliative Care Outcomes  Patient/Family meeting held?  Yes  Who was at the meeting?  patient wife   Palliative Care Outcomes  Clarified goals of care  Palliative Care follow-up planned  Yes, Facility      Time In:  8 am  Time Out:  9.10 Time Total:  70 min  Greater than 50%  of this time was spent counseling and coordinating care related to the above assessment and plan.  Signed by: Loistine Chance, MD  [phone]  Please contact Palliative Medicine Team phone at [phone] for questions and concerns.  For individual provider: See Shea Evans

## 2016-09-20 NOTE — Progress Notes (Addendum)
The patient was seen briefly. In summary, he was started on Dexamethasone 4 mg TID on Saturday last week after he started having symptoms concerning for cord compression from a tumor in the T11-T12 region. He was encouraged to proceed with MRI of the thoracic spine which did not reveal edema or cord compression, but he did go on to receive radiation to try to shrink his tumor and protect the cord on 09/18/16, his treatment schedule includes a total of 10 fractions.He was also encouraged to increase his dexamethasone to 4 times a day. His wife called and explained that he was unable to stand this morning, and he came for radiotherapy. [name] reviewed the patient's MRI with Dr. Cyndy Freeze and due to his medical comorbidities, lung function with O2 dependance, as well as full anticoagulation, that he is not a candidate for surgical decompression. Given his symptoms, recommendations were made for MRI of the lumbar spine to rule out cauda equina. I spoke with an attending in the ED and discussed this, he will be taken to the ED for evaluation and probable admission to coordinate his outpatient needs. Carola Rhine, PAC

## 2016-09-20 NOTE — Progress Notes (Signed)
The patient's wife and I bonded last night and she asked me to come back by this evening.  Notes from today were reviewed when I arrived tonight.  I went by and many family members were present.  They reported that the patient had been very uncomfortable for about an hour just prior to my arrival and that he was finally comfortable with morphine and ativan.  I addressed the idea of a comfort care order set with a morphine drip after the wife brought it up.  I assured them that we would be willing to change his orders to full comfort care at any time, and provided the nurse with my pager in case they choose to do so during my swing shift.  I also notified NP Baltazar Najjar in case they opt for this overnight.  Assuming they have not made this decision sooner, it seems likely they will be ready to transition to full comfort care by tomorrow.  They understand his extremely limited life expectancy and specifically said that his comfort is their utmost priority.  Carlyon Shadow, M.D.

## 2016-09-20 NOTE — Progress Notes (Signed)
PROGRESS NOTE    COYT GOVONI  KVQ:[id number] DOB: 01-23-1935 DOA: 09/20/2016 PCP: Tamsen Roers, MD    Brief Narrative:   Omar Peters is a 81 y.o. male with medical history significant of HTN, GERD, macular degeneration, DM, and stage IV lung cancer (non-small cell) presenting with metastatic disease to the spine and inability to walk or stand.  His wife provided the entire history, as the patient had been sedated and was comfortably sleeping for the entire duration of the encounter.  She reports that the patient was diagnosed 12/31/14 with cancer (non-small cell lung CA).  Had chemotherapy.  During that time, he developed pleural fluid and had a pleural catheter placed and wife had to drain it for about 3 months.  Was able to discontinue Pleurx.  Went on Nivolumab immunotherapy clinical trial with improvement in cancer in lung, hip and shoulder.  Had a blood clot about 3 weeks ago in right leg, with PE.  Lung started filling up with fluid, then it was drained and they found cancer cells in pleural fluid.  Was fairly independent until recently.  He was weak and didn't feel like doing much.  Then he developed further mets to the spine within the last few weeks - decided to do radiation therapy to T11-12 (mostly 12).  Sunday night, he went to bed able to walk.  Monday AM, he was unable to stand or walk.  It got worse throughout the day yesterday.  Saw [name] yesterday and had radiation therapy both yesterday and today. Last night, wife was unable to get him out of the car, had to car a neighbor for assistance.  He slept on the couch, fell off overnight and once again wife couldn't get him up.  She called her niece and husband about 11pm and they came and helped him into a recliner, wife slept on couch.  He was still unable to get out of recliner, used the urinal. His feet just dangled and dragged.  He slipped out of recliner and fell again and wife couldn't get him up; called neighbor again and  they got him back up again. He is confused and calls her without making sense - she thinks he is dehydrated but he has not had a head CT.  He also won't let her out of his sight, which is very unusual for him.  He needed to have a BM so his son and a friend came to help him to the bathroom and he did have a good BM; they helped him to the car.  Came over for radiation and then went for MRI today.  Was sent from there to the ER for admission.   Assessment & Plan:   Principal Problem:   Metastatic cancer to spine [facility]) Active Problems:   Cancer of upper lobe of right lung ([place])   Diabetes mellitus ([place])   Long term current use of anticoagulant therapy   Confusion   Type 2 diabetes mellitus with hemoglobin A1c goal of 7.0%-8.0% ([place])    #1 metastatic stage IV non-small cell lung cancer diagnosed general 2016 Status post multiple treatments including chemotherapy, radiation and immunotherapy. Patient's condition has declined significantly secondary to disease progression and worsening metastases in the thoracolumbar spine leading to inability to ambulate. Patient significantly weak with some confusion and minimally responsive at this time. CT head is negative for metastatic disease of the brain. Oncology has assessed the patient and also recommending palliative care. Palliative care consultation has been obtained.  Likely will have a family meeting in the next couple of days. Will continue supportive care, pain management. Change oral Decadron to IV Decadron. Per oncology no need to continue palliative radiation at this time on anticoagulation. Will hold off on any invasive procedures at this time. Gentle hydration.  #2 diabetes mellitus Stable. Monitor CBGs.  #3 recent pulmonary emboli on anticoagulation Per oncology no need to continue eliquis at this point in time and as such will discontinue.  DVT prophylaxis: On Eliquis.  Code Status: Partial: Family Communication: Updated wife and friend  at bedside. Disposition Plan: Pending palliative care meeting/consultation. Probably residential hospice.   Consultants:   Oncology: Dr. Lorna Few 09/20/2016  Palliative care: Dr. Rowe Pavy 09/20/2016  Procedures:   CT Head 09/20/2016  Chest x-ray 09/20/2016  MRI L-spine 10/11/2016  MRI T-spine 09/18/2016  Antimicrobials:   None   Subjective: Patient minimally arousable with verbal stimuli. Patient resting comfortably. Wife at bedside.  Objective: Vitals:   10/15/2016 2341 09/20/16 0011 09/20/16 0125 09/20/16 0608  BP: 102/58 103/57 129/67 99/75  Pulse: 80 82 89 84  Resp: '14 14 16 15  '$ Temp:   (!) 96.6 F (35.9 C) 97 F (36.1 C)  TempSrc:   Axillary Axillary  SpO2: 100% 100% 100% 97%  Weight:   60.6 kg (133 lb 8 oz)   Height:   '5\' 6"'$  (1.676 m)     Intake/Output Summary (Last 24 hours) at 09/20/16 1708 Last data filed at 09/20/16 0308  Gross per 24 hour  Intake                0 ml  Output              150 ml  Net             -150 ml   Filed Weights   09/30/2016 2007 09/20/16 0125  Weight: 61.2 kg (135 lb) 60.6 kg (133 lb 8 oz)    Examination:  General exam: Resting comfortably. Respiratory system: Clear to auscultation anterior lung fields.Marland Kitchen Respiratory effort normal. Cardiovascular system: S1 & S2 heard, RRR. No JVD, murmurs, rubs, gallops or clicks. No pedal edema. Gastrointestinal system: Abdomen is nondistended, soft and nontender. No organomegaly or masses felt. Normal bowel sounds heard. Central nervous system: Alert and oriented. No focal neurological deficits. Extremities: Symmetric 5 x 5 power. Skin: No rashes, lesions or ulcers Psychiatry: Judgement and insight are unable to be assessed due to patient's current status.     Data Reviewed: I have personally reviewed following labs and imaging studies  CBC:  Recent Labs Lab 10/02/2016 1744 09/20/16 0550  WBC 13.5* 8.0  NEUTROABS 12.1*  --   HGB 13.4 11.3*  HCT 39.6 34.1*  MCV 97.5 95.5   PLT 114* 74*   Basic Metabolic Panel:  Recent Labs Lab 10/15/2016 1744 09/20/16 0550  NA 139 141  K 3.9 4.1  CL 106 108  CO2 27 29  GLUCOSE 136* 121*  BUN 17 21*  CREATININE 0.63 0.60*  CALCIUM 8.4* 8.0*   GFR: Estimated Creatinine Clearance: 65.2 mL/min (by C-G formula based on SCr of 0.6 mg/dL (L)). Liver Function Tests: No results for input(s): AST, ALT, ALKPHOS, BILITOT, PROT, ALBUMIN in the last 168 hours. No results for input(s): LIPASE, AMYLASE in the last 168 hours. No results for input(s): AMMONIA in the last 168 hours. Coagulation Profile: No results for input(s): INR, PROTIME in the last 168 hours. Cardiac Enzymes: No results for input(s): CKTOTAL, CKMB, CKMBINDEX,  TROPONINI in the last 168 hours. BNP (last 3 results) No results for input(s): PROBNP in the last 8760 hours. HbA1C: No results for input(s): HGBA1C in the last 72 hours. CBG: No results for input(s): GLUCAP in the last 168 hours. Lipid Profile: No results for input(s): CHOL, HDL, LDLCALC, TRIG, CHOLHDL, LDLDIRECT in the last 72 hours. Thyroid Function Tests: No results for input(s): TSH, T4TOTAL, FREET4, T3FREE, THYROIDAB in the last 72 hours. Anemia Panel: No results for input(s): VITAMINB12, FOLATE, FERRITIN, TIBC, IRON, RETICCTPCT in the last 72 hours. Sepsis Labs: No results for input(s): PROCALCITON, LATICACIDVEN in the last 168 hours.  No results found for this or any previous visit (from the past 240 hour(s)).       Radiology Studies: Dg Chest 1 View  Result Date: 09/20/2016 CLINICAL DATA:  Unresponsive. Confusion. History of metastatic lung cancer. EXAM: CHEST 1 VIEW COMPARISON:  Chest 09/08/2016.  CT chest 09/06/2016. FINDINGS: Right-sided power port type central venous catheter with tip over the low SVC region. Volume loss throughout the right lung with pleural fluid or thickening along the right lateral and apical chest wall. This may represent collapse and consolidation or  postradiation changes. Compensatory hyperinflation of the left lung. Small left pleural effusion. Can't exclude infiltrate in the left lung base. No pneumothorax. Heart size appears normal. Heterogeneous appearance of the right glenoid could indicate a bone metastasis. IMPRESSION: Volume loss, infiltration, and fluid or pleural thickening on the right similar prior study. This may be due to collapse and consolidation or postradiation changes. Small left pleural effusion. Possible left basilar infiltration. Electronically Signed   By: [name] M.D.   On: 09/20/2016 02:28   Ct Head Wo Contrast  Result Date: 09/20/2016 CLINICAL DATA:  Confusion. Lung cancer with known spinal metastasis. EXAM: CT HEAD WITHOUT CONTRAST TECHNIQUE: Contiguous axial images were obtained from the base of the skull through the vertex without intravenous contrast. COMPARISON:  MRI brain 12/01/2015.  CT head 12/01/2015. FINDINGS: Brain: Diffuse cerebral atrophy. Ventricular dilatation consistent with central atrophy. Low-attenuation changes in the deep white matter consistent small vessel ischemia. No mass-effect or midline shift. No evidence of acute infarction, hemorrhage, hydrocephalus, extra-axial collection or mass lesion/mass effect. Vascular: Atherosclerotic vascular calcifications are present. Skull: Normal. Negative for fracture or focal lesion. Sinuses/Orbits: No acute finding. Other: No significant changes since prior study. IMPRESSION: No acute intracranial abnormalities. Chronic atrophy and small vessel ischemic changes. Electronically Signed   By: [name] M.D.   On: 09/20/2016 02:34   Mr Thoracic Spine W Wo Contrast  Result Date: 09/18/2016 CLINICAL DATA:  Weakness in legs. Bowel and bladder changes. History of lung cancer. EXAM: MRI THORACIC SPINE WITHOUT AND WITH CONTRAST TECHNIQUE: Multiplanar and multiecho pulse sequences of the thoracic spine were obtained without and with intravenous contrast.  CONTRAST:  30m MULTIHANCE GADOBENATE DIMEGLUMINE 529 MG/ML IV SOLN COMPARISON:  Chest CT 09/06/2016 FINDINGS: There is normal alignment without static subluxation. There are numerous low T1 weighted signal lesions throughout the thoracic spine vertebral bodies. There is complete replacement of the normal marrow signal of the T1 and T11 vertebral bodies. There is extensive involvement of the left posterior elements at T6, T11 and T12 with associated encroachment on the left T5-6, T6-7, T11-12 and T12-L1 neural foramina. There is epidural extension of disease along the left posterior aspect of the spinal canal at the T12 level, extending into the neural foramen. There is a cystic area adjacent to the left T12 lamina. There is no spinal  canal stenosis. No degenerative neural foraminal stenosis. No acute compression fracture or evidence of discitis osteomyelitis. There is a large left pleural effusion and a loculated right pleural effusion. There is incompletely visualized consolidative opacity of the right upper lobe. IMPRESSION: 1. Widespread osseous metastatic disease of the thoracic spine with left dorsal epidural extension of disease at the T12 level. 2. Tumor expansion of the posterior elements at T6, T11 and T12 with associated encroachment on the left T5-6, T6-7, T11-12 and T12-L1 neural foramina. Given differences in modalities, it is difficult to determine the degree to which the epidural disease is new/worsened since 09/06/2016. 3. Large bilateral effusions with prominent loculated component on the right. 4. Incompletely evaluated consolidative opacity of the right upper lobe at the site of known tumor. Electronically Signed   By: Ulyses Jarred M.D.   On: 09/18/2016 20:31   Mr Lumbar Spine W Wo Contrast  Result Date: 10/16/2016 CLINICAL DATA:  Unable to walk for 3 days. History of metastatic lung cancer, recent MRI demonstrating new metastasis. EXAM: MRI LUMBAR SPINE WITHOUT AND WITH CONTRAST TECHNIQUE:  Multiplanar and multiecho pulse sequences of the lumbar spine were obtained without and with intravenous contrast. CONTRAST:  51m MULTIHANCE GADOBENATE DIMEGLUMINE 529 MG/ML IV SOLN COMPARISON:  MRI thoracic spine September 18, 2016 and CT chest, abdomen and pelvis September 06, 2016 FINDINGS: Multiple sequences are moderately or severely motion degraded, axial T1 post gadolinium sequences nearly nondiagnostic. SEGMENTATION: For the purposes of this report, the last well-formed intervertebral disc will be described as L5-S1. ALIGNMENT: No malalignment.  Maintenance of the lumbar lordosis. VERTEBRAE:Lumbar vertebral bodies are intact. Low T1, T2 bright enhancing metastasis in all lumbar vertebral bodies except L4. Expansile LEFT is sacral metastasis. Expansile RIGHT iliac metastasis with cystic, hemorrhagic invasion of the RIGHT gluteus muscles, consistent with prior CT results. No lumbosacral pathologic fracture. Mild L4-5 disc height loss with decreased T2 signal within all disc compatible with mild desiccation. Multilevel mild chronic discogenic endplate changes. Included thoracic spine demonstrates expansile metastasis T12 better characterized on recent dedicated study. CONUS MEDULLARIS: Conus medullaris terminates at L1-2 and demonstrates normal morphology and signal characteristics. Cauda equina is normal. No definite abnormal cord, leptomeningeal or epidural enhancement though motion degraded post gadolinium sequences. PARASPINAL AND SOFT TISSUES: Small amount of perihepatic ascites, increased from priors examination. Mild RIGHT iliopsoas and RIGHT paraspinal muscle denervation/interstitial edema. DISC LEVELS: L1-2: Small broad-based RIGHT central disc protrusion. Mild facet arthropathy and ligamentum flavum redundancy. No canal stenosis or neural foraminal narrowing. L2-3: Annular bulging. Mild facet arthropathy and ligamentum flavum redundancy without canal stenosis. Minimal LEFT neural foraminal narrowing.  L3-4: Annular bulging. Mild facet arthropathy and ligamentum flavum redundancy without canal stenosis. Minimal bilateral neural foraminal narrowing. L4-5: Small broad-based disc bulge asymmetric to the RIGHT could affect the exited RIGHT L4 nerve. Mild facet arthropathy and ligamentum flavum redundancy. Mild canal stenosis including partial effacement lateral recesses which could affect the traversing L5 nerves. Mild RIGHT, minimal LEFT neural foraminal narrowing. L5-S1: Small broad-based central disc protrusion. Mild facet arthropathy on the RIGHT. No canal stenosis. Moderate RIGHT, mild LEFT neural foraminal narrowing. IMPRESSION: Motion degraded examination. Lumbosacral and RIGHT iliac osseous metastasis including hemorrhagic metastasis RIGHT gluteal soft tissues. No spinal pathologic fracture. Degenerative change of lumbar spine results in mild canal stenosis L4-5. Neural foraminal narrowing L2-3 through L5-S1: Moderate on the RIGHT at L5-S1. Increasing perihepatic ascites. Electronically Signed   By: CElon AlasM.D.   On: 10/15/2016 22:48  Scheduled Meds: . amLODipine  10 mg Oral Daily  . apixaban  5 mg Oral BID  . dexamethasone  4 mg Intravenous Q6H  . famotidine  20 mg Oral Daily  . mouth rinse  15 mL Mouth Rinse BID  . mirtazapine  30 mg Oral QHS  . senna-docusate  1 tablet Oral BID   Continuous Infusions: . sodium chloride 75 mL/hr at 09/20/16 1400  . lactated ringers 100 mL/hr at 09/20/16 1256     LOS: 1 day    Time spent: 28 mins    Jahna Liebert, MD [facility] Pager [phone]  If 7PM-7AM, please contact night-coverage [email or web address] Password [email or web address] 09/20/2016, 5:08 PM

## 2016-09-20 NOTE — Progress Notes (Signed)
CHART NOTE Mr. Demma was seen today. He was sleeping comfortably in his bed. His wife and niece were at the bedside. This is a very pleasant 81 years old white male who was diagnosed with a stage IV non-small cell lung cancer in January 2016 and underwent several regimens of treatment including chemotherapy, radiation and immunotherapy. Unfortunately his condition declined significantly recently with evidence for disease progression and worsening metastasis in the thoracolumbar spines. The patient was expected to start palliative radiotherapy but was unable to tolerate it. He was admitted with significant weakness and fatigue and inability to walk. I had a lengthy discussion with the wife and niece today about his current condition and treatment options. I strongly recommended for them to consider palliative and comfort care at this point. They are in agreement with the plan and they mentioned that the patient was ready for it. I don't see any need to continue with palliative radiation or Eliquis at this point. His life expectancy is very short and he may benefit from discharge to hospice facility if he does not pass away during this admission. The wife was very appreciative of the care he received during his course of treatment. Thank you for taking good care of Mr. Contino. Please call if you have any questions.

## 2016-09-20 NOTE — ED Notes (Signed)
Patient transported to CT 

## 2016-09-20 NOTE — Telephone Encounter (Signed)
Called 3W spoke with RN Josph Macho, asked if pateint could come down for rad tx,per Shona Simpson, pa , he needs to have treatment, Josph Macho stated paient could copme down , wasn't in any pain, has to come via stretcher and has IVF'S infusing, thanked Therapist, sports, spokew with Joellen Jersey and Anderson Malta Rt therapist

## 2016-09-20 NOTE — Progress Notes (Signed)
Initial Nutrition Assessment  DOCUMENTATION CODES:   Non-severe (moderate) malnutrition in context of chronic illness  INTERVENTION:    Will continue to monitor for nutrition-related needs.  Discontinue order for Ensure Enlive oral nutrition supplement BID per request of pt's wife.  NUTRITION DIAGNOSIS:   Malnutrition related to chronic illness as evidenced by moderate depletion of body fat, moderate depletions of muscle mass, energy intake < 75% for > or equal to 1 month.  GOAL:   Patient will meet greater than or equal to 90% of their needs  MONITOR:   PO intake, Supplement acceptance, I & O's, Labs, Weight trends, GOC  REASON FOR ASSESSMENT:   Malnutrition Screening Tool    ASSESSMENT:   81 y.o. male with medical history significant of HTN, GERD, macular degeneration, DM, and stage IV lung cancer (non-small cell) presenting with metastatic disease to the spine and inability to walk or stand.  Spoke with pt's wife and friend at pt's bedside. Pt sleeping at time of visit. Wife reports pt has had no appetite for "months" PTA. Wife reports having to force-feed pt at mealtimes and that pt would not have eaten had she not done this. Per wife, pt would consume a light breakfast, light lunch, and more substantial dinner PTA. Pt's wife reports he consumed some soup yesterday PM.  Pt's wife reports pt's weight has fluctuated greatly over time. Pt's wife reports he lost 26# at one point but gained some weight back after eating "sausage and gravy." Wife reports pt's UBW as 157#.  Pt's wife states pt consumed Premier Protein oral nutrition supplement with ice cream daily PTA. She would not prefer to have oral nutrition supplements provided to the pt at this time as she does not plan on continuing to force-feed him.  Pt's wife and friend are teary-eyed as they explain they knew pt would not get well but did not realize his decline would be so rapid. Pt's wife reports he has not voided  since 0300 today.  Medications reviewed and include Pepcid tablet 20 mg daily, Senokot tablet BID, continuous lactated ringers infusion @ 100 mL/hr, magnesium hydroxide 30 mL PRN, PRN Zofran  Labs reviewed and include elevated BUN (21 mg/dL), low calcium (0.60 mg/dL), low calcium (8.0 mg/dL)  NFPE: Partial exam completed in respect to pt's comfort. Mild/moderate fat depletion, mild/moderate muscle depletion, and unable to assess edema.  Diet Order:  Diet regular Room service appropriate? Yes; Fluid consistency: Thin  Skin:  Reviewed, no issues  Last BM:  PTA  Height:   Ht Readings from Last 1 Encounters:  09/20/16 '5\' 6"'$  (1.676 m)    Weight:   Wt Readings from Last 1 Encounters:  09/20/16 133 lb 8 oz (60.6 kg)    Ideal Body Weight:  64.5 kg  BMI:  Body mass index is 21.55 kg/m.  Estimated Nutritional Needs:   Kcal:  1300-1500 (22-25 kcal/kg)  Protein:  73-85 grams (1.2-1.4 g/kg)  Fluid:  1.3-1.5 L/day  EDUCATION NEEDS:   No education needs identified at this time  Jeb Levering Dietetic Intern Pager Number: [phone]

## 2016-09-20 NOTE — Progress Notes (Signed)
I spoke with the patient's wife by phone today. Fortunately the patient's MRI of the lumbar spine was negative for cord compression. He has been more lethargic, and somewhat confused per the notes from other providers. When our staff tried to bring him down for radiation treatment, he refused. The patient has met with palliative care and is going to have a family meeting in the next few days to discuss goals of care. As the patient has intact spinal cord at this time, we would not push him toward treatment and will hold off today as well as tomorrow, until they can meet with palliative for a goals of care meeting. The patient's wife is encouraged to call if the plan is to change, and I will follow up as well in the next few days.      Carola Rhine, PAC

## 2016-09-21 ENCOUNTER — Ambulatory Visit: Payer: Medicare Other

## 2016-09-21 LAB — CBC
HCT: 37.9 % — ABNORMAL LOW (ref 39.0–52.0)
Hemoglobin: 12.5 g/dL — ABNORMAL LOW (ref 13.0–17.0)
MCH: 33 pg (ref 26.0–34.0)
MCHC: 33 g/dL (ref 30.0–36.0)
MCV: 100 fL (ref 78.0–100.0)
PLATELETS: 62 10*3/uL — AB (ref 150–400)
RBC: 3.79 MIL/uL — AB (ref 4.22–5.81)
RDW: 14.1 % (ref 11.5–15.5)
WBC: 9.3 10*3/uL (ref 4.0–10.5)

## 2016-09-21 LAB — BASIC METABOLIC PANEL
ANION GAP: 3 — AB (ref 5–15)
BUN: 26 mg/dL — AB (ref 6–20)
CO2: 28 mmol/L (ref 22–32)
Calcium: 8.1 mg/dL — ABNORMAL LOW (ref 8.9–10.3)
Chloride: 110 mmol/L (ref 101–111)
Creatinine, Ser: 0.66 mg/dL (ref 0.61–1.24)
GFR calc Af Amer: >60 mL/min (ref >60)
Glucose, Bld: 121 mg/dL — ABNORMAL HIGH (ref 65–99)
POTASSIUM: 4.4 mmol/L (ref 3.5–5.1)
SODIUM: 141 mmol/L (ref 135–145)

## 2016-09-21 MED ORDER — GLYCOPYRROLATE 0.2 MG/ML IJ SOLN
0.2000 mg | INTRAMUSCULAR | Status: DC
  Filled 2016-09-21: qty 1

## 2016-09-21 MED ORDER — GLYCOPYRROLATE 0.2 MG/ML IJ SOLN
0.1000 mg | Freq: Once | INTRAMUSCULAR | Status: DC
  Filled 2016-09-21: qty 0.5

## 2016-09-21 MED ORDER — ATROPINE SULFATE 1 % OP SOLN
4.0000 [drp] | OPHTHALMIC | Status: DC | PRN
  Administered 2016-09-21 (×2): 4 [drp] via SUBLINGUAL
  Filled 2016-09-21: qty 2

## 2016-09-21 MED ORDER — OXYBUTYNIN CHLORIDE 5 MG PO TABS: 2.5000 mg | ORAL_TABLET | Freq: Four times a day (QID) | ORAL | Status: DC | PRN

## 2016-09-21 MED ORDER — ACETAMINOPHEN 325 MG PO TABS: 650.0000 mg | ORAL_TABLET | Freq: Four times a day (QID) | ORAL | Status: DC | PRN

## 2016-09-21 MED ORDER — GLYCOPYRROLATE 0.2 MG/ML IJ SOLN
0.2000 mg | INTRAMUSCULAR | Status: DC | PRN
  Administered 2016-09-21: 0.2 mg via INTRAVENOUS
  Filled 2016-09-21 (×2): qty 1

## 2016-09-21 MED ORDER — DIPHENHYDRAMINE HCL 50 MG/ML IJ SOLN: 12.5000 mg | INTRAMUSCULAR | Status: DC | PRN

## 2016-09-21 MED ORDER — ACETAMINOPHEN 650 MG RE SUPP: 650.0000 mg | Freq: Four times a day (QID) | RECTAL | Status: DC | PRN

## 2016-09-21 MED ORDER — RISPERIDONE 0.5 MG PO TBDP
0.5000 mg | ORAL_TABLET | Freq: Two times a day (BID) | ORAL | Status: DC
  Filled 2016-09-21 (×2): qty 1

## 2016-09-21 MED ORDER — MORPHINE BOLUS VIA INFUSION
1.0000 mg | INTRAVENOUS | Status: DC | PRN
  Administered 2016-09-21 (×2): 1 mg via INTRAVENOUS
  Filled 2016-09-21: qty 1

## 2016-09-21 MED ORDER — GLYCOPYRROLATE 1 MG PO TABS
1.0000 mg | ORAL_TABLET | ORAL | Status: DC | PRN
  Filled 2016-09-21: qty 1

## 2016-09-21 MED ORDER — GLYCOPYRROLATE 0.2 MG/ML IJ SOLN
0.2000 mg | INTRAMUSCULAR | Status: DC | PRN
  Filled 2016-09-21: qty 1

## 2016-09-21 MED ORDER — NYSTATIN 100000 UNIT/GM EX POWD
Freq: Three times a day (TID) | CUTANEOUS | Status: DC | PRN
  Filled 2016-09-21: qty 15

## 2016-09-21 MED ORDER — POLYVINYL ALCOHOL 1.4 % OP SOLN
1.0000 [drp] | Freq: Four times a day (QID) | OPHTHALMIC | Status: DC | PRN
  Filled 2016-09-21: qty 15

## 2016-09-21 MED ORDER — SODIUM CHLORIDE 0.9 % IV SOLN
3.0000 mg/h | INTRAVENOUS | Status: DC
  Administered 2016-09-21: 2 mg/h via INTRAVENOUS
  Filled 2016-09-21: qty 10

## 2016-09-21 MED ORDER — BISACODYL 10 MG RE SUPP: 10.0000 mg | Freq: Every day | RECTAL | Status: DC | PRN

## 2016-09-21 MED ORDER — SODIUM CHLORIDE 0.9 % IV SOLN
12.5000 mg | Freq: Four times a day (QID) | INTRAVENOUS | Status: DC | PRN
  Filled 2016-09-21: qty 0.5

## 2016-09-21 MED ORDER — BIOTENE DRY MOUTH MT LIQD: 15.0000 mL | OROMUCOSAL | Status: DC | PRN

## 2016-09-21 NOTE — Progress Notes (Signed)
Daily Progress Note   Patient Name: Omar Peters       Date: 09/21/2016 DOB: Mar 08, 1935  Age: 81 y.o. MRN#: [id number] Attending Physician: Eugenie Filler, MD Primary Care Physician: Tamsen Roers, MD Admit Date: 09/23/2016  Reason for Consultation/Follow-up: Establishing goals of care  81 years old white male who was diagnosed with a stage IV non-small cell lung cancer in January 2016 and underwent several regimens of treatment including chemotherapy, radiation and immunotherapy. Unfortunately his condition declined significantly recently with evidence for disease progression and worsening metastasis in the thoracolumbar spine. The patient was expected to start palliative radiotherapy but was unable to tolerate it. He was admitted with significant weakness and fatigue and inability to walk.  Subjective:  moans periodically, unresponsive now  Overnight events noted, appreciate Dr. Lorin Mercy follow up overnight.  See below  Length of Stay: 2  Current Medications: Scheduled Meds:  . dexamethasone  4 mg Intravenous Q6H  . famotidine (PEPCID) IV  20 mg Intravenous Q24H  . mouth rinse  15 mL Mouth Rinse BID  . senna-docusate  1 tablet Oral BID    Continuous Infusions: . sodium chloride 1,000 mL (09/21/16 0732)  . morphine      PRN Meds: acetaminophen **OR** acetaminophen, LORazepam, magnesium hydroxide, morphine injection, morphine **AND** morphine, ondansetron **OR** ondansetron (ZOFRAN) IV, prochlorperazine  Physical Exam         Unresponsive Moans at times Shallow breathing s1 s2 Abdomen soft Trace edema  Vital Signs: BP 126/61 (BP Location: Right Arm)   Pulse 96   Temp 98 F (36.7 C) (Oral)   Resp 14   Ht '5\' 6"'$  (1.676 m)   Wt 60.6 kg (133 lb 8 oz)   SpO2 93%   BMI  21.55 kg/m  SpO2: SpO2: 93 % O2 Device: O2 Device: Nasal Cannula O2 Flow Rate: O2 Flow Rate (L/min): 4 L/min  Intake/output summary:  Intake/Output Summary (Last 24 hours) at 09/21/16 0925 Last data filed at 09/21/16 0829  Gross per 24 hour  Intake                0 ml  Output              750 ml  Net             -750 ml  LBM: Last BM Date:  (unknown,admit on 09/18/16) Baseline Weight: Weight: 61.2 kg (135 lb) Most recent weight: Weight: 60.6 kg (133 lb 8 oz)       Palliative Assessment/Data:    Flowsheet Rows   Flowsheet Row Most Recent Value  Intake Tab  Referral Department  Hospitalist  Unit at Time of Referral  Oncology Unit  Palliative Care Primary Diagnosis  Cancer  Palliative Care Type  Return patient Palliative Care  Reason for referral  Non-pain Symptom, End of Life Care Assistance  Date first seen by Palliative Care  09/20/16  Clinical Assessment  Palliative Performance Scale Score  10%  Pain Max last 24 hours  5  Pain Min Last 24 hours  4  Dyspnea Max Last 24 Hours  4  Dyspnea Min Last 24 hours  3  Nausea Max Last 24 Hours  3  Nausea Min Last 24 Hours  2  Psychosocial & Spiritual Assessment  Palliative Care Outcomes  Patient/Family meeting held?  Yes  Who was at the meeting?  wife at bedside   [place] goals of care  Palliative Care follow-up planned  Yes, Facility      Patient Active Problem List   Diagnosis Date Noted  . Confusion   . Type 2 diabetes mellitus with hemoglobin A1c goal of 7.0%-8.0% ([place])   . Metastatic cancer to spine ([place]) 09/30/2016  . Long term current use of anticoagulant therapy 09/08/2016  . Neck abscess 09/08/2016  . Acute hypoxemic respiratory failure ([place]) 08/26/2016  . Right leg DVT ([place]) 08/26/2016  . Acute pulmonary embolism ([place]) 08/25/2016  . Diabetes mellitus ([place]) 08/25/2016  . Port catheter in place 07/05/2016  . Aphasia 12/01/2015  . Acute encephalopathy 12/01/2015  . Encounter for  antineoplastic immunotherapy 07/07/2015  . Weight loss 04/07/2015  . Orthostatic hypotension 04/07/2015  . Anorexia 03/10/2015  . Nausea with vomiting 03/10/2015  . Constipation 03/10/2015  . Pleural effusion 03/10/2015  . Weakness 03/10/2015  . Hypoalbuminemia 03/10/2015  . Hyperglycemia 03/03/2015  . Hypertension 01/02/2015  . Bone metastasis ([place]) 12/31/2014  . Cancer of upper lobe of right lung ([place]) 12/31/2014  . Cough 12/29/2014    Palliative Care Assessment & Plan   Patient Profile:    Assessment:  lung cancer Wide spread metastatic disease.  Hypoactive delirium, possibly terminal History of recent PE DM  Recommendations/Plan:   Discussed extensively with wife at bedside, patient's daughter arriving from [place] today.   Start morphine drip today  Consider residential hospice if appropriate by September 28, 2016.   Anticipated hospital death, prognosis hours to days discussed with wife.   Code Status:    Code Status Orders        Start     Ordered   09/20/16 0133  Limited resuscitation (code)  Continuous    Question Answer Comment  In the event of cardiac or respiratory ARREST: Initiate Code Blue, Call Rapid Response Yes   In the event of cardiac or respiratory ARREST: Perform CPR Yes   In the event of cardiac or respiratory ARREST: Perform Intubation/Mechanical Ventilation No   In the event of cardiac or respiratory ARREST: Use NIPPV/BiPAp only if indicated Yes   In the event of cardiac or respiratory ARREST: Administer ACLS medications if indicated Yes   In the event of cardiac or respiratory ARREST: Perform Defibrillation or Cardioversion if indicated Yes      09/20/16 0132    Code Status History    Date Active Date Inactive Code  Status Order ID Comments User Context   08/25/2016  4:24 PM 08/27/2016  7:05 PM Full Code [id number]  Caren Griffins, MD ED   12/01/2015 11:29 PM 12/02/2015  8:47 PM Full Code [id number]  Toy Baker, MD Inpatient   01/07/2015  10:03 AM 01/08/2015  3:34 AM Full Code [id number]  Marybelle Killings, MD [facility]    Advance Directive Documentation   Flowsheet Row Most Recent Value  Type of Advance Directive  Living will, Healthcare Power of Attorney  Pre-existing out of facility DNR order (yellow form or pink MOST form)  No data  "MOST" Form in Place?  No data       Prognosis:   Hours - Days  Discharge Planning:  Anticipated Hospital Death  Care plan was discussed with  Wife   Thank you for allowing the Palliative Medicine Team to assist in the care of this patient.   Time In:  8 Time Out: 8.35 Total Time 35  Prolonged Time Billed  no       Greater than 50%  of this time was spent counseling and coordinating care related to the above assessment and plan.  Loistine Chance, MD [phone]  Please contact Palliative Medicine Team phone at [phone] for questions and concerns.

## 2016-09-21 NOTE — Progress Notes (Signed)
Per Palliative MD, plan is for residential hospice by 10/6 if appropriate. Potential for hospital death. CM will continue to follow. Marney Doctor RN,BSN,NCM [phone]

## 2016-09-21 NOTE — Progress Notes (Signed)
PROGRESS NOTE    COYT GOVONI  KVQ:[id number] DOB: 01-23-1935 DOA: 09/20/2016 PCP: Tamsen Roers, MD    Brief Narrative:   Omar Peters is a 81 y.o. male with medical history significant of HTN, GERD, macular degeneration, DM, and stage IV lung cancer (non-small cell) presenting with metastatic disease to the spine and inability to walk or stand.  His wife provided the entire history, as the patient had been sedated and was comfortably sleeping for the entire duration of the encounter.  She reports that the patient was diagnosed 12/31/14 with cancer (non-small cell lung CA).  Had chemotherapy.  During that time, he developed pleural fluid and had a pleural catheter placed and wife had to drain it for about 3 months.  Was able to discontinue Pleurx.  Went on Nivolumab immunotherapy clinical trial with improvement in cancer in lung, hip and shoulder.  Had a blood clot about 3 weeks ago in right leg, with PE.  Lung started filling up with fluid, then it was drained and they found cancer cells in pleural fluid.  Was fairly independent until recently.  He was weak and didn't feel like doing much.  Then he developed further mets to the spine within the last few weeks - decided to do radiation therapy to T11-12 (mostly 12).  Sunday night, he went to bed able to walk.  Monday AM, he was unable to stand or walk.  It got worse throughout the day yesterday.  Saw [name] yesterday and had radiation therapy both yesterday and today. Last night, wife was unable to get him out of the car, had to car a neighbor for assistance.  He slept on the couch, fell off overnight and once again wife couldn't get him up.  She called her niece and husband about 11pm and they came and helped him into a recliner, wife slept on couch.  He was still unable to get out of recliner, used the urinal. His feet just dangled and dragged.  He slipped out of recliner and fell again and wife couldn't get him up; called neighbor again and  they got him back up again. He is confused and calls her without making sense - she thinks he is dehydrated but he has not had a head CT.  He also won't let her out of his sight, which is very unusual for him.  He needed to have a BM so his son and a friend came to help him to the bathroom and he did have a good BM; they helped him to the car.  Came over for radiation and then went for MRI today.  Was sent from there to the ER for admission.   Assessment & Plan:   Principal Problem:   Metastatic cancer to spine [facility]) Active Problems:   Cancer of upper lobe of right lung ([place])   Diabetes mellitus ([place])   Long term current use of anticoagulant therapy   Confusion   Type 2 diabetes mellitus with hemoglobin A1c goal of 7.0%-8.0% ([place])    #1 metastatic stage IV non-small cell lung cancer diagnosed general 2016 Status post multiple treatments including chemotherapy, radiation and immunotherapy. Patient's condition has declined significantly secondary to disease progression and worsening metastases in the thoracolumbar spine leading to inability to ambulate. Patient significantly weak with some confusion and minimally responsive at this time. CT head is negative for metastatic disease of the brain. Oncology has assessed the patient and also recommending palliative care. Palliative care consultation has been obtained.  Continue supportive care, pain management. Continue IV Decadron. Per oncology no need to continue palliative radiation at this time on anticoagulation. Will hold off on any invasive procedures at this time. Palliative care have met with the family via telephone conference for goals of care today 09/21/2016 and patient currently comfort measures. Patient has been started on a morphine drip per palliative care.  #2 diabetes mellitus Stable. Monitor CBGs.  #3 recent pulmonary emboli on anticoagulation Per oncology no need to continue eliquis at this point in time and as such  discontinued.  DVT prophylaxis: SCDs Code Status: DO NOT RESUSCITATE Family Communication: Updated wife and friend at bedside. Disposition Plan: Anticipated hospital death versus Residential hospice   Consultants:   Oncology: Dr. Lorna Few 09/20/2016  Palliative care: Dr. Rowe Pavy 09/20/2016  Procedures:   CT Head 09/20/2016  Chest x-ray 09/20/2016  MRI L-spine 10/08/2016  MRI T-spine 09/18/2016  Antimicrobials:   None   Subjective: Patient opens eyes to eval post stimuli however drifts back off. Patient resting comfortably. Wife at bedside.  Objective: Vitals:   09/20/16 0608 09/20/16 1450 09/20/16 2114 09/21/16 0608  BP: 99/75 122/62 (!) 116/55 126/61  Pulse: 84 86 82 96  Resp: _0 Temp: 97 F (36.1 C) 97.6 F (36.4 C) 97.7 F (36.5 C) 98 F (36.7 C)  TempSrc: Axillary Axillary Oral Oral  SpO2: 97% 97% 93%   Weight:      Height:        Intake/Output Summary (Last 24 hours) at 09/21/16 1104 Last data filed at 09/21/16 0829  Gross per 24 hour  Intake                0 ml  Output              750 ml  Net             -750 ml   Filed Weights   10/13/2016 2007 09/20/16 0125  Weight: 61.2 kg (135 lb) 60.6 kg (133 lb 8 oz)    Examination:  General exam: Resting comfortably.Open his eyes to verbal stimuli. Respiratory system: Some coarse breath sounds anterior lung fields. Respiratory effort normal. Cardiovascular system: S1 & S2 heard, RRR. No JVD, murmurs, rubs, gallops or clicks. No pedal edema. Gastrointestinal system: Abdomen is nondistended, soft and nontender. No organomegaly or masses felt. Normal bowel sounds heard. Central nervous system: Alert and oriented. No focal neurological deficits. Extremities: Symmetric 5 x 5 power. Skin: No rashes, lesions or ulcers Psychiatry: Judgement and insight are unable to be assessed due to patient's current status.     Data Reviewed: I have personally reviewed following labs and imaging  studies  CBC:  Recent Labs Lab 10/03/2016 1744 09/20/16 0550 09/21/16 0508  WBC 13.5* 8.0 9.3  NEUTROABS 12.1*  --   --   HGB 13.4 11.3* 12.5*  HCT 39.6 34.1* 37.9*  MCV 97.5 95.5 100.0  PLT 114* 74* 62*   Basic Metabolic Panel:  Recent Labs Lab 10/09/2016 1744 09/20/16 0550 09/21/16 0508  NA 139 141 141  K 3.9 4.1 4.4  CL 106 108 110  CO2 _1 GLUCOSE 136* 121* 121*  BUN 17 21* 26*  CREATININE 0.63 0.60* 0.66  CALCIUM 8.4* 8.0* 8.1*   GFR: Estimated Creatinine Clearance: 65.2 mL/min (by C-G formula based on SCr of 0.66 mg/dL). Liver Function Tests: No results for input(s): AST, ALT, ALKPHOS, BILITOT, PROT, ALBUMIN in the last 168 hours. No results for  input(s): LIPASE, AMYLASE in the last 168 hours. No results for input(s): AMMONIA in the last 168 hours. Coagulation Profile: No results for input(s): INR, PROTIME in the last 168 hours. Cardiac Enzymes: No results for input(s): CKTOTAL, CKMB, CKMBINDEX, TROPONINI in the last 168 hours. BNP (last 3 results) No results for input(s): PROBNP in the last 8760 hours. HbA1C: No results for input(s): HGBA1C in the last 72 hours. CBG: No results for input(s): GLUCAP in the last 168 hours. Lipid Profile: No results for input(s): CHOL, HDL, LDLCALC, TRIG, CHOLHDL, LDLDIRECT in the last 72 hours. Thyroid Function Tests: No results for input(s): TSH, T4TOTAL, FREET4, T3FREE, THYROIDAB in the last 72 hours. Anemia Panel: No results for input(s): VITAMINB12, FOLATE, FERRITIN, TIBC, IRON, RETICCTPCT in the last 72 hours. Sepsis Labs: No results for input(s): PROCALCITON, LATICACIDVEN in the last 168 hours.  No results found for this or any previous visit (from the past 240 hour(s)).       Radiology Studies: Dg Chest 1 View  Result Date: 09/20/2016 CLINICAL DATA:  Unresponsive. Confusion. History of metastatic lung cancer. EXAM: CHEST 1 VIEW COMPARISON:  Chest 09/08/2016.  CT chest 09/06/2016. FINDINGS: Right-sided  power port type central venous catheter with tip over the low SVC region. Volume loss throughout the right lung with pleural fluid or thickening along the right lateral and apical chest wall. This may represent collapse and consolidation or postradiation changes. Compensatory hyperinflation of the left lung. Small left pleural effusion. Can't exclude infiltrate in the left lung base. No pneumothorax. Heart size appears normal. Heterogeneous appearance of the right glenoid could indicate a bone metastasis. IMPRESSION: Volume loss, infiltration, and fluid or pleural thickening on the right similar prior study. This may be due to collapse and consolidation or postradiation changes. Small left pleural effusion. Possible left basilar infiltration. Electronically Signed   By: [name] M.D.   On: 09/20/2016 02:28   Ct Head Wo Contrast  Result Date: 09/20/2016 CLINICAL DATA:  Confusion. Lung cancer with known spinal metastasis. EXAM: CT HEAD WITHOUT CONTRAST TECHNIQUE: Contiguous axial images were obtained from the base of the skull through the vertex without intravenous contrast. COMPARISON:  MRI brain 12/01/2015.  CT head 12/01/2015. FINDINGS: Brain: Diffuse cerebral atrophy. Ventricular dilatation consistent with central atrophy. Low-attenuation changes in the deep white matter consistent small vessel ischemia. No mass-effect or midline shift. No evidence of acute infarction, hemorrhage, hydrocephalus, extra-axial collection or mass lesion/mass effect. Vascular: Atherosclerotic vascular calcifications are present. Skull: Normal. Negative for fracture or focal lesion. Sinuses/Orbits: No acute finding. Other: No significant changes since prior study. IMPRESSION: No acute intracranial abnormalities. Chronic atrophy and small vessel ischemic changes. Electronically Signed   By: [name] M.D.   On: 09/20/2016 02:34   Mr Lumbar Spine W Wo Contrast  Result Date: 10/10/2016 CLINICAL DATA:  Unable to walk  for 3 days. History of metastatic lung cancer, recent MRI demonstrating new metastasis. EXAM: MRI LUMBAR SPINE WITHOUT AND WITH CONTRAST TECHNIQUE: Multiplanar and multiecho pulse sequences of the lumbar spine were obtained without and with intravenous contrast. CONTRAST:  73m MULTIHANCE GADOBENATE DIMEGLUMINE 529 MG/ML IV SOLN COMPARISON:  MRI thoracic spine September 18, 2016 and CT chest, abdomen and pelvis September 06, 2016 FINDINGS: Multiple sequences are moderately or severely motion degraded, axial T1 post gadolinium sequences nearly nondiagnostic. SEGMENTATION: For the purposes of this report, the last well-formed intervertebral disc will be described as L5-S1. ALIGNMENT: No malalignment.  Maintenance of the lumbar lordosis. VERTEBRAE:Lumbar vertebral bodies are intact.  Low T1, T2 bright enhancing metastasis in all lumbar vertebral bodies except L4. Expansile LEFT is sacral metastasis. Expansile RIGHT iliac metastasis with cystic, hemorrhagic invasion of the RIGHT gluteus muscles, consistent with prior CT results. No lumbosacral pathologic fracture. Mild L4-5 disc height loss with decreased T2 signal within all disc compatible with mild desiccation. Multilevel mild chronic discogenic endplate changes. Included thoracic spine demonstrates expansile metastasis T12 better characterized on recent dedicated study. CONUS MEDULLARIS: Conus medullaris terminates at L1-2 and demonstrates normal morphology and signal characteristics. Cauda equina is normal. No definite abnormal cord, leptomeningeal or epidural enhancement though motion degraded post gadolinium sequences. PARASPINAL AND SOFT TISSUES: Small amount of perihepatic ascites, increased from priors examination. Mild RIGHT iliopsoas and RIGHT paraspinal muscle denervation/interstitial edema. DISC LEVELS: L1-2: Small broad-based RIGHT central disc protrusion. Mild facet arthropathy and ligamentum flavum redundancy. No canal stenosis or neural foraminal  narrowing. L2-3: Annular bulging. Mild facet arthropathy and ligamentum flavum redundancy without canal stenosis. Minimal LEFT neural foraminal narrowing. L3-4: Annular bulging. Mild facet arthropathy and ligamentum flavum redundancy without canal stenosis. Minimal bilateral neural foraminal narrowing. L4-5: Small broad-based disc bulge asymmetric to the RIGHT could affect the exited RIGHT L4 nerve. Mild facet arthropathy and ligamentum flavum redundancy. Mild canal stenosis including partial effacement lateral recesses which could affect the traversing L5 nerves. Mild RIGHT, minimal LEFT neural foraminal narrowing. L5-S1: Small broad-based central disc protrusion. Mild facet arthropathy on the RIGHT. No canal stenosis. Moderate RIGHT, mild LEFT neural foraminal narrowing. IMPRESSION: Motion degraded examination. Lumbosacral and RIGHT iliac osseous metastasis including hemorrhagic metastasis RIGHT gluteal soft tissues. No spinal pathologic fracture. Degenerative change of lumbar spine results in mild canal stenosis L4-5. Neural foraminal narrowing L2-3 through L5-S1: Moderate on the RIGHT at L5-S1. Increasing perihepatic ascites. Electronically Signed   By: Elon Alas M.D.   On: 10/03/2016 22:48        Scheduled Meds: . dexamethasone  4 mg Intravenous Q6H  . famotidine (PEPCID) IV  20 mg Intravenous Q24H  . mouth rinse  15 mL Mouth Rinse BID  . risperiDONE  0.5 mg Oral BID  . senna-docusate  1 tablet Oral BID   Continuous Infusions: . sodium chloride 1,000 mL (09/21/16 0732)  . morphine 2 mg/hr (09/21/16 0950)     LOS: 2 days    Time spent: 18 mins    THOMPSON,DANIEL, MD [facility] Pager [phone]  If 7PM-7AM, please contact night-coverage [email or web address] Password [email or web address] 09/21/2016, 11:04 AM

## 2016-09-22 ENCOUNTER — Ambulatory Visit: Payer: Medicare Other | Admitting: Radiation Oncology

## 2016-09-22 ENCOUNTER — Ambulatory Visit: Payer: Medicare Other

## 2016-09-25 ENCOUNTER — Ambulatory Visit: Payer: Medicare Other

## 2016-09-26 ENCOUNTER — Ambulatory Visit: Payer: Medicare Other

## 2016-09-27 ENCOUNTER — Other Ambulatory Visit: Payer: Medicare Other

## 2016-09-27 ENCOUNTER — Ambulatory Visit: Payer: Medicare Other

## 2016-09-28 ENCOUNTER — Ambulatory Visit: Payer: Medicare Other

## 2016-09-29 ENCOUNTER — Ambulatory Visit: Admit: 2016-09-29 | Payer: Medicare Other

## 2016-09-29 ENCOUNTER — Ambulatory Visit: Payer: Medicare Other

## 2016-09-29 NOTE — Progress Notes (Addendum)
  Radiation Oncology         [phone] ________________________________  Name: Omar Peters MRN: [id number]  Date: 09/27/2016  DOB: [birth year]  End of Treatment Note  Diagnosis:  81 yo man with symptomatic T12 spinal metastasis from stage IV T3 N3 M1b non-small cell carcinoma of the right upper lung  Indication for treatment: Palliative  Radiation treatment dates: 09/18/16 - 09/21/2016  Site/dose: T10-L1 was treated to 6 Gy in two fractions out of a planned dose of 30 Gy, in 10 fractions at 3 Gy per fraction.  Beams/energy: 3D  //  10X, 6X  Narrative: The patient was hospitalized on 09/24/2016 due to significant decline in condition secondary to disease progression and worsening metastases in the thoracolumbar spine leading to inability to ambulate. The patient passed away while in hospital care and was unable to finish radiation treatment.  Plan: The patient has completed radiation treatment.  ________________________________  Sheral Apley. Tammi Klippel, M.D.  This document serves as a record of services personally performed by Tyler Pita, MD. It was created on his behalf by Maryla Morrow, a trained medical scribe. The creation of this record is based on the scribe's personal observations and the provider's statements to them. This document has been checked and approved by the attending provider.

## 2016-10-02 ENCOUNTER — Ambulatory Visit: Payer: Medicare Other

## 2016-10-03 ENCOUNTER — Ambulatory Visit: Payer: Medicare Other

## 2016-10-04 ENCOUNTER — Ambulatory Visit: Payer: Medicare Other | Admitting: Internal Medicine

## 2016-10-04 ENCOUNTER — Other Ambulatory Visit: Payer: Medicare Other

## 2016-10-05 ENCOUNTER — Ambulatory Visit: Payer: Medicare Other

## 2016-10-11 ENCOUNTER — Ambulatory Visit: Payer: Medicare Other

## 2016-10-11 ENCOUNTER — Other Ambulatory Visit: Payer: Medicare Other

## 2016-10-18 NOTE — Discharge Summary (Signed)
Physician Discharge Summary  [name]:[id number] DOB: 1935-08-21 DOA: 09/30/2016  PCP: Tamsen Roers, MD  Admit date: 09/17/2016 Discharge date: 09/27/2016  Admitted From: Home Disposition: In-hospital death  Discharge Condition:  Deceased  Brief/Interim Summary: Omar Buckhalter Spiveyis a 81 y.o.malewith medical history significant of HTN, GERD, macular degeneration, DM, and stage IV lung cancer (non-small cell) presenting with metastatic disease to the spine and inability to walk or stand. His wife provided the entire history, as the patient had been sedated and was comfortably sleeping for the entire duration of the encounter. She reports that the patient wasdiagnosed 12/31/14 with cancer (non-small cell lung CA). Had chemotherapy. During that time, he developed pleural fluid and had a pleural catheter placed and wife had to drain it for about3 months. Was able to discontinue Pleurx. Went on Nivolumabimmunotherapy clinical trial with improvement in cancer in lung, hip and shoulder. Had a blood clot about 3 weeks ago in right leg, with PE. Lung started filling up with fluid, then it was drained and they found cancer cells in pleural fluid. Was fairly independent until recently. He was weak and didn't feel like doing much. Then he developed further mets to the spine within the last few weeks - decided to do radiation therapy to T11-12 (mostly 12). Sunday night, he went to bed able to walk. Monday AM, he was unable to stand or walk. It got worse throughout the day yesterday. Saw [name] yesterday and had radiation therapy both yesterday and today. Last night, wife was unable to get him out of thecar, had to car a neighbor for assistance. He slept on the couch, fell off overnight and once again wife couldn't get him up. She called her niece and husband about 11pm and they came and helped him into a recliner, wife slept on couch. He was still unable to get out of recliner,  used the urinal. His feet just dangled and dragged. He slipped out of recliner and fell again and wife couldn't get him up; called neighbor again and they got him back up again. He is confused and calls her without making sense - she thinks he is dehydrated but he has not had a head CT. He also won't let her out of his sight, which is very unusual for him. He needed to have a BM so his son and a friend came to help him to the bathroom and he did have a good BM; they helped him to the car. Came over for radiation and then went for MRI today. Was sent from there to the ER for admission.  Patient is status post multiple treatments including chemotherapy, radiation and immunotherapy. Patient's condition has declined significantly secondary to disease progression and worsening metastases in the thoracolumbar spine leading to inability to ambulate. Patient became significantly weak with some confusion and decreasing level of responsiveness. CT head was negative for metastatic disease of the brain. Oncology assessed the patient and also recommended palliative care. Palliative care consultation was obtained. Per oncology no need to continue palliative radiation or anticoagulation. Patient transitioned to comfort measures. Patient was started on a morphine drip per palliative care.  He died peacefully with his wife at his bedside; his family was supportive and appreciative of the care he received.   Discharge Diagnoses:  Principal Problem:   Metastatic cancer to spine [facility] Active Problems:   Cancer of upper lobe of right lung ([place])   Diabetes mellitus ([place])   Long term current use of anticoagulant therapy  Confusion   Type 2 diabetes mellitus with hemoglobin A1c goal of 7.0%-8.0% (HCC)   Procedures/Studies: Dg Chest 1 View  Result Date: 09/20/2016 CLINICAL DATA:  Unresponsive. Confusion. History of metastatic lung cancer. EXAM: CHEST 1 VIEW COMPARISON:  Chest 09/08/2016.  CT chest 09/06/2016.  FINDINGS: Right-sided power port type central venous catheter with tip over the low SVC region. Volume loss throughout the right lung with pleural fluid or thickening along the right lateral and apical chest wall. This may represent collapse and consolidation or postradiation changes. Compensatory hyperinflation of the left lung. Small left pleural effusion. Can't exclude infiltrate in the left lung base. No pneumothorax. Heart size appears normal. Heterogeneous appearance of the right glenoid could indicate a bone metastasis. IMPRESSION: Volume loss, infiltration, and fluid or pleural thickening on the right similar prior study. This may be due to collapse and consolidation or postradiation changes. Small left pleural effusion. Possible left basilar infiltration. Electronically Signed   By: [name] M.D.   On: 09/20/2016 02:28   Dg Chest 1 View  Result Date: 09/08/2016 CLINICAL DATA:  Shortness of Breath.  Thoracentesis on Monday. EXAM: CHEST 1 VIEW COMPARISON:  CT 09/06/2016 FINDINGS: Right Port-A-Cath is in place with the tip at the cavoatrial junction. Small bilateral effusions. Diffuse airspace disease throughout the right lung. Heart is normal size. No real change since prior study. IMPRESSION: Diffuse airspace disease on the right with small bilateral effusions. No change since prior study. Electronically Signed   By: [name]   On: 09/08/2016 09:02   Dg Chest 1 View  Result Date: 09/04/2016 CLINICAL DATA:  Status post thoracentesis. EXAM: CHEST 1 VIEW COMPARISON:  08/25/2016 FINDINGS: Radiographically resolved left pleural effusion without re-expansion edema or pneumothorax. Stable extensive pleural and parenchymal opacification on the right with recent chest CT available. Normal heart size. Porta catheter on the right with tip in stable position. IMPRESSION: No acute finding after left thoracentesis. No visible remaining left pleural fluid. Electronically Signed   By: [name]   On: 09/04/2016 15:25   Dg Chest 2 View  Result Date: 08/25/2016 CLINICAL DATA:  Productive cough, shortness of breath. EXAM: CHEST  2 VIEW COMPARISON:  Radiographs of April 12, 2015. CT scan of July 12, 2016. FINDINGS: Stable cardiomediastinal silhouette. Interval placement of right internal jugular Port-A-Cath with distal tip in expected position of cavoatrial junction. No pneumothorax is noted. Mild left basilar subsegmental atelectasis is noted. Stable mild loculated right pleural effusion is noted. Right basilar opacity is noted concerning for atelectasis, edema, inflammation or possibly scarring. Bony thorax is unremarkable. IMPRESSION: Mild left basilar subsegmental atelectasis. Interval placement of right internal jugular Port-A-Cath. Stable mild loculated right pleural effusion is noted. Probable stable right basilar opacity is noted as described above. Electronically Signed   By: Marijo Conception, M.D.   On: 08/25/2016 11:35   Ct Head Wo Contrast  Result Date: 09/20/2016 CLINICAL DATA:  Confusion. Lung cancer with known spinal metastasis. EXAM: CT HEAD WITHOUT CONTRAST TECHNIQUE: Contiguous axial images were obtained from the base of the skull through the vertex without intravenous contrast. COMPARISON:  MRI brain 12/01/2015.  CT head 12/01/2015. FINDINGS: Brain: Diffuse cerebral atrophy. Ventricular dilatation consistent with central atrophy. Low-attenuation changes in the deep white matter consistent small vessel ischemia. No mass-effect or midline shift. No evidence of acute infarction, hemorrhage, hydrocephalus, extra-axial collection or mass lesion/mass effect. Vascular: Atherosclerotic vascular calcifications are present. Skull: Normal. Negative for fracture or focal lesion. Sinuses/Orbits: No  acute finding. Other: No significant changes since prior study. IMPRESSION: No acute intracranial abnormalities. Chronic atrophy and small vessel ischemic changes. Electronically Signed   By:  [name] M.D.   On: 09/20/2016 02:34   Ct Chest W Contrast  Result Date: 09/06/2016 CLINICAL DATA:  Restaging right lung cancer with bone metastases. EXAM: CT CHEST, ABDOMEN, AND PELVIS WITH CONTRAST TECHNIQUE: Multidetector CT imaging of the chest, abdomen and pelvis was performed following the standard protocol during bolus administration of intravenous contrast. CONTRAST:  16m ISOVUE-300 IOPAMIDOL (ISOVUE-300) INJECTION 61% COMPARISON:  CTA chest 08/25/2016. Chest abdomen and pelvis CT from 07/12/2016. FINDINGS: RECIST 1.1 Target Lesions: 1. Masslike opacities/architectural distortion and medial aspect right upper lobe measures 6.5 x 3.5 cm today compared to 6.6 x 3.9 cm previously (image 17 series 2). Non-target Lesions: 1. 8 mm short axis AP window lymph node is unchanged 8 mm 2. 9 mm short axis subcarinal lymph node is stable at 9 mm 3. 11 mm short axis low right paratracheal lymph node is stable 11 mm (image 22 series 2). 4. The chronic small thick walled rim enhancing right pleural effusion is stable. 5. Note nodularity left lower lobe. 6. Right iliac metastatic lesion is present. 7. Right glenoid metastatic involvement this scan visualized. CT CHEST FINDINGS Cardiovascular: Heart size normal. Coronary artery calcification is noted. No pericardial effusion Atherosclerotic calcification is noted in the wall of the thoracic aorta. Mediastinum/Nodes: Tiny bilateral thyroid nodules are stable and likely clinically insignificant. Mediastinal lymphadenopathy is unchanged. There is some probable mild circumferential wall thickening in the midesophagus, stable. Lungs/Pleura: Interstitial and airspace opacity associated with volume loss in the right hemi thorax is unchanged. There is some compressive atelectasis left lower lobe, new in the interval with new associated small left pleural effusion. If this at a.m. IIA is best appreciated in the left lung. Musculoskeletal: Lytic lesion involving the left  posterior elements at T12 shows evidence of epidural tumor involvement, as before. Advanced lytic lesion in the T11 vertebral body is stable. Metastatic disease is seen elsewhere in the thoracic spine, right shoulder and right ribs. CT ABDOMEN PELVIS FINDINGS Hepatobiliary: Inferior liver has a nodular contour are, raising the question of cirrhosis. There is no evidence for gallstones, gallbladder wall thickening, or pericholecystic fluid. Mild distention extrahepatic common duct up to 13 mm compares to 12 mm previously. Pancreas: No focal mass lesion. No dilatation of the main duct. No intraparenchymal cyst. No peripancreatic edema. Spleen: No splenomegaly. No focal mass lesion. Adrenals/Urinary Tract: No adrenal nodule or mass. Tiny hypo attenuating lesions in the kidneys bilaterally are too small to characterize but likely represent cysts. No evidence for hydroureter. The urinary bladder appears normal for the degree of distention. Stomach/Bowel: Stomach is unremarkable. Duodenum is normally positioned as is the ligament of Treitz. No small bowel wall thickening. No small bowel dilatation. The terminal ileum is normal. The appendix is normal. No gross colonic mass. No colonic wall thickening. No substantial diverticular change. Vascular/Lymphatic: There is abdominal aortic atherosclerosis without aneurysm. There is no gastrohepatic or hepatoduodenal ligament lymphadenopathy. No intraperitoneal or retroperitoneal lymphadenopathy. No pelvic sidewall lymphadenopathy. Reproductive: Prostate gland is enlarged. Other: No intraperitoneal free fluid. Musculoskeletal: Lytic lesion left iliac crest is stable. Large destructive lesion right iliac bone is unchanged. IMPRESSION: 1. Stable appearance right upper lobe lesion . 2. Stable mediastinal lymphadenopathy. 3. Stable post treatment changes right lung. The interstitial and airspace disease towards the right lower lung is indeterminate etiology but stable. This may be  related to treatment FX, but lymphangitic tumor remains a consideration. 4. Stable thick walled rim enhancing right pleural fluid collection with new free-flowing left pleural fluid on today's study. 5. Destructive bony metastatic disease in the chest, abdomen, and pelvis. 6. Changes in the liver morphology compatible with cirrhosis. 7. Coronary artery atherosclerosis. Electronically Signed   By: Misty Stanley M.D.   On: 09/06/2016 16:46   Ct Angio Chest Pe W And/or Wo Contrast  Result Date: 08/25/2016 CLINICAL DATA:  Metastatic lung cancer.  Shortness of breath. EXAM: CT ANGIOGRAPHY CHEST WITH CONTRAST TECHNIQUE: Multidetector CT imaging of the chest was performed using the standard protocol during bolus administration of intravenous contrast. Multiplanar CT image reconstructions and MIPs were obtained to evaluate the vascular anatomy. CONTRAST:  57 cc of Isovue 370 COMPARISON:  07/12/2016 RECIST 1.1 Target Lesions: 1. Mass-like area of architectural distortion in the medial aspect of the right upper lobe measures 6.6 x 3.3 cm (image 27 of series 4) previously 6.6 x 3.9 cm Non-target Lesions: 1.  7 mm short axis AP window lymph node (image 34of series 4), stable. 2. 8 mm short axis subcarinal lymph node (image 40 of series 4), previously 9 mm. 3. 11 mm short axis low right paratracheal lymph node (image 35 of series 4), stable. 5. Small chronic thick-walled rim enhancing right pleural effusion, Slightly increased in volume from previous exam, image 60 of series 4. 6. Left lower lobe pulmonary nodularity not visualized. 7. Right glenoid metastasis, present, image 18 is series 4 FINDINGS: Mediastinum: Normal heart size. No pericardial effusion. Aortic atherosclerosis noted. Calcification within the LAD and RCA coronary artery noted. The trachea appears patent and midline. Mild diffuse wall thickening involving the esophagus. The main pulmonary artery is patent. There is abnormal filling defect within the left dx  lower lobe pulmonary artery, image 120 of series 10. Lungs/Pleura: New moderate left pleural effusion with overlying compressive type atelectasis and consolidation. There has been slight increase in volume of loculated effusion overlying the posterior right lower lobe. There is progressive soft tissue consolidation and interstitial thickening involving the right mid and lower lung zone. Similar appearance of masslike consolidation overlying the right lung apex. Upper Abdomen: No focal liver abnormality. The liver has a nodular contour or and there is hypertrophy of the caudate lobe and lateral segment of left lobe. No adrenal abnormality identified. The visualized portions of the pancreas and spleen are normal. Musculoskeletal: Her mid to metastasis involving the right glenoid and scapula is again identified. Subjectively, there appears to be progression of this lesion with further loss of bone. Expansile lytic lesion involving the lateral aspect of the right 6 rib is identified with surrounding soft tissue component measures 3.4 by 2.3 cm, image 56 of series 4. On the previous exam this measured 2.1 x 3.4 cm. Lytic lesion involving the pedicle of T11 measures 3.7 cm, image 89 of series 4. Previously 3.5 cm. a left-sided canal involvement is suspected. Cannot rule out spinal stenosis at this level. Review of the MIP images confirms the above findings. IMPRESSION: 1. Examination is positive for acute pulmonary embolus to the left lower lobe. 2. New moderate volume left pleural effusion and slight increase in volume of loculated right effusion. 3. Worsening aeration to the right lung. 4. Mild progression of multi focal lytic bone metastasis 5. Aortic atherosclerosis and coronary artery disease. Critical Value/emergent results were called by telephone at the time of interpretation on 08/25/2016 at 2:12 pm to Dr. Virgel Manifold , who  verbally acknowledged these results. Electronically Signed   By: Kerby Moors M.D.   On:  08/25/2016 14:12   Mr Thoracic Spine W Wo Contrast  Result Date: 09/18/2016 CLINICAL DATA:  Weakness in legs. Bowel and bladder changes. History of lung cancer. EXAM: MRI THORACIC SPINE WITHOUT AND WITH CONTRAST TECHNIQUE: Multiplanar and multiecho pulse sequences of the thoracic spine were obtained without and with intravenous contrast. CONTRAST:  36m MULTIHANCE GADOBENATE DIMEGLUMINE 529 MG/ML IV SOLN COMPARISON:  Chest CT 09/06/2016 FINDINGS: There is normal alignment without static subluxation. There are numerous low T1 weighted signal lesions throughout the thoracic spine vertebral bodies. There is complete replacement of the normal marrow signal of the T1 and T11 vertebral bodies. There is extensive involvement of the left posterior elements at T6, T11 and T12 with associated encroachment on the left T5-6, T6-7, T11-12 and T12-L1 neural foramina. There is epidural extension of disease along the left posterior aspect of the spinal canal at the T12 level, extending into the neural foramen. There is a cystic area adjacent to the left T12 lamina. There is no spinal canal stenosis. No degenerative neural foraminal stenosis. No acute compression fracture or evidence of discitis osteomyelitis. There is a large left pleural effusion and a loculated right pleural effusion. There is incompletely visualized consolidative opacity of the right upper lobe. IMPRESSION: 1. Widespread osseous metastatic disease of the thoracic spine with left dorsal epidural extension of disease at the T12 level. 2. Tumor expansion of the posterior elements at T6, T11 and T12 with associated encroachment on the left T5-6, T6-7, T11-12 and T12-L1 neural foramina. Given differences in modalities, it is difficult to determine the degree to which the epidural disease is new/worsened since 09/06/2016. 3. Large bilateral effusions with prominent loculated component on the right. 4. Incompletely evaluated consolidative opacity of the right upper  lobe at the site of known tumor. Electronically Signed   By: KUlyses JarredM.D.   On: 09/18/2016 20:31   Mr Lumbar Spine W Wo Contrast  Result Date: 09/21/2016 CLINICAL DATA:  Unable to walk for 3 days. History of metastatic lung cancer, recent MRI demonstrating new metastasis. EXAM: MRI LUMBAR SPINE WITHOUT AND WITH CONTRAST TECHNIQUE: Multiplanar and multiecho pulse sequences of the lumbar spine were obtained without and with intravenous contrast. CONTRAST:  120mMULTIHANCE GADOBENATE DIMEGLUMINE 529 MG/ML IV SOLN COMPARISON:  MRI thoracic spine September 18, 2016 and CT chest, abdomen and pelvis September 06, 2016 FINDINGS: Multiple sequences are moderately or severely motion degraded, axial T1 post gadolinium sequences nearly nondiagnostic. SEGMENTATION: For the purposes of this report, the last well-formed intervertebral disc will be described as L5-S1. ALIGNMENT: No malalignment.  Maintenance of the lumbar lordosis. VERTEBRAE:Lumbar vertebral bodies are intact. Low T1, T2 bright enhancing metastasis in all lumbar vertebral bodies except L4. Expansile LEFT is sacral metastasis. Expansile RIGHT iliac metastasis with cystic, hemorrhagic invasion of the RIGHT gluteus muscles, consistent with prior CT results. No lumbosacral pathologic fracture. Mild L4-5 disc height loss with decreased T2 signal within all disc compatible with mild desiccation. Multilevel mild chronic discogenic endplate changes. Included thoracic spine demonstrates expansile metastasis T12 better characterized on recent dedicated study. CONUS MEDULLARIS: Conus medullaris terminates at L1-2 and demonstrates normal morphology and signal characteristics. Cauda equina is normal. No definite abnormal cord, leptomeningeal or epidural enhancement though motion degraded post gadolinium sequences. PARASPINAL AND SOFT TISSUES: Small amount of perihepatic ascites, increased from priors examination. Mild RIGHT iliopsoas and RIGHT paraspinal muscle  denervation/interstitial edema. DISC LEVELS: L1-2:  Small broad-based RIGHT central disc protrusion. Mild facet arthropathy and ligamentum flavum redundancy. No canal stenosis or neural foraminal narrowing. L2-3: Annular bulging. Mild facet arthropathy and ligamentum flavum redundancy without canal stenosis. Minimal LEFT neural foraminal narrowing. L3-4: Annular bulging. Mild facet arthropathy and ligamentum flavum redundancy without canal stenosis. Minimal bilateral neural foraminal narrowing. L4-5: Small broad-based disc bulge asymmetric to the RIGHT could affect the exited RIGHT L4 nerve. Mild facet arthropathy and ligamentum flavum redundancy. Mild canal stenosis including partial effacement lateral recesses which could affect the traversing L5 nerves. Mild RIGHT, minimal LEFT neural foraminal narrowing. L5-S1: Small broad-based central disc protrusion. Mild facet arthropathy on the RIGHT. No canal stenosis. Moderate RIGHT, mild LEFT neural foraminal narrowing. IMPRESSION: Motion degraded examination. Lumbosacral and RIGHT iliac osseous metastasis including hemorrhagic metastasis RIGHT gluteal soft tissues. No spinal pathologic fracture. Degenerative change of lumbar spine results in mild canal stenosis L4-5. Neural foraminal narrowing L2-3 through L5-S1: Moderate on the RIGHT at L5-S1. Increasing perihepatic ascites. Electronically Signed   By: Elon Alas M.D.   On: 10/16/2016 22:48   Ct Abdomen Pelvis W Contrast  Result Date: 09/06/2016 CLINICAL DATA:  Restaging right lung cancer with bone metastases. EXAM: CT CHEST, ABDOMEN, AND PELVIS WITH CONTRAST TECHNIQUE: Multidetector CT imaging of the chest, abdomen and pelvis was performed following the standard protocol during bolus administration of intravenous contrast. CONTRAST:  157m ISOVUE-300 IOPAMIDOL (ISOVUE-300) INJECTION 61% COMPARISON:  CTA chest 08/25/2016. Chest abdomen and pelvis CT from 07/12/2016. FINDINGS: RECIST 1.1 Target Lesions: 1.  Masslike opacities/architectural distortion and medial aspect right upper lobe measures 6.5 x 3.5 cm today compared to 6.6 x 3.9 cm previously (image 17 series 2). Non-target Lesions: 1. 8 mm short axis AP window lymph node is unchanged 8 mm 2. 9 mm short axis subcarinal lymph node is stable at 9 mm 3. 11 mm short axis low right paratracheal lymph node is stable 11 mm (image 22 series 2). 4. The chronic small thick walled rim enhancing right pleural effusion is stable. 5. Note nodularity left lower lobe. 6. Right iliac metastatic lesion is present. 7. Right glenoid metastatic involvement this scan visualized. CT CHEST FINDINGS Cardiovascular: Heart size normal. Coronary artery calcification is noted. No pericardial effusion Atherosclerotic calcification is noted in the wall of the thoracic aorta. Mediastinum/Nodes: Tiny bilateral thyroid nodules are stable and likely clinically insignificant. Mediastinal lymphadenopathy is unchanged. There is some probable mild circumferential wall thickening in the midesophagus, stable. Lungs/Pleura: Interstitial and airspace opacity associated with volume loss in the right hemi thorax is unchanged. There is some compressive atelectasis left lower lobe, new in the interval with new associated small left pleural effusion. If this at a.m. IIA is best appreciated in the left lung. Musculoskeletal: Lytic lesion involving the left posterior elements at T12 shows evidence of epidural tumor involvement, as before. Advanced lytic lesion in the T11 vertebral body is stable. Metastatic disease is seen elsewhere in the thoracic spine, right shoulder and right ribs. CT ABDOMEN PELVIS FINDINGS Hepatobiliary: Inferior liver has a nodular contour are, raising the question of cirrhosis. There is no evidence for gallstones, gallbladder wall thickening, or pericholecystic fluid. Mild distention extrahepatic common duct up to 13 mm compares to 12 mm previously. Pancreas: No focal mass lesion. No  dilatation of the main duct. No intraparenchymal cyst. No peripancreatic edema. Spleen: No splenomegaly. No focal mass lesion. Adrenals/Urinary Tract: No adrenal nodule or mass. Tiny hypo attenuating lesions in the kidneys bilaterally are too small to characterize but likely  represent cysts. No evidence for hydroureter. The urinary bladder appears normal for the degree of distention. Stomach/Bowel: Stomach is unremarkable. Duodenum is normally positioned as is the ligament of Treitz. No small bowel wall thickening. No small bowel dilatation. The terminal ileum is normal. The appendix is normal. No gross colonic mass. No colonic wall thickening. No substantial diverticular change. Vascular/Lymphatic: There is abdominal aortic atherosclerosis without aneurysm. There is no gastrohepatic or hepatoduodenal ligament lymphadenopathy. No intraperitoneal or retroperitoneal lymphadenopathy. No pelvic sidewall lymphadenopathy. Reproductive: Prostate gland is enlarged. Other: No intraperitoneal free fluid. Musculoskeletal: Lytic lesion left iliac crest is stable. Large destructive lesion right iliac bone is unchanged. IMPRESSION: 1. Stable appearance right upper lobe lesion . 2. Stable mediastinal lymphadenopathy. 3. Stable post treatment changes right lung. The interstitial and airspace disease towards the right lower lung is indeterminate etiology but stable. This may be related to treatment FX, but lymphangitic tumor remains a consideration. 4. Stable thick walled rim enhancing right pleural fluid collection with new free-flowing left pleural fluid on today's study. 5. Destructive bony metastatic disease in the chest, abdomen, and pelvis. 6. Changes in the liver morphology compatible with cirrhosis. 7. Coronary artery atherosclerosis. Electronically Signed   By: Misty Stanley M.D.   On: 09/06/2016 16:46   US Thoracentesis Asp Pleural Space W/img Guide  Result Date: 09/04/2016 INDICATION: Patient with history of lung  cancer, left pleural effusion; request made for diagnostic and therapeutic left thoracentesis. EXAM: ULTRASOUND GUIDED DIAGNOSTIC AND THERAPEUTIC LEFT THORACENTESIS MEDICATIONS: None. COMPLICATIONS: None immediate. PROCEDURE: An ultrasound guided thoracentesis was thoroughly discussed with the patient and questions answered. The benefits, risks, alternatives and complications were also discussed. The patient understands and wishes to proceed with the procedure. Written consent was obtained. Ultrasound was performed to localize and mark an adequate pocket of fluid in the left chest. The area was then prepped and draped in the normal sterile fashion. 1% Lidocaine was used for local anesthesia. Under ultrasound guidance a Safe-T-Centesis catheter was introduced. Thoracentesis was performed. The catheter was removed and a dressing applied. FINDINGS: A total of approximately 850 cc of slightly hazy, yellow fluid was removed. Samples were sent to the laboratory as requested by the clinical team. IMPRESSION: Successful ultrasound guided diagnostic and therapeutic left thoracentesis yielding 850 cc of pleural fluid. Read by: Rowe Robert, PA-C Electronically Signed   By: Marybelle Killings M.D.   On: 09/04/2016 14:54     Microbiology: No results found for this or any previous visit (from the past 240 hour(s)).   Labs: BNP (last 3 results)  Recent Labs  08/25/16 1313  BNP 06.3   Basic Metabolic Panel:  Recent Labs Lab 09/25/2016 1744 09/20/16 0550 09/21/16 0508  NA 139 141 141  K 3.9 4.1 4.4  CL 106 108 110  CO2 '27 29 28  '$ GLUCOSE 136* 121* 121*  BUN 17 21* 26*  CREATININE 0.63 0.60* 0.66  CALCIUM 8.4* 8.0* 8.1*   Liver Function Tests: No results for input(s): AST, ALT, ALKPHOS, BILITOT, PROT, ALBUMIN in the last 168 hours. No results for input(s): LIPASE, AMYLASE in the last 168 hours. No results for input(s): AMMONIA in the last 168 hours. CBC:  Recent Labs Lab 10/11/2016 1744 09/20/16 0550  09/21/16 0508  WBC 13.5* 8.0 9.3  NEUTROABS 12.1*  --   --   HGB 13.4 11.3* 12.5*  HCT 39.6 34.1* 37.9*  MCV 97.5 95.5 100.0  PLT 114* 74* 62*   Cardiac Enzymes: No results for input(s): CKTOTAL, CKMB, CKMBINDEX,  TROPONINI in the last 168 hours. BNP: Invalid input(s): POCBNP CBG: No results for input(s): GLUCAP in the last 168 hours. D-Dimer No results for input(s): DDIMER in the last 72 hours. Hgb A1c No results for input(s): HGBA1C in the last 72 hours. Lipid Profile No results for input(s): CHOL, HDL, LDLCALC, TRIG, CHOLHDL, LDLDIRECT in the last 72 hours. Thyroid function studies No results for input(s): TSH, T4TOTAL, T3FREE, THYROIDAB in the last 72 hours.  Invalid input(s): FREET3 Anemia work up No results for input(s): VITAMINB12, FOLATE, FERRITIN, TIBC, IRON, RETICCTPCT in the last 72 hours. Urinalysis    Component Value Date/Time   COLORURINE YELLOW 12/01/2015 2022   APPEARANCEUR CLEAR 12/01/2015 2022   LABSPEC 1.026 12/01/2015 2022   PHURINE 8.0 12/01/2015 2022   GLUCOSEU NEGATIVE 12/01/2015 2022   HGBUR NEGATIVE 12/01/2015 2022   Real NEGATIVE 12/01/2015 2022   KETONESUR NEGATIVE 12/01/2015 2022   PROTEINUR NEGATIVE 12/01/2015 2022   NITRITE NEGATIVE 12/01/2015 2022   LEUKOCYTESUR NEGATIVE 12/01/2015 2022   Sepsis Labs Invalid input(s): PROCALCITONIN,  WBC,  LACTICIDVEN Microbiology No results found for this or any previous visit (from the past 240 hour(s)).   Time coordinating discharge: Less than 30 minutes  SIGNED:   Karmen Bongo, MD  [facility] 2016/10/13, 2:12 AM Pager   If 7PM-7AM, please contact night-coverage [email or web address] Password [email or web address]

## 2016-10-18 DEATH — deceased

## 2016-11-03 IMAGING — CR DG CHEST 2V
2 series · 2 of 2 positions shown · non-contrast
Comparison: None.

CLINICAL DATA: Shortness of breath and cough.

EXAM:
CHEST  2 VIEW

[view not recorded (1 of 2)]
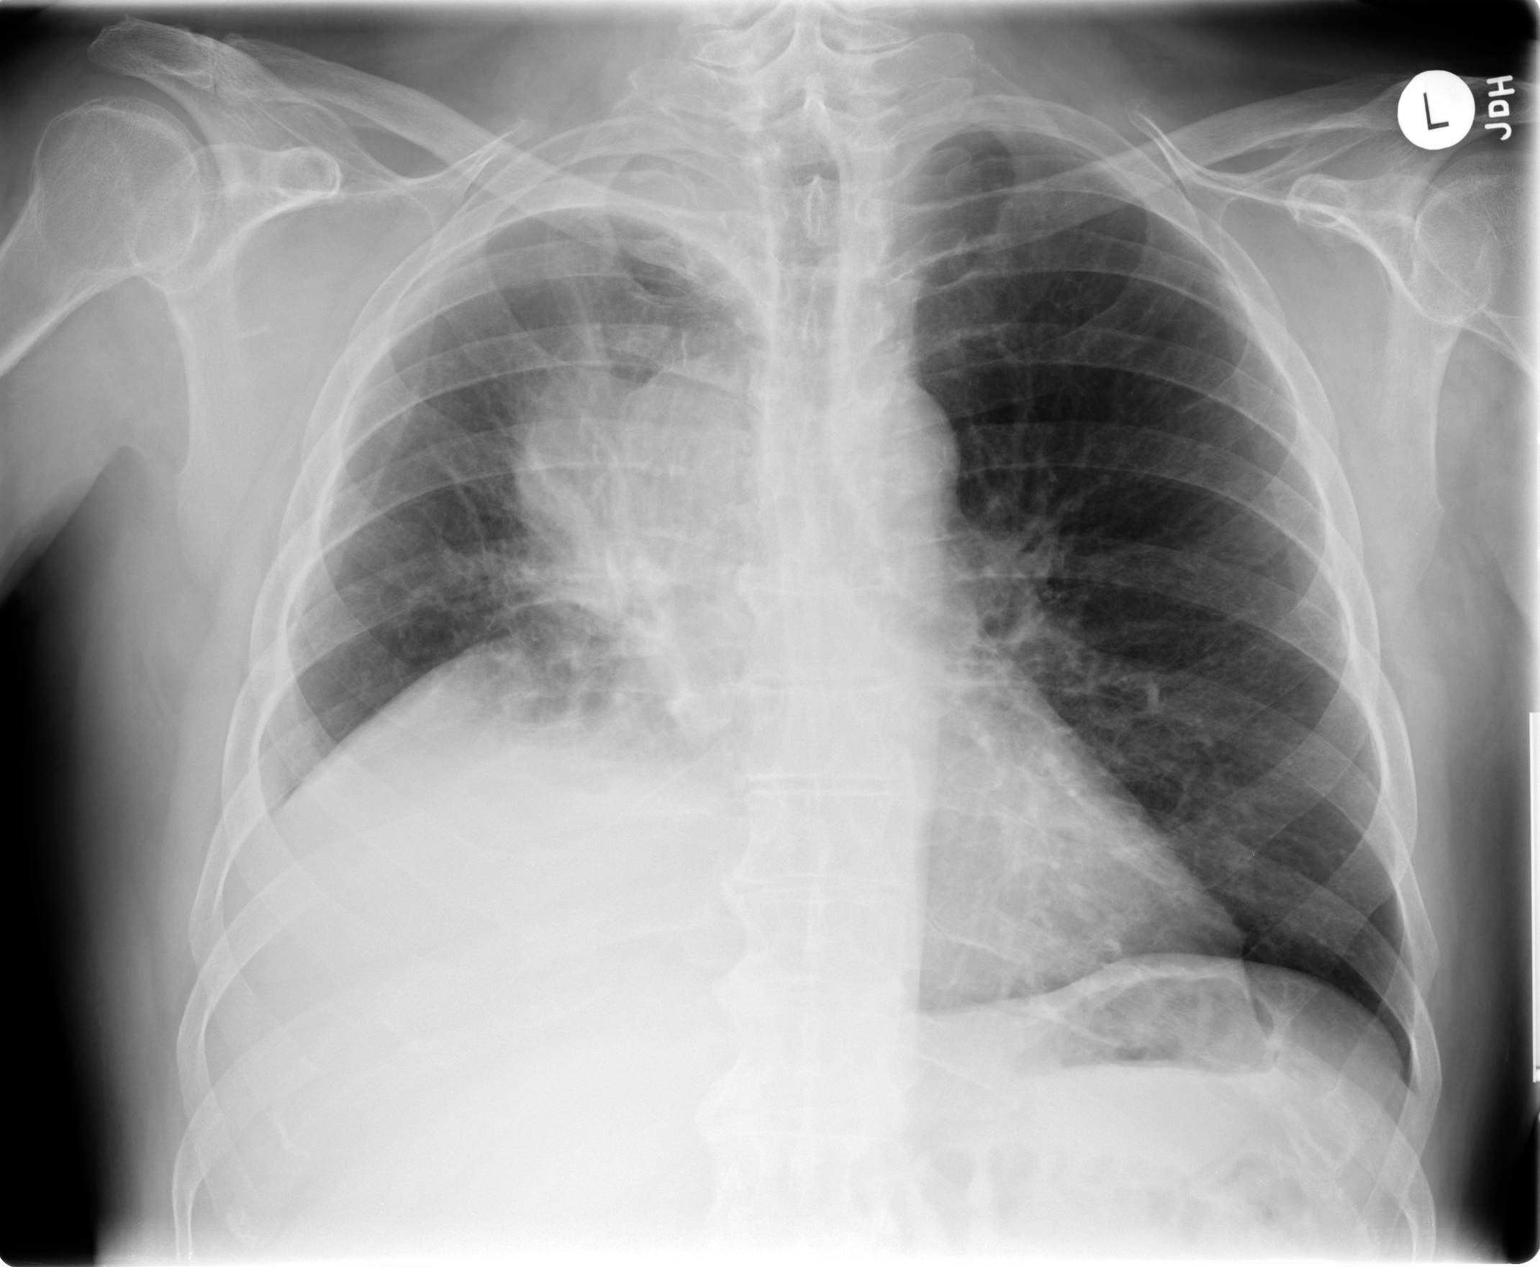

[view not recorded (2 of 2)]
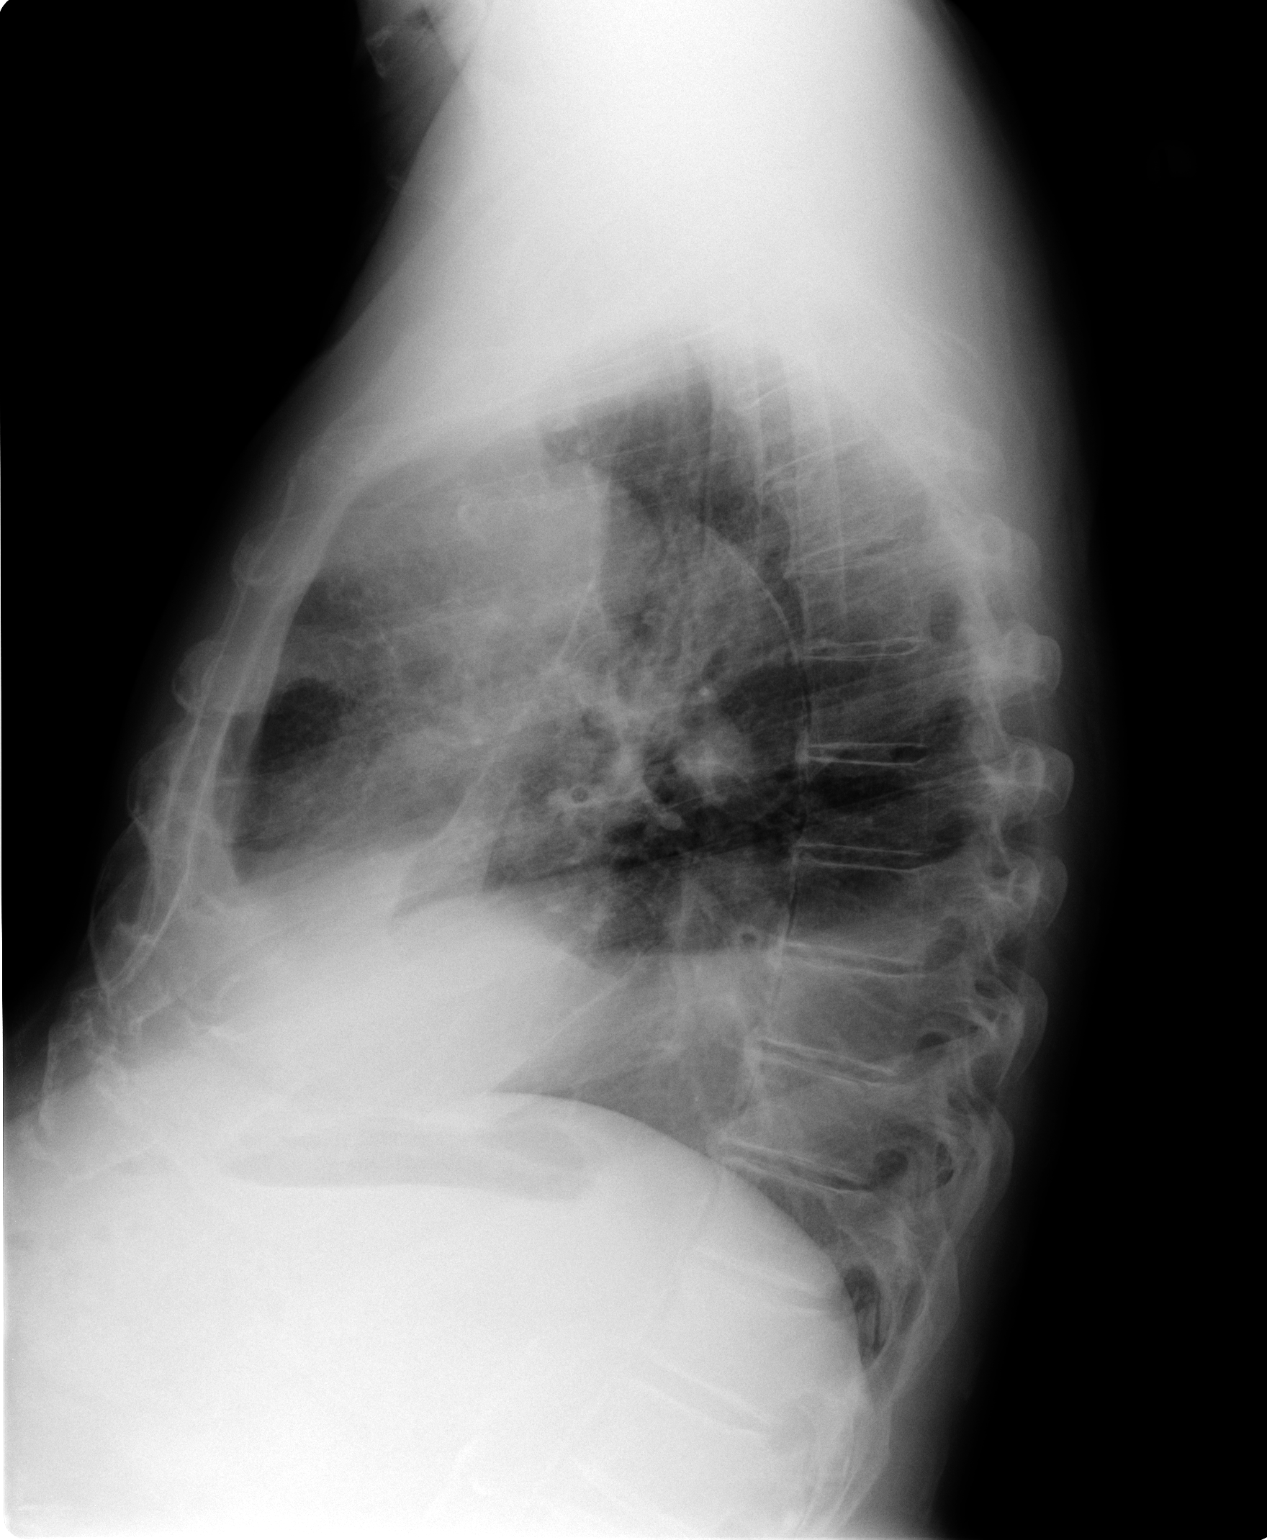

[2 of 2 positions shown; findings below may reference images not displayed]

FINDINGS: Right perihilar mass and/or infiltrate. Associated prominence of the
mediastinum suggesting adenopathy. Right base atelectasis. Right
apical pleural thickening. These findings together worrisome for
malignancy. Contrast-enhanced chest CT suggested to further
evaluate. Left lung clear. Heart size normal. No acute bony
abnormality.
IMPRESSION: Right perihilar mass and or infiltrate with possible mediastinal
adenopathy. Right base atelectasis. Prominent right apical pleural
thickening. These findings together are worrisome for malignancy and
contrast-enhanced chest CT suggested to further evaluate. These
results will be called to the ordering clinician or representative
by the Radiologist Assistant, and communication documented in the
PACS or zVision Dashboard.

## 2016-12-01 ENCOUNTER — Other Ambulatory Visit: Payer: Self-pay | Admitting: Nurse Practitioner

## 2017-08-25 IMAGING — CT CT ABD-PELV W/ CM
2 of 5 series · 14 of 46 positions shown, 16 images · IV contrast (omnipaque)
Comparison: CTs 08/11/2015 and 05/24/2015.

ADDENDUM:
There is a speech recognition error under the target lesions. Target
lesion 1 currently measures 3.8 x 3.1 cm, as reported in the main
finding section.
CLINICAL DATA: Lung cancer restaging. Chemotherapy ongoing. Patient
reports coughing, tinged with blood and constipation. Recist
protocol. Subsequent encounter Prior

EXAM:
CT CHEST, ABDOMEN, AND PELVIS WITH CONTRAST
TECHNIQUE: Multidetector CT imaging of the chest, abdomen and pelvis was
performed following the standard protocol during bolus
administration of intravenous contrast.
CONTRAST:  100mL OMNIPAQUE IOHEXOL 300 MG/ML  SOLN

[Series 2: cap with st · axial · 0.74mm/px · z∈[-622,-62]mm · 11 of 128 slices shown, 13 images]
[im 8/128  soft-tissue]
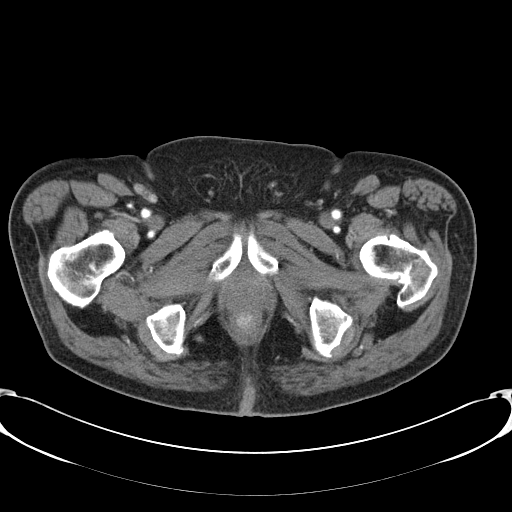
[im 8/128  bone]
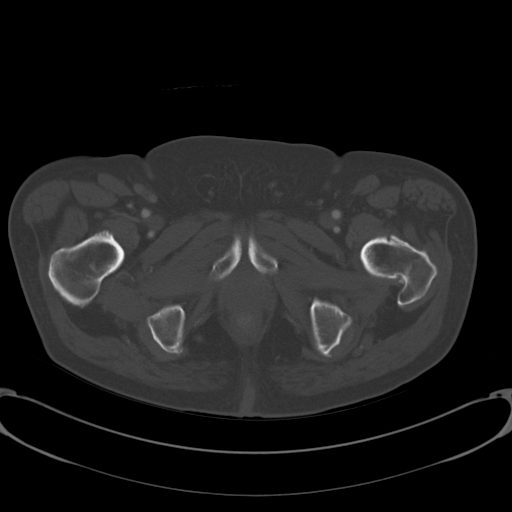
[im 23/128  soft-tissue]
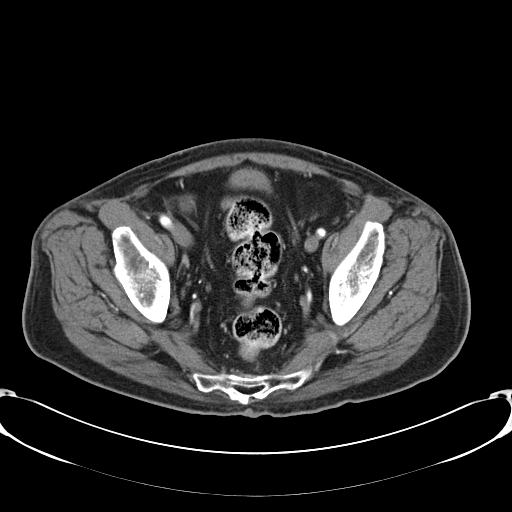
[im 30/128  soft-tissue]
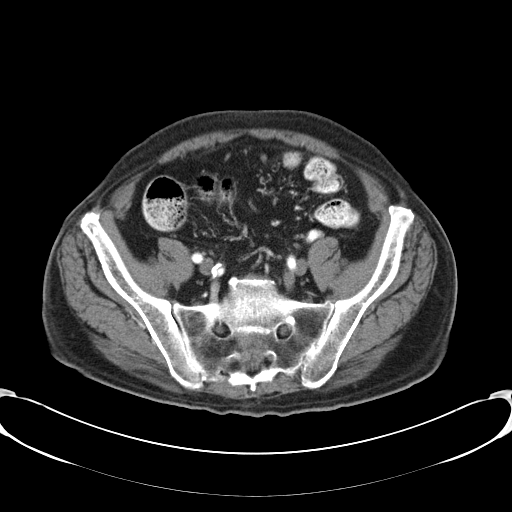
[im 45/128  soft-tissue]
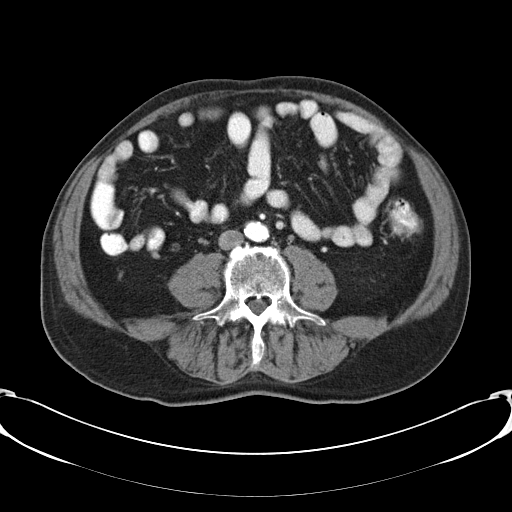
[im 53/128  soft-tissue]
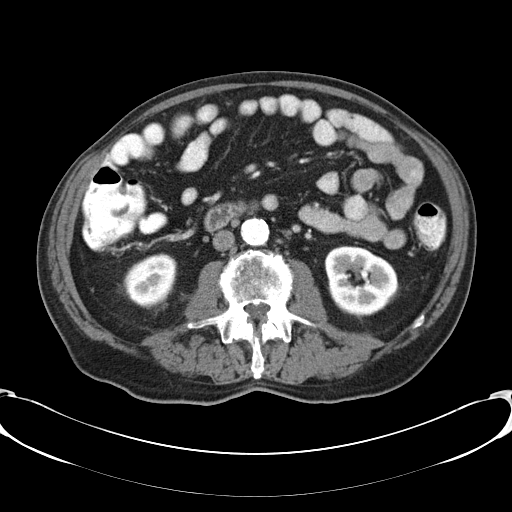
[im 68/128  soft-tissue]
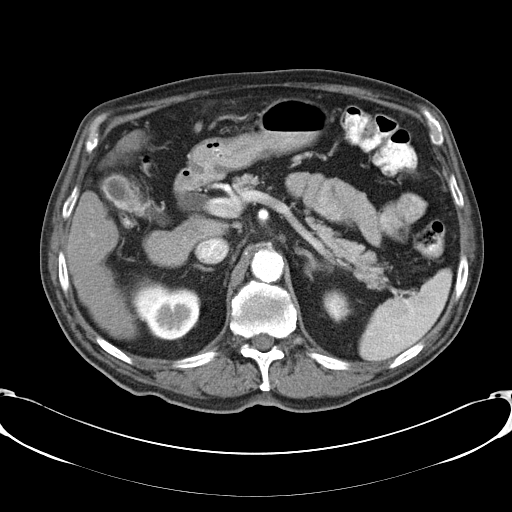
[im 75/128  soft-tissue]
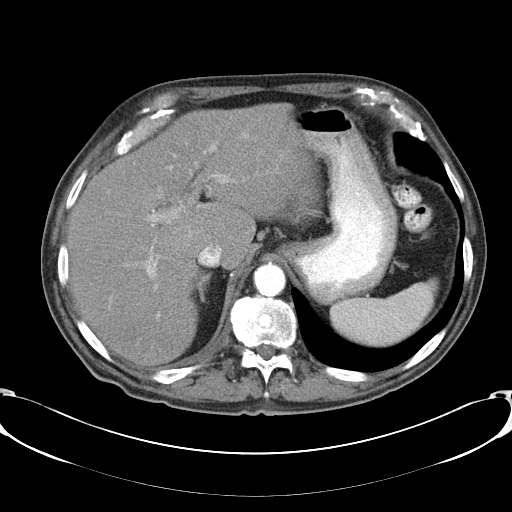
[im 83/128  soft-tissue]
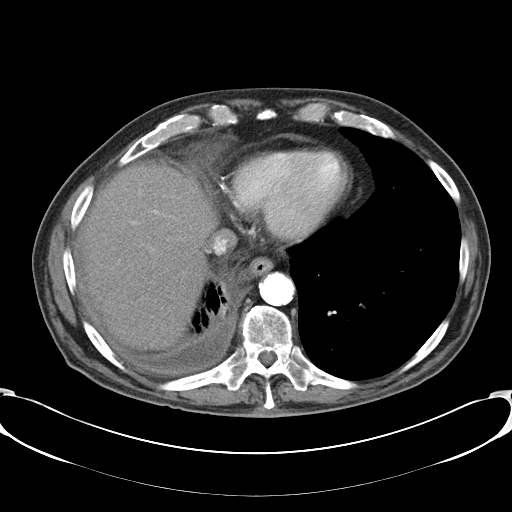
[im 98/128  soft-tissue]
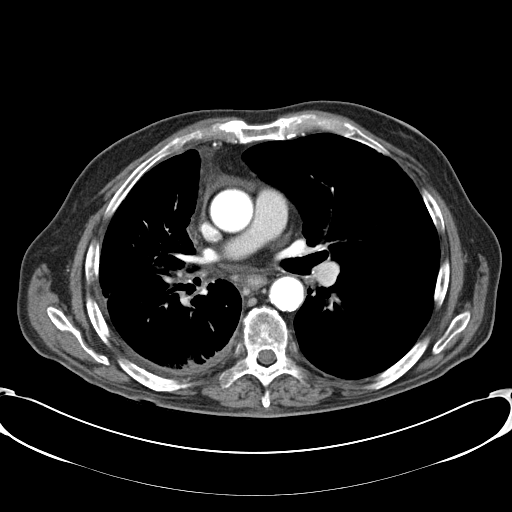
[im 98/128  bone]
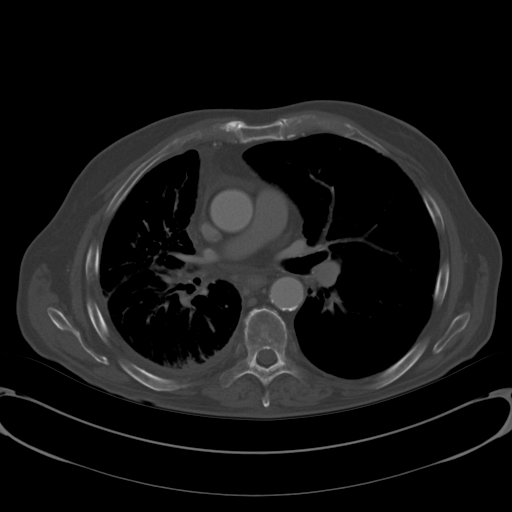
[im 105/128  soft-tissue]
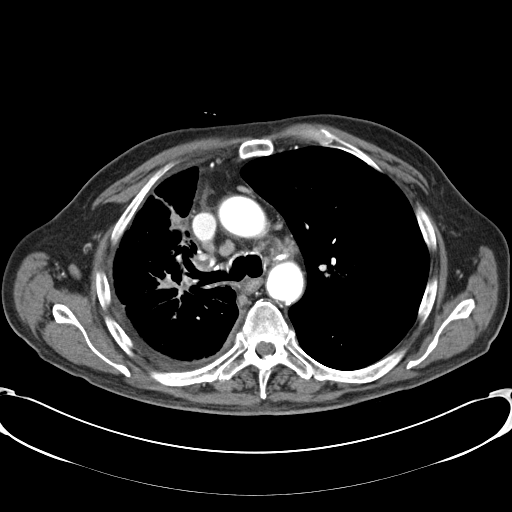
[im 120/128  soft-tissue]
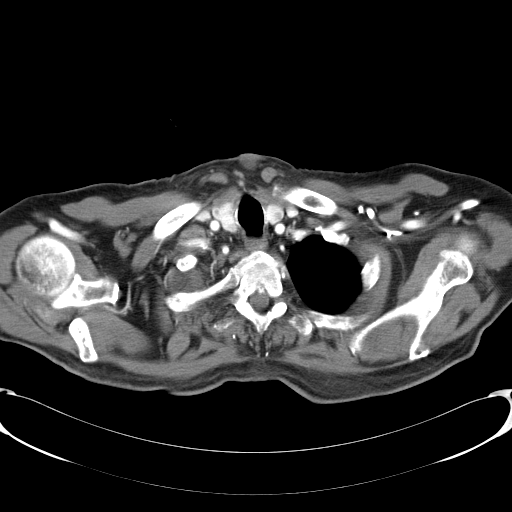

[Series 602: <mpr thick range> · coronal · 1.24mm/px · 3 of 90 slices shown]
[im 30/90  soft-tissue]
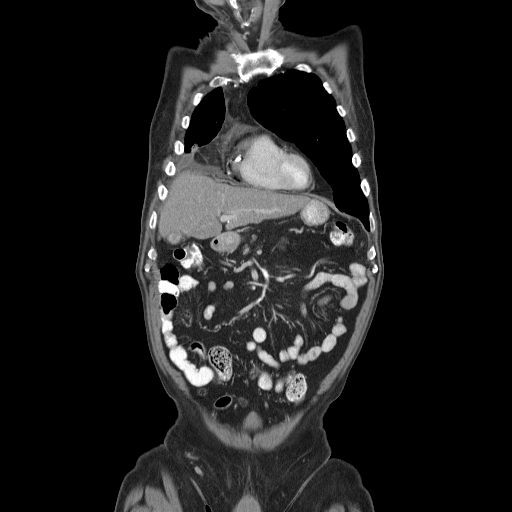
[im 40/90  soft-tissue]
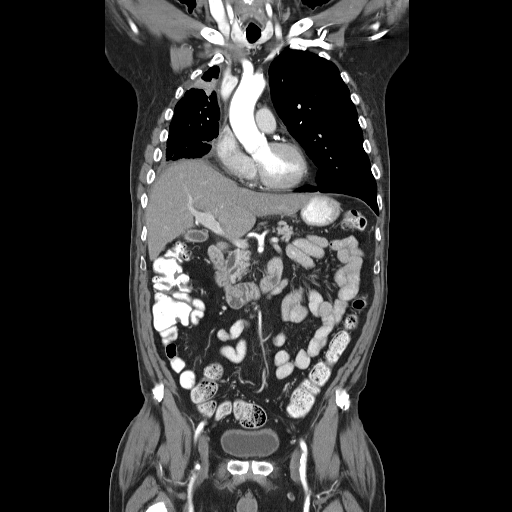
[im 50/90  soft-tissue]
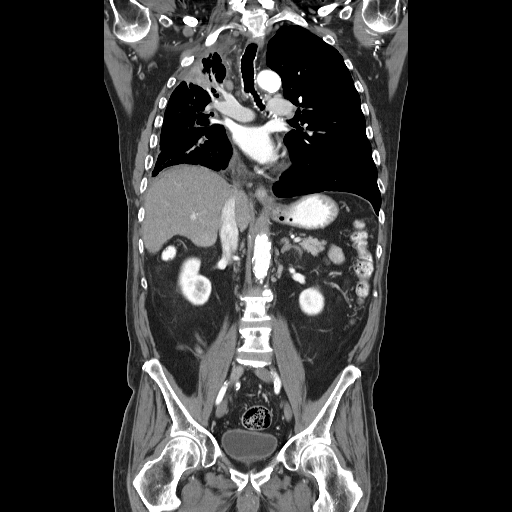

[14 of 46 positions shown; findings below may reference images not displayed]

FINDINGS: RECIST

Target Lesions:

1. The right upper lobe lesion is suboptimally evaluated, now
partially obscured by adjacent pneumonitis or atelectasis. It
measures approximately 3.0 x 3.1 cm (4.0 x 4.7 cm previously).
2. The right iliac bone lesion appears larger, especially its lytic
component. This lesion measures up to 3.9 cm on image 92. The
progression is most obvious on the coronal images.
Non-target Lesions:

1. 11 mm AP window node on image 25 does not appear significantly
changed.

CT CHEST FINDINGS

Mediastinum/Nodes: Mildly prominent mediastinal lymph nodes have not
significantly changed, including a 7 mm right paratracheal node on
image 24 and an 11 mm AP window node on image 25. Mildly prominent
right hilar lymph nodes are also stable, likely reactive. There is
stable mild thyroid nodularity. Mid to distal esophageal wall
thickening attributed to radiation. The heart size is normal. There
is no pericardial effusion. There is diffuse atherosclerosis of the
aorta, great vessels and coronary arteries. The SVC is patent.

Lungs/Pleura: Loculated pleural fluid on the right appears
unchanged. There is no significant left pleural effusion. As above,
the residual right upper lobe mass is partially obscured by adjacent
postobstructive pneumonitis or atelectasis, but appears slightly
smaller, measuring approximately 3.8 x 3.1 cm. There is stable right
hilar distortion and peribronchial thickening on the right without
discrete nodularity. Dependent atelectasis is present in the left
lower lobe. No new or enlarging pulmonary nodules are identified.
Minimal left lower lobe nodularity on image 44 is stable. There are
diffuse emphysematous changes.

Musculoskeletal/Chest wall: Pretracheal subcutaneous nodule with a
central low density in the lower neck is unchanged, measuring 14 x
17 mm on image 8. No chest wall mass identified. Bilateral
gynecomastia noted. The previously noted sclerotic lesion within the
T11 vertebral body is less dense, but not significantly changed in
size, measuring 10 mm. No new osseous lesions identified.

CT ABDOMEN AND PELVIS FINDINGS

Hepatobiliary: The liver appears stable with mild contour
irregularity and ill-defined low-density anteriorly in the left
lobe. No discrete focal lesions observed. There is mild diffuse
gallbladder wall enhancement without focal abnormality. Mild
extrahepatic biliary dilatation appears unchanged.

Pancreas: Unremarkable. No pancreatic ductal dilatation or
surrounding inflammatory changes.

Spleen: Normal in size without focal abnormality.

Adrenals/Urinary Tract: Both adrenal glands appear normal. Small
right renal cysts are noted. The left kidney appears normal. There
is no hydronephrosis or urinary tract calculus. The bladder appears
unremarkable.

Stomach/Bowel: No evidence of bowel wall thickening, distention or
surrounding inflammatory change. The appendix appears normal.

Vascular/Lymphatic: There are no enlarged abdominal or pelvic lymph
nodes. There is diffuse atherosclerosis of the aorta, its branches
and the iliac arteries.

Reproductive: Stable moderate enlargement of the prostate gland with
central dystrophic calcifications.

Other: No evidence of abdominal wall mass or hernia.

Musculoskeletal: The lytic lesion involving the right iliac bone has
enlarged, measuring up to 3.9 cm transverse and 3.5 cm
cephalocaudad. No pathologic fracture identified. No other osseous
lesions seen.
IMPRESSION: 1. Mildly increased parenchymal opacity peripheral to the right
upper lobe lesion, most consistent with postobstructive
pneumonitis/atelectasis. The right upper lobe mass appears slightly
smaller.
2. No disease progression identified within the chest.
3. Enlarging lytic lesion in the right iliac bone consistent with
progressive metastatic disease. The T11 lesion is grossly stable in
size.
4. No extra osseous metastases identified in the abdomen or pelvis.
